# Patient Record
Sex: Female | Born: 1965 | Race: White | Hispanic: Yes | Marital: Married | Smoking: Never smoker
Health system: Southern US, Community
[De-identification: ages and names within clinical notes are randomized; demographics above are authoritative.]

## PROBLEM LIST (undated history)

## (undated) DIAGNOSIS — K2970 Gastritis, unspecified, without bleeding: Secondary | ICD-10-CM

## (undated) DIAGNOSIS — M6530 Trigger finger, unspecified finger: Secondary | ICD-10-CM

## (undated) DIAGNOSIS — H8109 Meniere's disease, unspecified ear: Secondary | ICD-10-CM

## (undated) DIAGNOSIS — K760 Fatty (change of) liver, not elsewhere classified: Secondary | ICD-10-CM

## (undated) DIAGNOSIS — I8390 Asymptomatic varicose veins of unspecified lower extremity: Secondary | ICD-10-CM

## (undated) DIAGNOSIS — F410 Panic disorder [episodic paroxysmal anxiety] without agoraphobia: Secondary | ICD-10-CM

## (undated) DIAGNOSIS — E7800 Pure hypercholesterolemia, unspecified: Secondary | ICD-10-CM

## (undated) DIAGNOSIS — K219 Gastro-esophageal reflux disease without esophagitis: Secondary | ICD-10-CM

## (undated) DIAGNOSIS — M2170 Unequal limb length (acquired), unspecified site: Secondary | ICD-10-CM

## (undated) DIAGNOSIS — I10 Essential (primary) hypertension: Secondary | ICD-10-CM

## (undated) DIAGNOSIS — R6889 Other general symptoms and signs: Secondary | ICD-10-CM

## (undated) DIAGNOSIS — H8110 Benign paroxysmal vertigo, unspecified ear: Secondary | ICD-10-CM

## (undated) DIAGNOSIS — H9190 Unspecified hearing loss, unspecified ear: Secondary | ICD-10-CM

## (undated) DIAGNOSIS — F419 Anxiety disorder, unspecified: Secondary | ICD-10-CM

## (undated) DIAGNOSIS — E785 Hyperlipidemia, unspecified: Secondary | ICD-10-CM

## (undated) DIAGNOSIS — M2140 Flat foot [pes planus] (acquired), unspecified foot: Secondary | ICD-10-CM

## (undated) DIAGNOSIS — R5383 Other fatigue: Secondary | ICD-10-CM

## (undated) DIAGNOSIS — E119 Type 2 diabetes mellitus without complications: Secondary | ICD-10-CM

## (undated) DIAGNOSIS — M722 Plantar fascial fibromatosis: Secondary | ICD-10-CM

## (undated) DIAGNOSIS — D179 Benign lipomatous neoplasm, unspecified: Secondary | ICD-10-CM

## (undated) DIAGNOSIS — F411 Generalized anxiety disorder: Secondary | ICD-10-CM

## (undated) DIAGNOSIS — N926 Irregular menstruation, unspecified: Secondary | ICD-10-CM

## (undated) DIAGNOSIS — K430 Incisional hernia with obstruction, without gangrene: Secondary | ICD-10-CM

## (undated) DIAGNOSIS — R42 Dizziness and giddiness: Secondary | ICD-10-CM

## (undated) DIAGNOSIS — D500 Iron deficiency anemia secondary to blood loss (chronic): Secondary | ICD-10-CM

## (undated) DIAGNOSIS — B351 Tinea unguium: Secondary | ICD-10-CM

## (undated) DIAGNOSIS — N920 Excessive and frequent menstruation with regular cycle: Secondary | ICD-10-CM

## (undated) DIAGNOSIS — L089 Local infection of the skin and subcutaneous tissue, unspecified: Secondary | ICD-10-CM

## (undated) DIAGNOSIS — M5431 Sciatica, right side: Secondary | ICD-10-CM

## (undated) DIAGNOSIS — M2550 Pain in unspecified joint: Secondary | ICD-10-CM

## (undated) DIAGNOSIS — E1369 Other specified diabetes mellitus with other specified complication: Secondary | ICD-10-CM

## (undated) HISTORY — DX: Other fatigue: R53.83

## (undated) HISTORY — DX: Fatty (change of) liver, not elsewhere classified: K76.0

## (undated) HISTORY — DX: Other specified diabetes mellitus with other specified complication: E13.69

## (undated) HISTORY — DX: Anxiety disorder, unspecified: F41.9

## (undated) HISTORY — DX: Unspecified hearing loss, unspecified ear: H91.90

## (undated) HISTORY — DX: Panic disorder (episodic paroxysmal anxiety): F41.0

## (undated) HISTORY — DX: Other general symptoms and signs: R68.89

## (undated) HISTORY — DX: Sciatica, right side: M54.31

## (undated) HISTORY — DX: Benign paroxysmal vertigo, unspecified ear: H81.10

## (undated) HISTORY — DX: Unequal limb length (acquired), unspecified site: M21.70

## (undated) HISTORY — DX: Gastro-esophageal reflux disease without esophagitis: K21.9

## (undated) HISTORY — DX: Type 2 diabetes mellitus without complications: E11.9

## (undated) HISTORY — DX: Hyperlipidemia, unspecified: E78.5

## (undated) HISTORY — DX: Meniere's disease, unspecified ear: H81.09

## (undated) HISTORY — DX: Excessive and frequent menstruation with regular cycle: N92.0

## (undated) HISTORY — DX: Plantar fascial fibromatosis: M72.2

## (undated) HISTORY — DX: Trigger finger, unspecified finger: M65.30

## (undated) HISTORY — DX: Pure hypercholesterolemia, unspecified: E78.00

## (undated) HISTORY — DX: Irregular menstruation, unspecified: N92.6

## (undated) HISTORY — DX: Generalized anxiety disorder: F41.1

## (undated) HISTORY — DX: Benign lipomatous neoplasm, unspecified: D17.9

## (undated) HISTORY — PX: TUBAL LIGATION: SHX77

## (undated) HISTORY — DX: Flat foot (pes planus) (acquired), unspecified foot: M21.40

## (undated) HISTORY — DX: Dizziness and giddiness: R42

## (undated) HISTORY — DX: Incisional hernia with obstruction, without gangrene: K43.0

## (undated) HISTORY — DX: Local infection of the skin and subcutaneous tissue, unspecified: L08.9

## (undated) HISTORY — DX: Asymptomatic varicose veins of unspecified lower extremity: I83.90

## (undated) HISTORY — DX: Pain in unspecified joint: M25.50

## (undated) HISTORY — DX: Iron deficiency anemia secondary to blood loss (chronic): D50.0

## (undated) HISTORY — DX: Tinea unguium: B35.1

---

## 1998-06-08 ENCOUNTER — Other Ambulatory Visit: Admission: RE | Admit: 1998-06-08 | Discharge: 1998-06-08 | Payer: Self-pay | Admitting: Obstetrics and Gynecology

## 1998-09-30 ENCOUNTER — Emergency Department (HOSPITAL_COMMUNITY): Admission: EM | Admit: 1998-09-30 | Discharge: 1998-09-30 | Payer: Self-pay | Admitting: Emergency Medicine

## 1999-01-11 ENCOUNTER — Inpatient Hospital Stay (HOSPITAL_COMMUNITY): Admission: AD | Admit: 1999-01-11 | Discharge: 1999-01-16 | Payer: Self-pay | Admitting: Obstetrics & Gynecology

## 1999-01-17 ENCOUNTER — Inpatient Hospital Stay (HOSPITAL_COMMUNITY): Admission: AD | Admit: 1999-01-17 | Discharge: 1999-01-17 | Payer: Self-pay | Admitting: Obstetrics & Gynecology

## 1999-01-18 ENCOUNTER — Inpatient Hospital Stay (HOSPITAL_COMMUNITY): Admission: AD | Admit: 1999-01-18 | Discharge: 1999-01-18 | Payer: Self-pay | Admitting: Obstetrics

## 1999-01-22 ENCOUNTER — Inpatient Hospital Stay (HOSPITAL_COMMUNITY): Admission: AD | Admit: 1999-01-22 | Discharge: 1999-01-22 | Payer: Self-pay | Admitting: *Deleted

## 1999-01-23 ENCOUNTER — Inpatient Hospital Stay (HOSPITAL_COMMUNITY): Admission: AD | Admit: 1999-01-23 | Discharge: 1999-01-23 | Payer: Self-pay | Admitting: *Deleted

## 1999-01-25 ENCOUNTER — Encounter (HOSPITAL_COMMUNITY): Admission: AD | Admit: 1999-01-25 | Discharge: 1999-04-25 | Payer: Self-pay | Admitting: *Deleted

## 1999-01-31 ENCOUNTER — Inpatient Hospital Stay (HOSPITAL_COMMUNITY): Admission: AD | Admit: 1999-01-31 | Discharge: 1999-01-31 | Payer: Self-pay | Admitting: Obstetrics

## 1999-10-19 ENCOUNTER — Emergency Department (HOSPITAL_COMMUNITY): Admission: EM | Admit: 1999-10-19 | Discharge: 1999-10-19 | Payer: Self-pay | Admitting: Emergency Medicine

## 2002-05-16 ENCOUNTER — Emergency Department (HOSPITAL_COMMUNITY): Admission: EM | Admit: 2002-05-16 | Discharge: 2002-05-16 | Payer: Self-pay | Admitting: Emergency Medicine

## 2002-05-16 ENCOUNTER — Encounter: Payer: Self-pay | Admitting: Emergency Medicine

## 2002-05-20 ENCOUNTER — Encounter: Admission: RE | Admit: 2002-05-20 | Discharge: 2002-05-20 | Payer: Self-pay | Admitting: Internal Medicine

## 2002-05-27 ENCOUNTER — Encounter: Admission: RE | Admit: 2002-05-27 | Discharge: 2002-05-27 | Payer: Self-pay | Admitting: Internal Medicine

## 2002-06-18 ENCOUNTER — Encounter: Admission: RE | Admit: 2002-06-18 | Discharge: 2002-06-18 | Payer: Self-pay | Admitting: Internal Medicine

## 2002-06-26 ENCOUNTER — Encounter: Admission: RE | Admit: 2002-06-26 | Discharge: 2002-06-26 | Payer: Self-pay | Admitting: Internal Medicine

## 2002-07-08 ENCOUNTER — Encounter: Admission: RE | Admit: 2002-07-08 | Discharge: 2002-10-06 | Payer: Self-pay | Admitting: *Deleted

## 2002-07-08 ENCOUNTER — Encounter: Admission: RE | Admit: 2002-07-08 | Discharge: 2002-07-08 | Payer: Self-pay | Admitting: Internal Medicine

## 2002-08-28 ENCOUNTER — Encounter: Admission: RE | Admit: 2002-08-28 | Discharge: 2002-08-28 | Payer: Self-pay | Admitting: Internal Medicine

## 2002-08-28 ENCOUNTER — Encounter: Admission: RE | Admit: 2002-08-28 | Discharge: 2002-08-28 | Payer: Self-pay | Admitting: *Deleted

## 2002-09-02 ENCOUNTER — Ambulatory Visit (HOSPITAL_COMMUNITY): Admission: RE | Admit: 2002-09-02 | Discharge: 2002-09-02 | Payer: Self-pay | Admitting: *Deleted

## 2002-09-04 ENCOUNTER — Encounter: Admission: RE | Admit: 2002-09-04 | Discharge: 2002-09-04 | Payer: Self-pay | Admitting: *Deleted

## 2002-09-10 ENCOUNTER — Ambulatory Visit (HOSPITAL_COMMUNITY): Admission: RE | Admit: 2002-09-10 | Discharge: 2002-09-10 | Payer: Self-pay | Admitting: *Deleted

## 2002-09-11 ENCOUNTER — Encounter: Admission: RE | Admit: 2002-09-11 | Discharge: 2002-09-11 | Payer: Self-pay | Admitting: *Deleted

## 2002-09-18 ENCOUNTER — Encounter: Admission: RE | Admit: 2002-09-18 | Discharge: 2002-09-18 | Payer: Self-pay | Admitting: *Deleted

## 2002-09-18 ENCOUNTER — Encounter: Admission: RE | Admit: 2002-09-18 | Discharge: 2002-12-17 | Payer: Self-pay | Admitting: *Deleted

## 2002-09-23 ENCOUNTER — Ambulatory Visit (HOSPITAL_COMMUNITY): Admission: RE | Admit: 2002-09-23 | Discharge: 2002-09-23 | Payer: Self-pay | Admitting: *Deleted

## 2002-09-25 ENCOUNTER — Encounter: Admission: RE | Admit: 2002-09-25 | Discharge: 2002-09-25 | Payer: Self-pay | Admitting: *Deleted

## 2002-10-09 ENCOUNTER — Encounter: Admission: RE | Admit: 2002-10-09 | Discharge: 2002-10-09 | Payer: Self-pay | Admitting: *Deleted

## 2002-10-16 ENCOUNTER — Encounter: Admission: RE | Admit: 2002-10-16 | Discharge: 2002-10-16 | Payer: Self-pay | Admitting: *Deleted

## 2002-11-01 ENCOUNTER — Inpatient Hospital Stay (HOSPITAL_COMMUNITY): Admission: AD | Admit: 2002-11-01 | Discharge: 2002-11-01 | Payer: Self-pay | Admitting: Obstetrics and Gynecology

## 2002-11-06 ENCOUNTER — Encounter: Admission: RE | Admit: 2002-11-06 | Discharge: 2002-11-06 | Payer: Self-pay | Admitting: *Deleted

## 2002-11-20 ENCOUNTER — Encounter: Admission: RE | Admit: 2002-11-20 | Discharge: 2002-11-20 | Payer: Self-pay | Admitting: *Deleted

## 2002-11-28 ENCOUNTER — Encounter: Admission: RE | Admit: 2002-11-28 | Discharge: 2002-11-28 | Payer: Self-pay | Admitting: *Deleted

## 2002-12-03 ENCOUNTER — Ambulatory Visit (HOSPITAL_COMMUNITY): Admission: RE | Admit: 2002-12-03 | Discharge: 2002-12-03 | Payer: Self-pay | Admitting: Obstetrics and Gynecology

## 2002-12-05 ENCOUNTER — Encounter: Admission: RE | Admit: 2002-12-05 | Discharge: 2002-12-05 | Payer: Self-pay | Admitting: *Deleted

## 2002-12-12 ENCOUNTER — Encounter: Admission: RE | Admit: 2002-12-12 | Discharge: 2002-12-12 | Payer: Self-pay | Admitting: *Deleted

## 2002-12-19 ENCOUNTER — Encounter: Admission: RE | Admit: 2002-12-19 | Discharge: 2002-12-19 | Payer: Self-pay | Admitting: *Deleted

## 2002-12-26 ENCOUNTER — Ambulatory Visit (HOSPITAL_COMMUNITY): Admission: RE | Admit: 2002-12-26 | Discharge: 2002-12-26 | Payer: Self-pay | Admitting: *Deleted

## 2002-12-26 ENCOUNTER — Encounter: Admission: RE | Admit: 2002-12-26 | Discharge: 2002-12-26 | Payer: Self-pay | Admitting: *Deleted

## 2003-01-02 ENCOUNTER — Encounter: Admission: RE | Admit: 2003-01-02 | Discharge: 2003-01-02 | Payer: Self-pay | Admitting: *Deleted

## 2003-01-09 ENCOUNTER — Encounter: Admission: RE | Admit: 2003-01-09 | Discharge: 2003-01-09 | Payer: Self-pay | Admitting: *Deleted

## 2003-01-09 ENCOUNTER — Encounter (HOSPITAL_COMMUNITY): Admission: RE | Admit: 2003-01-09 | Discharge: 2003-02-03 | Payer: Self-pay | Admitting: Family Medicine

## 2003-01-16 ENCOUNTER — Encounter: Admission: RE | Admit: 2003-01-16 | Discharge: 2003-01-16 | Payer: Self-pay | Admitting: *Deleted

## 2003-01-23 ENCOUNTER — Encounter: Admission: RE | Admit: 2003-01-23 | Discharge: 2003-01-23 | Payer: Self-pay | Admitting: *Deleted

## 2003-01-28 ENCOUNTER — Encounter: Payer: Self-pay | Admitting: *Deleted

## 2003-01-28 ENCOUNTER — Encounter: Admission: RE | Admit: 2003-01-28 | Discharge: 2003-01-28 | Payer: Self-pay | Admitting: *Deleted

## 2003-01-30 ENCOUNTER — Encounter: Admission: RE | Admit: 2003-01-30 | Discharge: 2003-01-30 | Payer: Self-pay | Admitting: *Deleted

## 2003-02-01 ENCOUNTER — Inpatient Hospital Stay (HOSPITAL_COMMUNITY): Admission: AD | Admit: 2003-02-01 | Discharge: 2003-02-01 | Payer: Self-pay | Admitting: Obstetrics and Gynecology

## 2003-02-04 ENCOUNTER — Inpatient Hospital Stay (HOSPITAL_COMMUNITY): Admission: AD | Admit: 2003-02-04 | Discharge: 2003-02-07 | Payer: Self-pay | Admitting: Obstetrics and Gynecology

## 2003-02-10 ENCOUNTER — Encounter: Payer: Self-pay | Admitting: Obstetrics and Gynecology

## 2003-02-10 ENCOUNTER — Inpatient Hospital Stay (HOSPITAL_COMMUNITY): Admission: AD | Admit: 2003-02-10 | Discharge: 2003-02-10 | Payer: Self-pay | Admitting: Obstetrics and Gynecology

## 2003-10-23 ENCOUNTER — Encounter: Admission: RE | Admit: 2003-10-23 | Discharge: 2003-10-23 | Payer: Self-pay | Admitting: Internal Medicine

## 2003-11-13 ENCOUNTER — Encounter: Admission: RE | Admit: 2003-11-13 | Discharge: 2003-11-13 | Payer: Self-pay | Admitting: Internal Medicine

## 2003-12-01 ENCOUNTER — Encounter: Admission: RE | Admit: 2003-12-01 | Discharge: 2003-12-01 | Payer: Self-pay | Admitting: Internal Medicine

## 2003-12-04 ENCOUNTER — Encounter: Admission: RE | Admit: 2003-12-04 | Discharge: 2003-12-04 | Payer: Self-pay | Admitting: Family Medicine

## 2003-12-10 ENCOUNTER — Inpatient Hospital Stay (HOSPITAL_COMMUNITY): Admission: RE | Admit: 2003-12-10 | Discharge: 2003-12-17 | Payer: Self-pay | Admitting: Family Medicine

## 2003-12-10 ENCOUNTER — Encounter: Admission: RE | Admit: 2003-12-10 | Discharge: 2003-12-10 | Payer: Self-pay | Admitting: *Deleted

## 2003-12-24 ENCOUNTER — Encounter: Admission: RE | Admit: 2003-12-24 | Discharge: 2003-12-24 | Payer: Self-pay | Admitting: *Deleted

## 2004-01-07 ENCOUNTER — Encounter: Admission: RE | Admit: 2004-01-07 | Discharge: 2004-01-07 | Payer: Self-pay | Admitting: *Deleted

## 2004-01-13 ENCOUNTER — Encounter: Admission: RE | Admit: 2004-01-13 | Discharge: 2004-04-12 | Payer: Self-pay | Admitting: *Deleted

## 2004-01-21 ENCOUNTER — Encounter: Admission: RE | Admit: 2004-01-21 | Discharge: 2004-01-21 | Payer: Self-pay | Admitting: *Deleted

## 2004-02-04 ENCOUNTER — Encounter: Admission: RE | Admit: 2004-02-04 | Discharge: 2004-02-04 | Payer: Self-pay | Admitting: *Deleted

## 2004-02-18 ENCOUNTER — Encounter: Admission: RE | Admit: 2004-02-18 | Discharge: 2004-02-18 | Payer: Self-pay | Admitting: *Deleted

## 2004-02-26 ENCOUNTER — Encounter: Admission: RE | Admit: 2004-02-26 | Discharge: 2004-02-26 | Payer: Self-pay | Admitting: *Deleted

## 2004-03-03 ENCOUNTER — Encounter: Admission: RE | Admit: 2004-03-03 | Discharge: 2004-03-03 | Payer: Self-pay | Admitting: *Deleted

## 2004-03-16 ENCOUNTER — Ambulatory Visit (HOSPITAL_COMMUNITY): Admission: RE | Admit: 2004-03-16 | Discharge: 2004-03-16 | Payer: Self-pay | Admitting: Obstetrics and Gynecology

## 2004-03-17 ENCOUNTER — Encounter: Admission: RE | Admit: 2004-03-17 | Discharge: 2004-03-17 | Payer: Self-pay | Admitting: *Deleted

## 2004-03-31 ENCOUNTER — Encounter: Admission: RE | Admit: 2004-03-31 | Discharge: 2004-03-31 | Payer: Self-pay | Admitting: *Deleted

## 2004-04-07 ENCOUNTER — Encounter: Admission: RE | Admit: 2004-04-07 | Discharge: 2004-04-07 | Payer: Self-pay | Admitting: Obstetrics and Gynecology

## 2004-04-14 ENCOUNTER — Encounter: Admission: RE | Admit: 2004-04-14 | Discharge: 2004-04-14 | Payer: Self-pay | Admitting: *Deleted

## 2004-04-17 ENCOUNTER — Inpatient Hospital Stay (HOSPITAL_COMMUNITY): Admission: AD | Admit: 2004-04-17 | Discharge: 2004-04-17 | Payer: Self-pay | Admitting: Obstetrics and Gynecology

## 2004-04-21 ENCOUNTER — Encounter: Admission: RE | Admit: 2004-04-21 | Discharge: 2004-04-21 | Payer: Self-pay | Admitting: *Deleted

## 2004-05-05 ENCOUNTER — Ambulatory Visit (HOSPITAL_COMMUNITY): Admission: RE | Admit: 2004-05-05 | Discharge: 2004-05-05 | Payer: Self-pay | Admitting: *Deleted

## 2004-05-05 ENCOUNTER — Encounter: Admission: RE | Admit: 2004-05-05 | Discharge: 2004-05-05 | Payer: Self-pay | Admitting: *Deleted

## 2004-05-12 ENCOUNTER — Encounter: Admission: RE | Admit: 2004-05-12 | Discharge: 2004-05-12 | Payer: Self-pay | Admitting: *Deleted

## 2004-05-19 ENCOUNTER — Encounter: Admission: RE | Admit: 2004-05-19 | Discharge: 2004-05-19 | Payer: Self-pay | Admitting: *Deleted

## 2004-05-26 ENCOUNTER — Encounter: Admission: RE | Admit: 2004-05-26 | Discharge: 2004-05-26 | Payer: Self-pay | Admitting: *Deleted

## 2004-05-31 ENCOUNTER — Ambulatory Visit (HOSPITAL_COMMUNITY): Admission: RE | Admit: 2004-05-31 | Discharge: 2004-05-31 | Payer: Self-pay | Admitting: *Deleted

## 2004-06-02 ENCOUNTER — Encounter: Admission: RE | Admit: 2004-06-02 | Discharge: 2004-06-02 | Payer: Self-pay | Admitting: *Deleted

## 2004-06-09 ENCOUNTER — Encounter: Admission: RE | Admit: 2004-06-09 | Discharge: 2004-06-09 | Payer: Self-pay | Admitting: *Deleted

## 2004-06-09 ENCOUNTER — Inpatient Hospital Stay (HOSPITAL_COMMUNITY): Admission: AD | Admit: 2004-06-09 | Discharge: 2004-06-18 | Payer: Self-pay | Admitting: Obstetrics and Gynecology

## 2004-06-14 ENCOUNTER — Encounter (INDEPENDENT_AMBULATORY_CARE_PROVIDER_SITE_OTHER): Payer: Self-pay | Admitting: *Deleted

## 2004-06-21 ENCOUNTER — Inpatient Hospital Stay (HOSPITAL_COMMUNITY): Admission: AD | Admit: 2004-06-21 | Discharge: 2004-06-21 | Payer: Self-pay | Admitting: Obstetrics and Gynecology

## 2004-06-26 ENCOUNTER — Inpatient Hospital Stay (HOSPITAL_COMMUNITY): Admission: AD | Admit: 2004-06-26 | Discharge: 2004-06-26 | Payer: Self-pay | Admitting: Gynecology

## 2004-06-26 ENCOUNTER — Ambulatory Visit: Payer: Self-pay | Admitting: Family Medicine

## 2004-07-06 ENCOUNTER — Ambulatory Visit: Payer: Self-pay | Admitting: Family Medicine

## 2004-08-05 ENCOUNTER — Ambulatory Visit: Payer: Self-pay | Admitting: Family Medicine

## 2004-09-21 ENCOUNTER — Ambulatory Visit: Payer: Self-pay | Admitting: Family Medicine

## 2004-10-04 ENCOUNTER — Ambulatory Visit: Payer: Self-pay | Admitting: Sports Medicine

## 2004-10-20 ENCOUNTER — Ambulatory Visit: Payer: Self-pay | Admitting: Sports Medicine

## 2004-11-25 ENCOUNTER — Ambulatory Visit: Payer: Self-pay | Admitting: Family Medicine

## 2004-12-28 ENCOUNTER — Ambulatory Visit: Payer: Self-pay | Admitting: Sports Medicine

## 2005-01-08 ENCOUNTER — Emergency Department (HOSPITAL_COMMUNITY): Admission: EM | Admit: 2005-01-08 | Discharge: 2005-01-08 | Payer: Self-pay | Admitting: Emergency Medicine

## 2005-01-11 ENCOUNTER — Ambulatory Visit: Payer: Self-pay | Admitting: Family Medicine

## 2005-01-19 ENCOUNTER — Ambulatory Visit: Payer: Self-pay | Admitting: Family Medicine

## 2005-01-27 ENCOUNTER — Ambulatory Visit: Payer: Self-pay | Admitting: Family Medicine

## 2005-02-23 ENCOUNTER — Ambulatory Visit: Payer: Self-pay | Admitting: Family Medicine

## 2005-04-04 ENCOUNTER — Ambulatory Visit: Payer: Self-pay | Admitting: Sports Medicine

## 2005-04-20 ENCOUNTER — Ambulatory Visit: Payer: Self-pay | Admitting: Family Medicine

## 2005-04-22 ENCOUNTER — Emergency Department (HOSPITAL_COMMUNITY): Admission: EM | Admit: 2005-04-22 | Discharge: 2005-04-22 | Payer: Self-pay | Admitting: *Deleted

## 2005-05-10 ENCOUNTER — Ambulatory Visit: Payer: Self-pay | Admitting: Family Medicine

## 2005-05-17 ENCOUNTER — Ambulatory Visit (HOSPITAL_COMMUNITY): Admission: RE | Admit: 2005-05-17 | Discharge: 2005-05-17 | Payer: Self-pay | Admitting: Vascular Surgery

## 2005-06-13 ENCOUNTER — Ambulatory Visit: Payer: Self-pay | Admitting: Family Medicine

## 2005-10-11 ENCOUNTER — Ambulatory Visit: Payer: Self-pay | Admitting: Family Medicine

## 2005-10-14 ENCOUNTER — Ambulatory Visit (HOSPITAL_COMMUNITY): Admission: RE | Admit: 2005-10-14 | Discharge: 2005-10-14 | Payer: Self-pay | Admitting: Family Medicine

## 2005-10-14 ENCOUNTER — Ambulatory Visit: Payer: Self-pay | Admitting: Family Medicine

## 2005-10-31 HISTORY — PX: UMBILICAL HERNIA REPAIR: SHX196

## 2005-10-31 HISTORY — PX: SMALL INTESTINE SURGERY: SHX150

## 2005-11-29 ENCOUNTER — Ambulatory Visit: Payer: Self-pay | Admitting: Family Medicine

## 2005-11-30 ENCOUNTER — Observation Stay (HOSPITAL_COMMUNITY): Admission: EM | Admit: 2005-11-30 | Discharge: 2005-12-01 | Payer: Self-pay | Admitting: Emergency Medicine

## 2005-12-01 ENCOUNTER — Encounter (INDEPENDENT_AMBULATORY_CARE_PROVIDER_SITE_OTHER): Payer: Self-pay | Admitting: *Deleted

## 2005-12-09 ENCOUNTER — Ambulatory Visit: Payer: Self-pay | Admitting: Family Medicine

## 2006-01-23 ENCOUNTER — Ambulatory Visit: Payer: Self-pay | Admitting: Family Medicine

## 2006-01-25 ENCOUNTER — Ambulatory Visit: Payer: Self-pay | Admitting: Family Medicine

## 2006-02-08 ENCOUNTER — Ambulatory Visit (HOSPITAL_COMMUNITY): Admission: RE | Admit: 2006-02-08 | Discharge: 2006-02-08 | Payer: Self-pay | Admitting: Family Medicine

## 2006-04-12 ENCOUNTER — Emergency Department (HOSPITAL_COMMUNITY): Admission: EM | Admit: 2006-04-12 | Discharge: 2006-04-12 | Payer: Self-pay | Admitting: Emergency Medicine

## 2006-06-21 ENCOUNTER — Ambulatory Visit: Payer: Self-pay | Admitting: Family Medicine

## 2006-07-26 ENCOUNTER — Emergency Department (HOSPITAL_COMMUNITY): Admission: EM | Admit: 2006-07-26 | Discharge: 2006-07-27 | Payer: Self-pay | Admitting: Emergency Medicine

## 2006-08-08 ENCOUNTER — Ambulatory Visit: Payer: Self-pay | Admitting: Family Medicine

## 2006-09-13 ENCOUNTER — Ambulatory Visit: Payer: Self-pay | Admitting: Family Medicine

## 2006-09-22 ENCOUNTER — Inpatient Hospital Stay (HOSPITAL_COMMUNITY): Admission: EM | Admit: 2006-09-22 | Discharge: 2006-09-26 | Payer: Self-pay | Admitting: Emergency Medicine

## 2006-09-22 ENCOUNTER — Encounter (INDEPENDENT_AMBULATORY_CARE_PROVIDER_SITE_OTHER): Payer: Self-pay | Admitting: Specialist

## 2006-10-06 ENCOUNTER — Encounter: Admission: RE | Admit: 2006-10-06 | Discharge: 2006-10-06 | Payer: Self-pay | Admitting: General Surgery

## 2006-10-06 ENCOUNTER — Ambulatory Visit: Payer: Self-pay | Admitting: Family Medicine

## 2006-10-11 ENCOUNTER — Ambulatory Visit: Payer: Self-pay | Admitting: Family Medicine

## 2006-10-13 ENCOUNTER — Ambulatory Visit: Payer: Self-pay | Admitting: Sports Medicine

## 2006-10-18 ENCOUNTER — Emergency Department (HOSPITAL_COMMUNITY): Admission: EM | Admit: 2006-10-18 | Discharge: 2006-10-18 | Payer: Self-pay | Admitting: Emergency Medicine

## 2006-10-19 ENCOUNTER — Ambulatory Visit (HOSPITAL_COMMUNITY): Admission: RE | Admit: 2006-10-19 | Discharge: 2006-10-19 | Payer: Self-pay | Admitting: Sports Medicine

## 2006-10-19 ENCOUNTER — Ambulatory Visit: Payer: Self-pay | Admitting: Family Medicine

## 2006-11-08 ENCOUNTER — Ambulatory Visit: Payer: Self-pay | Admitting: Family Medicine

## 2006-12-28 DIAGNOSIS — D500 Iron deficiency anemia secondary to blood loss (chronic): Secondary | ICD-10-CM

## 2006-12-28 DIAGNOSIS — F411 Generalized anxiety disorder: Secondary | ICD-10-CM

## 2006-12-28 DIAGNOSIS — I10 Essential (primary) hypertension: Secondary | ICD-10-CM

## 2006-12-28 DIAGNOSIS — E1159 Type 2 diabetes mellitus with other circulatory complications: Secondary | ICD-10-CM

## 2006-12-28 HISTORY — DX: Iron deficiency anemia secondary to blood loss (chronic): D50.0

## 2006-12-28 HISTORY — DX: Generalized anxiety disorder: F41.1

## 2006-12-29 ENCOUNTER — Encounter (INDEPENDENT_AMBULATORY_CARE_PROVIDER_SITE_OTHER): Payer: Self-pay | Admitting: *Deleted

## 2007-01-17 ENCOUNTER — Ambulatory Visit: Payer: Self-pay | Admitting: Sports Medicine

## 2007-01-17 ENCOUNTER — Encounter (INDEPENDENT_AMBULATORY_CARE_PROVIDER_SITE_OTHER): Payer: Self-pay | Admitting: Family Medicine

## 2007-01-17 DIAGNOSIS — E1169 Type 2 diabetes mellitus with other specified complication: Secondary | ICD-10-CM

## 2007-01-17 DIAGNOSIS — R5383 Other fatigue: Secondary | ICD-10-CM

## 2007-01-17 DIAGNOSIS — R42 Dizziness and giddiness: Secondary | ICD-10-CM | POA: Insufficient documentation

## 2007-01-17 DIAGNOSIS — E669 Obesity, unspecified: Secondary | ICD-10-CM | POA: Insufficient documentation

## 2007-01-17 DIAGNOSIS — R5381 Other malaise: Secondary | ICD-10-CM

## 2007-01-17 LAB — CONVERTED CEMR LAB
Albumin: 4.2 g/dL (ref 3.5–5.2)
Alkaline Phosphatase: 103 units/L (ref 39–117)
BUN: 12 mg/dL (ref 6–23)
Chloride: 106 meq/L (ref 96–112)
Creatinine, Ser: 0.76 mg/dL (ref 0.40–1.20)
Potassium: 3.8 meq/L (ref 3.5–5.3)
Retic Count, Absolute: 58.7 (ref 19.0–186.0)
Retic Ct Pct: 1.2 % (ref 0.4–3.1)
Total Bilirubin: 0.4 mg/dL (ref 0.3–1.2)

## 2007-01-25 ENCOUNTER — Encounter (INDEPENDENT_AMBULATORY_CARE_PROVIDER_SITE_OTHER): Payer: Self-pay | Admitting: *Deleted

## 2007-01-25 ENCOUNTER — Ambulatory Visit: Payer: Self-pay | Admitting: Vascular Surgery

## 2007-01-25 ENCOUNTER — Ambulatory Visit (HOSPITAL_COMMUNITY): Admission: RE | Admit: 2007-01-25 | Discharge: 2007-01-25 | Payer: Self-pay | Admitting: Family Medicine

## 2007-01-25 ENCOUNTER — Encounter: Payer: Self-pay | Admitting: Vascular Surgery

## 2007-03-02 ENCOUNTER — Emergency Department (HOSPITAL_COMMUNITY): Admission: EM | Admit: 2007-03-02 | Discharge: 2007-03-02 | Payer: Self-pay | Admitting: Emergency Medicine

## 2007-03-12 ENCOUNTER — Telehealth (INDEPENDENT_AMBULATORY_CARE_PROVIDER_SITE_OTHER): Payer: Self-pay | Admitting: *Deleted

## 2007-03-13 ENCOUNTER — Ambulatory Visit: Payer: Self-pay | Admitting: Family Medicine

## 2007-03-16 ENCOUNTER — Ambulatory Visit (HOSPITAL_COMMUNITY): Admission: RE | Admit: 2007-03-16 | Discharge: 2007-03-16 | Payer: Self-pay | Admitting: Otolaryngology

## 2007-03-30 ENCOUNTER — Encounter (INDEPENDENT_AMBULATORY_CARE_PROVIDER_SITE_OTHER): Payer: Self-pay | Admitting: Family Medicine

## 2007-03-30 ENCOUNTER — Ambulatory Visit: Payer: Self-pay | Admitting: Family Medicine

## 2007-03-30 LAB — CONVERTED CEMR LAB
HCT: 33.2 %
MCV: 74.9 fL
Platelets: 224 10*3/uL
Prothrombin Time: 14.5 s (ref 11.6–15.2)
WBC: 6.1 10*3/uL

## 2007-04-01 ENCOUNTER — Emergency Department (HOSPITAL_COMMUNITY): Admission: EM | Admit: 2007-04-01 | Discharge: 2007-04-02 | Payer: Self-pay | Admitting: Emergency Medicine

## 2007-04-02 ENCOUNTER — Ambulatory Visit: Payer: Self-pay | Admitting: *Deleted

## 2007-04-02 ENCOUNTER — Ambulatory Visit (HOSPITAL_COMMUNITY): Admission: RE | Admit: 2007-04-02 | Discharge: 2007-04-02 | Payer: Self-pay | Admitting: Family Medicine

## 2007-04-13 ENCOUNTER — Encounter (INDEPENDENT_AMBULATORY_CARE_PROVIDER_SITE_OTHER): Payer: Self-pay | Admitting: *Deleted

## 2007-04-19 ENCOUNTER — Ambulatory Visit (HOSPITAL_COMMUNITY): Admission: RE | Admit: 2007-04-19 | Discharge: 2007-04-19 | Payer: Self-pay | Admitting: Otolaryngology

## 2007-05-18 ENCOUNTER — Encounter: Payer: Self-pay | Admitting: Family Medicine

## 2007-06-06 ENCOUNTER — Telehealth (INDEPENDENT_AMBULATORY_CARE_PROVIDER_SITE_OTHER): Payer: Self-pay | Admitting: *Deleted

## 2007-06-08 ENCOUNTER — Ambulatory Visit (HOSPITAL_COMMUNITY): Admission: RE | Admit: 2007-06-08 | Discharge: 2007-06-08 | Payer: Self-pay | Admitting: Family Medicine

## 2007-06-08 ENCOUNTER — Ambulatory Visit: Payer: Self-pay | Admitting: Family Medicine

## 2007-06-08 ENCOUNTER — Encounter: Payer: Self-pay | Admitting: Family Medicine

## 2007-06-13 ENCOUNTER — Encounter: Payer: Self-pay | Admitting: Family Medicine

## 2007-07-06 ENCOUNTER — Ambulatory Visit (HOSPITAL_COMMUNITY): Admission: RE | Admit: 2007-07-06 | Discharge: 2007-07-06 | Payer: Self-pay | Admitting: Otolaryngology

## 2007-07-18 ENCOUNTER — Ambulatory Visit: Payer: Self-pay | Admitting: Family Medicine

## 2007-07-18 LAB — CONVERTED CEMR LAB: Hgb A1c MFr Bld: 6.1 %

## 2007-07-19 ENCOUNTER — Encounter: Payer: Self-pay | Admitting: Family Medicine

## 2007-07-19 LAB — CONVERTED CEMR LAB

## 2007-08-14 ENCOUNTER — Ambulatory Visit (HOSPITAL_COMMUNITY): Admission: RE | Admit: 2007-08-14 | Discharge: 2007-08-14 | Payer: Self-pay | Admitting: Family Medicine

## 2007-08-14 ENCOUNTER — Ambulatory Visit: Payer: Self-pay | Admitting: Family Medicine

## 2007-08-14 ENCOUNTER — Telehealth: Payer: Self-pay | Admitting: *Deleted

## 2007-08-20 ENCOUNTER — Encounter: Payer: Self-pay | Admitting: Family Medicine

## 2007-08-22 ENCOUNTER — Encounter: Payer: Self-pay | Admitting: Family Medicine

## 2007-10-03 ENCOUNTER — Ambulatory Visit: Payer: Self-pay | Admitting: Family Medicine

## 2007-11-05 ENCOUNTER — Ambulatory Visit: Payer: Self-pay | Admitting: Sports Medicine

## 2007-11-07 ENCOUNTER — Ambulatory Visit (HOSPITAL_COMMUNITY): Admission: RE | Admit: 2007-11-07 | Discharge: 2007-11-07 | Payer: Self-pay | Admitting: Family Medicine

## 2007-11-07 ENCOUNTER — Ambulatory Visit: Payer: Self-pay | Admitting: Family Medicine

## 2007-11-13 ENCOUNTER — Encounter: Payer: Self-pay | Admitting: Family Medicine

## 2008-04-01 ENCOUNTER — Encounter: Payer: Self-pay | Admitting: *Deleted

## 2008-04-22 ENCOUNTER — Encounter (INDEPENDENT_AMBULATORY_CARE_PROVIDER_SITE_OTHER): Payer: Self-pay | Admitting: *Deleted

## 2008-04-23 ENCOUNTER — Encounter: Payer: Self-pay | Admitting: Family Medicine

## 2008-05-08 ENCOUNTER — Ambulatory Visit: Payer: Self-pay | Admitting: Family Medicine

## 2008-05-08 LAB — CONVERTED CEMR LAB: Hgb A1c MFr Bld: 6.1 %

## 2008-05-20 ENCOUNTER — Encounter: Payer: Self-pay | Admitting: Family Medicine

## 2008-05-20 ENCOUNTER — Ambulatory Visit: Payer: Self-pay | Admitting: Family Medicine

## 2008-05-21 LAB — CONVERTED CEMR LAB
AST: 15 units/L (ref 0–37)
Alkaline Phosphatase: 83 units/L (ref 39–117)
BUN: 12 mg/dL (ref 6–23)
Calcium: 8.7 mg/dL (ref 8.4–10.5)
Creatinine, Ser: 0.5 mg/dL (ref 0.40–1.20)
HDL: 45 mg/dL (ref >39)
LDL Cholesterol: 106 mg/dL — ABNORMAL HIGH (ref 0–99)
Total Bilirubin: 0.4 mg/dL (ref 0.3–1.2)
Total CHOL/HDL Ratio: 4.2
VLDL: 36 mg/dL (ref 0–40)

## 2008-06-20 ENCOUNTER — Ambulatory Visit: Payer: Self-pay | Admitting: Family Medicine

## 2008-07-09 ENCOUNTER — Encounter: Payer: Self-pay | Admitting: Family Medicine

## 2008-07-11 ENCOUNTER — Encounter: Payer: Self-pay | Admitting: Family Medicine

## 2008-07-11 ENCOUNTER — Ambulatory Visit: Payer: Self-pay | Admitting: Family Medicine

## 2008-07-11 LAB — CONVERTED CEMR LAB
AST: 16 units/L (ref 0–37)
Alkaline Phosphatase: 103 units/L (ref 39–117)
Glucose, Bld: 108 mg/dL — ABNORMAL HIGH (ref 70–99)
Sodium: 140 meq/L (ref 135–145)
Total Bilirubin: 0.5 mg/dL (ref 0.3–1.2)
Total Protein: 7.5 g/dL (ref 6.0–8.3)

## 2008-07-12 ENCOUNTER — Encounter: Payer: Self-pay | Admitting: *Deleted

## 2008-07-15 ENCOUNTER — Telehealth: Payer: Self-pay | Admitting: Family Medicine

## 2008-07-16 ENCOUNTER — Encounter (INDEPENDENT_AMBULATORY_CARE_PROVIDER_SITE_OTHER): Payer: Self-pay | Admitting: Family Medicine

## 2008-07-16 ENCOUNTER — Ambulatory Visit: Payer: Self-pay | Admitting: Family Medicine

## 2008-07-16 LAB — CONVERTED CEMR LAB: H Pylori IgG: NEGATIVE

## 2008-07-23 ENCOUNTER — Encounter: Payer: Self-pay | Admitting: *Deleted

## 2008-08-27 ENCOUNTER — Ambulatory Visit: Payer: Self-pay | Admitting: Family Medicine

## 2008-08-27 ENCOUNTER — Encounter: Payer: Self-pay | Admitting: Family Medicine

## 2008-08-29 ENCOUNTER — Encounter: Payer: Self-pay | Admitting: Family Medicine

## 2008-08-31 ENCOUNTER — Encounter: Payer: Self-pay | Admitting: Family Medicine

## 2008-10-14 ENCOUNTER — Ambulatory Visit: Payer: Self-pay | Admitting: Family Medicine

## 2008-10-14 DIAGNOSIS — Z9889 Other specified postprocedural states: Secondary | ICD-10-CM | POA: Insufficient documentation

## 2008-10-28 IMAGING — CT CT ORBIT/TEMPORAL/IAC WO/W CM
3 of 9 series · 9 of 30 positions shown, 11 images · IV contrast (100 ML OMNI 300)
Comparison: none

CLINICAL DATA: 40 year old female; right ear pain and hearing loss.  
 CT ORBITS/TEMPORAL BONES WITHOUT AND WITH CONTRAST ? 03/16/07:
TECHNIQUE: Coronal and axial CT images were obtained through the maxillofacial region including the facial bones, orbits, and paranasal sinuses both before and during IV contrast administration.
 Contrast:  100 Omnipaque 300 IV.

[Series 14: prone coronal · axial · 0.33mm/px · z∈[+82,+116]mm · 2 of 164 slices shown (1 of 2)]
[im 55/164  bone]
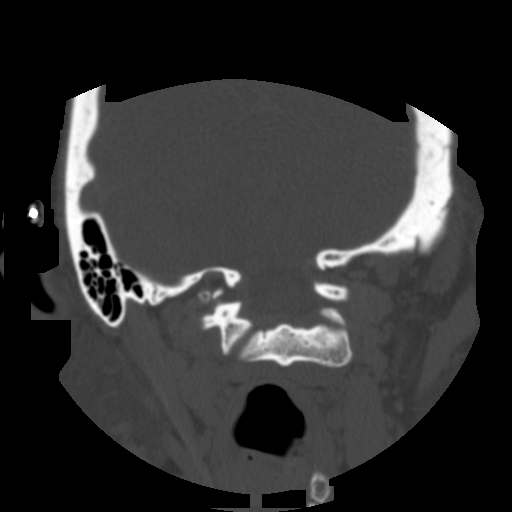
[im 109/164  bone]
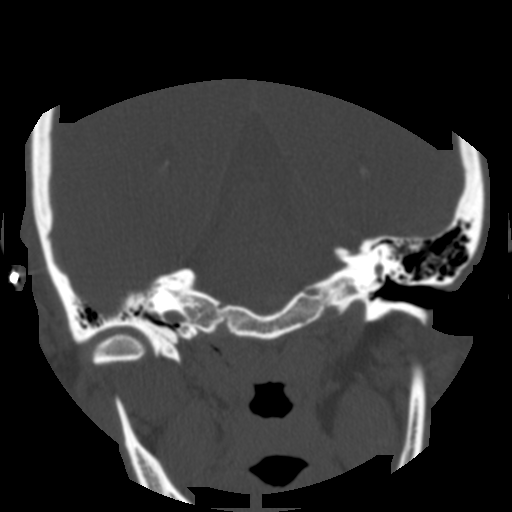

[Series 110: axial · axial · 0.20mm/px · z∈[+96,+116]mm · 2 of 192 slices shown]
[im 64/192  bone]
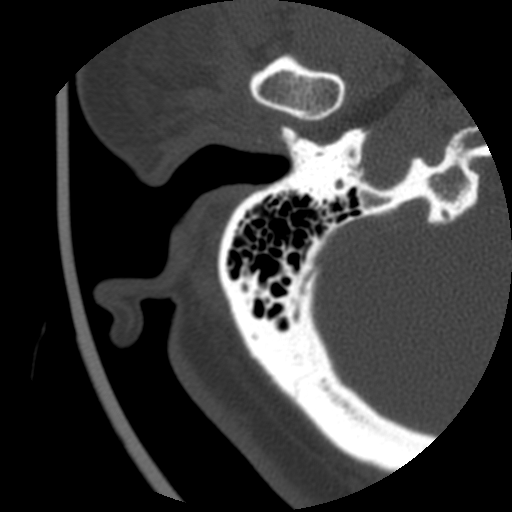
[im 128/192  bone]
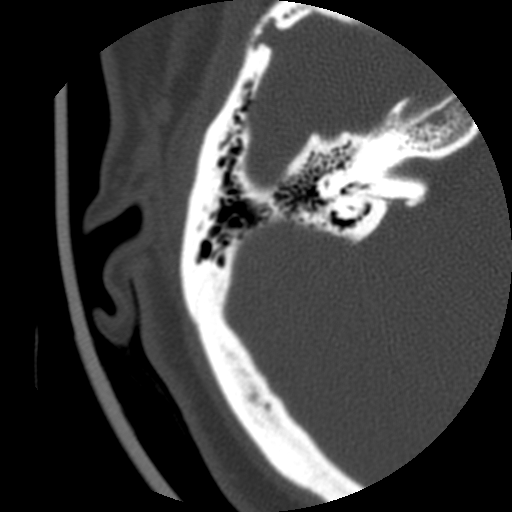

[Series 114: prone coronal · axial · 0.33mm/px · z∈[+59,+127]mm · 5 of 326 slices shown, 7 images (2 of 2)]
[im 55/326  brain]
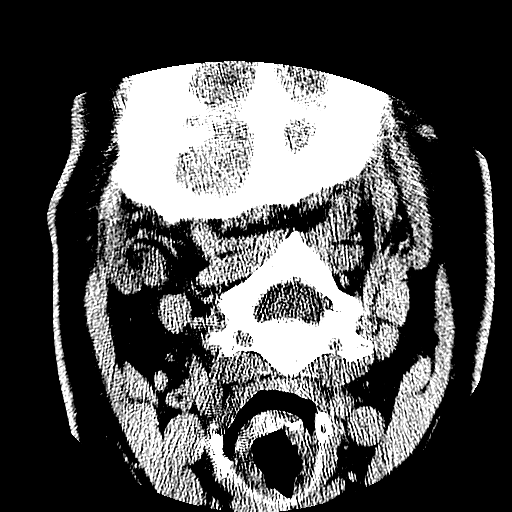
[im 55/326  bone]
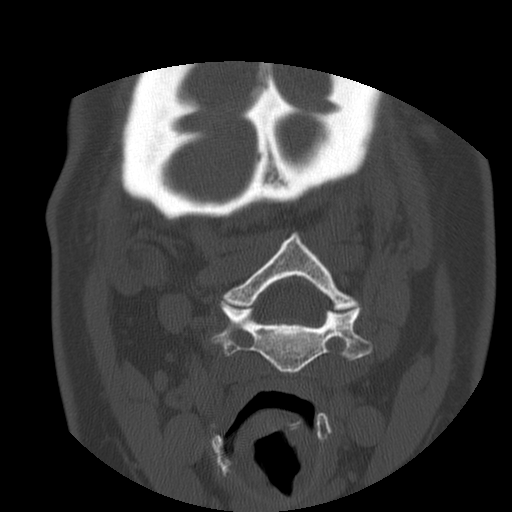
[im 109/326  bone]
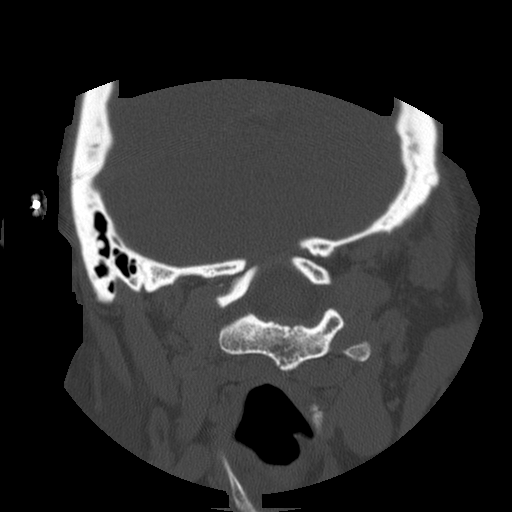
[im 163/326  bone]
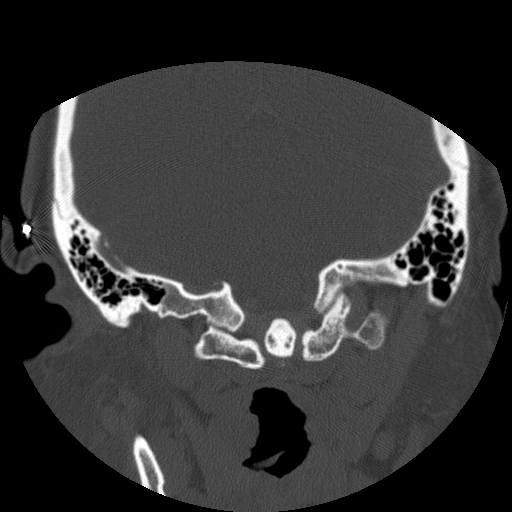
[im 217/326  bone]
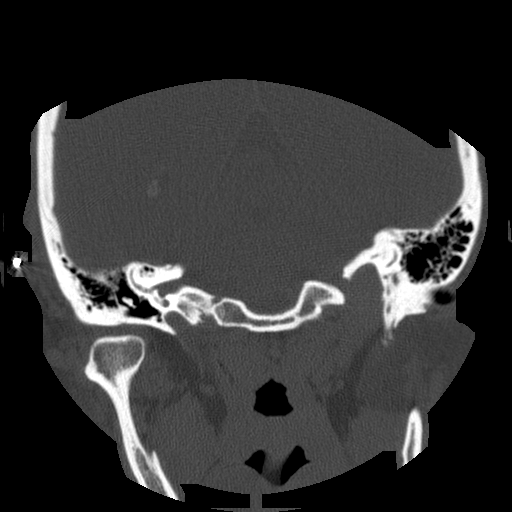
[im 271/326  brain]
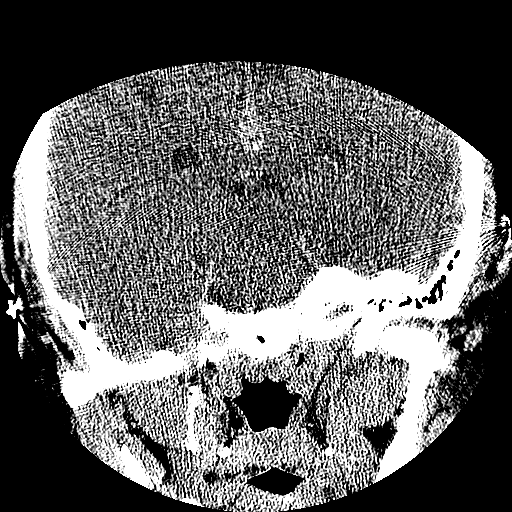
[im 271/326  bone]
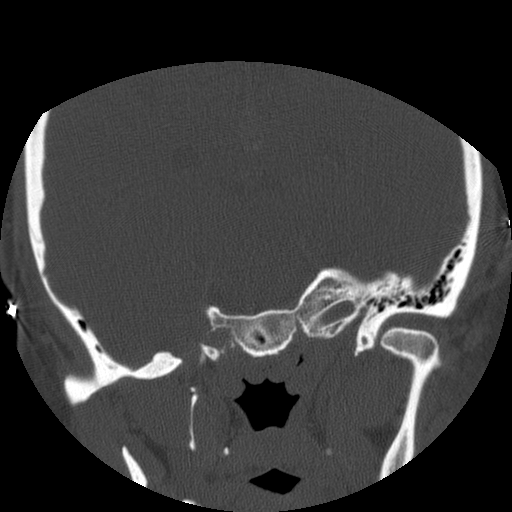

[9 of 30 positions shown; findings below may reference images not displayed]

FINDINGS: The right mastoid air cells are clear.  The internal ear cavity is clear.  The middle ossicles are unremarkable.  
 Imaging of the left temporal bone demonstrates that the mastoid air cells are clear.  The middle ear cavity is within normal limits.  The ossicles are within normal limits.
 Postcontrast images demonstrate no areas of pathologic enhancement.  There are some calcifications within the tritus and along the membranous external auditory canal bilaterally.  Limited imaging of the brain is unremarkable.
IMPRESSION: Negative CT of the temporal bone without and with contrast.

## 2008-10-31 HISTORY — PX: OTHER SURGICAL HISTORY: SHX169

## 2008-12-31 ENCOUNTER — Encounter: Payer: Self-pay | Admitting: *Deleted

## 2008-12-31 ENCOUNTER — Ambulatory Visit: Payer: Self-pay | Admitting: Family Medicine

## 2008-12-31 ENCOUNTER — Encounter: Payer: Self-pay | Admitting: Family Medicine

## 2008-12-31 DIAGNOSIS — H9190 Unspecified hearing loss, unspecified ear: Secondary | ICD-10-CM

## 2008-12-31 HISTORY — DX: Unspecified hearing loss, unspecified ear: H91.90

## 2009-01-06 ENCOUNTER — Telehealth (INDEPENDENT_AMBULATORY_CARE_PROVIDER_SITE_OTHER): Payer: Self-pay | Admitting: *Deleted

## 2009-01-12 ENCOUNTER — Ambulatory Visit: Payer: Self-pay | Admitting: Family Medicine

## 2009-01-12 LAB — CONVERTED CEMR LAB: Hgb A1c MFr Bld: 6.9 %

## 2009-01-14 ENCOUNTER — Encounter: Payer: Self-pay | Admitting: *Deleted

## 2009-01-16 ENCOUNTER — Encounter: Payer: Self-pay | Admitting: *Deleted

## 2009-02-12 ENCOUNTER — Encounter: Payer: Self-pay | Admitting: Family Medicine

## 2009-02-19 ENCOUNTER — Ambulatory Visit (HOSPITAL_COMMUNITY): Admission: RE | Admit: 2009-02-19 | Discharge: 2009-02-19 | Payer: Self-pay | Admitting: Family Medicine

## 2009-02-19 ENCOUNTER — Encounter: Payer: Self-pay | Admitting: Family Medicine

## 2009-03-02 ENCOUNTER — Telehealth: Payer: Self-pay | Admitting: Family Medicine

## 2009-03-03 ENCOUNTER — Ambulatory Visit: Payer: Self-pay | Admitting: Family Medicine

## 2009-03-03 LAB — CONVERTED CEMR LAB: Rapid Strep: POSITIVE

## 2009-03-19 ENCOUNTER — Ambulatory Visit: Payer: Self-pay | Admitting: Family Medicine

## 2009-03-19 ENCOUNTER — Inpatient Hospital Stay (HOSPITAL_COMMUNITY): Admission: EM | Admit: 2009-03-19 | Discharge: 2009-03-19 | Payer: Self-pay | Admitting: Emergency Medicine

## 2009-03-20 ENCOUNTER — Ambulatory Visit: Payer: Self-pay | Admitting: Family Medicine

## 2009-03-20 DIAGNOSIS — E1369 Other specified diabetes mellitus with other specified complication: Secondary | ICD-10-CM | POA: Insufficient documentation

## 2009-03-20 DIAGNOSIS — E7800 Pure hypercholesterolemia, unspecified: Secondary | ICD-10-CM

## 2009-03-20 HISTORY — DX: Other specified diabetes mellitus with other specified complication: E13.69

## 2009-04-08 ENCOUNTER — Ambulatory Visit: Payer: Self-pay | Admitting: Family Medicine

## 2009-04-08 DIAGNOSIS — K219 Gastro-esophageal reflux disease without esophagitis: Secondary | ICD-10-CM

## 2009-04-20 ENCOUNTER — Encounter: Payer: Self-pay | Admitting: Family Medicine

## 2009-04-20 ENCOUNTER — Emergency Department (HOSPITAL_COMMUNITY): Admission: EM | Admit: 2009-04-20 | Discharge: 2009-04-20 | Payer: Self-pay | Admitting: Emergency Medicine

## 2009-04-20 ENCOUNTER — Ambulatory Visit: Payer: Self-pay | Admitting: Cardiology

## 2009-04-22 ENCOUNTER — Ambulatory Visit: Payer: Self-pay | Admitting: Family Medicine

## 2009-04-22 DIAGNOSIS — S90129A Contusion of unspecified lesser toe(s) without damage to nail, initial encounter: Secondary | ICD-10-CM | POA: Insufficient documentation

## 2009-05-01 ENCOUNTER — Ambulatory Visit: Payer: Self-pay | Admitting: Family Medicine

## 2009-05-14 ENCOUNTER — Ambulatory Visit: Payer: Self-pay | Admitting: Family Medicine

## 2009-05-14 ENCOUNTER — Telehealth: Payer: Self-pay | Admitting: Family Medicine

## 2009-06-22 ENCOUNTER — Ambulatory Visit: Payer: Self-pay | Admitting: Family Medicine

## 2009-06-22 ENCOUNTER — Encounter: Payer: Self-pay | Admitting: Family Medicine

## 2009-06-22 ENCOUNTER — Encounter (INDEPENDENT_AMBULATORY_CARE_PROVIDER_SITE_OTHER): Payer: Self-pay | Admitting: *Deleted

## 2009-06-22 LAB — CONVERTED CEMR LAB
MCHC: 31 g/dL (ref 30.0–36.0)
MCV: 77.2 fL — ABNORMAL LOW (ref 78.0–100.0)
Platelets: 227 10*3/uL (ref 150–400)
WBC: 8.1 10*3/uL (ref 4.0–10.5)

## 2009-06-30 ENCOUNTER — Ambulatory Visit: Payer: Self-pay | Admitting: Cardiology

## 2009-07-07 ENCOUNTER — Ambulatory Visit: Payer: Self-pay | Admitting: Family Medicine

## 2009-07-07 DIAGNOSIS — L84 Corns and callosities: Secondary | ICD-10-CM | POA: Insufficient documentation

## 2009-07-07 DIAGNOSIS — R1084 Generalized abdominal pain: Secondary | ICD-10-CM

## 2009-07-07 LAB — CONVERTED CEMR LAB: Hgb A1c MFr Bld: 6.6 %

## 2009-07-08 ENCOUNTER — Ambulatory Visit (HOSPITAL_COMMUNITY): Admission: RE | Admit: 2009-07-08 | Discharge: 2009-07-08 | Payer: Self-pay | Admitting: Family Medicine

## 2009-08-11 ENCOUNTER — Encounter: Payer: Self-pay | Admitting: *Deleted

## 2009-09-10 ENCOUNTER — Telehealth: Payer: Self-pay | Admitting: Family Medicine

## 2009-09-19 ENCOUNTER — Emergency Department (HOSPITAL_COMMUNITY): Admission: EM | Admit: 2009-09-19 | Discharge: 2009-09-19 | Payer: Self-pay | Admitting: Family Medicine

## 2009-09-22 ENCOUNTER — Telehealth: Payer: Self-pay | Admitting: Family Medicine

## 2009-10-13 ENCOUNTER — Ambulatory Visit: Payer: Self-pay | Admitting: Family Medicine

## 2009-11-17 ENCOUNTER — Encounter: Payer: Self-pay | Admitting: *Deleted

## 2009-11-17 ENCOUNTER — Ambulatory Visit: Payer: Self-pay | Admitting: Family Medicine

## 2009-11-17 ENCOUNTER — Emergency Department (HOSPITAL_COMMUNITY): Admission: EM | Admit: 2009-11-17 | Discharge: 2009-11-18 | Payer: Self-pay | Admitting: Emergency Medicine

## 2009-11-17 DIAGNOSIS — R358 Other polyuria: Secondary | ICD-10-CM

## 2009-11-17 LAB — CONVERTED CEMR LAB
Blood in Urine, dipstick: NEGATIVE
Glucose, Urine, Semiquant: NEGATIVE
Nitrite: NEGATIVE
Urobilinogen, UA: 0.2
pH: 5.5

## 2009-11-20 ENCOUNTER — Telehealth: Payer: Self-pay | Admitting: *Deleted

## 2010-01-04 ENCOUNTER — Telehealth: Payer: Self-pay | Admitting: *Deleted

## 2010-04-22 ENCOUNTER — Encounter: Payer: Self-pay | Admitting: Family Medicine

## 2010-05-13 ENCOUNTER — Telehealth: Payer: Self-pay | Admitting: Family Medicine

## 2010-05-18 ENCOUNTER — Encounter: Payer: Self-pay | Admitting: Family Medicine

## 2010-05-18 ENCOUNTER — Telehealth: Payer: Self-pay | Admitting: Family Medicine

## 2010-05-18 ENCOUNTER — Ambulatory Visit: Payer: Self-pay | Admitting: Family Medicine

## 2010-05-18 DIAGNOSIS — R300 Dysuria: Secondary | ICD-10-CM | POA: Insufficient documentation

## 2010-05-18 LAB — CONVERTED CEMR LAB
Hgb A1c MFr Bld: 6.9 %
Nitrite: NEGATIVE
Urobilinogen, UA: 1
WBC Urine, dipstick: NEGATIVE

## 2010-06-11 ENCOUNTER — Encounter: Payer: Self-pay | Admitting: Family Medicine

## 2010-06-11 ENCOUNTER — Other Ambulatory Visit: Admission: RE | Admit: 2010-06-11 | Discharge: 2010-06-11 | Payer: Self-pay | Admitting: Family Medicine

## 2010-06-11 ENCOUNTER — Ambulatory Visit: Payer: Self-pay | Admitting: Family Medicine

## 2010-06-11 LAB — CONVERTED CEMR LAB
ALT: 18 units/L (ref 0–35)
Albumin: 3.6 g/dL (ref 3.5–5.2)
BUN: 12 mg/dL (ref 6–23)
CO2: 29 meq/L (ref 19–32)
Calcium: 9.2 mg/dL (ref 8.4–10.5)
Chloride: 106 meq/L (ref 96–112)
Creatinine, Ser: 0.61 mg/dL (ref 0.40–1.20)
Direct LDL: 105 mg/dL — ABNORMAL HIGH
HCT: 31.1 % — ABNORMAL LOW (ref 36.0–46.0)
Hemoglobin: 9.5 g/dL — ABNORMAL LOW (ref 12.0–15.0)
WBC: 7 10*3/uL (ref 4.0–10.5)

## 2010-07-25 ENCOUNTER — Encounter: Payer: Self-pay | Admitting: Family Medicine

## 2010-10-20 ENCOUNTER — Ambulatory Visit: Payer: Self-pay | Admitting: Family Medicine

## 2010-10-20 ENCOUNTER — Encounter: Payer: Self-pay | Admitting: Family Medicine

## 2010-10-20 DIAGNOSIS — R079 Chest pain, unspecified: Secondary | ICD-10-CM | POA: Insufficient documentation

## 2010-10-20 DIAGNOSIS — R34 Anuria and oliguria: Secondary | ICD-10-CM

## 2010-10-20 DIAGNOSIS — M549 Dorsalgia, unspecified: Secondary | ICD-10-CM | POA: Insufficient documentation

## 2010-10-20 LAB — CONVERTED CEMR LAB
Bilirubin Urine: NEGATIVE
Glucose, Urine, Semiquant: NEGATIVE
Ketones, urine, test strip: NEGATIVE
Protein, U semiquant: NEGATIVE
Urobilinogen, UA: 0.2
pH: 5.5

## 2010-11-11 ENCOUNTER — Telehealth: Payer: Self-pay | Admitting: *Deleted

## 2010-11-20 ENCOUNTER — Encounter: Payer: Self-pay | Admitting: *Deleted

## 2010-11-21 ENCOUNTER — Encounter: Payer: Self-pay | Admitting: Sports Medicine

## 2010-11-30 NOTE — Assessment & Plan Note (Signed)
Summary: hernia pain/[place]/Gutierrez   Vital Signs:  Patient profile:   45 year old female Weight:      241.8 pounds Temp:     98.6 degrees F oral Pulse rate:   67 / minute BP sitting:   117 / 78  (right arm) Cuff size:   large  Vitals Entered By: Arlyss Repress CMA, (November 17, 2009 4:30 PM) CC: Abdominal pain x 1 day. increased urination x 2 weeks. Is Patient Diabetic? No Pain Assessment Patient in pain? yes     Location: abdomen Intensity: 8 Onset of pain  X 1 D   Primary Care Provider:  Eustaquio Boyden  MD  CC:  Abdominal pain x 1 day. increased urination x 2 weeks.Marland Kitchen  History of Present Illness: ABDOMINAL PAIN Location: Peri-umbilical and diffuse Onset: Yesdarday Description: Intense stabbing Modifying factors: Hx of umbilical hernia   Symptoms Nausea/Vomiting: Yes Diarrhea: Yes Constipation: No Melena/BRBPR: No Hematemesis: No Anorexia: No Fever/Chills: Yes (Chills) Jaundice: No Dysuria: No Back pain:No Rash: No Weight loss: No Vaginal bleeding: NO STD exposure: NO   PMH Past Surgeries:  Hx of umbilical hernia repair in 2007 with return of large abdominal wall defect.    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  (ICD-280.9) 8)  Contusion of Toe  (ICD-924.3) 9)  Umbilical Herniorrhaphy, Hx of  (ICD-V45.89) 10)  Gerd  (ICD-530.81) 11)  Hyperlipidemia  (ICD-272.4) 12)  Unspecified Hearing Loss  (ICD-389.9) 13)  Preventive Health Care  (ICD-V70.0) 14)  Obesity, Nos  (ICD-278.00) 15)  Screening For Malignant Neoplasm of The Cervix  (ICD-V76.2) 16)  Gynecological Examination, Routine   (ICD-V72.31) 17)  Anxiety  (ICD-300.00) 18)  Fatigue  (ICD-780.79) 19)  Dizziness, Chronic  (ICD-780.4)  Current Medications (verified): 1)  Bayer Childrens Aspirin 81 Mg Chew (Aspirin) .... Take 1 Tablet By Mouth Once A Day 2)  Lisinopril 20 Mg Tabs (Lisinopril) .Marland Kitchen.. 1 Tablet By Mouth Daily. Label in Spanish 3)  Metformin Hcl 1000 Mg Tabs (Metformin Hcl) .... Daily 4)  Cvs Iron 325 (65 Fe) Mg Tabs (Ferrous Sulfate) .... Take One By Mouth Daily 5)  Simvastatin 80 Mg Tabs (Simvastatin) .... Daily 6)  Metoprolol Tartrate 25 Mg Tabs (Metoprolol Tartrate) .... Take 1/2 Tablet By Mouth Daily For Blood Pressure 7)  Fish Oil  Oil (Fish Oil) .... One Daily 8)  Colace 100 Mg Caps (Docusate Sodium) .... Take One By Mouth Two Times A Day 9)  Ranitidine Hcl 150 Mg Caps (Ranitidine Hcl) .Marland Kitchen.. 1 Cap By Mouth Two Times A Day For Stomach  Allergies (verified): No Known Drug Allergies  Past History:  Past Medical History: Last updated: 07/08/2009 Anxiety with panick attacks Meniere`s syndrome. T2DM x 4 years GERD HLD - LDL 106 2010. Somatic patient Two unit PRBC transfusion (08/31/2006) Neg ETT 12/29/05 Fatty Liver (RUQ ultrasound 06/13/05 and 07/2009) HTN x 4 years  Past Surgical History: Last updated: 07/08/2009 Umbilical hernia repair w/o mesh with small bowel resection and primary closure By Dr. Lindie Spruce - 08/31/2006 C-section x 3 (2000, 2004, 2005) Tubal Ligation ETT 05/2009 - no ischemic changes.  + poor exercise tolerance Abd [place] 07/2009 - no gallstones or inflammation.  +  fatty liver.  Family History: Last updated: 04/18/2009 Brother - HTN.  CVA in his 15s.  Died from MI at age 14  Social History: Last updated: 04/18/2009 Working part time as Sports administrator since 7/06. Pt non smoker/ETOH/drugs.  Risk Factors: Smoking Status: never (11/17/2009)  Review of Systems       Please see HPI  Physical Exam  General:  Vs noted. In pain appearing woman Lungs:  CTABL Heart:  RRR no  MRG Abdomen:  soft, obese, NABS, umbilical scar from hernia repair without mesh, just inferior to this and left is palpable mass, tender to palpation cannot determine if reducable. Abdomen is also diffusely tender to palpation and rebound.  No guarding.   Hypoactive BS Extremities:  Non edemetus   Impression & Recommendations:  Problem # 1:  ABDOMINAL PAIN, GENERALIZED (ICD-789.07) Assessment Deteriorated  Possibly due to umbilical hernia.  Suspect incarcerated hernia.   Needs urgent CT for diagnosis and evaluation and admit to [facility] service if true.  Sending patient directly to the ED in a private car.  Have contacted the triage nurse.  Orders: [facility]- Est Level  5 ([address])  Complete Medication List: 1)  Bayer Childrens Aspirin 81 Mg Chew (Aspirin) .... Take 1 tablet by mouth once a day 2)  Lisinopril 20 Mg Tabs (Lisinopril) .Marland Kitchen.. 1 tablet by mouth daily. label in spanish 3)  Metformin Hcl 1000 Mg Tabs (Metformin hcl) .... Daily 4)  Cvs Iron 325 (65 Fe) Mg Tabs (Ferrous sulfate) .... Take one by mouth daily 5)  Simvastatin 80 Mg Tabs (Simvastatin) .... Daily 6)  Metoprolol Tartrate 25 Mg Tabs (Metoprolol tartrate) .... Take 1/2 tablet by mouth daily for blood pressure 7)  Fish Oil Oil (Fish oil) .... One daily 8)  Colace 100 Mg Caps (Docusate sodium) .... Take one by mouth two times a day 9)  Ranitidine Hcl 150 Mg Caps (Ranitidine hcl) .Marland Kitchen.. 1 cap by mouth two times a day for stomach  Other Orders: Urinalysis-[facility] (00000)  Laboratory Results   Urine Tests  Date/Time Received: November 17, 2009 4:33 PM  Date/Time Reported: November 17, 2009 4:47 PM   Routine Urinalysis   Color: yellow Appearance: Clear Glucose: negative   (Normal Range: Negative) Bilirubin: negative   (Normal Range: Negative) Ketone: negative   (Normal Range: Negative) Spec. Gravity: 1.020   (Normal Range: 1.003-1.035) Blood: negative   (Normal Range: Negative) pH: 5.5   (Normal Range: 5.0-8.0) Protein:  negative   (Normal Range: Negative) Urobilinogen: 0.2   (Normal Range: 0-1) Nitrite: negative   (Normal Range: Negative) Leukocyte Esterace: negative   (Normal Range: Negative)    Comments: ...........test performed by..........Marland Kitchen [place], SMA

## 2010-11-30 NOTE — Progress Notes (Signed)
Summary: refill  Phone Note Refill Request Call back at Home Phone [phone] Message from:  Patient  Refills Requested: Medication #1:  SIMVASTATIN 80 MG TABS daily Initial call taken by: De Nurse,  May 13, 2010 4:38 PM  Follow-up for Phone Call         to Dr. Deirdre Priest Follow-up by: Golden Circle RN,  May 13, 2010 4:40 PM  Additional Follow-up for Phone Call Additional follow up Details #1::        Not sure why you're sending this to me? 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 i did not want to fill it unless a doctor said it was ok Follow-up by: Golden Circle RN,  May 14, 2010 11:30 AM  Additional Follow-up for Phone Call Additional follow up Details #3:: Details for Additional Follow-up Action Taken: I would leave her on her current dose of 80mg , has been on a while (>1 year) and seems to be tolerating well.  Currently at goal for lipids on this medication. Additional Follow-up by: Everrett Coombe DO,  May 14, 2010 1:40 PM

## 2010-11-30 NOTE — Miscellaneous (Signed)
Summary: RX Refills  Patient needs refills of Metformin and Linsinopril.  She uses [facility] on [address]. Meredith Maynard  April 22, 2010 4:42 PM  done. Meredith Boyden  MD  April 22, 2010 4:47 PM Prescriptions: Prescriptions: METFORMIN HCL 1000 MG TABS (METFORMIN HCL) daily  #90 x 1   Entered and Authorized by:   Meredith Boyden  MD   Signed by:   Meredith Boyden  MD on 04/22/2010   Method used:   Electronically to        [facility] [phone]* (retail)       [address]       [place], [place]  [address]       Ph: [phone]       Fax: [phone]   RxID:   [id number] LISINOPRIL 20 MG TABS (LISINOPRIL) 1 tablet by mouth daily. label in spanish  #90 x 1   Entered and Authorized by:   Meredith Boyden  MD   Signed by:   Meredith Boyden  MD on 04/22/2010   Method used:   Electronically to        [facility] [phone]* (retail)       [address]       [place], [place]  [address]       Ph: [phone]       Fax: [phone]   RxID:   [id number]

## 2010-11-30 NOTE — Progress Notes (Signed)
Summary: triage  Phone Note Call from Patient Call back at Home Phone [phone]   Caller: Daughter-Julianna Summary of Call: Pt states she is urinating very little and it hurts also. Initial call taken by: Clydell Hakim,  May 18, 2010 11:09 AM  Follow-up for Phone Call        spoke with dtr who interpreted. voiding small amounts of urine but drinking normally. burns with urination. 1:30 work in appt. aware there may be a wait. interpretor arranged Follow-up by: Golden Circle RN,  May 18, 2010 11:16 AM

## 2010-11-30 NOTE — Assessment & Plan Note (Signed)
Summary: burns with urination/[place]/matthews   Vital Signs:  Patient profile:   45 year old female Height:      64.5 inches Weight:      250 pounds BMI:     42.40 BSA:     2.16 Temp:     99.0 degrees F Pulse rate:   85 / minute BP sitting:   109 / 71  Vitals Entered By: Jone Baseman CMA (May 18, 2010 3:27 PM) CC: burning with urination Is Patient Diabetic? No Pain Assessment Patient in pain? yes     Location: lower back and legs Intensity: 6   Primary Care Provider:  Everrett Coombe DO  CC:  burning with urination.  History of Present Illness: 33 YOF w/ PMHx/o DM, morbid obesity, umbilical hernia here for acute visit for dysuria Dysuria: Pt reports 4-5 day hx/o dysuria. No hesitancy or urgency. No abd/flank pain, N/V, recent GI infection, constipation, fever. Pt states that she recently changed soaps with similar sxs happening the last time she changed soaps. Pt concerned that she may have yeast infection. Blood sugars have been in mid 200s at home. Compliant w/ regimen per pt.    Habits & Providers  Alcohol-Tobacco-Diet     Tobacco Status: never  Allergies: No Known Drug Allergies  Physical Exam  General:  alert and underweight appearing.   Head:  normocephalic and atraumatic.   Eyes:  vision grossly intact.   Neck:  large neck girth full ROM.   Lungs:  CTAB  Heart:  RRR Abdomen:  obese, non tender, +bowel sounds, no suprapubic tenderness Genitalia:  Declined exam    Impression & Recommendations:  Problem # 1:  DYSURIA (ICD-788.1) Will give pt  treatment for complicated candidal vulvovaginitis given comorbidities w/ urine cx pending (UA relatively unimpressive) aas well as pt refusing GU exam. If urine cx positive, will treat accordingly. Red flags reviewed. Pt instructed to return to clinic if sxs persistent >7-10 days. Pt agreeable.  Orders: Urinalysis-[facility] (00000) Urine Culture-[facility] ([id number]) [facility]- Est Level  3 ([address])  Problem # 2:  DM,  UNCOMPLICATED, TYPE II (ICD-250.00) >6 months since last HbA1C. Will obtain. Pt instructed to followup w. PCP in next 1-2 months to address care.  Her updated medication list for this problem includes:    Bayer Childrens Aspirin 81 Mg Chew (Aspirin) .Marland Kitchen... Take 1 tablet by mouth once a day    Lisinopril 20 Mg Tabs (Lisinopril) .Marland Kitchen... 1 tablet by mouth daily. label in spanish    Metformin Hcl 1000 Mg Tabs (Metformin hcl) .Marland Kitchen... Daily  Orders: A1C-[facility] ([address])  Complete Medication List: 1)  Bayer Childrens Aspirin 81 Mg Chew (Aspirin) .... Take 1 tablet by mouth once a day 2)  Lisinopril 20 Mg Tabs (Lisinopril) .Marland Kitchen.. 1 tablet by mouth daily. label in spanish 3)  Metformin Hcl 1000 Mg Tabs (Metformin hcl) .... Daily 4)  Cvs Iron 325 (65 Fe) Mg Tabs (Ferrous sulfate) .... Take one by mouth daily 5)  Simvastatin 80 Mg Tabs (Simvastatin) .... Daily 6)  Metoprolol Tartrate 25 Mg Tabs (Metoprolol tartrate) .... Take 1/2 tablet by mouth daily for blood pressure 7)  Fish Oil Oil (Fish oil) .... One daily 8)  Colace 100 Mg Caps (Docusate sodium) .... Take one by mouth two times a day 9)  Ranitidine Hcl 150 Mg Caps (Ranitidine hcl) .Marland Kitchen.. 1 cap by mouth two times a day for stomach 10)  Fluconazole 150 Mg Tabs (Fluconazole) .... Take 1 tablet every 3 days for 3 doses (3  pills over 9 days) Prescriptions: FLUCONAZOLE 150 MG TABS (FLUCONAZOLE) take 1 tablet every 3 days for 3 doses (3 pills over 9 days)  #3 x 0   Entered by:   Doree Albee MD   Authorized by:   Marland Kitchen FPCDEFAULTPROVIDER   Signed by:   Doree Albee MD on 05/18/2010   Method used:   Print then Give to Patient   RxID:   [id number]   Laboratory Results   Urine Tests  Date/Time Received: May 18, 2010 3:24 PM  Date/Time Reported: May 18, 2010 4:42 PM   Routine Urinalysis   Color: yellow Appearance: Clear Glucose: negative   (Normal Range: Negative) Bilirubin: negative   (Normal Range: Negative) Ketone: trace (5)   (Normal Range:  Negative) Spec. Gravity: >=1.030   (Normal Range: 1.003-1.035) Blood: trace-intact   (Normal Range: Negative) pH: 5.0   (Normal Range: 5.0-8.0) Protein: negative   (Normal Range: Negative) Urobilinogen: 1.0   (Normal Range: 0-1) Nitrite: negative   (Normal Range: Negative) Leukocyte Esterace: negative   (Normal Range: Negative)  Urine Microscopic RBC/HPF: occ Bacteria/HPF: 2+ Mucous/HPF: 2+ Epithelial/HPF: 1-5    Comments: urine sent for culture ...............test performed by......Marland KitchenBonnie A. [place], MLS (ASCP)cm   Blood Tests   Date/Time Received: May 18, 2010 4:38 PM  Date/Time Reported: May 18, 2010 4:48 PM   HGBA1C: 6.9%   (Normal Range: Non-Diabetic - 3-6%   Control Diabetic - 6-8%)  Comments: ...............test performed by......Marland KitchenBonnie A. [place], MLS (ASCP)cm

## 2010-11-30 NOTE — Progress Notes (Signed)
Summary: Refill  Phone Note Refill Request   Refills Requested: Medication #1:  METFORMIN HCL 1000 MG TABS daily pt goes to [facility]  Initial call taken by: Knox Royalty,  January 04, 2010 8:23 AM    Prescriptions: METFORMIN HCL 1000 MG TABS (METFORMIN HCL) daily  #30 x 3   Entered by:   Arlyss Repress CMA,   Authorized by:   Eustaquio Boyden  MD   Signed by:   Arlyss Repress CMA, on 01/04/2010   Method used:   Electronically to        [facility] [phone]* (retail)       [address]       [place], [place]  [address]       Ph: [phone]       Fax: [phone]   RxID:   [id number]

## 2010-11-30 NOTE — Letter (Signed)
Summary: Generic Letter  [facility]  [address]   [place], [place] [address]   Phone: [phone]  Fax: [phone]    07/25/2010  Meredith Maynard [address] [place], [place]  [address]  [name], Los resultados de su prueba de Papanicolaou que se hizo en su ltima visita fue normal. Para su otros laboratorios de su hemoglobina estaba un poco baja de 9.5 as que me gustara que usted contine su suplemento de hierro. Me gustara que hacer otra cita dentro de [address] para poder ver sus niveles de hemoglobina de [place]. Su nivel de LDL (colesterol malo) an estaba un poco elevada, pero lo suficientemente cerca de meta que podemos esperar y comprobar de nuevo en una fecha posterior. Os animo a continuar con su ejercicio y comer frutas y verduras. Sus otros laboratorios para probar su funcin renal y [place] representative. Si usted tiene alguna pregunta por favor pngase en contacto conmigo en el centro mdico de familia.   Atentamente,   Everrett Coombe DO  Appended Document: Generic Letter mailed.

## 2010-11-30 NOTE — Miscellaneous (Signed)
Summary: hernia pain  Clinical Lists Changes daughter called to make an appt for her mom. says her hernia is painful & she wants to be seen today. appt at 3pm in work in. aware of wait & that she will not be seeing her pcp. interpretor arranged.Marland KitchenMarland KitchenGolden Circle RN  November 17, 2009 9:06 AM

## 2010-11-30 NOTE — Progress Notes (Signed)
Summary: re: work excuse  ---- Converted from flag ---- ---- 11/20/2009 9:58 AM, Arlyss Repress CMA, wrote:   ---- 11/19/2009 12:41 PM, Bradly Bienenstock wrote: Patient needs a note for work. She was unable to go today and will return tomorrow.  Her phone number is [phone].    See me about this.  She left a copy of report from ed/  Thanks Olegario Messier H ------------------------------  called patient, unable to leave message because no voice mail. will leave work excuse up front for patient to pick up.

## 2010-11-30 NOTE — Assessment & Plan Note (Signed)
Summary: cpp/eo   Vital Signs:  Patient profile:   45 year old female Weight:      243.3 pounds Temp:     98.3 degrees F oral Pulse rate:   88 / minute Pulse rhythm:   or BP sitting:   110 / 74  (left arm) Cuff size:   large  Vitals Entered By: Loralee Pacas CMA (June 11, 2010 3:09 PM)  Primary Provider:  Everrett Coombe DO   History of Present Illness: Patient here for complete physical.   1.Patient with concerns about menopause, believes she may be in the beginning stages of menopause.  Having increased clots during her menses.  Not having any hot flashes, or mood changes.  Would like more information on menopause  No other concerns, has been getting increased exercise and changed her diet so that she does not eat red meat and is eating more fruits and vegetables.       Diabetic Foot Exam Foot Inspection Is there a history of a foot ulcer?              No Is there a foot ulcer now?              No Can the patient see the bottom of their feet?          Yes Are the shoes appropriate in style and fit?          No Is there swelling or an abnormal foot shape?          No Are the toenails long?                No Are the toenails thick?                No Are the toenails ingrown?              No Is there heavy callous build-up?              No Is there pain in the calf muscle (Intermittent claudication) when walking?    NoIs there a claw toe deformity?              No Is there elevated skin temperature?            No Is there limited ankle dorsiflexion?            No Is there foot or ankle muscle weakness?            No  Diabetic Foot Care Education Pulse Check          Right Foot          Left Foot Posterior Tibial:        normal            normal Dorsalis Pedis:        normal            normal  High Risk Feet? No Set Next Diabetic Foot Exam here: 06/10/2011   10-g (5.07) Semmes-Weinstein Monofilament Test           Right Foot          Left Foot Visual Inspection      normal           normal Test Control      normal         normal Site 1         normal         normal  Site 4         normal         normal Site 5         normal         normal Site 6         normal         normal Site 9         normal         normal  Current Medications (verified): 1)  Bayer Childrens Aspirin 81 Mg Chew (Aspirin) .... Take 1 Tablet By Mouth Once A Day 2)  Lisinopril 20 Mg Tabs (Lisinopril) .Marland Kitchen.. 1 Tablet By Mouth Daily. Label in Spanish 3)  Metformin Hcl 1000 Mg Tabs (Metformin Hcl) .... Daily 4)  Cvs Iron 325 (65 Fe) Mg Tabs (Ferrous Sulfate) .... Take One By Mouth Daily 5)  Simvastatin 80 Mg Tabs (Simvastatin) .... Daily 6)  Metoprolol Tartrate 25 Mg Tabs (Metoprolol Tartrate) .... Take 1/2 Tablet By Mouth Daily For Blood Pressure 7)  Fish Oil  Oil (Fish Oil) .... One Daily 8)  Colace 100 Mg Caps (Docusate Sodium) .... Take One By Mouth Two Times A Day 9)  Ranitidine Hcl 150 Mg Caps (Ranitidine Hcl) .Marland Kitchen.. 1 Cap By Mouth Two Times A Day For Stomach  Allergies (verified): No Known Drug Allergies  Past History:  Past Medical History: Last updated: 07/08/2009 Anxiety with panick attacks Meniere`s syndrome. T2DM x 4 years GERD HLD - LDL 106 2010. Somatic patient Two unit PRBC transfusion (08/31/2006) Neg ETT 12/29/05 Fatty Liver (RUQ ultrasound 06/13/05 and 07/2009) HTN x 4 years  Past Surgical History: Last updated: 07/08/2009 Umbilical hernia repair w/o mesh with small bowel resection and primary closure By Dr. Lindie Spruce - 08/31/2006 C-section x 3 (2000, 2004, 2005) Tubal Ligation ETT 05/2009 - no ischemic changes.  + poor exercise tolerance Abd [place] 07/2009 - no gallstones or inflammation.  + fatty liver.  Family History: Last updated: 04/18/2009 Brother - HTN.  CVA in his 62s.  Died from MI at age 83  Social History: Last updated: 04/18/2009 Working part time as Sports administrator since 7/06. Pt non smoker/ETOH/drugs.  Review of Systems       + for Joint pain    Denies: Fever, chills, unintentional weight loss, insomnia, headaches, vision change, chest pain, shortness of breath, abdominal pain, nausea, vomiting, diarrhea.   Physical Exam  General:  Well-developed,obese,in no acute distress; alert,appropriate and cooperative throughout examination Head:  Normocephalic and atraumatic without obvious abnormalities. No apparent alopecia or balding. Eyes:  No corneal or conjunctival inflammation noted. EOMI. Perrla.  Ears:  External ear exam shows no significant lesions or deformities.  Otoscopic examination reveals clear canals, tympanic membranes are intact bilaterally without bulging, retraction, inflammation or discharge.  Mouth:  Oral mucosa and oropharynx without lesions or exudates.  Teeth in good repair. Neck:  No deformities, masses, or tenderness noted. Lungs:  Normal respiratory effort, chest expands symmetrically. Lungs are clear to auscultation, no crackles or wheezes. Heart:  Normal rate and regular rhythm. S1 and S2 normal without gallop, murmur, click, rub or other extra sounds. Abdomen:  Umbilical hernia, easily reducible.  Scar from previous hernia repair. Bowel sounds positive,abdomen soft and non-tender , organomegaly. Genitalia:  Normal introitus for age, no external lesions, no vaginal discharge, mucosa pink and moist, no vaginal or cervical lesions, no vaginal atrophy, no friaility or hemorrhage, normal uterus size and position, no adnexal masses or tenderness Msk:  No deformity or scoliosis noted  of thoracic or lumbar spine.   Pulses:  R and L carotid, dorsalis pedis and posterior tibial pulses are full and equal bilaterally Extremities:  No clubbing, cyanosis, edema, or deformity noted with normal full range of motion of all joints.   Neurologic:  No cranial nerve deficits noted. Station and gait are normal. Plantar reflexes are down-going bilaterally. DTRs are symmetrical throughout. Sensory, motor and coordinative functions appear  intact. Skin:  Intact without suspicious lesions or rashes   Impression & Recommendations:  Problem # 1:  PHYSICAL EXAMINATION (ICD-V70.0) Well adult visit.  She has concerns over menopause, reassured her this is something that all women go through and that he symptoms do not warrant hormone replacement therapy.  Is doing well with diet and exercise, walking 1 hour daily and has increased her fruits and vegetables in her diet while cutting out red meat, encouraged her to continue this.  Will check CMP, CBC, and direct LDL today.  PAP was also done today, will mail her a letter with results.  Complete Medication List: 1)  Bayer Childrens Aspirin 81 Mg Chew (Aspirin) .... Take 1 tablet by mouth once a day 2)  Lisinopril 20 Mg Tabs (Lisinopril) .Marland Kitchen.. 1 tablet by mouth daily. label in spanish 3)  Metformin Hcl 1000 Mg Tabs (Metformin hcl) .... Daily 4)  Cvs Iron 325 (65 Fe) Mg Tabs (Ferrous sulfate) .... Take one by mouth daily 5)  Simvastatin 80 Mg Tabs (Simvastatin) .... Daily 6)  Metoprolol Tartrate 25 Mg Tabs (Metoprolol tartrate) .... Take 1/2 tablet by mouth daily for blood pressure 7)  Fish Oil Oil (Fish oil) .... One daily 8)  Colace 100 Mg Caps (Docusate sodium) .... Take one by mouth two times a day 9)  Ranitidine Hcl 150 Mg Caps (Ranitidine hcl) .Marland Kitchen.. 1 cap by mouth two times a day for stomach  Other Orders: Pap Smear-[facility] ([id number]) Direct LDL-[facility] [phone]) Comp Met-[facility] [phone]) CBC-[facility] (01601) [facility] - Est  40-64 yrs (09323)  Prevention & Chronic Care Immunizations   Influenza vaccine: Fluvax Non-MCR  (10/13/2009)   Influenza vaccine due: 08/27/2009    Tetanus booster: 10/31/2004: Done.   Tetanus booster due: 10/31/2014    Pneumococcal vaccine: Not documented  Other Screening   Pap smear: NEGATIVE FOR INTRAEPITHELIAL LESIONS OR MALIGNANCY.  (08/27/2008)   Pap smear due: 08/27/2009    Mammogram: Not documented   Smoking status: never  (05/18/2010)  Diabetes  Mellitus   HgbA1C: 6.9  (05/18/2010)   Hemoglobin A1C due: 09/11/2010    Eye exam: normal  (02/12/2009)   Eye exam due: 02/12/2010    Foot exam: yes  (07/18/2007)   Foot exam action/deferral: Do today   High risk foot: No  (06/11/2010)   Foot care education: Not documented   Foot exam due: 06/10/2011    Urine microalbumin/creatinine ratio: Not documented   Urine microalbumin/cr due: 03/29/2008    Diabetes flowsheet reviewed?: Yes   Progress toward A1C goal: At goal  Lipids   Total Cholesterol: 187  (05/20/2008)   Lipid panel action/deferral: Deferred   LDL: 106  (05/20/2008)   LDL Direct: Not documented   HDL: 45  (05/20/2008)   Triglycerides: 180  (05/20/2008)   Lipid panel due: 08/06/2009    SGOT (AST): 16  (07/11/2008)   SGPT (ALT): 17  (07/11/2008) CMP ordered    Alkaline phosphatase: 103  (07/11/2008)   Total bilirubin: 0.5  (07/11/2008)   Liver panel due: 08/06/2009    Lipid flowsheet reviewed?: Yes  Progress toward LDL goal: At goal  Hypertension   Last Blood Pressure: 110 / 74  (06/11/2010)   Serum creatinine: 0.63  (07/11/2008)   BMP action: Ordered   Serum potassium 4.1  (07/11/2008) CMP ordered    Basic metabolic panel due: 08/06/2009    Hypertension flowsheet reviewed?: Yes   Progress toward BP goal: At goal  Self-Management Support :   Personal Goals (by the next clinic visit) :     Personal A1C goal: 7  (07/07/2009)     Personal blood pressure goal: 130/80  (07/07/2009)     Personal LDL goal: 100  (07/07/2009)    Diabetes self-management support: Written self-care plan  (07/07/2009)    Hypertension self-management support: Written self-care plan  (07/07/2009)    Lipid self-management support: Written self-care plan  (07/07/2009)    Nursing Instructions: Diabetic foot exam today

## 2010-12-02 NOTE — Progress Notes (Signed)
Summary: Rx  Phone Note Refill Request Call back at [phone]   pt needs DM medication, only has 2 left, just now got deb hill so has appt in feb, needs 1 more month until she can see MD   Follow-up for Phone Call       Follow-up by: Golden Circle RN,  November 11, 2010 1:46 PM    Prescriptions: METFORMIN HCL 1000 MG TABS (METFORMIN HCL) daily  #30 x 0   Entered by:   Golden Circle RN   Authorized by:   Everrett Coombe DO   Signed by:   Golden Circle RN on 11/11/2010   Method used:   Electronically to        [facility] [phone]* (retail)       [address]       [place], [place]  [address]       Ph: [phone]       Fax: [phone]   RxID:   [id number]

## 2010-12-02 NOTE — Letter (Signed)
Summary: Out of Work  [facility]  [address]   [place], [place] [address]   Phone: [phone]  Fax: [phone]    October 20, 2010   Employee:  Meredith Maynard    To Whom It May Concern:   For Medical reasons, please excuse the above named employee from work for the following dates:  Start:  October 20, 2010   End:  October 20, 2010   If you need additional information, please feel free to contact our office.         Sincerely,    Demetria Pore MD

## 2010-12-02 NOTE — Assessment & Plan Note (Signed)
Summary: uti?,df   Vital Signs:  Patient profile:   45 year old female Height:      64.5 inches Weight:      243.3 pounds BMI:     41.27 Temp:     98.2 degrees F oral Pulse rate:   71 / minute BP sitting:   109 / 74  (left arm) Cuff size:   regular  Vitals Entered By: Tessie Fass CMA (October 20, 2010 3:01 PM) CC: UTI?   Primary Provider:  Everrett Coombe DO  CC:  UTI?Marland Kitchen  History of Present Illness: Visit was conducted with the assistance of a hospial provided Spanish interpreter.  45 yo p/w cc of decreased urination x 2 weeks and "feeling really bad" last night.  Per the pt she feels like she has been urinating very little each time she goes to the bathroom for the past 1-2 weeks.  When asked, she dose endorse drinking less over the past few weeks because it has been cold and cannot give an estimation of amount of fluid she drinks or the amount she urinates.   Last night, she began feeling achy/cramoy all over, shaking, felt like her hands and feet were cold and have some chest pain with left arm numbness.  She states she has been seen for thsi chest pain before and has always had a normal heart tracing; has never needed a special picture of her heart or surgery on her heart.   She also complains of leg swelling and pain in her lower back. Per the pt, she took her BP medicine (lisinopril) yesterday as Rx'ed and then took a second dose when she started feeling badly last night.  Approx 40 mins after, she began peeing a lot, and had "lots out" when she urinated 5 times and then started "feeling more stable." She had some dizziness this morning, which has resolved.   Current Medications (verified): 1)  Bayer Childrens Aspirin 81 Mg Chew (Aspirin) .... Take 1 Tablet By Mouth Once A Day 2)  Lisinopril 20 Mg Tabs (Lisinopril) .Marland Kitchen.. 1 Tablet By Mouth Daily. Label in Spanish 3)  Metformin Hcl 1000 Mg Tabs (Metformin Hcl) .... Daily 4)  Ferrousul 325 (65 Fe) Mg Tabs (Ferrous Sulfate) .Marland Kitchen.. 1  Tab By Mouth Bid 5)  Simvastatin 80 Mg Tabs (Simvastatin) .... Daily  Allergies: No Known Drug Allergies  Physical Exam  General:  alert, well-developed, well-nourished, and well-hydrated.   Lungs:  normal respiratory effort, normal breath sounds, no crackles, and no wheezes.   Heart:  normal rate, regular rhythm, and no murmur.   Abdomen:  soft, non-tender, and normal bowel sounds.   Extremities:  2+ pedal pulses; trace BLE edema Neurologic:  alert & oriented X3 and cranial nerves II-XII intact.     Impression & Recommendations:  Problem # 1:  OLIGURIA AND ANURIA (ICD-788.5) Likley 2/2 poor ability to guesstmate Is/Os + decreased by mouth intake.  Cr has been WNL. Consider rechecking Cr if problem persists.   Orders: [facility]- Est Level  3 ([id number])  Problem # 2:  CHEST PAIN UNSPECIFIED (ICD-786.50) EKG NSR, no changes. Unclear etiology but very unlikely to be cardiac. Monitor.  Pt reports this is not the first time she has been seen for CP and had EKG.   Orders: [facility]- Est Level  3 (29562)  Problem # 3:  DIZZINESS (ICD-780.4) Likely 2/2 transient hypotension from taking double dose of BP medication.   Orders: [facility]- Est Level  3 (13086)  Problem # 4:  BACK PAIN (ICD-724.5) Most likely MSK. Discussed heating pad and stretches.   Her updated medication list for this problem includes:    Bayer Childrens Aspirin 81 Mg Chew (Aspirin) .Marland Kitchen... Take 1 tablet by mouth once a day  Orders: [facility]- Est Level  3 ([id number])  Complete Medication List: 1)  Bayer Childrens Aspirin 81 Mg Chew (Aspirin) .... Take 1 tablet by mouth once a day 2)  Lisinopril 20 Mg Tabs (Lisinopril) .Marland Kitchen.. 1 tablet by mouth daily. label in spanish 3)  Metformin Hcl 1000 Mg Tabs (Metformin hcl) .... Daily 4)  Ferrousul 325 (65 Fe) Mg Tabs (Ferrous sulfate) .Marland Kitchen.. 1 tab by mouth bid 5)  Simvastatin 80 Mg Tabs (Simvastatin) .... Daily  Other Orders: Urinalysis-[facility] (00000)  Patient Instructions: 1)  The good news is, I do  not think that there is anything scary going on. 2)  I think that if you have been peeing less, it is likely because you are drinking less water, and not because there is something wrong.  If you are really worried or if you feel like it happens again, you can write down the amount of water/fluids you are drinking and collect your urine and compare how much you are taking in vs. putting out. 3)  Your EKG is perfect, so I do not think that your chest pain is from anything scary, either.  However, please come back or go to the ER if you again have chest pain with shortness of breath, left arm or jaw numbness/tingling, or dizziness. You can discuss with Dr. Ashley Royalty other causes of chest pain, such as stress, as your next visit. 4)  I also do not think your back pain is from anything scary.  You can try putting a heating pad on it and some gentle stretching exercises to help relieve some of the discomfort. If it continues, please make an appointment to come see Dr. Ashley Royalty. 5)  Have a great holiday!   Orders Added: 1)  Urinalysis-[facility] [00000] 2)  [facility]- Est Level  3 [99213]    Laboratory Results   Urine Tests  Date/Time Received: October 20, 2010 2:56 PM  Date/Time Reported: October 20, 2010 3:18 PM   Routine Urinalysis   Color: yellow Appearance: Clear Glucose: negative   (Normal Range: Negative) Bilirubin: negative   (Normal Range: Negative) Ketone: negative   (Normal Range: Negative) Spec. Gravity: 1.020   (Normal Range: 1.003-1.035) Blood: trace-intact   (Normal Range: Negative) pH: 5.5   (Normal Range: 5.0-8.0) Protein: negative   (Normal Range: Negative) Urobilinogen: 0.2   (Normal Range: 0-1) Nitrite: negative   (Normal Range: Negative) Leukocyte Esterace: negative   (Normal Range: Negative)  Urine Microscopic WBC/HPF: rare RBC/HPF: rare Bacteria/HPF: trace Epithelial/HPF: 0-2    Comments: ...............test performed by......Marland KitchenBonnie A. [place], MLS (ASCP)cm

## 2010-12-06 ENCOUNTER — Encounter: Payer: Self-pay | Admitting: *Deleted

## 2010-12-06 ENCOUNTER — Ambulatory Visit: Payer: Self-pay | Admitting: Family Medicine

## 2010-12-13 ENCOUNTER — Ambulatory Visit: Payer: Self-pay | Admitting: Family Medicine

## 2010-12-16 ENCOUNTER — Ambulatory Visit (INDEPENDENT_AMBULATORY_CARE_PROVIDER_SITE_OTHER): Payer: Self-pay | Admitting: Family Medicine

## 2010-12-16 VITALS — BP 132/83 | HR 100 | Temp 98.5°F | Ht 65.5 in | Wt 240.0 lb

## 2010-12-16 DIAGNOSIS — K047 Periapical abscess without sinus: Secondary | ICD-10-CM

## 2010-12-16 DIAGNOSIS — E119 Type 2 diabetes mellitus without complications: Secondary | ICD-10-CM

## 2010-12-16 MED ORDER — PENICILLIN V POTASSIUM 500 MG PO TABS: 500.0000 mg | ORAL_TABLET | Freq: Four times a day (QID) | ORAL | Status: AC

## 2010-12-16 NOTE — Patient Instructions (Addendum)
Fue agradable ver que [employer]. Usted tiene una [employer], que voy a enviar a travs de los antibiticos para el diente a la farmacia de [place]. Usted tambin puede tomar ibuprofeno (Motrin, Advil) para Agricultural consultant. Usted puede tomar 600 mg cada seis horas segn sea necesario. Si usted siente que necesita un medicamento para el dolor ms fuerte que me Administrator. He enviado en otro de referencia para la clnica dental. Tambin hay una clnica dental en [place] tarifa de 23 hasta 24 marzo 2012. Es en la [place] [address] [place], [place]. Usted tendr que llegar muy temprano para ser visto.

## 2010-12-17 ENCOUNTER — Telehealth: Payer: Self-pay | Admitting: *Deleted

## 2010-12-17 DIAGNOSIS — I10 Essential (primary) hypertension: Secondary | ICD-10-CM

## 2010-12-17 MED ORDER — FERROUS SULFATE 325 (65 FE) MG PO TABS: 325.0000 mg | ORAL_TABLET | Freq: Two times a day (BID) | ORAL | Status: DC

## 2010-12-17 MED ORDER — LISINOPRIL 20 MG PO TABS: 20.0000 mg | ORAL_TABLET | Freq: Every day | ORAL | Status: DC

## 2010-12-17 MED ORDER — SIMVASTATIN 80 MG PO TABS: 80.0000 mg | ORAL_TABLET | Freq: Every day | ORAL | Status: DC

## 2010-12-21 ENCOUNTER — Encounter: Payer: Self-pay | Admitting: Family Medicine

## 2010-12-21 NOTE — Progress Notes (Signed)
  Subjective:    Patient ID: Meredith Maynard, female    DOB: 09/19/1966, 45 y.o.   MRN: [id number]  Dental Pain  This is a new problem. The current episode started 1 to 4 weeks ago (3 weeks ago). The problem occurs constantly. The problem has been gradually worsening. The pain is at a severity of 9/10. Associated symptoms include facial pain, sinus pressure and thermal sensitivity. Pertinent negatives include no difficulty swallowing, fever or oral bleeding. Associated symptoms comments: Pain is L ear and feel like stinging in lips and tongue.. She has tried nothing (Not interested in narcotic pain medications because she does not want anything sedating) for the symptoms. The treatment provided mild relief.      Review of Systems  Constitutional: Negative for fever.  HENT: Positive for ear pain, facial swelling and sinus pressure.        Objective:   Physical Exam  Constitutional: She appears well-developed and well-nourished. No distress.  HENT:  Head: Normocephalic and atraumatic.  Mouth/Throat: Oropharynx is clear and moist and mucous membranes are normal. Dental abscesses and dental caries present.    Lymphadenopathy:       Head (right side): No submental, no submandibular and no tonsillar adenopathy present.       Head (left side): No submental, no submandibular and no tonsillar adenopathy present.

## 2010-12-21 NOTE — Assessment & Plan Note (Signed)
Dental abscess on L lower molar.  Seems to be in significant amount of pain.  Unable to chew on that side.  Will start Penicillin and recommend ibuprofen (would use vicodin but pt has small children and does not want anything sedating)  Will refer for emergent dental appointment.

## 2010-12-21 NOTE — Assessment & Plan Note (Signed)
>  3 mos since last a1c, will get today to assess level of control and adjust meds as needed

## 2010-12-22 ENCOUNTER — Other Ambulatory Visit: Payer: Self-pay | Admitting: *Deleted

## 2010-12-22 MED ORDER — METFORMIN HCL 1000 MG PO TABS: 1000.0000 mg | ORAL_TABLET | Freq: Every day | ORAL | Status: DC

## 2010-12-28 ENCOUNTER — Encounter: Payer: Self-pay | Admitting: Family Medicine

## 2011-01-14 ENCOUNTER — Ambulatory Visit (INDEPENDENT_AMBULATORY_CARE_PROVIDER_SITE_OTHER): Payer: Self-pay | Admitting: Family Medicine

## 2011-01-14 ENCOUNTER — Encounter: Payer: Self-pay | Admitting: Family Medicine

## 2011-01-14 DIAGNOSIS — E119 Type 2 diabetes mellitus without complications: Secondary | ICD-10-CM

## 2011-01-14 DIAGNOSIS — K0889 Other specified disorders of teeth and supporting structures: Secondary | ICD-10-CM

## 2011-01-14 DIAGNOSIS — I10 Essential (primary) hypertension: Secondary | ICD-10-CM

## 2011-01-14 DIAGNOSIS — K047 Periapical abscess without sinus: Secondary | ICD-10-CM

## 2011-01-14 DIAGNOSIS — K089 Disorder of teeth and supporting structures, unspecified: Secondary | ICD-10-CM

## 2011-01-14 MED ORDER — LISINOPRIL 20 MG PO TABS: 20.0000 mg | ORAL_TABLET | Freq: Every day | ORAL | Status: DC

## 2011-01-14 MED ORDER — ONETOUCH ULTRA SYSTEM W/DEVICE KIT: 1.0000 | PACK | Freq: Once | Status: DC

## 2011-01-14 MED ORDER — HYDROCODONE-ACETAMINOPHEN 7.5-500 MG PO TABS: 1.0000 | ORAL_TABLET | ORAL | Status: DC | PRN

## 2011-01-14 MED ORDER — METFORMIN HCL 1000 MG PO TABS: 1000.0000 mg | ORAL_TABLET | Freq: Every day | ORAL | Status: DC

## 2011-01-14 NOTE — Patient Instructions (Signed)
Caries dental  (Dental Caries) La caries dental es la ms frecuente de todas las enfermedades bucales. Como la [employer] de las personas tiene su primera experiencia con esta enfermedad en la niez, es particularmente importante enfrentarla entre el primero y los 12 aos de [place]. Durante [employer], aparecen los dientes de [place], se exfolian (los dientes de [place] son reemplazados por los dientes permanentes) y Engineer, water erupcin (salen) los dientes permanentes, con excepcin de los terceros molares, en su posicin definitiva. Para el tratamiento precoz de la caries dental, se recomienda que la edad en la que el nio haga su primer control dental sea entre el ao y [place] y medio o [place], antes de que aparezcan grandes caries.  La caries dental generalmente se observa al comienzo como un rea blanquecina en el esmalte. El esmalte luego se debilita y la estructura del diente se rompe. Si no se trata en las etapas iniciales, progresa hacia la pulpa y luego requerir un tratamiento ms importante para salvar el diente. PREVENCIN  En los nios, la odontologa est dirigida principalmente hacia la prevencin de las caries dentales.   Los selladores de fisuras pueden Architectural technologist prevencin. Los selladores son resinas lquidas compuestas que se aplican en las superficies de los dientes susceptibles (que estn en riesgo). Suavizan las fisuras y las cavidades y previenen que se deposite el alimento, lo que provoca la caries.   Tambin podrn prescribirse tabletas de flor en nios mayores de 3 aos, si el agua potable no es fluorada. El flor que se absorbe se incorpora al [employer] dental y hace que el diente sea menos susceptible a las caries.   Neomia Dear cuidadosa higiene con un cepillo de dientes y un hilo dental, es la mejor manera de prevenir las caries.   Una visita regular al dentista para un control y [place] limpieza tambin es muy importante.  SOLICITE ATENCIN MDICA DE INMEDIATO SI:  La temperatura se  eleva por encima de 102 F (38 C).   Presenta enrojecimiento e hinchazn en el rostro, la mandbula o el cuello.   No puede abrir Government social research officer.   Siente un dolor intenso que no puede ser [employer].  Document Released: 10/17/2005 Document Re-Released: 11/28/2006 [facility] Patient Information 2011 [place], [place].

## 2011-01-16 LAB — LIPASE, BLOOD: Lipase: 26 U/L (ref 11–59)

## 2011-01-16 LAB — DIFFERENTIAL
Basophils Relative: 1 % (ref 0–1)
Eosinophils Absolute: 0.1 10*3/uL (ref 0.0–0.7)
Lymphs Abs: 2 10*3/uL (ref 0.7–4.0)
Neutrophils Relative %: 74 % (ref 43–77)

## 2011-01-16 LAB — CBC
MCV: 77 fL — ABNORMAL LOW (ref 78.0–100.0)
Platelets: 202 10*3/uL (ref 150–400)
WBC: 9.9 10*3/uL (ref 4.0–10.5)

## 2011-01-16 LAB — URINALYSIS, ROUTINE W REFLEX MICROSCOPIC
Nitrite: NEGATIVE
Specific Gravity, Urine: 1.016 (ref 1.005–1.030)
Urobilinogen, UA: 0.2 mg/dL (ref 0.0–1.0)

## 2011-01-16 LAB — POCT PREGNANCY, URINE: Preg Test, Ur: NEGATIVE

## 2011-01-16 LAB — HEPATIC FUNCTION PANEL: Total Protein: 7 g/dL (ref 6.0–8.3)

## 2011-01-16 LAB — POCT I-STAT, CHEM 8
Creatinine, Ser: 0.6 mg/dL (ref 0.4–1.2)
HCT: 37 % (ref 36.0–46.0)
Hemoglobin: 12.6 g/dL (ref 12.0–15.0)
Potassium: 3.5 mEq/L (ref 3.5–5.1)
Sodium: 139 mEq/L (ref 135–145)

## 2011-01-16 NOTE — Progress Notes (Signed)
  Subjective:    Patient ID: Meredith Maynard, female    DOB: 1965/11/06, 45 y.o.   MRN: [id number]  HPI Pt was seen on 2/21 for dental pain and was noted to have dental abscesss on exam. Pt was given rx for penicillin and ibuprofen. Pt was reported to have fever and peripheral swelling at that time. Pt reports resolution of fever and swelling. However, pain has persisted depsite ibuprofen. Referral to Dentistry was made, however, pt was never seen at dentist.    Review of Systems     Objective:   Physical Exam  Physical Exam  Constitutional: She appears well-developed and well-nourished. No distress.  HENT:  Head: Normocephalic and atraumatic.  Mouth/Throat: Oropharynx is clear and moist and mucous membranes are normal.  Dental caries present on L side of mouth. No significant LAD noted.     A/P:  1 YOF with dental pain in setting of recent dental abscess treatment.  Dental Pain: Will treat with vicodin. No signs of infection on exam. WIll re-refer to dentistry as recent abx use and narcotic medication should meet criteria for urgent referral.   Chronic Problems: Medications refilled.

## 2011-01-16 NOTE — Assessment & Plan Note (Signed)
Will treat with vicodin. No signs of infection on exam. WIll re-refer to dentistry as recent abx use and narcotic medication should meet criteria for urgent referral.

## 2011-01-26 ENCOUNTER — Other Ambulatory Visit: Payer: Self-pay | Admitting: Family Medicine

## 2011-01-26 DIAGNOSIS — I10 Essential (primary) hypertension: Secondary | ICD-10-CM

## 2011-01-26 MED ORDER — METFORMIN HCL 1000 MG PO TABS: 1000.0000 mg | ORAL_TABLET | Freq: Every day | ORAL | Status: DC

## 2011-01-26 MED ORDER — LISINOPRIL 20 MG PO TABS: 20.0000 mg | ORAL_TABLET | Freq: Every day | ORAL | Status: DC

## 2011-02-08 LAB — URINALYSIS, ROUTINE W REFLEX MICROSCOPIC
Hgb urine dipstick: NEGATIVE
Ketones, ur: NEGATIVE mg/dL
Protein, ur: NEGATIVE mg/dL
Urobilinogen, UA: 0.2 mg/dL (ref 0.0–1.0)

## 2011-02-08 LAB — GLUCOSE, CAPILLARY: Glucose-Capillary: 130 mg/dL — ABNORMAL HIGH (ref 70–99)

## 2011-02-08 LAB — FERRITIN: Ferritin: 2 ng/mL — ABNORMAL LOW (ref 10–291)

## 2011-02-08 LAB — HEPATIC FUNCTION PANEL
Alkaline Phosphatase: 115 U/L (ref 39–117)
Bilirubin, Direct: <0.1 mg/dL (ref 0.0–0.3)
Total Bilirubin: 0.5 mg/dL (ref 0.3–1.2)
Total Protein: 7.1 g/dL (ref 6.0–8.3)

## 2011-02-08 LAB — POCT I-STAT, CHEM 8
BUN: 12 mg/dL (ref 6–23)
Creatinine, Ser: 0.6 mg/dL (ref 0.4–1.2)
Hemoglobin: 9.9 g/dL — ABNORMAL LOW (ref 12.0–15.0)
Potassium: 3.9 mEq/L (ref 3.5–5.1)
Sodium: 141 mEq/L (ref 135–145)

## 2011-02-08 LAB — CARDIAC PANEL(CRET KIN+CKTOT+MB+TROPI)
CK, MB: 0.4 ng/mL (ref 0.3–4.0)
Relative Index: INVALID (ref 0.0–2.5)
Total CK: 19 U/L (ref 7–177)
Troponin I: 0.02 ng/mL (ref 0.00–0.06)

## 2011-02-08 LAB — CBC
Hemoglobin: 8.2 g/dL — ABNORMAL LOW (ref 12.0–15.0)
RBC: 4.17 MIL/uL (ref 3.87–5.11)
RDW: 18.9 % — ABNORMAL HIGH (ref 11.5–15.5)

## 2011-02-08 LAB — FOLATE: Folate: >20 ng/mL

## 2011-02-08 LAB — POCT CARDIAC MARKERS
CKMB, poc: 1.3 ng/mL (ref 1.0–8.0)
Myoglobin, poc: 56.2 ng/mL (ref 12–200)

## 2011-02-08 LAB — RETICULOCYTES
RBC.: 3.96 MIL/uL (ref 3.87–5.11)
Retic Ct Pct: 1.4 % (ref 0.4–3.1)

## 2011-02-08 LAB — CK TOTAL AND CKMB (NOT AT ARMC): CK, MB: 1.1 ng/mL (ref 0.3–4.0)

## 2011-02-08 LAB — VITAMIN B12: Vitamin B-12: 530 pg/mL (ref 211–911)

## 2011-02-08 LAB — IRON AND TIBC
Saturation Ratios: 3 % — ABNORMAL LOW (ref 20–55)
UIBC: 409 ug/dL

## 2011-02-11 ENCOUNTER — Ambulatory Visit: Payer: Self-pay | Admitting: Family Medicine

## 2011-03-18 NOTE — H&P (Signed)
NAME:  Meredith Maynard, Meredith Maynard  ACCOUNT NO.:  [id number]   MEDICAL RECORD NO.:  [id number]          PATIENT TYPE:  OBV   LOCATION:  1823                         FACILITY:  [facility]   PHYSICIAN:  Santiago Bumpers. Hensel, M.D.DATE OF BIRTH:  1965-11-23   DATE OF ADMISSION:  11/30/2005  DATE OF DISCHARGE:                                HISTORY & PHYSICAL   CHIEF COMPLAINT:  Left arm numbness and chest pain.   HISTORY OF PRESENT ILLNESS:  Ms. Meredith Maynard is a 45 year old Hispanic  female who has been complaining of a nonspecific left arm and neck numbness  and tingling sensation for several months now.  She was worked up at the  clinic with normal EKG and cardiac enzymes normal and was diagnosed with  anxiety and panic attacks in December 2006.  Patient refers that during the  last two months she has been experiencing similar episodes three to four  times monthly at rest after several hours after stressful home situations.  These episodes have not been related to exertion and relieved itself.  Yesterday night around 12 a.m. patient woke up with ringing sensation in her  ears.  She checked her CBG, was 187 and took Glipizide at that time.  Also,  right after she developed left neck pain radiated to the right side of the  neck and back of the neck, also radiated into her upper chest and left arm  with numbness and tingling sensation associated.  Patient came to the ED,  and evaluation to rule out possible MI was started.  Patient referred that  the upper chest pain started right after she was brought into the room.  This was the first episode of chest pain that she ever experienced and it  was relieved with nitroglycerin sublingual x1.  Pain was 5/10, feeling more  like kind of pressure.  Also during the last week patient has been  complaining of weakness and dizziness intermittent, feeling short of breath  to exertion.  Patient has been very afraid and anxious today, and stated  does not  want to be informed of any bad news now.   REVIEW OF SYSTEMS:  CONSTITUTIONAL:  Obesity.  CARDIOVASCULAR:  History of  palpitations and shortness of breath.  No orthopnea.  No paroxysmal  nocturnal dyspnea.  GENITOURINARY:  Last menstrual period November 24, 2005  regular.   PROBLEM LIST:  1.  Diabetes mellitus type 2, uncomplicated.  2.  Benign systemic hypertension.  3.  Hypokalemia.  4.  Anxiety, possible panic attacks.  5.  Ventricular tachycardia.   MEDICATIONS:  1.  Glipizide 5 mg p.o. daily which patient takes sometimes up to 10 mg.  2.  Hydrochlorothiazide 25 mg daily.  3.  Fluoxetine 20 mg p.o. daily.  Patient not currently taking this      medication.  4.  K-Dur 20 mEq p.o. daily.  Patient not currently taking this medication.   ALLERGIES:  No allergies.   PROCEDURES:  1.  Bilateral tubal ligation in August 2005.  2.  Right upper quadrant ultrasound showing fatty liver.  Normal liver      function tests in August 2006.   SOCIAL HISTORY:  Patient  is working part-time [employer] since July 2006  and nonsmoker.  No alcohol.  No drug use.   PHYSICAL EXAMINATION:  VITAL SIGNS:  Temperature 98.8, heart rate 76,  respiratory rate 20, blood pressure 125/82, O2 100% on room air.  GENERAL:  No acute distress.  Lying __________ in bed.  Cushingoid features.  MENTAL STATUS:  Normal.  HEENT:  Head normocephalic.  Moon facies.  Pupils are equal, reactive to  light and accommodation.  Intact extraocular movements.  No nystagmus.  Tympanic membranes normal.  Oropharynx normal.  Mildly dry mucous membranes.  CHEST:  Nuchal and truncal obesity.  LUNGS:  Clear to auscultation bilaterally.  HEART:  RRR.  No murmurs, rubs, or gallops.  ABDOMEN:  Globulus.  Positive breath sounds.  No tenderness.  Not distended.  No hepatosplenomegaly.  GENITOURINARY:  Deferred.  EXTREMITIES:  Left shoulder subacromial space tenderness.  No impingement  sign.  Full range of motion.  Sensation  intact.  Lower extremities:  No  edema.  NEUROLOGIC:  Patient alert and oriented x3 with intact cranial nerves II-  XII.  Muscle strength 5/5 in all extremities with 2+ deep tendon reflexes.  Gait normal.  Normal balance.  SKIN:  No lesions.  ADENOPATHY:  None present.   LABORATORIES:  Sodium 137, potassium 3.2, chloride 102, CO2 30, BUN 13,  creatinine 0.7, glucose 169.  CBG 200.  Cardiac enzymes first set:  CK-MB  2.1, troponin I less than 0.05, myoglobin 58.2.  Medications received in the  ER:  Aspirin, nitroglycerin sublingual.   ASSESSMENT/PLAN:  Ms. Meredith Maynard is a 45 year old Hispanic female  patient with history of type 2 diabetes mellitus, hypertension, anxiety  disorder, presenting with her first episode of chest pain.  Admitted to rule  out myocardial infarction versus angina.  1.  Chest pain, atypical.  Similar episode previously reported regarding      left arm and neck numbness and tingling sensation.  First episode of      substernal pressure/pain relieved by nitroglycerin sublingual.  Will      admit patient to rule out myocardial infarction.  Patient with risk      factors also for stable angina.  Other possible diagnosis could be      related to anxiety, panic attack.  Patient being, at present,      noncompliant.  Will give nitroglycerin sublingual and aspirin 325 mg      p.o. daily.  2.  Diabetes mellitus, type 2.  Patient well-controlled.  Last hemoglobin      A1c normal.  Will continue Glipizide 5 mg and cover with sliding-scale      insulin.  3.  Hypokalemia secondary probably to hydrochlorothiazide use and may      discontinue hydrochlorothiazide and start ACE inhibitors.  4.  Cushingoid features in patient with diabetes mellitus type 2,      hypertension, hypokalemia, and mood lability.  Will check 24-hour urine      cortisol level to rule out Cushing's syndrome. 5.  Anxiety.  Patient noncompliant with fluoxetine.  Will hold for now.  6.   Musculoskeletal pain.  Tylenol 650 mg p.o. q.6h.  7.  Disposition:  Possible discharge after 24-hour observation.      Adrian Blackwater, MD    ______________________________  Santiago Bumpers Leveda Anna, M.D.    IM/MEDQ  D:  11/30/2005  T:  11/30/2005  Job:  [id number]

## 2011-03-18 NOTE — Discharge Summary (Signed)
NAMEAMBERLEY, Meredith Maynard            ACCOUNT NO.:  [id number]   MEDICAL RECORD NO.:  [id number]          PATIENT TYPE:  INP   LOCATION:  4705                         FACILITY:  [facility]   PHYSICIAN:  Wayne A. Sheffield Slider, M.D.    DATE OF BIRTH:  16-Sep-1966   DATE OF ADMISSION:  03/19/2009  DATE OF DISCHARGE:  03/19/2009                               DISCHARGE SUMMARY   PRIMARY CARE PHYSICIAN:  Eustaquio Boyden, MD at the [facility].   CARDIOLOGIST:  [facility].   DISCHARGE DIAGNOSES:  1. Chest pain, atypical.  2. Diabetes.  3. Iron deficiency anemia.  4. Gastroesophageal reflux disease.   DISCHARGE MEDICATIONS:  1. Aspirin 81 mg p.o. daily.  2. Metformin 100 mg p.o. daily.  This is a new dose.  3. Metoprolol 25 mg p.o. twice daily.  This is a new medication.  4. Lisinopril 20 mg p.o. daily.  5. Prevacid 30 mg p.o. daily.  6. Ranitidine 150 mg p.o. twice daily.  7. Pravastatin 80 mg p.o. each evening.  This is a new medication.  8. Ferrous sulfate 325 mg p.o. twice daily.  This is a new medication.  9. Colace 100 mg p.o. twice daily for constipation.  This is a new      medication.   CONSULTATIONS:  None.   PROCEDURES:  1. EKG that showed normal sinus rhythm with nonspecific ST wave      abnormalities.  2. Portable chest x-ray on Mar 19, 2009 that showed borderline      cardiomegaly and low lung volumes without acute disease.   PERTINENT LABS:  1. Serial cardiac enzymes that were negative.  2. Anemia panel on Mar 19, 2009 that showed retic count of 1.4%, RBC      of 3.96, reticulocyte 55.4 and 13, total iron binding capacity 422,      percent saturation 3 and B12 530, folate more than 20, ferritin 2.  3. TSH which was normal at 0.990.  4. Hepatic function panel that showed total bilirubin of 0.5, direct      bilirubin of less 0.1, alkaline phosphatase 115, AST 19, ALT 20,      total protein 7.1, albumin 3.5.  5. Urinalysis that was within normal limits.  6.  Lipase was 41.  7. CBC that showed white blood cell of 7.4, hemoglobin 8.2, hematocrit      26.6, platelet count of 250 and MCV of 63.8 and an RDW of 18.9.   BRIEF HOSPITAL COURSE:  This is a 45 year old lady who was admitted for  atypical chest pain.  1. Chest pain was non-substernal, non-exertional, not described as      pressure or diaphoresis, may be related to reflux as the patient      described some burning sensation that is relieved by burping.  But      since she did have risk factors of diabetes, strong family history,      we admitted her for chest pain rule out.  Serial cardiac enzymes      were ordered that have shown to be negative.  The patient had  on      admission an EKG with subtle EKG changes compared to previous EKG      that was 4 years ago.  A repeat EKG showed some nonspecific ST wave      abnormality without any ST depression or elevation.  The patient      had had a previous stress test in March 2007 by [facility]      that was found to be within normal limits.  During this stay, the      patient continued to have some atypical chest pain, but remained      within normal sinus rhythm without any concerns on telemetry.      Given that her pain is most likely reflux related or atypical in      nature, we decided to allowed the patient be discharged with close      followup from the [facility] and repeat stress test      from [facility].  We were not able to obtain a fasting      lipid panel during this hospitalization since her last lipid panel      was in July 2009.  We decided to start the patient on a statin with      a goal LDL of less than 70.  Her TSH during this hospitalization      has found to be within normal limits.  The patient is discharged      home with a new medication of a beta-blocker since she does have      indications for hypertension and diabetes.  She is still continue      on aspirin 81 mg and ACE inhibitor given  that she is diabetic and      has hypertension.  2. Iron deficiency anemia.  The patient was told that one-time that      she is iron deficient was supposed to be taking iron tablets.  Per      patient report, she stop taking iron tablets in December 2090 or      January 2010.  She also states that she is currently menstruating.      Her hemoglobin on admission was 8.2 and an iron panel that was      ordered showed that she is iron deficient.  The patient is advised      to continue taking iron tablets as prescribed for her during this      hospitalization.  3. Diabetes.  The patient has a hemoglobin of 6.9, but she was      admitted with hyperglycemia with a glucose more than 300.  She has      been taking metformin 850 mg p.o. once daily.  We have decided to      increase her metformin to 1000 p.o. daily.  She will be followed by      her primary care doctor for any additional changes to her      medication.  4. Gastroesophageal reflux disease.  The patient's history does      present for a similar to reflux disease.  She has been told in the      past that she does have reflux and should have been taking a PPI.      The patient said that she cannot afford one.  At discharge, the      patient will be given a prescription for both Prevacid and  ranitidine.  5. Hyperlipidemia.  The patient will be discharged with a prescription      for pravastatin 80 mg p.o. nightly since pravastatin is under 4      dollar formulary at [employer].  LDL goal for her would be less than      70.  Her last LDL in July 2009 was 109.   DISCHARGE INSTRUCTIONS:  1. Activity:  Increase activity slowly.  2. Diet:  Low-sodium heart-healthy diet and carb modified diet.  3. Wound care not applicable.   FOLLOWUP APPOINTMENTS:  1. Dr. Eustaquio Boyden at the [facility] on April 08, 2009      at 4 o'clock.  2. [facility] on April 20, 2009 at 9:30, phone number [phone].      The patient  discharged home in stable medical condition.      Meredith Slim, MD  Electronically Signed      Arnette Norris. Sheffield Slider, M.D.  Electronically Signed    CT/MEDQ  D:  03/22/2009  T:  03/23/2009  Job:  [id number]   cc:   Eustaquio Boyden, MD

## 2011-03-18 NOTE — Discharge Summary (Signed)
Meredith Maynard, Meredith Maynard  ACCOUNT NO.:  [id number]   MEDICAL RECORD NO.:  [id number]          PATIENT TYPE:  INP   LOCATION:  5730                         FACILITY:  [facility]   PHYSICIAN:  Ollen Gross. Vernell Morgans, M.D. DATE OF BIRTH:  [birth year]   DATE OF ADMISSION:  09/21/2006  DATE OF DISCHARGE:  09/26/2006                               DISCHARGE SUMMARY   CHIEF COMPLAINT AND REASON FOR ADMISSION:  Meredith Maynard is a 45-  year-old female patient who presented to the ER with abdominal pain.  Her initial complaints were of right upper quadrant abdominal pain for  which surgery was consulted.  CT scan was obtained prior to surgical  evaluation and demonstrated a loop of bowel incarcerated in the  periumbilical sack along with a benign lower abdominal Pfannenstiel  hernia.  Because of the findings of incarcerated hernia, surgical  consultation was requested.  It is important to note that the patient  speaks [place] but is somewhat limited.  Her language is [place].   On exam, she was found to be an obese female in mild distress.  The ER  physician did attempt to reduce the hernia but was unsuccessful.  The  abdomen was soft, not tender, no peritoneal signs.  She had a very  obvious incarcerated periumbilical hernia with purple discoloration to  the left of the umbilicus.   The patient had a hemoglobin of less than 10, hematocrit 31%,  platelets  128,000, and creatinine 0.8.   ADMITTING DIAGNOSES:  1. Small bowel obstruction secondary to incarcerated umbilical hernia,      as well as a benign lower abdominal Pfannenstiel ventral hernia.  2. Anemia of uncertain etiology.  3. Diabetes.  4. Hypertension.   HOSPITAL COURSE:  The patient was admitted by Dr. Lindie Spruce where she was  taken directly from the ER to the operating room where she underwent  repair of umbilical hernia, as well as enterotomy for the incarcerated  loop of ischemic small bowel that was within the umbilical  hernia.  The  patient tolerated the procedure well and was sent to the general floor.  Over the next few days, the patient slowly progressed.  She had an NG  inserted in the immediate operative period.  By postoperative day #1,  she was able to have the NG tube out.  She was afebrile.  Dressing was  dry and intact.  Throughout the next several days, pain seemed to be a  significant issue.  Her diet was gradually advanced, and by  postoperative day #3, she was tolerating a full liquid diet.  She was  having bowel movements and flatus.  Her diet was advanced to solid, the  PCA was discontinued, she was changed over to oral Vicodin, and plans  were to discharge home was tolerating a solid diet.   On postoperative day #4, medical exam was unremarkable.  She was  tolerating a solid diet and all pain pills.  Her midline incision was  clean, dry and intact.  Staples were intact.  No drainage.  In addition,  the patient is diabetic and was out of her usual medications at home so  a one-time prescription for  her oral hypoglycemic medications were  ordered.   FINAL DISCHARGE DIAGNOSES:  1. Incarcerated umbilical hernia with ischemic small bowel status post      enterotomy.  2. Repair of her lower Pfannenstiel ventral hernia, nonobstructive.  3. Diabetes mellitus.  4. Hypertension.  5. Anemia of uncertain etiology.  Initial hemoglobin on admission was      9.6.   DISCHARGE MEDICATIONS:  1. Resume home medications.  2. Glucophage 500 mg daily.  3. Vicodin 5/500 1-2 pills every 4 hours as needed for pain, #40, no      refills.   Return to work no sooner than October 23, 2006.  This will be  determined by Dr. Dixon Boos follow-up.   ACTIVITY:  Increase activity slowly.  May shower.  May walk up steps.  Sexual activity when comfortable.  No driving while taking the Vicodin.  No lifting greater than 15 pounds for 5 weeks.   WOUND CARE:  Keep staples clean and dry.   FOLLOWUP:  She will  return to the office to see Dr. Lindie Spruce on Thursday,  September 28, 2006 at 2:15 p.m.  The patient is also instructed to bring  a translator with her to the appointment with Dr. Lindie Spruce.   ADDITIONAL INSTRUCTIONS:  The patient is to call the surgeon if (a)  fever more than 101 degrees Fahrenheit, (b) drainage or redness at  wound, (c) nausea, vomiting, diarrhea, or (d) new or increased belly  pain.      Meredith Maynard, N.POllen Gross. Vernell Morgans, M.D.  Electronically Signed    ALE/MEDQ  D:  11/14/2006  T:  11/14/2006  Job:  [id number]   cc:   Cherylynn Ridges, M.D.

## 2011-03-18 NOTE — Op Note (Signed)
Meredith Maynard, Meredith Maynard  ACCOUNT NO.:  [id number]   MEDICAL RECORD NO.:  [id number]          PATIENT TYPE:  INP   LOCATION:  1829                         FACILITY:  [facility]   PHYSICIAN:  Cherylynn Ridges, M.D.    DATE OF BIRTH:  Mar 27, 1966   DATE OF PROCEDURE:  09/22/2006  DATE OF DISCHARGE:                                 OPERATIVE REPORT   PREOPERATIVE DIAGNOSIS:  Incarcerated umbilical hernia with entrapped small  bowel and small bowel obstruction.   POSTOPERATIVE DIAGNOSIS:  Incarcerated umbilical hernia with entrapped small  bowel and small bowel obstruction with ischemia of small bowel segment.   PROCEDURE:  1. Umbilical hernia repair without mesh.  2. Small bowel resection with primary anastomosis.   SURGEON:  Cherylynn Ridges, M.D.   ANESTHESIA:  General endotracheal.   ESTIMATED BLOOD LOSS:  Less than 50 cc.   COMPLICATIONS:  None.   CONDITION:  Stable.   FINDINGS:  The patient had a knuckle of small bowel approximately 4 inches  long which was ischemic and hernia sac in the periumbilical area without  frank necrosis but that may be a small area perforation.   INDICATIONS FOR OPERATION:  The patient is a 45 year old with an  incarcerated periumbilical hernia with small bowel obstruction who now comes  in for repair.   OPERATION:  The patient was taken to the operating room and placed on table  in supine position.  After an adequate endotracheal anesthetic was  administered she was prepped and draped in usual sterile manner exposing the  periumbilical area.   A midline incision was made from above the umbilicus to below it and to the  right of the umbilicus.  There was purplish discoloration of the skin  underneath the incarcerated hernia on the left side.   Once we were in the subcutaneous tissue, we dissected down around the hernia  sac to the fascial layer.  This required as dissecting away the skin of the  umbilicus away from the hernia sac to the left  side.  Once we had  circumferentially dissected down to the level of the neck of the hernia sac  which probably measured about 3 cm long, we opened the sac at the base using  electrocautery.  There was an ischemic loop of small bowel which was found  within the sac.  We resected the sac, enlarged the opening to approximately  6 cm in order to bring out more loops of bowel so that we could do a small  bowel resection safely.   We resected about a 4 inches segment of small bowel doing a primary  anastomosis with a GIA 75 stapler and a TA-60 stapler.  We closed the  mesentery using 2-0 silk sutures.   We resected a small bowel also using GIA staplers.  We allowed the  reanastomosed bowel to go back into the peritoneal cavity and then we closed  the fascial defect using interrupted #1 Novofil sutures of simple stitches  were placed and then a running #1 Novofil suture on top of the interrupted  sutures.  We irrigated the subcu with saline copiously as there was a small  amount  of spillage of small bowel contents into the wound and then we closed  in two layers of 3-0 Vicryl deep layer and then the skin was closed using  stainless steel staples.  Sterile dressing was applied.  No Marcaine was  injected.  All needle counts, sponge counts and instrument counts were  correct.      Cherylynn Ridges, M.D.  Electronically Signed     JOW/MEDQ  D:  09/22/2006  T:  09/22/2006  Job:  [id number]

## 2011-03-18 NOTE — Discharge Summary (Signed)
NAMEPERRIS, TRIPATHI  ACCOUNT NO.:  [id number]   MEDICAL RECORD NO.:  [id number]          PATIENT TYPE:  OBV   LOCATION:  4703                         FACILITY:  [facility]   PHYSICIAN:  Altamese Cabal, M.D.  DATE OF BIRTH:  11/17/1965   DATE OF ADMISSION:  11/30/2005  DATE OF DISCHARGE:  12/01/2005                                 DISCHARGE SUMMARY   DISCHARGE DIAGNOSES:  1.  Atypical chest pain.  2.  Gastroesophageal reflux disease.  3.  Type 2 diabetes.  4.  Hypertension.  5.  Hypokalemia.  6.  Anxiety disorder, not otherwise specified.   DISCHARGE MEDICATIONS:  1.  Lisinopril 10 mg p.o. daily.  2.  Glipizide 5 mg p.o. daily.  3.  Prilosec OTC 20 mg p.o. daily.  4.  Aspirin 81 mg p.o. daily.   BRIEF HISTORY OF PRESENT ILLNESS:  This is a 45 year old female who  presented to the hospital with intermittent left arm pain, numbness and  weakness that had been going on for several months now.  Due to her having  several risk factors for acute coronary syndrome, she was admitted to the  hospital to rule out an MI and for observation.  She was also hypokalemic  with a potassium around 3.0.  On exam, there were no focal neurologic  deficits.  Her EKG on admission was normal.   HOSPITAL COURSE BY PROBLEM:  PROBLEM #1 -  ATYPICAL CHEST PAIN:  The patient  was ruled out for an MI with an EKG and three sets of negative cardiac  enzymes.  It was thought that her pain may have an anxiety component to it.  The patient's primary care physician may want to do an outpatient stress  test on her.  We started her on an aspirin 81 mg daily while she was in the  hospital.  The day following her admission, her initial symptoms had  resolved and her chest pain was more epigastric and associated with food  intake.   PROBLEM #2 -  GASTROESOPHAGEAL REFLUX DISEASE:  The patient was put on  Protonix on the day of discharge and was discharged home on Prilosec OTC.  The patient had already  been prescribed with medicine but she had not taken  it for an unknown reason.  We checked a serum H. pylori antibody for the  possibility of peptic ulcer disease in this patient.  This is pending at the  time of discharge.   PROBLEM #3 -  TYPE 2 DIABETES:  We continued her on glipizide.  Her blood  sugars remained stable between 100 and 200 while she was here in the  hospital.   PROBLEM #4 -  HYPERTENSION:  Blood pressures remained stable during her  hospitalization here on hydrochlorothiazide.  However, we decided to change  this to a different medication since she was having significant hypokalemia  and was not compliant with taking positive supplementation.  We therefore  started her on lisinopril 10 mg daily at the time of discharge and she will  need a follow-up BNP at her clinic appointment next week.   PROBLEM #5 -  HYPOKALEMIA:  Potassium was down around 3.0, this  was  supplemented and came up to 3.6 on the day of discharge again.  Again, she  will need a follow-up BNP as an outpatient.  We expect this to resolve,  however, with discontinuing the hydrochlorothiazide.   PROBLEM #6 -  ANXIETY DISORDER NOT OTHERWISE SPECIFIED:  The patient's  anxiety might be playing into her chest pain and she had been on fluoxetine  in the past but was noncompliant with this.   PROBLEM #7 -  CUSHINGOID FEATURES:  The patient's appearance was cushingoid  and her hypokalemia lead [place] to pursue a 24-hour urine-free cortisol on the  patient.  This was finished right around the time of discharge and can be  followed up by her primary care physician.   DISCHARGE INSTRUCTIONS:  1.  The patient is to take all medications as prescribed.  2.  She has a follow-up appointment with Dr. Dorathy Daft on December 09, 2005, at 3:30 p.m.  3.  She is to adhere to a heart healthy diabetic diet.  4.  She is to return to the hospital should she experience unrelenting chest      pain or see her primary care  physician.      Altamese Cabal, M.D.     KS/MEDQ  D:  12/01/2005  T:  12/01/2005  Job:  [id number]   cc:   Adrian Blackwater, MD  Fax: [phone]

## 2011-03-18 NOTE — Op Note (Signed)
NAMEANALYCE, TAVARES                      ACCOUNT NO.:  [id number]   MEDICAL RECORD NO.:  [id number]                   PATIENT TYPE:  INP   LOCATION:  9198                                 FACILITY:  [facility]   PHYSICIAN:  Phil D. Okey Dupre, M.D.                  DATE OF BIRTH:  1966/02/02   DATE OF PROCEDURE:  02/04/2003  DATE OF DISCHARGE:                                 OPERATIVE REPORT   PROCEDURE:  Low transverse cesarean section.   PREOPERATIVE DIAGNOSES:  1. Repeat cesarean section.  2. A 39-week pregnancy.  3. Insulin-dependent gestational diabetic.  4. Morbid obesity.   POSTOPERATIVE DIAGNOSES:  1. Repeat cesarean section.  2. A 39-week pregnancy.  3. Insulin-dependent gestational diabetic.  4. Morbid obesity.  5. Extension of uterine incision into the area near the left broad ligament.   SURGEON:  Javier Glazier. Okey Dupre, M.D.   FIRST ASSISTANT:  Zane Herald, M.D.   OPERATIVE FINDINGS:  A female infant with a 9/9 Apgar delivered from an ROT  presentation and immediately after delivery it is noted that there was a  hematoma forming in the area of the left broad ligament.  This area was  opened and blood immediately poured out of the opening  Digital compression  from behind the broad ligament was carried out to control bleeding while  several figure-of-eight sutures were placed into to eventually control the  bleeding.  At times it was difficult to see because of the welling up in  between sutures.   PROCEDURE:  Under satisfactory spinal anesthesia the patient in a dorsal  lithotomy position, a Foley catheter in the urinary bladder.  The abdomen  was pulled up and taped so that the panus was pulled up caudally.  The  abdomen was then prepped and draped in the usual sterile manner, entered  through a transverse incision just below the previous surgical incision  which was really low upon the pannus, so we were about 5 cm above the  symphysis pubis.  Abdomen was entered down to the  fascia which was opened  transversely by sharp dissection and to avoid getting into the bladder we  dissected up under the fascia until we could see peritoneum clearly which  was entered.  The bladder then taken down and some bladder retractor to  expose the lower uterine segment.  The visceroperitoneum on the anterior  surface of the uterus was opened transversely just above the bladder which  was pushed down and the uterus entered by sharp and then blunt dissection.  The baby was easily delivered aforementioned and had a nuchal cord and a  cord around the arm.  The cords were doubly clamped, divided, and baby  handed to pediatrician.  The placenta spontaneously removed and the uterus  explored.  The cervix was dilated with the ring forceps and closure was  started with a 0 Vicryl suture on atraumatic needle.  After  approximately  three stitches we noticed a large hematoma forming in the area of the left  broad ligament, palpated, opened slightly, and blood extruded very quickly  out of that area.  Using digital compression the posterior portion of broad  ligament and the vessels low figure-of-  eight sutures were placed in the area to control the bleeding which was  welling up in between the suture tie.  The bleeding was then controlled, but  not before significant blood loss had occurred.  The uterus was then  continued to be closed with a continuous running locked 0 Vicryl suture with  some oozing at the suture site so a second layer of __________ suture was  used to reinforce the first using the same suture.  The area was observed  for bleeding behind the broad ligament.  None was noted.  No hematoma  formation could be seen at this time.  The ovary and tube on that side were  then found to be normal also.  Then the tape count was correct and the area  was irrigated and the fascia closed with continuous running alternating  locked 0 Vicryl on an atraumatic needle.  Subcutaneous  approximation was  carried out with several interrupted 0 Vicryl sutures and skin edge  approximated with skin staples.  A dry sterile dressing was applied.  The  patient was transferred to recovery room in satisfactory condition.  Approximately 2500 mL blood loss, although the patient remained stable  during the entire time.  It was decided to transfuse her 2 units  postoperatively and then observe.  Foley catheter was draining clear amber  urine at the end of the procedure.                                               Phil D. Okey Dupre, M.D.    PDR/MEDQ  D:  02/04/2003  T:  02/04/2003  Job:  [id number]

## 2011-03-18 NOTE — H&P (Signed)
Meredith Maynard, Meredith Maynard  ACCOUNT NO.:  [id number]   MEDICAL RECORD NO.:  [id number]          PATIENT TYPE:  INP   LOCATION:  1829                         FACILITY:  [facility]   PHYSICIAN:  Cherylynn Ridges, M.D.    DATE OF BIRTH:  06-08-1966   DATE OF ADMISSION:  09/21/2006  DATE OF DISCHARGE:                                HISTORY & PHYSICAL   IDENTIFICATION/CHIEF COMPLAINT:  The patient is a 45 year old with an  incarcerated umbilical hernia with small bowel obstruction.   HISTORY OF PRESENT ILLNESS:  I was contacted about 5:45 this morning by the  patient with a small bowel obstruction.  She initially came in complaining  of right upper quadrant abdominal pain, however the umbilical hernia which  was incarcerated was not appreciated.  She went ahead and got a CT scan  which showed a loop of bowel incarcerated in the periumbilical sac along  with a benign lower abdominal Pfannenstiel hernia.  Once the obstructive was  noted to be secondary to the incarcerated umbilical hernia with small bowel  contents, surgical consultation was obtained.   PAST MEDICAL HISTORY:  1. Non-insulin-dependent diabetes mellitus which she has some medicines      treating; however, she has recently attempted diet control although she      is morbidly obese.  2. She also has a history of hypertension.   PAST SURGICAL HISTORY:  She has had C-section x2.   MEDICATIONS:  Accupril dose unknown.  Aspirin.  Glucotrol doses unknown.   ALLERGIES:  She has no known drug allergies.   REVIEW OF SYSTEMS:  She has only had abdominal pain, nausea, vomiting for  only a few hours and she has only vomited twice.   PHYSICAL EXAMINATION:  GENERAL APPEARANCE:  On examination, she is an obese  female in only mild acute distress.  She has received pain medication for  her incarcerated hernia. Attempts were made to reduce in the ED which were  unsuccessful.  VITAL SIGNS:  She is afebrile, other vital signs stable.   She is not  hypertensive at this point.  She does not look septic.  NECK:  Supple, although thick, no palpable masses.  LUNGS:  Clear to auscultation.  CARDIAC:  Regular rhythm and rate with no murmurs, gallops, rubs or heaves.  ABDOMEN:  Soft and not generally tender.  No generalized peritonitis.  She  has a very obvious incarcerated periumbilical hernia with some purplish  discoloration to the left of the umbilicus.  No diffuse peritonitis again.  RECTAL/PELVIC:  Not performed.   LABORATORY STUDIES:  She is slightly anemic with hemoglobin of less than 10,  hematocrit of 31%, and platelets around 128,000.  Creatinine is 0.8.   I have reviewed the CT scan which demonstrated a small bowel obstruction  with proximal dilatation, distal decompression and a loop of bowel in the  periumbilical hernia sac.  She also has a lower abdominal benign hernia near  area of her previous Pfannenstiel.   IMPRESSION:  1. Small bowel obstruction secondary to incarcerated umbilical hernia with      loops small-bowel contained within.  2. Benign lower abdominal Pfannenstiel ventral hernia.  3.  Anemia of unknown etiology.  A workup to ensued postop but likely      secondary to just menses without proper replacement of iron and      vitamins.  4. Non-insulin-dependent diabetes mellitus.  5. Hypertension.   PLAN:  The plan is to take her to the operating room as soon as possible  after getting some perioperative antibiotics of Kefzol 2 grams intravenous  piggyback and to reduce and possibly resect small-bowel depending upon its  vascular status.  This has been explained to the patient.  We will proceed  as soon as possible.      Cherylynn Ridges, M.D.  Electronically Signed     JOW/MEDQ  D:  09/22/2006  T:  09/22/2006  Job:  [id number]

## 2011-03-18 NOTE — Discharge Summary (Signed)
NAME:  Meredith Maynard, Meredith Maynard              ACCOUNT NO.:  [id number]   MEDICAL RECORD NO.:  [id number]                   PATIENT TYPE:  INP   LOCATION:  9141                                 FACILITY:  [facility]   PHYSICIAN:  Phil D. Okey Dupre, M.D.                  DATE OF BIRTH:  14-Mar-1966   DATE OF ADMISSION:  12/10/2003  DATE OF DISCHARGE:                                 DISCHARGE SUMMARY   DISCHARGE DIAGNOSES:  1. Type 2 diabetes.  2. Gestational diabetes.  3. Intrauterine pregnancy at 9 weeks.   DISCHARGE MEDICATIONS:  1. NPH insulin 44 units before breakfast and 18 units each evening.  2. Regular insulin 22 units each morning and 18 units each evening.   SPECIAL INSTRUCTIONS:  1. The patient instructed to follow diabetic diet gone over while admitted.  2. Check CBGs each morning before breakfast and two hours after each meal.   FOLLOW-UP:  The patient has an appointment at high risk clinic next  Wednesday, December 24, 2003 at 9 a.m.   BRIEF ADMISSION HISTORY:  This is a 45 year old G3 P2 who presented at 7 and  [redacted] weeks gestation by ultrasound who was sent from clinic for increased blood  glucose.   HOSPITAL COURSE:  Hemoglobin A1c was obtained which was 9.8.  Purpose of  hospital admission was to gain control of blood glucose levels considering  early gestational age.  During hospitalization the patient had one episode  of vaginal bleeding for which she had a follow-up ultrasound for viability  considering risk of threatened abortion.  Infant viable, doing well on  ultrasound.  Through her hospitalization the patient was placed on NPH and  regular insulin regimen and was adjusted to the doses listed above which  kept her blood glucose levels less than 150 at all times.  The patient did  not have any other complications during her hospitalization.     Anastasio Auerbach, MD                          Phil D. Okey Dupre, M.D.    AD/MEDQ  D:  12/17/2003  T:  12/18/2003  Job:  [id number]

## 2011-03-18 NOTE — Op Note (Signed)
NAMESKYLA, CHAMPAGNE              ACCOUNT NO.:  [id number]   MEDICAL RECORD NO.:  [id number]                   PATIENT TYPE:  INP   LOCATION:  9199                                 [facility]ACILITY[facility]   PHYSICIAN:  Phil D. Okey Dupre, M.D.                  DATE OF BIRTH:  [birth year]   DATE OF PROCEDURE:  06/14/2004  DATE OF DISCHARGE:                                 OPERATIVE REPORT   PREOPERATIVE DIAGNOSIS:  34+ weeks gestation with pre-eclampsia, diabetes  and impending HELP syndrome.   POSTOPERATIVE DIAGNOSIS:  34+ weeks gestation with pre-eclampsia, diabetes  and impending HELP syndrome.  Frank breech.   OPERATION PERFORMED:  Low transverse cesarean section, bilateral tubal  ligation with right partial salpingectomy.   SURGEON:  Javier Glazier. Okey Dupre, M.D.   ASSISTANT:  Conni Elliot, M.D.   ESTIMATED BLOOD LOSS:  600 mL.   ANESTHESIA:  Epidural.   POSTOPERATIVE CONDITION:  Satisfactory.   SPECIMENS:  Placenta.  A portion of the right fallopian tube.   DESCRIPTION OF PROCEDURE:  Under satisfactory epidural anesthesia, patient  in dorsal supine position, Foley catheter in the urinary bladder, the  abdomen was prepped and draped in the usual sterile manner.  I entered  through a transverse low abdominal incision in the area of a previous scar  after the abdomen taped up because of the immense obesity of the patient.  On entering the subcutaneous tissue was taken down to the fascia which was  opened by sharp dissection and at this point the abdomen was by layers. On  entering the peritoneal cavity, the visceral peritoneum and the anterior  surface of the uterus was opened transversely and the bladder pushed away  from the uterine segment.  It was entered by sharp and blunt dissection and  from a LSA presentation, a frank breech extraction was done, cord doubly  clamped, divided, baby handed to the pediatrician.  Specimen of blood taken  from the cord for analysis and the  placenta which was fundal was manually  removed.  The uterus was explored and closed with a continuous running  locked 0 Vicryl suture on an atraumatic needle. The area of the right angle,  several figure-of-eight sutures were placed in that because of a small  amount of oozing.  Irrigation failed to reveal any more bleeding at this  point. The left fallopian tube was sealed with a Filshie clip as was the  right; however, the right tube was not satisfied that the entire tube was  within the ostium of the clip, so a 0 plain suture was placed through the  mesosalpinx beneath the tube and tied around the distal and proximal ends  form a loop approximately 2 cm above the tie and sectioned the tube above  the tie, removed after a second suture was tied just below the  aforementioned tie and the ends were freed up until the tube was then  coagulated with eye cautery.  Therefore,  the only specimens sent for  pathologic diagnosis was the right tube.  The other seemed well sealed with  a Filshie clip. Attention was then turned back to the uterine incision where  there was a small amount of oozing controlled with figure-of-eight suture of  0 Vicryl.  Several small bleeders on the rectus muscle on the right side  were also controlled with hot cautery and the area was irrigated. The fascia  was closed with continuous running 0 Vicryl on an atraumatic needle and then  subcutaneous approximation was carried out with 2-0 plain catgut suture.  A  #10 Jackson-Pratt drain was placed in the wound and exited through a stab  wound to the left and just above the left angle of the wound through a  separate opening and the skin edge approximated with skin staples.  The dry  sterile dressing was applied.  The patient was then transferred to the  recovery room in satisfactory condition having tolerated the procedure well.  Tape, sponge and needle count were reported correct at the end of the  procedure and the Foley  catheter was draining clear amber urine.                                               Phil D. Okey Dupre, M.D.    PDR/MEDQ  D:  06/14/2004  T:  06/14/2004  Job:  [id number]

## 2011-03-18 NOTE — Discharge Summary (Signed)
Meredith Maynard, Meredith Maynard              ACCOUNT NO.:  [id number]   MEDICAL RECORD NO.:  [id number]                   PATIENT TYPE:  INP   LOCATION:  9114                                 [facility]ACILITY[facility]   PHYSICIAN:  Phil D. Okey Dupre, M.D.                  DATE OF BIRTH:  Nov 27, 1965   DATE OF ADMISSION:  06/09/2004  DATE OF DISCHARGE:  06/18/2004                                 DISCHARGE SUMMARY   ADMISSION DIAGNOSES:  1. Preeclampsia.  2. Diabetes mellitus B.   DISCHARGE DIAGNOSES:  Postoperative day #4, repeat low transverse cesarean  section.   PROCEDURES:  Repeat low transverse cesarean section.   BRIEF SUMMARY:  This is a 45 year old G3, para 3-0-3, previous C-section x2,  who received prenatal care beginning at 55 weeks' gestational age at high-  risk clinic [facility].  The patient is obese, AMA positive, with diabetes  mellitus type B.  Since first trimester controlled with insulin, CBGs within  normal limits.  The patient developed preeclampsia at 33 weeks with weight  gain total of 47 pounds during pregnancy at that moment and 9-1/2 pounds in  the last seven days.  The patient was admitted on June 08, 2004, with  diagnosis of preeclampsia, complaining of headache and increased blood  pressure and with marked edema in hands, legs, and feet.   HOSPITAL COURSE:  The patient was stable during all hospital course, having  mild increase in blood pressure and marked edema on hands, face, and legs,  otherwise asymptomatic.  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  The  patient also complains of headaches, no __________ any dyspneic, no  complaints of chest pain, shortness of breath, or palpitations during the  hospital course.  The patient received betamethasone 12.5 mg x2 on August 12  and August 13 for fetal lung maturity at 34 weeks 1 day.  On August 11-15,  the patient's [facility] panel had changed with an increase in liver function tests  and relative thrombocytopenia.  CBGs  were in the range of 123-187, and blood  pressure was stable during these days.  The patient was put on magnesium  treatment and insulin.  On day August 15, the patient had increase of edema  and her platelets were stable on the low range.  The patient had low  transverse C-section plus bilateral tubal ligation with epidural anesthesia.  The baby was breech presentation, and the cesarean section was done because  of preeclampsia with impending HELLP syndrome.  On August 16, CBGs were from  64-82, magnesium 3.8, and patient on postop day #1 was pretty much stable  with increased diuresis, 260 per hour.  Liver function tests were stable.  Insulin was held and magnesium was continued, trying to normalize laboratory  results.  August 17, continued with magnesium and the patient was started on  insulin home regimen, trying to get better control of diabetes.  Since CBGs  were trending toward the normal to low  level, SSI insulin was held and the  patient switched to Glucotrol XL 5 mg daily.  Her magnesium was  discontinued.  On August 18, postop day #3, the patient was stable and  continued only with hypertensive treatment, hydrochlorothiazide 25 mg p.o.  daily and Glucotrol XL for control of the glucose intolerance.  On August 19  the patient was still stable, feeling much better after C-section, minimum  pain, had bowel movement, and on lab tests and improvement was noticed on  platelets, going from 60 to 96, glucose was well-controlled and on the value  of 88, hemoglobin was 8.5, and liver function tests were toward normal  values, SGOT 29, SGPT 55, LDH 258, and urinalysis was negative.  The patient  was discharged on June 18, 2004, with medications:   1. Glucotrol XL 5 mg daily.  2. Hydrochlorothiazide 25 mg daily for high blood pressure.  3. Ferrous sulfate 325 mg b.i.d. with meals for the anemia.   The patient had appointment on September 27 at 2 p.m. at family practice  clinic with Dr.  Dorathy Daft and was instructed to come back to [facility] on  Monday for staple removal.  Also was instructed to keep record of CBG values  by Glucometer at home and also blood pressure values to take to her six-week  appointment.  Also, the patient received instructions on lactation and  combined bottle feeding for this baby.     Adrian Blackwater, MD                  Javier Glazier. Okey Dupre, M.D.    IM/MEDQ  D:  06/20/2004  T:  06/21/2004  Job:  [id number]   cc:   Adrian Blackwater, MD  Fax: [phone]

## 2011-03-18 NOTE — Discharge Summary (Signed)
NAMEJOLISA, Meredith Maynard              ACCOUNT NO.:  [id number]   MEDICAL RECORD NO.:  [id number]                   PATIENT TYPE:  INP   LOCATION:  9137                                 [facility]AC[facility]   PHYSICIAN:  Phil D. Okey Dupre, M.D.                  DATE OF BIRTH:  15-May-1966   DATE OF ADMISSION:  02/04/2003  DATE OF DISCHARGE:  02/07/2003                                 DISCHARGE SUMMARY   DISCHARGE DIAGNOSES:  1. Repeat low transverse cesarean section.  2. Delivery of a viable term infant.  3. Insulin-dependent gestational diabetes.  4. Morbid obesity.  5. Anemia.  6. Advanced maternal age.   DISCHARGE MEDICATIONS:  1. Ibuprofen 600 mg p.o. q.6h. p.r.n. pain.  2. Percocet 5/325 mg one tab every four hours as needed for pain.  3. Prenatal vitamins, one vitamin p.o. daily.  4. Ferrous sulfate 325 mg p.o. daily for anemia.  5. Colace 100 mg p.o. daily.   DISCHARGE INSTRUCTIONS:  1. Wound care.  Keeping her wounds clean and dry.  2. Return to maternity admissions at [facility] on Monday, February 10, 2003 for staple removal.  3. Diet, normal .  4. Activity no heavy lifting for four weeks.  5. Nothing per vaginal for six weeks.   FOLLOW UP:  Return to [facility] in six weeks.   BRIEF ADMISSION HISTORY:  The patient is a 45 year old G2, P1-0-0-1 who was  admitted for repeat C section at [redacted] weeks gestation.  This pregnancy was  complicated by insulin-dependent gestational diabetes.  The patient also has  a history of pregnancy induced hypertension this pregnancy.  On admission  she denied contractions, ruptured membranes or vaginal bleeding and reported  good fetal activity.  She had been followed as an adopt a Mom and then  through high risk clinic during this pregnancy.   OBSTETRICAL HISTORY:  Includes previous C sections as well as previous GBS  positive cultures.   MEDICATIONS:  1. Include prenatal vitamins.  2. NPH insulin.   ALLERGIES:   None.   PRENATAL LABORATORY:  Included hemoglobin 11.7, platelets 140 on April 4.  Negative gonorrhea and Chlamydia.  Positive group B strep on November 20, 2002.   PHYSICAL EXAMINATION:  VITAL SIGNS:  The patient had a blood pressure of  100/76 with a pulse of 60.  She weighed 263 pounds and was 5 foot 5 inches  tall.  PELVIC:  Digital cervical exam showed a long, closed cervix with the fetus  in vertex presentation.  Fetal heart rate showed a baseline of 145 with good  variability, reactivity and no deceleration.  No uterine contractions were  noted.   HOSPITAL COURSE:  On February 04, 2003 a repeat low transverse cesarean section  was preformed with delivery of a viable female infant with Apgar's of 9 at one  minute and 9 at five minutes.  Infant weighed 9 pounds 3 ounces.  Surgery  was complicated by extension of the uterine incision into the area near the  left broad ligament.  A large hematoma was noted to form in the area of the  left broad ligament immediately after the delivery of the infant.  Digital  compression and suturing were preformed to control the bleeding.  Hemostasis  was achieved but it was thought that approximately 2,500 cc of blood loss  occurred.  The patient remained stable throughout the entire procedure but  the decision was made to transfuse her with two units packed red blood cells  postoperatively.   Postop day number one, the patient was recovering well, passing flatus.  Her  abdomen was soft, flat with active bowel sounds and mild tenderness to  palpation.  Hemoglobin preoperatively was 11.6 and remained stable around 11-  11.5 on postop day number zero and number one; therefore, it was thought  that there was probable significant over exaggeration of the blood loss  during surgery.  It was probably closer to 1 liter.   Throughout the remainder of her hospitalization the patient continued to  recover well with mild tenderness to palpation of the umbilicus.   Her  incision remained well approximated with staples.  It was clean, dry and  intact.  Blood pressures remained good with systolic ranging from the 80s-  120s and diastolic in the 50s-70s.  Blood sugars during hospitalization  ranged from 60-79.  The patient was breast feeding well and ambulating  without difficulty.  The patient was started on iron 325 mg p.o. daily on  postop day number two when her hemoglobin decreased slightly to 9.8.   On the day of discharge the patient continued to do well and was passing  flatus and she had a small bowel movement.  The decision was made to keep  her keep her stable as an inpatient till postop day number six secondary to  her morbid obesity.  The patient desired no contraception while breast  feeding secondary to problems with her last pregnancy and her milk supply  drying up on Depo-Provera.  It was explained to her that she was still at  risk for becoming pregnant even while breast feeding.  She was discharged  home on February 07, 2003 and to follow up for her first post-partum check at  [facility].     Georgina Peer, M.D.                 Phil D. Okey Dupre, M.D.    JM/MEDQ  D:  02/11/2003  T:  02/11/2003  Job:  [id number]

## 2011-04-18 ENCOUNTER — Encounter: Payer: Self-pay | Admitting: Family Medicine

## 2011-04-18 ENCOUNTER — Ambulatory Visit (INDEPENDENT_AMBULATORY_CARE_PROVIDER_SITE_OTHER): Payer: Self-pay | Admitting: Family Medicine

## 2011-04-18 VITALS — BP 110/67 | HR 87 | Temp 98.6°F | Ht 65.5 in | Wt 248.0 lb

## 2011-04-18 DIAGNOSIS — E119 Type 2 diabetes mellitus without complications: Secondary | ICD-10-CM

## 2011-04-18 DIAGNOSIS — M5431 Sciatica, right side: Secondary | ICD-10-CM

## 2011-04-18 DIAGNOSIS — M5430 Sciatica, unspecified side: Secondary | ICD-10-CM

## 2011-04-18 HISTORY — DX: Sciatica, right side: M54.31

## 2011-04-18 MED ORDER — DICLOFENAC-MISOPROSTOL 75-0.2 MG PO TABS: 1.0000 | ORAL_TABLET | Freq: Two times a day (BID) | ORAL | Status: DC

## 2011-04-18 NOTE — Progress Notes (Signed)
  Subjective:    Patient ID: Meredith Maynard, female    DOB: 1966/08/02, 45 y.o.   MRN: [id number]  HPI  1. LE Pain: Left, started Saturday (3 days ago), pain described as acute onset of ache/numb in anterior-medial shin - now intermittent. Associated with lumbar pain and radicular pain down entire leg yesterday. Pain unchanged. No bowel/bladder incontinence, no saddle anesthesia, no fall, no foot drop. Taking OTC medication for pain that is not helping and she cannot remember the name of it. Hx of similar episode in past - MSK. Works in housekeeping, lifting and bending all day. Hx DM - last A1c 7.7, due for another.  Review of Systems SEE HPI.    Objective:   Physical Exam  Vitals reviewed. Constitutional: She appears well-developed and well-nourished.  Musculoskeletal:       TTP along lumbar spinous processes and bilateral lumbar paraspinal mm. Normal ROM. Neg SLR bilaterally. TTP along ant-med shin of left leg. No noticeable edema or skin changes compared to left. Normal distal pulses. Normal reflexes. Normal strength bilaterally.   Skin: Skin is warm and dry.      Assessment & Plan:

## 2011-04-18 NOTE — Assessment & Plan Note (Signed)
Due for A1c today. Will obtain as patient is having peripheral nerve pain/numbness and have patient see PCP in 1 week.

## 2011-04-18 NOTE — Assessment & Plan Note (Signed)
No red flags. Discussed Dx, symptomatic Tx, red flags, and future precautions. Dr. Sheffield Slider interpretor. Rx Diclofenac. Excuse for work provided. Follow up in 1 week with PCP for recheck and to discuss chronic diseases.

## 2011-04-18 NOTE — Patient Instructions (Signed)
PLEASE FOLLOW UP WITH DR. MATTHEWS IN 1 WEEK.  Radiculopata lumbar, Citica (Sciatica) La citica consiste en la debilidad y/o cambios en la sensibilidad (hormigueo, sacudidas, calor y fro, adormecimiento) a lo largo del recorrido del nervio citico. La irritacin o el dao de las races nerviosas lumbares generalmente se denominan radiculopata lumbar.  La radiculopata lumbar (citica) es la forma ms frecuente de este problema, pero una radiculopata puede ocurrir en cualquiera de los nervios que salen de la mdula espinal. Los problemas que causa dependen de qu nervios estn involucrados. El nervio citico es un gran nervio que suministra ramificaciones nerviosas que se dirigen a las extremidades inferiores (desde la cadera a los pies). La citica generalmente causa adormecimiento o debilidad en la piel y/o en los msculos que inerva el nervio citico. Tambin puede causar sntomas (problemas) como dolor, Maurice, hormigueo, sensacin de electricidad en el recorrido del nervio. Habitualmente se debe a una lesin de las fibras que forman el nervio citico. Algunos de estos sntomas son dolor de cintura y/o sensaciones desagradables en las siguiente reas:  Desde la zona media de la nalga hacia abajo, en la parte posterior de la pierna y hasta la rodilla.   Desde la salida de la pantorrilla hasta la parte superior del pie.   Detrs de Hydrographic surveyor interna del tobillo hasta la planta del pie.  ALGUNAS CAUSAS DE CITICA SON:  Disco herniado o desplazado. Los discos son pequeas almohadillas (porciones de disco) que se encuentran entre las vrtebras.   Presin ejercida por los [employer] piriformes de las nalgas sobre el nervio citico. (Sndrome piriforme).   Mala alineacin de los huesos de la cintura y las nalgas (trastorno de la Dispensing optician).   Estrechamiento del canal espinal que ejerce presin o pellizca las fibras que forman el nervio citico.   Desplazamiento de [place],  de modo que sale de la lnea en la que se encuentran las dems vrtebras.   Anormalidad del sistema nervioso de modo que las fibras nerviosas no transmiten las seales correctamente, especialmente a los pies y las pantorrillas (neuropata).   Tumor (no es frecuente).  El profesional puede determinar la causa de la citica y comenzar el tratamiento que tenga ms probabilidad de ser efectivo para usted. TRATAMIENTO Puede tomar medicamentos para Engineer, materials de venta [place], puede usar fisioterapia, reposo, ejercicios, masajes en la columna e inyecciones de anestsicos, corticoides o toxinas botulnicas. Tambin pueden ser [employer], la acupuntura y la [place]. Las tcnicas de Microbiologist u otras tcnicas de sugestin y la modificacin de ciertos factores como la cama, la silla, la altura del escritorio, la postura y [place] actividades son otros tratamientos que pueden ser de [place]. El profesional que lo asiste, junto con sus aportes, ayudarn a Chief Strategy Officer qu es lo mejor para usted. Con un correcto diagnstico (determinar cul es el problema), la causa de la mayor parte de los casos de citica podrn identificarse y solucionarse. La comunicacin y la cooperacin entre usted y el profesional que lo asiste es fundamental. Si no obtiene xito inmediatamente, no Engineer, materials, ya que con Museum/gallery conservator podr Contractor tratamiento adecuado que lo har mejorar. INSTRUCCIONES PARA EL CUIDADO DOMICILIARIO  Si el dolor proviene de un problema en la espalda, aplicar hielo en la zona durante 10 minutos, 3 veces por da, mientras se encuentre despierto, podr serle beneficioso. Ponga el hielo en una bolsa plstica y coloque una toalla entre la bolsa y la piel.   Puede practicar ejercicios o [employer]  actividades habituales si no agravan el dolor, o hgalo segn las indicaciones del profesional que lo asiste.   Utilice los medicamentos de venta libre o de prescripcin para Chief Technology Officer, Environmental health practitioner o  la [place], segn se lo indique el profesional que lo asiste.   Si el profesional que lo [employer] pide que concurra a una cita de seguimiento, es importante asistir a ella. No concurrir a la Holiday representative como consecuencia una lesin crnica o [place], dolor, e incapacidad. Si tiene algn problema para asistir a la cita, debe comunicarse con el establecimiento para obtener asistencia.  SOLICITE ATENCIN MDICA DE INMEDIATO SI:  Pierde el control de la vejiga o del intestino.   Aumenta la debilidad en el tronco, las nalgas o las piernas.   Siente que se adormece cualquier zona desde la cadera [employer] dedos de los pies.   Tiene dificultad para caminar o mantener el equilibrio.   Y si presenta los sntomas mencionados, junto con fiebre o vmitos intensos.  Document Released: 10/17/2005 Document Re-Released: 01/13/2009 [facility] Patient Information 2011 [place], [place].

## 2011-04-19 ENCOUNTER — Telehealth: Payer: Self-pay | Admitting: *Deleted

## 2011-04-19 NOTE — Telephone Encounter (Signed)
Mailed work excuse to patient.Busick, Meredith Maynard

## 2011-05-02 ENCOUNTER — Ambulatory Visit (INDEPENDENT_AMBULATORY_CARE_PROVIDER_SITE_OTHER): Payer: Self-pay | Admitting: Family Medicine

## 2011-05-02 DIAGNOSIS — E119 Type 2 diabetes mellitus without complications: Secondary | ICD-10-CM

## 2011-05-02 DIAGNOSIS — K429 Umbilical hernia without obstruction or gangrene: Secondary | ICD-10-CM

## 2011-05-02 DIAGNOSIS — E785 Hyperlipidemia, unspecified: Secondary | ICD-10-CM

## 2011-05-12 DIAGNOSIS — K429 Umbilical hernia without obstruction or gangrene: Secondary | ICD-10-CM | POA: Insufficient documentation

## 2011-05-12 MED ORDER — SIMVASTATIN 80 MG PO TABS: 80.0000 mg | ORAL_TABLET | Freq: Every day | ORAL | Status: DC

## 2011-05-12 MED ORDER — METFORMIN HCL 1000 MG PO TABS: 1000.0000 mg | ORAL_TABLET | Freq: Every day | ORAL | Status: DC

## 2011-05-12 NOTE — Progress Notes (Signed)
  Subjective:    Patient ID: Meredith Maynard, female    DOB: September 19, 1966, 45 y.o.   MRN: [id number]  HPI Patient here to talk about hernia and have meds refilled. 1. Hernia:  Patient has had hernia along area where previous hernia has been repaired. Feels like area has gotten bigger and  has been having increased pain along area for the past few weeks.  Feels like pain radiates into her spine at times.  Pain comes and  Goes but when pain is strongest she breaks out in cold sweat.  Her bowel movement have been normal and she has not seen any blood in her stool.  Denies nausea or vomiting.  Requesting surgical referral for repair.    Review of Systems     Objective:   Physical Exam  Constitutional: No distress.       Obese female   Cardiovascular: Regular rhythm and normal heart sounds.   Pulmonary/Chest: Effort normal and breath sounds normal. No respiratory distress.  Abdominal: Soft. Bowel sounds are normal. There is no tenderness. A hernia is present. Hernia confirmed positive in the ventral area.       Hernia Non-TTP          Assessment & Plan:

## 2011-05-12 NOTE — Assessment & Plan Note (Signed)
meds refilled 

## 2011-05-12 NOTE — Assessment & Plan Note (Signed)
A1C indicates good control of diabetes.  WIll refill metformin.

## 2011-05-12 NOTE — Assessment & Plan Note (Signed)
Patient with large umbilical hernia.  CT from 10/2009 with Large anterior abdominal wall hernia containing small bowel loops.  Area non tender so likely not strangulated and having normal BM so likely no obstruction.  Will place surgical referral for further evaluation and management.

## 2011-05-13 ENCOUNTER — Telehealth: Payer: Self-pay | Admitting: *Deleted

## 2011-05-13 NOTE — Telephone Encounter (Signed)
Tried to call pt. Unable to reach. Need to know pt's insurance in order to schedule appt with surgeon. Lorenda Hatchet, Renato Battles

## 2011-05-17 NOTE — Telephone Encounter (Signed)
Faxed referral to project access, patient has orange card.Meredith Maynard, Rodena Medin

## 2011-08-18 LAB — CBC
HCT: 35.3 — ABNORMAL LOW
Platelets: 201
RDW: 17.7 — ABNORMAL HIGH
WBC: 10.5

## 2011-08-18 LAB — COMPREHENSIVE METABOLIC PANEL
AST: 17
Albumin: 3.7
Alkaline Phosphatase: 97
BUN: 11
Chloride: 108
Creatinine, Ser: 0.53
GFR calc Af Amer: >60
Potassium: 3.7
Total Protein: 7.1

## 2011-08-31 ENCOUNTER — Ambulatory Visit (INDEPENDENT_AMBULATORY_CARE_PROVIDER_SITE_OTHER): Payer: Self-pay | Admitting: Family Medicine

## 2011-08-31 ENCOUNTER — Encounter: Payer: Self-pay | Admitting: Family Medicine

## 2011-08-31 VITALS — BP 108/72 | Temp 98.0°F | Ht 65.5 in | Wt 241.0 lb

## 2011-08-31 DIAGNOSIS — I8390 Asymptomatic varicose veins of unspecified lower extremity: Secondary | ICD-10-CM

## 2011-08-31 DIAGNOSIS — Z23 Encounter for immunization: Secondary | ICD-10-CM

## 2011-08-31 DIAGNOSIS — M549 Dorsalgia, unspecified: Secondary | ICD-10-CM

## 2011-08-31 HISTORY — DX: Asymptomatic varicose veins of unspecified lower extremity: I83.90

## 2011-08-31 LAB — POCT URINALYSIS DIPSTICK
Bilirubin, UA: NEGATIVE
Ketones, UA: NEGATIVE
Leukocytes, UA: NEGATIVE
Spec Grav, UA: >=1.03

## 2011-08-31 LAB — POCT UA - MICROSCOPIC ONLY

## 2011-08-31 MED ORDER — CYCLOBENZAPRINE HCL 10 MG PO TABS: 10.0000 mg | ORAL_TABLET | Freq: Three times a day (TID) | ORAL | Status: DC | PRN

## 2011-08-31 NOTE — Patient Instructions (Signed)
Gracias por venir  Toma flexeril en la noche per [employer] para el Dr Ashley Royalty.      Venas varicosas (Varicose Veins) Las venas varicosas son venas que se han agrandado y se tornan sinuosas.   CAUSAS   Este trastorno es el resultado de un malfuncionamiento de las vlvulas de las venas. Las vlvulas de las venas ayudan al retorno de la sangre desde las piernas hacia el corazn. Si estas vlvulas se daan, el flujo sanguneo retorna hacia atrs y [employer] venas de la pierna, cerca de la piel. Esto hace que las venas se agranden debido al aumento de la presin en su interior. Es ms probable que desarrollen venas varicosas las personas que estn de pie durante mucho [place], las mujeres embarazadas o quienes tienen sobrepeso. SNTOMAS    Venas hinchadas, tortuosas, y [place], que se observan ms comnmente en las piernas.     Dolor en las piernas o sensacin de pesadez. Estos sntomas pueden empeorar hacia el final del da.     Hinchazn de las piernas.     Cambios en el color de la piel.  DIAGNSTICO   Su mdico puede diagnosticarle venas varicosas con un examen de las piernas. Podr recomendarle una ecografa de las venas de sus piernas.   TRATAMIENTO   La mayora de las venas varicosas pueden tratarse en casa. Sin embargo, existen otros tratamientos para personas con sntomas persistentes o que desean tratar la apariencia de las venas varicosas. Entre estos tratamientos se incluyen:    Corporate treasurer de venas varicosas muy pequeas.     Medicamentos que se inyectan en la vena. Este medicamento endurece las paredes de la vena y Administrator, sports. Se denomina escleroterapia. Luego, deber [employer] ropa o vendajes que apliquen presin.     Ciruga.  INSTRUCCIONES PARA EL CUIDADO DOMICILIARIO  No permanezca sentado o de pie en una posicin durante largos perodos. No se siente con las piernas cruzadas. Descanse con las piernas Conservator, museum/gallery.     Use  medias elsticas o de soporte. No use prendas apretadas alrededor de las piernas, pelvis o cintura.     Camine todo lo posible para aumentar el flujo de sangre.     Levante los pies de la cama por la noche, alrededor de 5 cm.     Si se ha cortado la piel que est sobre la vena y [place], recustese con la pierna elevada y presione con un pao limpio hasta que la sangre se [place]. Luego coloque una venda sobre el corte. Consulte a su mdico si contina sangrando o necesita una sutura.  SOLICITE ATENCIN MDICA SI:  La piel que rodea el tobillo comienza a lesionarse.     Presenta dolor, enrojecimiento, sensibilidad o hinchazn en la pierna, sobre la vena.     Siente molestias y Radiographer, therapeutic pierna.  Document Released: 07/27/2005 Document Revised: 06/29/2011 [facility] Patient Information 2012 [place], [place].Dolor de espalda en el adulto (Back Pain, Adult) El dolor de cintura es frecuente. Aproximadamente 1 de cada 5 personas lo sufren. La causa rara vez pone en peligro la vida. Con frecuencia mejora luego de algn tiempo. Alrededor de la mitad de las personas que sufren un inicio sbito de dolor de cintura, se sentirn mejor luego de 2 semanas. Aproximadamente 8 de cada 10 se sentirn mejor luego de 6 semanas.   CAUSAS   Algunas causas comunes son:    Distensin de los msculos o ligamentos que sostienen la columna  vertebral.     Desgaste (degeneracin) de los discos vertebrales.     Artritis    Traumatismos directos en la espalda.  DIAGNSTICO   La mayor parte de las veces, la causa directa no se conoce. Sin embargo, Chief Technology Officer puede tratarse efectivamente an cuando no se Best boy. Una de las formas ms precisas de asegurar que la causa del dolor no constituye un peligro es responder a las preguntas del mdico acerca de su salud y sus sntomas. Si el mdico necesita ms informacin, podr indicar anlisis de laboratorio o Education officer, environmental un diagnstico por imgenes (radiografas o  Health visitor). Sin embargo, aunque las [employer] modificaciones, generalmente no es necesaria la [place].   INSTRUCCIONES PARA EL CUIDADO EN EL HOGAR   En algunas personas, el dolor de espalda vuelve. Como rara vez es peligroso, los pacientes pueden aprender a Interior and spatial designer.    Mantngase activo. Si permanece sentado o de pie mucho tiempo en el mismo lugar, se tensiona la espalda.  No se siente, maneje ni se quede parado en un mismo lugar por ms de 30 minutos. Realice caminatas cortas en superficies planas ni bien el dolor haya cedido. Trate de Copy tiempo que camina .     No se quede en la cama. Si hace reposo durante ms de 1 o 2 das, puede Estate agent.     No evite los ejercicios ni el trabajo. El cuerpo est hecho para moverse. No es peligroso estar [place], aunque le duela la espalda. La espalda se curar ms rpido si contina sus actividades antes de que el dolor se vaya.     Preste atencin a su cuerpo cuando se incline y se levante. Muchas personas sienten menos molestias cuando levantan objetos si doblan las rodillas, mantienen la carga cerca del cuerpo y evitan torcerse. Generalmente, las posiciones ms cmodas son las que ejercen menos tensin en la espalda en recuperacin.     Encuentre una posicin cmoda para dormir. Utilice un colchn firme y recustese de [place]. Doble ligeramente sus rodillas. Si se recuesta sobre su espalda, coloque una almohada debajo de sus rodillas.     Tome slo medicamentos de venta libre o recetados, segn las indicaciones del mdico. Los medicamentos de venta libre para Primary school teacher y reducir Futures trader, son los que en general ms ayudan. El mdico podr prescribirle relajantes musculares. Estos medicamentos calman el dolor de modo que pueda retornar a sus actividades normales y a Investment banker, operational.     Aplique hielo sobre la zona lesionada.     Ponga el hielo en una bolsa  plstica.     Colquese una toalla entre la piel y la bolsa de hielo.     Deje la bolsa de hielo durante 15 a 3 a 4 veces por da, durante los primeros 2  3 das. Luego podr alternar Eusebio Me calor y hielo para reducir Chief Technology Officer y los espasmos.     Consulte a su mdico si puede tratar de hacer ejercicios para la espalda y recibir un masaje suave. Pueden ser beneficiosos.     Evite sentirse ansioso o estresado. El estrs aumenta la tensin muscular y puede empeorar el dolor de espalda. Es importante reconocer cuando est ansioso o estresado y aprender la forma de controlarlos. El ejercicio es una gran opcin.  SOLICITE ATENCIN MDICA SI:    Siente un dolor que no se alivia con reposo o medicamentos.     El dolor no  mejora en 1 semana.     Desarrolla nuevos sntomas.     No se siente bien en general.  SOLICITE ATENCIN MDICA DE INMEDIATO SI:  Siente un dolor que se irradia desde la espalda hacia sus piernas.     Desarrolla nuevos problemas en el intestino o la vejiga.     Siente debilidad o adormecimiento inusual en sus brazos o piernas.     Presenta nuseas o vmitos.     Presenta dolor abdominal.     Se siente desfalleciente.  Document Released: 10/17/2005 Document Revised: 06/29/2011 [facility] Patient Information 2012 [place], [place].

## 2011-08-31 NOTE — Progress Notes (Signed)
Low back pain for one week. Located on bilateral lumbar paraspinal areas. No association with walking. Does note that sometimes when she urinates the pain improves. Denies any polyuria or burning when she pees or dysuria. Denies any leg weakness or numbness. Feels well otherwise.  She notes that the veins in the lateral aspect of her right leg appear to be distended and are painful often. This is been going on for quite some time.  PMH reviewed.  ROS as above otherwise neg Medications reviewed.  Exam:  BP 108/72  Temp(Src) 98 F (36.7 C) (Oral)  Ht 5' 5.5" (1.664 m)  Wt 241 lb (109.317 kg)  BMI 39.49 kg/m2 Gen: Well NAD HEENT: EOMI,  MMM Lungs: CTABL Nl WOB Heart: RRR no MRG Abd: NABS, NT, ND Exts: Non edematous BL  LE, warm and well perfused. Feet are 2/4 bilaterally to monofilament.  Mildly tender varicose veins on the right lateral leg no erythema. MSK: No spinal midline tenderness. Bilateral paraspinal lumbar tenderness. Normal gait. Reflexes are 1 and equal bilaterally

## 2011-08-31 NOTE — Assessment & Plan Note (Signed)
Not acute and no indication for urgent referral. Patient will followup with PCP we'll attempt to get her into vascular surgery for this issue.

## 2011-08-31 NOTE — Assessment & Plan Note (Signed)
Musculoskeletal back pain. Discuss this is normal and typically resolves on its own. Provided Flexeril for relief at night. Plan to followup in one week.

## 2011-09-07 ENCOUNTER — Ambulatory Visit (INDEPENDENT_AMBULATORY_CARE_PROVIDER_SITE_OTHER): Payer: Self-pay | Admitting: Family Medicine

## 2011-09-07 ENCOUNTER — Encounter: Payer: Self-pay | Admitting: Family Medicine

## 2011-09-07 VITALS — BP 118/76 | HR 75 | Temp 98.4°F | Ht 65.0 in | Wt 241.0 lb

## 2011-09-07 DIAGNOSIS — M5431 Sciatica, right side: Secondary | ICD-10-CM

## 2011-09-07 DIAGNOSIS — M5430 Sciatica, unspecified side: Secondary | ICD-10-CM

## 2011-09-07 DIAGNOSIS — I8390 Asymptomatic varicose veins of unspecified lower extremity: Secondary | ICD-10-CM

## 2011-09-07 MED ORDER — GABAPENTIN 300 MG PO CAPS: 300.0000 mg | ORAL_CAPSULE | Freq: Every day | ORAL | Status: DC

## 2011-09-07 NOTE — Progress Notes (Signed)
  Subjective:    Patient ID: Meredith Maynard, female    DOB: 03/15/1966, 45 y.o.   MRN: [id number]  HPI 1. R leg pain:  Comes in today with complaint of R Leg pain.  She states that the pain began after a recent fall where her leg "bent backwards."  Pain begins in her lower back and radiates into her buttocks and down the back of her leg. The pain she is currently having is described as "like electric shock down R leg when walking."  She says she also feels like her leg is swollen at times and sometimes she has burning and tingling in her lower leg and feet.  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  She denies any dysfunction with bowel or bladder. 2.  R leg varicosity:  Has had chronic R lower leg varicosity.  This often swells and becomes painful.  She would like to be referred to vascular surgery for treatment of this.  The pain she gets from this is different from the pain she is currently having.    Review of Systems     Objective:   Physical Exam  Constitutional: She appears well-developed and well-nourished. No distress.  Musculoskeletal:       Paraspinal muscle tenderness around L4 on the R.  Unable to tolerate FABER SLR + on the R Patellar and achilles DTR 2+ Strength 5/5 Sensation intact  Large varicosity on Lower R leg.  Non-tender.   No swelling or edema.          Assessment & Plan:

## 2011-09-07 NOTE — Progress Notes (Signed)
Due to language barrier, an interpreter was present during the history-taking and subsequent discussion (and for part of the physical exam) with this patient. Interpreter Wyvonnia Dusky 09/07/2011

## 2011-09-15 ENCOUNTER — Encounter: Payer: Self-pay | Admitting: Family Medicine

## 2011-09-15 ENCOUNTER — Ambulatory Visit (INDEPENDENT_AMBULATORY_CARE_PROVIDER_SITE_OTHER): Payer: Self-pay | Admitting: Family Medicine

## 2011-09-15 DIAGNOSIS — M5430 Sciatica, unspecified side: Secondary | ICD-10-CM

## 2011-09-15 DIAGNOSIS — M5431 Sciatica, right side: Secondary | ICD-10-CM

## 2011-09-15 MED ORDER — MELOXICAM 15 MG PO TABS: 15.0000 mg | ORAL_TABLET | Freq: Every day | ORAL | Status: DC

## 2011-09-15 NOTE — Assessment & Plan Note (Signed)
Large varicosity on R leg, painful and swollen at times.  No current flare.  Will refer to vasc surgery, explained that she would likely have a wait since she has orange card and this is non-emergent.

## 2011-09-15 NOTE — Assessment & Plan Note (Addendum)
Symptoms consistent with R side sciatic nerve irritation.  Will avoid systemic steroids for now given history of diabetes.  Discussed possible options including NSAID vs. Gabapentin.  She elects to to try gabapentin starting at low dose at night.

## 2011-09-15 NOTE — Patient Instructions (Signed)
Make appointment with Dr. Katrinka Blazing for manipulation for sciatica/low back pain  Citica con rehabilitacin (Sciatica with Rehab) El nervio citico va desde la regin inferior de la espalda hacia la pierna y es el responsable de la sensibilidad y el control de los msculos de la parte de atrs (posterior) del muslo, pierna y pie. La citica es una enfermedad caracterizada por una inflamacin en este nervio.   SNTOMAS  Signos de dao al nervio, incluso adormecimiento o debilidad en el lado posterior de las extremidades bajas.     Dolor en la parte posterior del muslo que podra bajar hacia la pierna.     Dolor que [employer] al estar sentado durante largos perodos de [place].     Algunas veces, sensibilidad en las nalgas.  CAUSAS La causa de la citica es la inflamacin de los nervios citicos. La inflamacin se debe a que algo irrita el nervio. Entre las causas de la irritacin se encuentran:  Estar sentado durante largos perodos.     Traumatismos directos al nervio.     Artritis de Landscape architect.     Hernia o ruptura de disco.     Deslizamiento de Blenda Bridegroom (espondilolistesis).     Presin de los tejidos blandos, como msculos o el tejidos tipo ligamento (fascia).  LOS RIESGOS AUMENTAN CON:  Deportes en los que se presiona la columna (ftbol americano o levantamiento de pesas).     Poca fuerza y flexibilidad.     No hacer un precalentamiento adecuado.     Historia familiar de dolor de cintura o trastornos en discos.     Lesiones o cirugas previas en la espalda.     Mecnica incorrecta del cuerpo, en especial al levantar, o mala postura.  PREVENCIN  Precalentamiento adecuado y elongacin antes de la [place].     Mantener la forma fsica:     Earma [place], flexibilidad y resistencia muscular.     Capacidad cardiovascular.     Conozca y use tcnicas adecuadas, especialmente en posturas y levantamiento. Cuando sea posible, tener un entrenador que corrija la Investment banker, operational.     Evite las actividades que tensionen constantemente la columna.  PRONSTICO Si se trata adecuadamente, generalmente es curable dentro de las 6 semanas. En ocasiones requiere someterse a [place].   COMPLICACIONES RELACIONADAS  Dao permanente que incluye dolor, adormecimiento, hormigueo o debilidad.     Dolor crnico en la espalda.     Riesgos de la ciruga: infecciones, hemorragias, dao en los nervios, o daos a los tejidos circundantes.  TRATAMIENTO El tratamiento inicial incluye interrumpir las actividades que agravan los sntomas. Se incluye el uso de medicamentos y la aplicacin de hielo para reducir Chief Technology Officer y la inflamacin. Los ejercicios de elongacin y fortalecimiento pueden ayudar a reducir Chief Technology Officer con la [place]. Los ejercicios pueden Management consultant o con un terapeuta. Un terapeuta podr recomendarle otros tratamientos, como estimulacin nerviosa electrnica transcutnea (TENS) o ultrasonido. En algunos casos se indica una inyeccin de corticoides para reducir la inflamacin del nervio citico. Si los sntomas persisten por ms de 6 meses de tratamiento no quirrgico (conservador), se Public relations account executive. MEDICAMENTOS    Si necesita analgsicos, se recomiendan los antiinflamatorios no esteroides, como aspirina e ibuprofeno y otros calmantes menores, como acetaminofeno.     No tome medicamentos para el dolor dentro de los [address] previos a la [place].     Los analgsicos prescriptos se indicarn si el mdico lo considera necesario. Utilcelos como se le indique  y slo cuando lo necesite.     Los ungentos pueden ser beneficiosos.     En algunos casos se indica una inyeccin de corticosteroides. Estas inyecciones deben reservarse para los [place] graves, porque slo se pueden administrar una determinada cantidad de veces.  CALOR Y FRO    El tratamiento con fro [employer] y reduce la inflamacin. El fro debe aplicarse durante 10 a 15 minutos cada  2  3 horas para reducir la inflamacin y Chief Technology Officer e inmediatamente despus de cualquier actividad que agrava los sntomas. Utilice bolsas de hielo o masajee la zona con un trozo de hielo (masaje de hielo).     El calor puede usarse antes de Therapist, music y de las actividades de fortalecimiento indicadas por el profesional, le fisioterapeuta o Orthoptist. Utilice una bolsa trmica o sumerja la lesin en agua caliente.  SOLICITE ATENCIN MDICA SI:  El tratamiento parece no ofrecer beneficios, o el trastorno [employer] Corporation.     Los medicamentos producen efectos secundarios.  EJERCICIOS EJERCICIOS DE AMPLITUD DE MOVIMIENTOS Y ELONGACIN Citica La mayora de las personas con dolor de citica encuentran que sus sntomas empeoran con el delantero excesiva flexin (flexin) o arco en la espalda baja (extensin). Los ejercicios que le ayudarn a Oncologist sus sntomas se Research scientist (life sciences). El mdico, fisioterapeuta o Furniture conservator/restorer a Chief Strategy Officer qu ejercicios sern de ayuda para resolver su dolor de espalda. No realice ningn ejercicio sin consultarlo antes con el profesional. Discontinue los ejercicios que empeoran sus sntomas, hasta que hable con el mdico. Si siente dolor, entumecimiento u hormigueo que [employer] glteos, piernas o pies, el objetivo de esta terapia es que estos sntomas se acerquen a la espalda y Customer service manager. En ocasiones, los sntomas de la pierna mejorarn, Development worker, international aid en la espalda puede empeorar; esto es un indicio tpico del progreso en la rehabilitacin. Asegrese de que estar atento a cualquier cambio en sus sntomas y las actividades que ha [employer]	[place] 24 horas antes del cambio. Compartir esta informacin con su mdico le permitir un mejor tratamiento para tratar su enfermedad. Estos ejercicios le ayudarn en la recuperacin de la lesin. Los sntomas podrn aliviarse con o sin asistencia adicional de su mdico, fisioterapeuta o Herbalist.  Al completar estos ejercicios, recuerde:    Restaurar la flexibilidad del tejido ayuda a que las articulaciones recuperen el movimiento normal. Esto permite que el movimiento y la actividad sea ms saludables y menos dolorosos.     Para que sea efectiva, cada elongacin debe realizarse durante al menos 30 segundos.     La elongacin nunca debe ser dolorosa. Deber sentir slo un alargamiento o distensin suave del tejido que estira.  EJERCICIOS DE AMPLITUD DE MOVIMIENTOS Y ELONGACIN: ELONGACION Flexin - una rodilla al pecho  Recustese en una cama dura o sobre el piso, con ambas piernas extendidas al frente.     Manteniendo una pierna en contacto con el piso, lleve la rodilla opuesta al pecho. Mantenga la pierna en esa posicin, sostenindola por la zona posterior del muslo o por la rodilla.     Presione hasta sentir un suave estiramiento en la cintura. Mantenga esta posicin durante __________ segundos.     Libere la pierna lentamente y repita el ejercicio con el lado opuesto.  Reptalo __________ veces. Realice este estiramiento __________ Anthoney Harada por da.   ELONGACIN Flexin, dos rodillas al pecho   Recustese en una cama dura o sobre el piso, con ambas piernas extendidas  al frente.     Manteniendo una pierna en contacto con el piso, lleve la rodilla opuesta al pecho.     Tense los msculos del estmago para apoyar la espalda y levante la otra rodilla [place]. Mantenga las piernas en su lugar y tmese por detrs de las caderas o las rodillas.     Con ambas rodillas en el pecho, tire hasta que sienta un estiramiento en la parte trasera de la espalda. Mantenga esta posicin durante __________ segundos.     Tense los msculos del estmago y baje las piernas de a una por vez.  Reptalo __________ veces. Realice este estiramiento __________ Anthoney Harada por da.   ELONGACIN Rotacin de la zona baja del tronco.  Recustese sobre una cama firme o sobre el suelo. Mantenga las piernas al  frente, doble las rodillas de modo que ambas apunten hacia el techo y los pies queden bien apoyados en el piso.     Extienda los brazos a Dance movement psychotherapist. Esto estabilizar la zona superior del cuerpo, manteniendo los hombros en contacto con el piso.     Con cuidado y lentamente deje caer ambas rodillas juntas hacia un lado, hasta que sienta un suave estiramiento en la espalda baja. Mantenga esta posicin durante __________ segundos.     Tense los msculos del estmago para apoyar la espalda y lleve las rodillas a la posicin inicial. Repita el ejercicio hacia el otro lado.  Reptalo __________ veces. Realice este ejercicio __________ veces por da. EJERCICIOS DE AMPLITUD DE MOVIMIENTOS Y FLEXIBILIDAD: ELONGACIN Extensin posicin prona sobre los codos  Acustese sobre el estmago sobre el piso, una cama ser muy blanda. Coloque las palmas a una distancia igual al ancho de los hombres y a la altura de la cabeza.     Coloque los codos bajo los hombros. Si siente dolor, colquese almohadas debajo del pecho.     Deje que su cuerpo se relaje, de modo que las caderas queden ms abajo y tengan ms contacto con el piso.     Mantenga esta posicin durante __________ segundos.     Vuelva lentamente a la posicin plana sobre el piso.  Reptalo __________ veces. Realice este estiramiento __________ Anthoney Harada por da.   AMPLITUD DE MOVIMIENTOS - Extensin - flexin de brazos en posicin prona  Acustese sobre el [employer]	[place] piso, una cama ser [place]. Coloque las palmas a una distancia igual al ancho de los hombres y a la altura de la cabeza.     Mantenga la espalda tan relajada como pueda, enderece lentamente los codos [address] las caderas contra el suelo. Puede modificar la posicin de las manos para estar ms cmodo. A medida que gana movimiento, sus manos quedarn ms por debajo de los hombros.     Mantenga cada posicin durante __________ segundos.     Vuelva lentamente a la posicin  plana sobre el piso.  Reptalo __________ veces. Realice este estiramiento __________ Anthoney Harada por da.   EJERCICIOS DE FORTALECIMIENTO - Citica Estos ejercicios le ayudarn en la recuperacin de la lesin. Estos ejercicios deben hacerse cerca de su "punto dulce". Este es el arco neutro, de la parte baja de la espalda, en algn lugar entre la posicin completamente redondeada y arqueada plenamente, que es la posicin menos dolorosa. Cuando se realiza en [employer] de seguridad del movimiento, estos ejercicios se pueden Chemical engineer para las personas que tienen una lesin basada en flexin o extensin. Con estos ejercicios, los sntomas podrn desaparecer con o sin  mayor intervencin del profesional, el fisioterapeuta o Orthoptist. Al completar estos ejercicios, recuerde:    Los msculos pueden ganar tanto la resistencia como la fuerza que necesita para sus actividades diarias a travs de ejercicios controlados.     Realice los ejercicios como se lo indic el mdico, el fisioterapeuta o Orthoptist. [employer] slo con los ejercicios de resistencia y haga las repeticiones que su mdico le indique.     Podr experimentar dolor o cansancio muscular, pero el dolor o molestia que trata de eliminar a travs de los ejercicios nunca debe empeorar. Si el dolor empeora, detngase y asegrese de que est siguiendo las directivas correctamente. Si an siente dolor luego de Education officer, environmental lo ajustes necesarios, deber discontinuar el ejercicio hasta que pueda conversar con el profesional sobre el problema.  FORTALECIMIENTO - Abdominales profundos - Inclinacin plvica  Recustese sobre una cama firme o sobre el suelo. Mantenga las piernas al frente, doble las rodillas de modo que ambas apunten hacia el techo y los pies queden bien apoyados en el piso.     Tensione la zona baja de los msculos abdominales para presionar la Oncologist. Este movimiento har rotar su pelvis de modo que el cccix quede hacia arriba y  no apuntando a los pies o hacia el piso.     Con una tensin suave y respiracin pareja, mantenga esta posicin durante __________ segundos.  Reptalo __________ veces. Realice este estiramiento __________ Anthoney Harada por da.   FORTALECIMIENTO - Abdominales encogimiento abdominal  Recustese sobre una cama firme o sobre el suelo. Mantenga las piernas al frente, doble las rodillas de modo que ambas apunten hacia el techo y los pies queden bien apoyados en el piso. Cruce las Google.     Apunte suavemente con la barbilla hacia abajo, sin doblar el cuello.     Tensione los abdominales y eleve lentamente el tronco la altura suficiente para despegar los omplatos. Si se eleva ms, pondr tensin excesiva en la cintura y esto no fortalecer ms los abdominales.     Controle la vuelta a la posicin inicial.  Reptalo __________ veces. Realice este estiramiento __________ Anthoney Harada por da.   FORTALECIMIENTO - En cuadrpedo, elevacin de miembro superior e inferior opuestos  [employer] y las rodillas en una superficie firme. Las manos deben quedar a la altura de los hombros y las rodillas debajo de las caderas. Puede colocar algo debajo las rodillas para estar ms cmodo.     Encuentre la posicin neutral de la columna vertebral y Public house manager los msculos abdominales de modo que pueda mantener esta posicin. Los hombros y las caderas deben formar un rectngulo paralelo con el suelo y recto.     Manteniendo el tronco firme, eleve la mano derecha a la altura del hombro y luego eleve la pierna izquierda a la altura de la cadera. Asegrese de no contener la respiracin. Mantenga cada posicin durante __________ segundos.     Con los msculos abdominales en tensin y la espalda firme, vuelva lentamente a la posicin inicial. Repita con el otro brazo y la otra pierna.  Reptalo __________ veces. Realice este estiramiento __________ Anthoney Harada por da.   FUERZA Abdominales y cudriceps -  Lexicographer las piernas rectas  Recustese en una cama dura o sobre el piso, con ambas piernas extendidas al frente.     Deje una pierna en contacto con el suelo y doble la otra rodilla de manera que el pie quede contra el suelo.  Encuentre la posicin neutral de la columna vertebral y Public house manager los msculos abdominales de modo que pueda mantener esta posicin.     Levante lentamente la pierna del suelo una 6 pulgadas y cuente [place] 15, asegrese de no contener la respiracin.     Mantega la columna en posicin neutral, y baje lentamente la pierna hasta el suelo.  Repita el ejercicio con cada pierna __________ veces. Realice este estiramiento __________ Anthoney Harada por da. CONSIDERACIONES ACERCA DE LA POSTURA Y LA MECNICA DEL CUERPO Citica Si mantiene una postura correcta cuando se encuentre de pie, sentado o realizando sus actividades, reducir el [employer] tejidos del cuerpo, y Acupuncturist a los tejidos lesionados la posibilidad de curarse y Film/video editor las experiencias dolorosas. A continuacin se indican pautas generales para mejorar la postura- Su mdico o fisioterapeuta le dar instrucciones especficas segn sus necesidades. Al leer estas pautas recuerde:  Los ejercicios indicados por su mdico lo ayudarn a Chartered certified accountant flexibilidad y la fuerza para Pharmacologist las posturas correctas.     Una postura correcta le proporciona a sus articulaciones el medio ptimo para funcionar bien. Las articulaciones se desgastan menos cuando estn sostenidas adecuadamente por una columna vertebral en buena postura. Esto significa que su cuerpo estar ms sano y Research officer, trade union.     La correcta postura debe practicarse en todas las actividades, especialmente al estar sentado o de pie durante [place]. Tambin es importante al realizar actividades repetitivas de bajo estrs (tipeo) o una actividad nica y pesada.  POSICIONES DE Dellie Catholic Tenga en cuenta cules son las posturas que ms dolor le  causan al elegir una posicin de descanso. Si siente dolor con las actividades en que deba realizar una flexin (sentarse, inclinarse, detenerse, ponerse en cuclillas), elija una posicin que le permita descansar en una postura menos flexionada. Evite curvarse en posicin fetal cuando se encuentre de lado. Si el dolor empeora con las actividades basadas en la extensin (estar de pie durante un tiempo prolongado, trabajar con las manos por arriba de la cabeza) evite descansar en Neomia Dear posicin extendida durante mucho tiempo, como dormir sobre el [place]. La [employer] de las Psychologist, forensic cmodo el descanso sobre la columna vertebral en una posicin neutral, ni muy redondeada ni [place]. Recustese sobre su lado en una cama que no est hundida con una almohada entre las rodillas o sobre la espalda con una almohada bajo las rodillas, y sentir [place]. Tenga en cuenta que cualquier posicin en Google, no importa si es una postura [employer], puede provocarle rigidez. POSTURAS CORRECTAS PARA SENTARSE Con el fin de minimizar el estrs y Environmental health practitioner en su columna, deber sentarse con la postura correcta. Esto le ayudar a que el cuerpo est ms sano. Recuperar una buena postura es un proceso gradual. Muchas personas pueden trabajar ms cmodas mediante el uso de diferentes soportes hasta que tengan la flexibilidad y la fuerza para mantener esta postura por su cuenta. Al sentarse con la [employer], los odos deben estar sobre los hombros y los hombros sobre las caderas.  Debe utilizar el respaldo de la silla para apoyar la espalda. La espalda estar en una posicin neutral, ligeramente arqueada. Puede colocar una pequea almohada o toalla doblada en la base de la espalda baja para apoyarse.   Si trabaja en un escritorio, cree un ambiente que le proporciones un buen soporte y [place]. Sin soporte extra, los msculos se fatigan y causan tensin excesiva en las articulaciones y otros  tejidos. Janie Morning  en cuenta estas recomendaciones: SILLA:   La silla debe poder deslizarse por debajo del escritorio cuando su espalda tome contacto con el respaldo. Esto le permitir trabajar ms cerca.     La altura de la silla debe permitirle que los ojos tengan el nivel de la parte superior del monitor y las manos estn ms abajo que los codos.  POSICIN DEL CUERPO  Los pies deben tener contacto con el piso. Si no es posible, use un posapies.     Mantenga las [employer] hombros. Esto reducir el estrs en el cuello y en la cintura.  POSTURAS INCORRECTAS PARA SENTARSE  Si se siente cansado e incapaz de asumir una postura sentada sana, no se encorve ni se hunda. Esto pone una tensin excesiva en los tejidos de su espalda, y causa ms dao y Engineer, mining. Entre las opciones ms saludables se incluyen:     El uso de ms apoyo, como una almohada lumbar.     Cambio de tareas, a algo que demande una posicin vertical o caminar.     Tomar una breve caminata.     Recostarse y Theatre stage manager posicin neutral.  DE PIE DURANTE UN TIEMPO PROLONGADO E INCLINADO LIGERAMENTE HACIA ADELANTE Cuando deba realizar una tarea que requiera inclinacin hacia adelante estando de pie en el mismo sitio durante mucho tiempo, coloque un pie en un objeto de 2 a 4 pulgadas de alto, para [employer]. Cuando ambos pies estn en el piso, la zona inferior de la espalda tiene a perder su ligera curvatura hacia adentro. Si esta curva se aplana (o se pronuncia demasiado) la espalda y las articulaciones experimentarn demasiado estrs, se fatigarn ms rpidamente y Geophysicist/field seismologist.   POSTURAS CORRECTAS PARA ESTAR DE PIE Una postura adecuada de pie realizarse en todas las actividades diarias, incluso si slo toman un momento, como al [employer]. Como en la postura de sentado, los odos deben estar sobre los hombros y los hombros sobre las caderas.  Deber mantener una ligera tensin en sus msculos  abdominales para asegurar la columna vertebral. El cccix debe apuntar hacia el suelo, no detrs de su cuerpo, por que resulta en una curvatura de la espalda sobre-extendida.   POSTURAS INCORRECTAS PARA ESTAR DE PIE Las posturas incorrectas para estar de pie incluyen tener la cabeza hacia delante, las rodillas bloqueadas o una excesiva curvatura de la espalda. CAMINAR Camine en Deborha Payment erguida. [place], hombros y caderas deben estar alineados. ACTIVIDAD PROLONGADA EN UNA POSICIN FLEXIONADA Al completar una tarea que requiere que se doble la cintura hacia adelante o inclinarse sobre una superficie baja, trate de encontrar una manera de estabilizar 3 de cada 4 de sus miembros. Puede colocar una mano o el codo en el [place], o descansar una rodilla en la superficie en la que est apoyado. Esto le proporcionar ms estabilidad para que sus msculos no se cansen tan rpidamente.  El [employer] rodillas [place], o ligeramente dobladas, tambin reducir el estrs en la espalda baja. TCNICAS CORRECTAS PARA LEVANTAR OBJETOS SI:   Asumir una postura amplia. Esto le proporcionar ms estabilidad y la oportunidad de acercarse lo ms posible al objeto que se est levantando.     Tense los abdominales para asegurar la columna vertebral; luego flexione rodillas y caderas. Manteniendo la espalda en una posicin neutral, haga el esfuerzo con los msculos de la pierna.  Levntese con las piernas, manteniendo la espalda derecha.     Pruebe el [employer]  objetos desconocidos antes de tratar de levantarlos.     Trate de [employer] codos hacia abajo y a los lados, con el fin de obtener la fuerza de los hombros al llevar un objeto.     Siempre pida ayuda a otra persona cuando deba levantar objetos pesados o incmodos  TCNICAS INCORRECTAS PARA LEVANTAR OBJETOS NO:   Bloquee rodillas al levantar, aunque sea un objeto pequeo.     Se doble ni gire. Gire sobre los pies ni los mueva cuando necesite  cambiar de direccin.     Tome conciencia que no puede levantar con seguridad ni un clip de papel, sin Clinical biochemist.  Document Released: 10/05/2009 Document Revised: 06/29/2011 [facility] Patient Information 2012 [place], [place].

## 2011-09-15 NOTE — Progress Notes (Signed)
  Subjective:    Patient ID: Meredith Maynard, female    DOB: June 12, 1966, 45 y.o.   MRN: [id number]  HPI 45 year old here for a same-day evaluation of 3 weeks of low back and right leg pain. Was seen approximately 2 weeks ago and was prescribed Flexeril which has helped some. Saw her PCP one week ago and diagnosed with sciatica. She was never able to fill a her prescription for Arthrotec as a cost $44 a month and she does not recall ever being prescribed gabapentin.  Initial episode was brought on by a fall. She notes little improvement over the past 3 weeks. Continues to have mild low back pain but moderate to severe radicular pain down the back of her leg to her feet. 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   Review of Systems No numbness, tingling, weakness, change in bowel or bladder movements    Objective:   Physical Exam GEN: Alert & Oriented, No acute distress CV:  Regular Rate & Rhythm, no murmur Respiratory:  Normal work of breathing, CTAB Abd:  + BS, soft, no tenderness to palpation Ext: no pre-tibial edema Back Exam: mild tenderness to palpation on right lumbar paraspinal muscle and gluteal muscles.  Positive right straight leg raise.  Patellar reflexes 2+ bilaterally, normal strength on ambulation.        Assessment & Plan:

## 2011-09-15 NOTE — Assessment & Plan Note (Addendum)
Acute on chronic sciatica.  Advised continued use of flexeril and added meloxicam.  She is interested in manipulation therapy to help with MSK pain.  Gave her info on sciatica in spanish including exercises.  Will refer to Dr. Michaelle Copas clinic

## 2011-09-16 ENCOUNTER — Telehealth: Payer: Self-pay | Admitting: *Deleted

## 2011-09-16 NOTE — Telephone Encounter (Signed)
Left message for patient to return call. Please ask for her insurance information, the last card scanned in is a Jaynee Eagles card that expired in 2008. I need this information for a referral, Thanks.Mostafa Yuan, Rodena Medin

## 2011-09-20 ENCOUNTER — Telehealth (HOSPITAL_COMMUNITY): Payer: Self-pay | Admitting: Family Medicine

## 2011-09-20 NOTE — Telephone Encounter (Signed)
I called pt and pt has [facility] card. Pt will come and  bring the OC for as to be able to scan and have copy of it.  [place]ines

## 2011-09-27 ENCOUNTER — Encounter: Payer: Self-pay | Admitting: Family Medicine

## 2011-09-27 ENCOUNTER — Ambulatory Visit (INDEPENDENT_AMBULATORY_CARE_PROVIDER_SITE_OTHER): Payer: Self-pay | Admitting: Family Medicine

## 2011-09-27 VITALS — BP 129/81 | HR 94 | Temp 98.0°F | Ht 65.0 in | Wt 245.2 lb

## 2011-09-27 DIAGNOSIS — M2170 Unequal limb length (acquired), unspecified site: Secondary | ICD-10-CM

## 2011-09-27 DIAGNOSIS — M2140 Flat foot [pes planus] (acquired), unspecified foot: Secondary | ICD-10-CM | POA: Insufficient documentation

## 2011-09-27 DIAGNOSIS — M549 Dorsalgia, unspecified: Secondary | ICD-10-CM

## 2011-09-27 DIAGNOSIS — M999 Biomechanical lesion, unspecified: Secondary | ICD-10-CM | POA: Insufficient documentation

## 2011-09-27 HISTORY — DX: Flat foot (pes planus) (acquired), unspecified foot: M21.40

## 2011-09-27 HISTORY — DX: Unequal limb length (acquired), unspecified site: M21.70

## 2011-09-27 NOTE — Assessment & Plan Note (Signed)
After verbal consent pt did have HVLA, with marked improvement.  Gave side effects to look out for and can take anti inflammatories in the acute time frame.   

## 2011-09-27 NOTE — Patient Instructions (Addendum)
Very nice to meet you. I am giving you a heel lift to put in your shoe wear this daily I am giving you some arch supports to wear on her feet to help your feet pain. When you are sitting I when she to put a tennis ball in your back right pocket this will help massage your muscle. I when she to continue to do the exercises daily Come back and see me in 3-4 weeks or Dr. Wylie Hail de conocerte. Te estoy dando una elevacin del taln para poner en su zapato llevar este diario Le estoy dando algunos soportes para el arco para usar en sus pies para aliviar su dolor pies. Cuando yo sentado cuando ella poner una pelota de tenis en el bolsillo derecho de nuevo que esto ayudar a Engineer, maintenance (IT) el msculo. Yo cuando a continuar haciendo los [employer] a verme en 3-4 semanas o Dr. Ashley Royalty

## 2011-09-27 NOTE — Assessment & Plan Note (Signed)
Likely the underlying cause of patient's piriformis syndrome. Patient is to continue the exercises and stretches was placed with a heel lift as well as arch support bandages. Patient's after a long discussion understood this in Spanish given instructions will followup in 3-4 weeks to make sure she is doing better.

## 2011-09-27 NOTE — Assessment & Plan Note (Signed)
Arch bandages given today given some exercises to do to help her arch support. Followup in 3-4 weeks

## 2011-09-27 NOTE — Progress Notes (Signed)
  Subjective:    Patient ID: Meredith Maynard, female    DOB: December 17, 1965, 45 y.o.   MRN: [id number]  HPI 45 year old female coming in with followup on sciatic pain. Patient has been seen by her primary care physician Dr. Ashley Royalty as well as Dr. Earnest Bailey recently. Decided that she should come here for the potential for manipulation. Patient states that after a long day of walking that she seems to have a lot of pain right-sided usually in the lower back buttocks region but does radiate down the back of her leg. Patient denies any weakness or numbness denies any swelling of his leg. Patient states that usually her feet start to hurt bilaterally first and then this pain seems to come on. Patient does a lot of work where she has to stretch and pick things up as well as be on her feet for most of the day working 14 hours some times a day and this seems to exacerbate the problem. Patient has been doing some exercises that she was given by Dr. Earnest Bailey and has seen some improvement. Patient though does not feel like she is at her baseline yet. Patient denies any type of fevers or chills and denies any type of numbness or weakness in that leg.   Review of Systems As stated in the history of present illness   past medical surgical family and social history reviewed with no changes Objective:   Physical Exam General: No apparent distress obese Hispanic female Hip: ROM IR: 80 Deg, ER: 80 Deg, Flexion: 120 Deg, Extension: 100 Deg, Abduction: 45 Deg, Adduction: 45 Deg Strength IR: 5/5, ER: 5/5, Flexion: 5/5, Extension: 5/5, Abduction: 5/5, Adduction: 5/5 Pelvic alignment unremarkable to inspection and palpation. Standing hip rotation and gait without trendelenburg / unsteadiness. Greater trochanter without tenderness to palpation.  tenderness over piriformis and none over greater trochanter. No SI joint tenderness and normal minimal SI movement. Patient does have a significant half inch leg leg discrepancy  right-sided shorter. Pes planus bilaterally  OMT Findings: Cervical: C3 flexed rotated and side bent left Thoracic T5 extended rotated and side bent right Lumbar: L2 rotated and side bent right Sacrum: Right on right      Assessment & Plan:

## 2011-09-28 ENCOUNTER — Ambulatory Visit (INDEPENDENT_AMBULATORY_CARE_PROVIDER_SITE_OTHER): Payer: Self-pay | Admitting: Family Medicine

## 2011-09-28 ENCOUNTER — Encounter: Payer: Self-pay | Admitting: Family Medicine

## 2011-09-28 VITALS — BP 107/74 | HR 83 | Ht 64.5 in | Wt 246.0 lb

## 2011-09-28 DIAGNOSIS — N926 Irregular menstruation, unspecified: Secondary | ICD-10-CM

## 2011-09-28 DIAGNOSIS — E785 Hyperlipidemia, unspecified: Secondary | ICD-10-CM

## 2011-09-28 DIAGNOSIS — E119 Type 2 diabetes mellitus without complications: Secondary | ICD-10-CM

## 2011-09-28 HISTORY — DX: Irregular menstruation, unspecified: N92.6

## 2011-09-28 LAB — CBC
MCV: 75.3 fL — ABNORMAL LOW (ref 78.0–100.0)
Platelets: 241 10*3/uL (ref 150–400)
RBC: 4.38 MIL/uL (ref 3.87–5.11)
RDW: 16.1 % — ABNORMAL HIGH (ref 11.5–15.5)
WBC: 8.5 10*3/uL (ref 4.0–10.5)

## 2011-09-28 LAB — COMPREHENSIVE METABOLIC PANEL
Albumin: 3.8 g/dL (ref 3.5–5.2)
Alkaline Phosphatase: 116 U/L (ref 39–117)
BUN: 15 mg/dL (ref 6–23)
CO2: 30 mEq/L (ref 19–32)
Glucose, Bld: 160 mg/dL — ABNORMAL HIGH (ref 70–99)
Potassium: 3.9 mEq/L (ref 3.5–5.3)

## 2011-09-28 LAB — LDL CHOLESTEROL, DIRECT: Direct LDL: 124 mg/dL — ABNORMAL HIGH

## 2011-09-28 LAB — TSH: TSH: 0.995 u[IU]/mL (ref 0.350–4.500)

## 2011-09-28 NOTE — Progress Notes (Signed)
Due to language barrier, an interpreter was present during the history-taking and subsequent discussion (and for part of the physical exam) with this patient. Interpreter Wyvonnia Dusky for Dr Ashley Royalty. 14.00 09/28/2011

## 2011-09-29 NOTE — Telephone Encounter (Signed)
Faxed referral to project access.Meredith Maynard, Meredith Maynard

## 2011-10-04 NOTE — Progress Notes (Deleted)
  Subjective:    Patient ID: Meredith Maynard, female    DOB: May 22, 1966, 45 y.o.   MRN: [id number]  HPI       Review of Systems     Objective:   Physical Exam        Assessment & Plan:

## 2011-10-10 MED ORDER — BLOOD GLUCOSE METER KIT: PACK | Status: DC

## 2011-10-10 NOTE — Progress Notes (Signed)
SUBJECTIVE:  45 y.o. female for annual routine check up Current Outpatient Prescriptions  Medication Sig Dispense Refill  . aspirin (BAYER CHILDRENS ASPIRIN) 81 MG chewable tablet Chew 81 mg by mouth daily.        . cyclobenzaprine (FLEXERIL) 10 MG tablet Take 1 tablet (10 mg total) by mouth every 8 (eight) hours as needed for muscle spasms.  30 tablet  1  . ferrous sulfate (FERROUSUL) 325 (65 FE) MG tablet Take 1 tablet (325 mg total) by mouth 2 (two) times daily. Label in spanish  31 tablet  6  . lisinopril (PRINIVIL,ZESTRIL) 20 MG tablet Take 1 tablet (20 mg total) by mouth daily. Label in spanish  90 tablet  3  . meloxicam (MOBIC) 15 MG tablet Take 1 tablet (15 mg total) by mouth daily.  30 tablet  0  . metFORMIN (GLUCOPHAGE) 1000 MG tablet Take 1 tablet (1,000 mg total) by mouth daily. daily  30 tablet  6  . simvastatin (ZOCOR) 80 MG tablet Take 1 tablet (80 mg total) by mouth at bedtime.  31 tablet  3   Allergies: Latex  Patient is perimenopausal.  She states that her menstrual periods are starting to become more sporadic and she is statrign to have hot flashes occasionally. History or BTl, not pregnant.   She is compliant with her above meds.  She is currently walking for exercise approx 30-57min  ROS:  Feeling well. No dyspnea or chest pain on exertion.  No abdominal pain, change in bowel habits, black or bloody stools.  No urinary tract symptoms. GYN ROS: no breast pain or new or enlarging lumps on self exam, no vaginal bleeding, no discharge or pelvic pain. No neurological complaints.  OBJECTIVE:  The patient appears obese, alert, oriented x 3, in no distress. BP 107/74  Pulse 83  Ht 5' 4.5" (1.638 m)  Wt 246 lb (111.585 kg)  BMI 41.57 kg/m2 ENT normal.  Neck supple. No adenopathy or thyromegaly. PERLA.  Lungs are clear, good air entry, no wheezes, rhonchi or rales. Heart with S1 and S2 normal, no murmurs, regular rate and rhythm.  Abdomen soft without tenderness, guarding, or  organomegaly. She does have large incisional hernia, non-tender. Extremities show no edema, normal peripheral pulses.  Neurological is normal, no focal findings.    ASSESSMENT:  well woman  PLAN:   counseled on breast self exam, mammography screening, menopause and adequate intake of calcium and vitamin D, regular exercise and weight loss Has had 3 prior paps which were normal.  Next pap not due for 3 years.   Check TSH to be sure not contributing to irregular menstrual cycles Check CMET, CBC, D-LDL, A1c

## 2011-10-17 ENCOUNTER — Encounter: Payer: Self-pay | Admitting: Family Medicine

## 2011-10-27 ENCOUNTER — Ambulatory Visit (INDEPENDENT_AMBULATORY_CARE_PROVIDER_SITE_OTHER): Payer: Self-pay | Admitting: Family Medicine

## 2011-10-27 ENCOUNTER — Encounter: Payer: Self-pay | Admitting: Family Medicine

## 2011-10-27 DIAGNOSIS — M5431 Sciatica, right side: Secondary | ICD-10-CM

## 2011-10-27 DIAGNOSIS — M2170 Unequal limb length (acquired), unspecified site: Secondary | ICD-10-CM

## 2011-10-27 DIAGNOSIS — M5430 Sciatica, unspecified side: Secondary | ICD-10-CM

## 2011-10-27 DIAGNOSIS — M2140 Flat foot [pes planus] (acquired), unspecified foot: Secondary | ICD-10-CM

## 2011-10-27 MED ORDER — CALCIUM CARB-CHOLECALCIFEROL 1000-800 MG-UNIT PO TABS: 1.0000 | ORAL_TABLET | Freq: Every day | ORAL | Status: DC

## 2011-10-27 NOTE — Assessment & Plan Note (Signed)
Patient has had complete relief of her sciatica pain secondary to this leg length discrepancy. Continue to wear the arch supports as well as the heel lift patient given website of where to find more lifts if needed in the future. Patient will followup as needed.

## 2011-10-27 NOTE — Assessment & Plan Note (Signed)
Continue to wear arch supports told patient to the sandals did not give any support and that is why she's having some pain in the foot as well as anterior right knee. Otherwise will followup as needed.

## 2011-10-27 NOTE — Progress Notes (Signed)
  Subjective:    Patient ID: Meredith Maynard, female    DOB: 1966-10-27, 45 y.o.   MRN: [id number]  HPI Patient is here for followup of sciatica pain. Patient at last visit was found to have a leg length discrepancy and some pes planus bilaterally. Patient was given a heel lift in exercises to do during this time. Patient is here for followup now. Patient states that she is having no pain at all. Patient states that she wears her tennis shoes most days of the week. Patient though does state that she has some anterior right knee pain when she wears sandals only. Patient though has stopped wearing the sandals as much. Patient is very happy with her regimen and is now to make any changes.   Review of Systems Past medical history, social, surgical and family history all reviewed.  Denies fever, chills, nausea vomiting abdominal pain, dysuria, chest pain, shortness of breath dyspnea on exertion or numbness in extremities     Objective:   Physical Exam General: No apparent distress obese Hispanic female Hip: ROM FULL RANGE OF MOTION Strength full and symmetric bilaterally Pelvic alignment unremarkable to inspection and palpation. Standing hip rotation and gait without trendelenburg / unsteadiness. Greater trochanter without tenderness to palpation.  tenderness over piriformis and none over greater trochanter. No SI joint tenderness and normal minimal SI movement. Patient does have a significant half inch leg leg discrepancy right-sided shorter. Patient though has had this corrected with a heel lift and doing well. Pes planus bilaterally patient continues to wear her arch supports.     Assessment & Plan:

## 2011-10-27 NOTE — Patient Instructions (Signed)
It is very good to see you. I am very glad you're pain is gone. I would continue to wear tennis shoes whenever you are out doing a lot of activity. Try to increase her activity level to attempt to lose weight. Try walking biking or swimming with one or 2 days of some weightlifting. Any activity is better than sitting If you are going to wear sandals wear one with a back strap on it. As long as her pain is doing well you only need to see Dr. Ashley Royalty from now on. Happy new year!   Es muy bueno verte. Estoy muy contenta de que ests dolor se ha ido.  Yo continuara el uso de zapatos tenis cada vez que se fuera haciendo un montn de [place]. Trate de aumentar su nivel de actividad para tratar de bajar de [place].Trate de caminar andar en bicicleta o nadar con uno o 2 das de algunos de pesas. Cualquier actividad es mejor que estar sentado. Si usted va a usar sandalias usar uno con una correa de espalda. Siempre y Conservation officer, nature le est yendo bien slo es necesario ver al doctor Ashley Royalty, de ahora en adelante.Feliz ao nuevo!

## 2011-10-27 NOTE — Assessment & Plan Note (Signed)
Resolved and no need for manipulation today and will followup as needed.

## 2011-11-28 ENCOUNTER — Ambulatory Visit (INDEPENDENT_AMBULATORY_CARE_PROVIDER_SITE_OTHER): Payer: Self-pay | Admitting: Family Medicine

## 2011-11-28 ENCOUNTER — Encounter: Payer: Self-pay | Admitting: Family Medicine

## 2011-11-28 DIAGNOSIS — M2170 Unequal limb length (acquired), unspecified site: Secondary | ICD-10-CM

## 2011-11-28 DIAGNOSIS — E119 Type 2 diabetes mellitus without complications: Secondary | ICD-10-CM

## 2011-11-28 DIAGNOSIS — I10 Essential (primary) hypertension: Secondary | ICD-10-CM

## 2011-11-28 MED ORDER — LISINOPRIL 20 MG PO TABS: 20.0000 mg | ORAL_TABLET | Freq: Every day | ORAL | Status: DC

## 2011-11-28 MED ORDER — FERROUS SULFATE 325 (65 FE) MG PO TABS: 325.0000 mg | ORAL_TABLET | Freq: Two times a day (BID) | ORAL | Status: DC

## 2011-11-28 MED ORDER — SIMVASTATIN 80 MG PO TABS: 80.0000 mg | ORAL_TABLET | Freq: Every day | ORAL | Status: DC

## 2011-11-28 MED ORDER — METFORMIN HCL 1000 MG PO TABS: 1000.0000 mg | ORAL_TABLET | Freq: Every day | ORAL | Status: DC

## 2011-11-28 NOTE — Patient Instructions (Signed)
I have refilled your medications, sent electronically. Your blood pressure is good today! Continue using heel pads in shoes, doing exercises as Dr. Katrinka Blazing suggested. Make a follow up appointment with Dr. Katrinka Blazing.

## 2011-11-29 NOTE — Assessment & Plan Note (Signed)
At goal. Labs/renal function normal recently. Refilled lisinopril, statin and metformin today.

## 2011-11-29 NOTE — Progress Notes (Signed)
  Subjective:    Patient ID: Meredith Maynard, female    DOB: November 21, 1965, 46 y.o.   MRN: [id number]  HPI  1. Foot pain. Initially evaluated by Dr. Katrinka Blazing. Patient has been using heel insert daily and has great improvement of symptoms. Her only request is a new insert as she was told needed replacement q3 months. Has not been using any pain meds.  2. Med refills. Requests refills of chronic meds including lisinopril, metformin. Denies knowledge of side effects. No edema, chest pain, change in urinary habits. Does not check CBGs regularly. Recent labs reviewed, wnl.   Review of Systems See HPI     Objective:   Physical Exam  Vitals reviewed. Constitutional: She is oriented to person, place, and time. She appears well-developed and well-nourished. No distress.  HENT:  Head: Normocephalic and atraumatic.  Cardiovascular: Normal rate, regular rhythm, normal heart sounds and intact distal pulses.   No murmur heard. Pulmonary/Chest: Effort normal.  Musculoskeletal: She exhibits no edema and no tenderness.  Neurological: She is alert and oriented to person, place, and time.  Skin: No rash noted.        Assessment & Plan:

## 2011-11-29 NOTE — Assessment & Plan Note (Signed)
Pain resolved with heel pad/lift. I have replaced old pad today. Pt to f/u with Dr. Katrinka Blazing as needed or in 3 months.

## 2011-12-07 ENCOUNTER — Encounter: Payer: Self-pay | Admitting: Family Medicine

## 2011-12-07 ENCOUNTER — Ambulatory Visit (INDEPENDENT_AMBULATORY_CARE_PROVIDER_SITE_OTHER): Payer: Self-pay | Admitting: Family Medicine

## 2011-12-07 DIAGNOSIS — R2230 Localized swelling, mass and lump, unspecified upper limb: Secondary | ICD-10-CM

## 2011-12-07 DIAGNOSIS — M999 Biomechanical lesion, unspecified: Secondary | ICD-10-CM

## 2011-12-07 DIAGNOSIS — M259 Joint disorder, unspecified: Secondary | ICD-10-CM

## 2011-12-07 DIAGNOSIS — M2170 Unequal limb length (acquired), unspecified site: Secondary | ICD-10-CM

## 2011-12-07 NOTE — Patient Instructions (Signed)
It is good to see you. I hope to manipulation helped please do the exercises as I showed you. This will keep you align longer. I will send you to see a general surgeon for the lipoma on you're back. We will call you when we get an appointment. I have scheduled you a followup appointment with me as well. If you need to change it please call.  Es bueno verte.Espero manipulacin ayudado por favor [employer] ejercicios como te ense.  Esto mantendr la alineacin de [place]. Yo te enviar a ver a un cirujano general para el lipoma en que hayas vuelto. Lo llamaremos cuando tengamos una cita. Te he programado una cita de seguimiento con m. Si tiene que cambiar lo llame.  Trigger Finger Trigger Finger El dedo en gatillo (tendinitis y tenosinovitis estenosante digitales) es un trastorno comn que causa una dolorosa frecuencia de captura de los dedos o Multimedia programmer. Se produce como un clic, chasquido o bloqueo de un dedo en la palma de la mano. La razn de esto es que hay un problema con los tendones que flexionan los dedos de deslizamiento suavemente a travs de sus vainas. La causa de esto puede ser la inflamacin del tendn y la vaina, o de un engrosamiento o ndulo en el tendn. La condicin puede ocurrir en cualquier dedo o un par de dedos al [employer]. La causa puede ser el uso excesivo mientras se hace la misma actividad una y otra vez con las manos. Los tendones son cordones resistentes que [employer] con los [place]. Los msculos y los tendones son parte del sistema que le permite a su cuerpo a moverse. Cuando los msculos se contraen en el antebrazo en el lado de la palma, tiran hacia los tendones del codo y hace que los dedos y el pulgar para doblar (flexin) hacia la palma. Estos son los Warden/ranger. Los tendones se deslizan a travs de una membrana resbaladiza suave (membrana sinovial) que se llama la vaina del tendn. Las vainas tienen reas difciles de tejidos fibrosos que los  rodean, que [employer] tendones cerca del [place]. Estos se llaman poleas, ya que trabajan como una polea. La primera polea est en la palma de la mano cerca del pliegue que se extiende a travs de la palma. Si el rea de la engrosamiento del tendn est cerca de la polea, el tendn no puede deslizarse suavemente a travs de la polea y esto hace que el dedo del gatillo. El dedo se puede bloquear con el dedo curvado o de repente enderezar con un chasquido. Esto es ms comn en pacientes con artritis reumatoide y la diabetes. Si no se trata, la enfermedad puede empeorar hasta el punto donde el dedo se bloquea en flexin, como hacer un puo, o menos comnmente bloqueado con el dedo embrollo. DIAGNSTICO Su mdico le har fcil el diagnstico en el examen. TRATAMIENTO Inmovilizacin durante 6 a 8 semanas de [employer] ser de [place]. Utilice las tablillas como sugiere su mdico. El calor utilizado durante veinte minutos por lo menos cuatro veces al da, seguido por los paquetes de hielo durante veinte minutos, a menos que indique lo contrario por su mdico pueden ser tiles. Si encuentra que el calor o el fro parece estar haciendo el problema peor, deja de usarlos y consulte con su mdico para obtener instrucciones. Las inyecciones de cortisona junto con frula puede acelerar la recuperacin. Varias inyecciones puede ser requerida. La cortisona puede dar alivio despus de una inyeccin. Slo tome  over-the-counter o medicamentos recetados para el dolor, el [address] o [place], segn las indicaciones de su mdico. La ciruga es otro tratamiento que se puede usar si los tratamientos conservadores utilizando inyeccin y entablillado no funciona. La ciruga puede ser menor sin incisiones (un corte no tiene que Geographical information systems officer) y se puede hacer con Neomia Dear aguja a travs de la piel. No se requiere sutura y Games developer de los pacientes pueden volver al trabajo el [employer]. Otras opciones quirrgicas implican un procedimiento  abierto en el que el cirujano abre la mano a travs de una pequea incisin (corte) y corta la polea para que el tendn pueda deslizarse de nuevo sin problemas. Su mano todava tendrn ningn problema. Esta pequea operacin requiere puntos de sutura y la recuperacin ser un poco ms y las incisiones deben ser protegidos hasta que est completamente curado. Puede que tenga que limitar sus actividades durante un mximo de 6 meses. La terapia ocupacional o la mano puede ser necesaria si hay rigidez que queda en el dedo. RIESGOS Y COMPLICACIONES Las complicaciones son poco frecuentes, pero hay algunos problemas que pueden ocurrir: La recurrencia del dedo del gatillo. Esto no significa que la ciruga no se Arboriculturist. Simplemente significa que es posible que haya formado el tejido cicatricial despus de una ciruga que hace que el problema vuelva a ocurrir. Infeccin que podra arruinar [employer] de la ciruga y Microbiologist en un dedo que se congela y no pueden moverse normalmente. Lesin del nervio es posible que podra Forensic scientist en entumecimiento permanente de uno o ms dedos. CUIDADO DESPUS DE LA CIRUGA Eleve su mano sobre su corazn y usar hielo segn las instrucciones. Siga las instrucciones relativas a movimiento de los dedos / ejercicio. Mantenga la herida quirrgica se seque por lo menos 48 horas o ms tiempo si se lo indica. Mantenga sus citas de seguimiento. Retorno a las [employer] de [employer] y las instrucciones. Busque atencin mdica inmediata si: Sus problemas estn empeorando o no obtiene alivio con [address]. Documento de lanzamiento: 08/06/2004 revisado del documento: 06/29/2011 Documento Revisado: 03/31/2009 [employer]  Informacin para el Paciente  [address], [place].

## 2011-12-07 NOTE — Progress Notes (Signed)
  Subjective:    Patient ID: Meredith Maynard, female    DOB: 27-Jan-1966, 46 y.o.   MRN: [id number]  HPI Patient is here accompanied with a translator. Patient is Spanish-speaking only. Patient is here for followup of sciatica pain. Patient at last visit was found to have a leg length discrepancy and some pes planus bilaterally. Patient was given a heel lift and exercises to do during this time. Patient was doing very well for some time but in the last 10 days it started having a sciatica pain again. Still right-sided but still much better than it was originally. Patient is doing exercises as religiously as she used to.   Patient is also complaining of some right sided radiation of pain from her neck down into her right arm. Patient states she's had this intermittently for some time but recently has seemed to be getting much worse. Patient denies any type of injury denies any new workout routine or anything changing from her regular activities of daily living. Patient states that it seems that she can feel a mass on her shoulder and she thinks this is where the pain is coming from. She states with certain movements she has a very extreme burning sensation. Patient denies any weakness any rash any animal bites.  Review of Systems As stated in history of present illness    Objective:   Physical Exam General: No apparent distress obese Shoulder exam: Patient on palpation does have an ill-defined lipoma or other fatty tissue mass over the posterior aspect of the scapular spine distally. This is somewhat tender to palpation in with compression patient states she has the radiation of pain. Patient though has full range of motion of the shoulder negative Hawkins and Neer tests. Negative apprehension test  OMT Findings: Cervical: C3 flexed rotated and side bent right Thoracic T1-4 rotated right side bent left curve Lumbar: L. 3 flexed rotated and side bent right Sacrum: Right posterior sacrum Anterior  ilium on right     Assessment & Plan:

## 2011-12-07 NOTE — Progress Notes (Signed)
Interpreter Ashby Dawes [place] 13.30 Dr Katrinka Blazing

## 2011-12-08 DIAGNOSIS — D179 Benign lipomatous neoplasm, unspecified: Secondary | ICD-10-CM

## 2011-12-08 HISTORY — DX: Benign lipomatous neoplasm, unspecified: D17.9

## 2011-12-08 NOTE — Assessment & Plan Note (Signed)
Discussed with patient to continue to wear the shoes with the lift today. Gave her website to wear she can find other lifts on line. Encourage exercises and stretching.

## 2011-12-08 NOTE — Assessment & Plan Note (Signed)
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 When I get imaging at this time. Discussed with patient about potential medications to help with this neuropathic pain patient declined. Patient will followup again with me and at that time if still having pain we'll consider doing imaging studies. Is also having significant amount of pain we will need to consider a surgical referral. No red flags such as weight loss weakness or increasing fatigue and make you think of cancers or anything else to be life-threatening.

## 2011-12-08 NOTE — Assessment & Plan Note (Signed)
After verbal consent pt did have HVLA, with marked improvement.  Gave side effects to look out for and can take anti inflammatories in the acute time frame.   

## 2011-12-14 ENCOUNTER — Encounter: Payer: Self-pay | Admitting: Family Medicine

## 2011-12-14 ENCOUNTER — Ambulatory Visit (INDEPENDENT_AMBULATORY_CARE_PROVIDER_SITE_OTHER): Payer: Self-pay | Admitting: Family Medicine

## 2011-12-14 ENCOUNTER — Ambulatory Visit (HOSPITAL_COMMUNITY)
Admission: RE | Admit: 2011-12-14 | Discharge: 2011-12-14 | Disposition: A | Discharge status: Home / Self Care | Payer: Self-pay | Source: Ambulatory Visit | Attending: Family Medicine | Admitting: Family Medicine

## 2011-12-14 DIAGNOSIS — R079 Chest pain, unspecified: Secondary | ICD-10-CM | POA: Insufficient documentation

## 2011-12-14 NOTE — Patient Instructions (Addendum)
Ha sido un placer atenderla hoy. El electrocardiograma que se hizo esta completamente normal. Tome Advil(ibuprofeno) 3 tabletas (600mg ) cada 8 horas, si puede tomelos con alimentos. esto hagalo por 5 dias. Despues deje de tomarlo y solo uselo si el dolor persiste. Tambien puede tomar el Flexeryl como lo tiene indicado antes. Haga una cita con su medico si no mejora con el tratamiento.   Dolor msculoesqueltico (Musculoskeletal Pain) El dolor musculoesqueltico se siente en huesos y msculos. El dolor puede ocurrir en cualquier parte del cuerpo. El profesional que lo asiste podr tratarlo sin Geologist, engineering causa del dolor. Lo tratar [employer] de laboratorio (sangre y [place]), las radiografas y otros estudios sean normales. La causa de estos dolores puede ser un virus.  CAUSAS Generalmente no existe una causa definida para este trastorno. Tambin el [employer] puede deberse a la [place]. En la actividad excesiva se incluye el hacer ejercicios fsicos muy intensos cuando no se est en buena forma. El dolor de huesos tambin puede deberse a cambios climticos. Los huesos son sensibles a los cambios en la presin atmosfrica. INSTRUCCIONES PARA EL CUIDADO DOMICILIARIO  Para proteger su privacidad, no se entregarn los [employer] pruebas por telfono. Asegrese de conseguirlos. Consulte el modo en que podr obtenerlos si no se lo han informado. Es su responsabilidad contar con los [employer].   Utilice los medicamentos de venta libre o de prescripcin para Chief Technology Officer, Environmental health practitioner o la [place], segn se lo indique el profesional que lo asiste.Si le han administrado medicamentos, no conduzca, no opere maquinarias ni Diplomatic Services operational officer, y tampoco firme documentos legales durante 24 horas. No beba alcohol. No tome pldoras para dormir ni otros medicamentos que Museum/gallery curator.   Podr seguir con todas las actividades a menos que stas le ocasionen  ms [employer]. Cuando el dolor disminuya, es importante que gradualmente reanude toda la rutina habitual. Retome las actividades comenzando lentamente. Aumente gradualmente la intensidad y la duracin de sus actividades o del ejercicio.   Durante los perodos de dolor intenso, el reposo en cama puede ser beneficioso. Recustese o sintese en la posicin que le sea ms cmoda.   Coloque hielo sobre la zona afectada.   Ponga hielo en Lucile Shutters.   Colquese una toalla entre la piel y la bolsa de hielo.   Aplique el hielo durante 10 a 20 minutos 3  4 veces por da.   Si el dolor empeora, o no desaparece puede ser [employer] repetir las pruebas o Education officer, environmental nuevos exmenes. El profesional que lo asiste podr requerir investigar ms profundamente para Veterinary surgeon causa posible.  SOLICITE ATENCIN MDICA DE INMEDIATO SI:  Siente que el dolor empeora y no se alivia con los medicamentos.   Siente dolor en el pecho asociado a falta de aire, sudoracin, nuseas o vmitos.   El dolor se localiza en el abdomen.   Comienza a sentir nuevos sntomas que parecen ser diferentes o que lo preocupan.  ASEGRESE DE QUE:   Comprende las instrucciones para el alta mdica.   Controlar su enfermedad.   Solicitar atencin mdica de inmediato segn las indicaciones.  Document Released: 07/27/2005 Document Revised: 06/29/2011 [facility] Patient Information 2012 [place], [place].

## 2011-12-14 NOTE — Progress Notes (Signed)
  Subjective:    Patient ID: Meredith Maynard, female    DOB: September 23, 1966, 46 y.o.   MRN: [id number]  HPI Pt complaining of pain on her chest that started 4 days ago, the pain is sharp 5/10 and worsens with movement. It was initially on upper left external side but now has irradiated to the back and and sometimes her left arm is affected all the way to her left wrist and fingers. The pain is not related to exertion, it is relieved spontaneously and reproducible with light touch.  Pt with Hx of DMT2, Hyperlipidemia and obesity that has multiple musculoskeletal conditions for which she receives massage therapy.  Review of Systems  Constitutional: Positive for appetite change. Negative for fever, diaphoresis and fatigue.  HENT: Negative for neck pain.   Respiratory: Negative for apnea, chest tightness and shortness of breath.   Cardiovascular: Positive for chest pain. Negative for palpitations and leg swelling.  Gastrointestinal: Negative for abdominal distention.  Genitourinary: Negative.   Musculoskeletal: Positive for back pain and arthralgias. Negative for joint swelling.  Neurological: Negative for dizziness, weakness, numbness and headaches.       Objective:   Physical Exam  Constitutional: She is oriented to person, place, and time. No distress.  HENT:  Mouth/Throat: Oropharynx is clear and moist.  Eyes: Conjunctivae and EOM are normal. Pupils are equal, round, and reactive to light.  Neck: Neck supple. No JVD present.  Cardiovascular: Normal rate, regular rhythm, normal heart sounds and intact distal pulses.  Exam reveals no gallop and no friction rub.   No murmur heard. Pulmonary/Chest: Effort normal and breath sounds normal. No respiratory distress. She has no wheezes. She has no rales.       Chest wall painful to light touch at the level of 3 and 4 costosternal joint.   Abdominal: Soft. Bowel sounds are normal.  Musculoskeletal: She exhibits no edema.  Neurological: She is alert  and oriented to person, place, and time. She has normal reflexes.       No neurologic focalization.           Assessment & Plan:

## 2012-01-02 ENCOUNTER — Ambulatory Visit: Payer: Self-pay | Admitting: Family Medicine

## 2012-04-11 ENCOUNTER — Encounter: Payer: Self-pay | Admitting: Family Medicine

## 2012-04-11 ENCOUNTER — Ambulatory Visit (INDEPENDENT_AMBULATORY_CARE_PROVIDER_SITE_OTHER): Payer: Self-pay | Admitting: Family Medicine

## 2012-04-11 VITALS — BP 116/82 | HR 83 | Temp 98.6°F | Ht 65.0 in | Wt 246.0 lb

## 2012-04-11 DIAGNOSIS — M2550 Pain in unspecified joint: Secondary | ICD-10-CM

## 2012-04-11 HISTORY — DX: Pain in unspecified joint: M25.50

## 2012-04-11 LAB — COMPLETE METABOLIC PANEL WITH GFR
ALT: 20 U/L (ref 0–35)
AST: 16 U/L (ref 0–37)
BUN: 10 mg/dL (ref 6–23)
Calcium: 8.7 mg/dL (ref 8.4–10.5)
Chloride: 106 mEq/L (ref 96–112)
Creat: 0.49 mg/dL — ABNORMAL LOW (ref 0.50–1.10)
GFR, Est African American: >89 mL/min
Total Bilirubin: 0.4 mg/dL (ref 0.3–1.2)

## 2012-04-11 LAB — CBC
HCT: 30.9 % — ABNORMAL LOW (ref 36.0–46.0)
MCH: 23.1 pg — ABNORMAL LOW (ref 26.0–34.0)
MCV: 75 fL — ABNORMAL LOW (ref 78.0–100.0)
Platelets: 182 10*3/uL (ref 150–400)
RDW: 15.9 % — ABNORMAL HIGH (ref 11.5–15.5)

## 2012-04-11 LAB — CK: Total CK: 60 U/L (ref 7–177)

## 2012-04-11 MED ORDER — TRAMADOL HCL 50 MG PO TABS: 50.0000 mg | ORAL_TABLET | Freq: Three times a day (TID) | ORAL | Status: DC | PRN

## 2012-04-11 NOTE — Patient Instructions (Addendum)
Gracias por venir hoy.  Regresse en 2-4 semanas con Dr. Ashley Royalty.  Tome tramadol cada 6 horas para Chief Technology Officer.  Si los [employer] de lab [employer] algo anormal yo Scientist, research (medical).   Artralgia (Arthralgia) El profesional que le asiste ha diagnosticado que usted padece de artralgia. Artralgia significa simplemente dolor en una articulacin. Las causas pueden ser muchas; entre ellas se incluyen:  Una lesin en la articulacin que provoca inflamacin (molestias).   Desgaste de las articulaciones que se produce con el avance de los aos (osteoartritis).   Uso excesivo de Nurse, learning disability.   Diversas formas de artritis.   Infecciones en la articulacin.  Cualquiera que sea la causa del dolor, generalmente hay buena respuesta a los antiinflamatorios y al reposo. La excepcin ocurre cuando la articulacin est infectada y [address], si la causa es una infeccin bacteriana (por una bacteria), se tratan con antibiticos (medicamentos que [employer] grmenes). INSTRUCCIONES PARA EL CUIDADO DOMICILIARIO  Mantenga en reposo la zona lesionada durante el tiempo que se lo indique su mdico, o el tiempo que le indique el profesional que lo asiste. Luego comience a Chemical engineer esa articulacin lentamente del modo en que se lo aconseje el profesional y a Dentist se lo permita. El uso de [employer] puede ser beneficioso en el caso de lesiones en el tobillo, rodilla o cadera. Si la rodilla est entablillada o enyesada, muvala y cudela como se le indic. Si hoy le han colocado un venda elstica o que se estira), debe retirarlo y colocarlo nuevamente cada 3  4 horas. No debe aplicarlo de manera muy apretada, pero s con la firmeza necesaria para evitar la hinchazn. Vigile los dedos de las manos y los pies y observe si se hinchan, se tornan azules, se enfran o entumecen o siente dolor intenso. Si se presenta alguno de estos sntomas (problemas), retire el vendaje y aplquelo nuevamente de un modo ms flojo. Si  estos sntomas persisten, contacte al profesional que lo asiste o acuda nuevamente a Educational psychologist.   Durante las primeras 24 horas, mientras est [place], mantenga elevada la extremidad [employer] 2 almohadas.   Mientras se encuentra despierto Systems analyst aplique hielo durante 15 a 20 minutos, cada dos horas, para Engineer, materials; luego 3 a 4 veces por Environmental consultant las primeras 48 horas. Ponga el hielo en una bolsa plstica y coloque una toalla entre la bolsa y la piel.   Use la tablilla, el yeso, el vendaje elstico o el cabestrillo segn se le ha indicado.   Utilice los medicamentos de venta libre o de prescripcin para Chief Technology Officer, Environmental health practitioner o la [place], segn se lo indique el profesional que lo asiste. No tomeaspirina inmediatamente despus de la lesin a menos que se lo haya indicado su mdico. La aspirina puede ocasionarle un aumento en el sangrado y moretones en los tejidos.   Si se le aconsej la utilizacin de [place], contine usndolas segn las instrucciones y no vuelva a soportar peso sobre la articulacin lesionada hasta que se le indique que puede [place].  Un dolor persistente y la incapacidad para Chemical engineer el rea afectada durante 2  3 das son seales de Control and instrumentation engineer que le indican que debe consultar a un profesional para Education officer, environmental un seguimiento cuanto antes. Al comienzo, una fractura (ruptura del hueso) lineal puede no ser visible en las radiografas. Un dolor e hinchazn persistentes indican que necesita una evaluacin ms profunda, que no debe Clinical cytogeneticist o soportar  peso (use las muletas si se le ha indicado) y/o que debe realizarse radiografas complementarias. En muchos casos las radiografas no revelan las fracturas pequeas hasta una semana o diez das ms tarde. Concierte una cita para Medical sales representative seguimiento con el profesional que lo asiste o con aqul al que lo hayan remitido. Es posible que un radilogo (especialista en la lectura de  radiografas) revise nuevamente sus placas. Asegrese de Financial risk analyst cmo retirar los resultados de sus radiografas. No piense que el resultado es normal por el hecho de que no lo hayamos llamado. SOLICITE ATENCIN MDICA SI: Aumentan los moretones, la hinchazn o Chief Technology Officer. SOLICITE ATENCIN MDICA DE INMEDIATO SI:  Sus dedos estn adormecidos o presentan una Secondary school teacher.   El dolor no responde a los medicamentos y sigue igual o Insurance underwriter.   El dolor en la articulacin se intensifica.   La temperatura eleva por encima de 102 F (38.9 C).   Le resulta cada vez ms difcil moverla.  EST SEGURO QUE:    Comprende las instrucciones para el alta mdica.   Controlar su enfermedad.   Solicitar atencin mdica de inmediato segn las indicaciones.  Document Released: 10/17/2005 Document Revised: 10/06/2011 [facility] Patient Information 2012 [place], [place].

## 2012-04-11 NOTE — Progress Notes (Signed)
Meredith Maynard is a 46 y.o. female who presents to [facility] today for generalized body aches. Patient has pain in multiple joints over the last 3 days. She denies any injury or specific thing that started her pain.  She has pain in her shoulders elbows hands knees and ankles bilaterally.  She denies any muscle pain.  She denies any fevers chills rashes flulike illness or tick bite.  She tried taking Advil which is not very effective.  She thinks perhaps her mother had rheumatoid arthritis.  Additionally she wonders if simvastatin is causing muscle pain.    PMH: Reviewed significant for obesity diabetes and leg length discrepancy History  Substance Use Topics  . Smoking status: Never Smoker   . Smokeless tobacco: Never Used  . Alcohol Use: Not on file   ROS as above  Medications reviewed. Current Outpatient Prescriptions  Medication Sig Dispense Refill  . aspirin (BAYER CHILDRENS ASPIRIN) 81 MG chewable tablet Chew 81 mg by mouth daily.        . Calcium Carb-Cholecalciferol (CALCIUM 1000 + D) 1000-800 MG-UNIT TABS Take 1 tablet by mouth daily.  90 tablet  1  . lisinopril (PRINIVIL,ZESTRIL) 20 MG tablet Take 1 tablet (20 mg total) by mouth daily. Label in spanish  90 tablet  3  . metFORMIN (GLUCOPHAGE) 1000 MG tablet Take 1 tablet (1,000 mg total) by mouth daily. daily  30 tablet  6  . simvastatin (ZOCOR) 80 MG tablet Take 1 tablet (80 mg total) by mouth at bedtime.  31 tablet  3  . Blood Glucose Monitoring Suppl (BLOOD GLUCOSE METER) kit Use to check fasting glucose in the morning.  1 each  0  . Blood Glucose Monitoring Suppl (ONE TOUCH ULTRA SYSTEM KIT) W/DEVICE KIT 1 kit by Does not apply route once.  1 each  0  . ferrous sulfate (FERROUSUL) 325 (65 FE) MG tablet Take 1 tablet (325 mg total) by mouth 2 (two) times daily. Label in spanish  31 tablet  6  . traMADol (ULTRAM) 50 MG tablet Take 1 tablet (50 mg total) by mouth every 8 (eight) hours as needed for pain.  30 tablet  0    Exam:  BP  116/82  Pulse 83  Temp 98.6 F (37 C) (Oral)  Ht 5\' 5"  (1.651 m)  Wt 246 lb (111.585 kg)  BMI 40.94 kg/m2 Gen: Well NAD, nontoxic appearing HEENT: EOMI,  MMM Lungs: CTABL Nl WOB Heart: RRR no MRG Abd: NABS, NT, ND Exts: Non edematous BL  LE, warm and well perfused.  MSK: Normal shoulder elbow hand and wrist motion bilaterally. No apparent joint effusions or synovitis. Normal gait and station.   No results found for this or any previous visit (from the past 72 hour(s)).

## 2012-04-11 NOTE — Assessment & Plan Note (Signed)
New problem with an uncertain diagnosis.  She does not appear to be clinically or systemically ill. She does have diffuse pain that is not consistent with myalgias or fibromyalgia.  She does not have significant joint effusions or synovitis that I can determine today.  Additionally she denies any systemic illnesses or tick bites.   She does have potential  positive family history for rheumatoid arthritis.   Plan to initiate a workup today with CBC, CMP, CK, ESR, rheumatoid factor, and ANA. We'll start treating symptoms with tramadol.  Advised patient to followup with her primary care doctor in 2-4 weeks. Discussed warning signs or symptoms and provided a handout in [place].  Please see discharge instructions. Patient expresses understanding.

## 2012-04-12 LAB — ANA: Anti Nuclear Antibody(ANA): NEGATIVE

## 2012-04-12 LAB — SEDIMENTATION RATE: Sed Rate: 14 mm/hr (ref 0–22)

## 2012-04-13 LAB — RHEUMATOID FACTOR: Rhuematoid fact SerPl-aCnc: <10 IU/mL (ref <=14)

## 2012-04-20 ENCOUNTER — Emergency Department (HOSPITAL_COMMUNITY)
Admission: EM | Admit: 2012-04-20 | Discharge: 2012-04-20 | Disposition: A | Discharge status: Home / Self Care | Payer: Worker's Compensation | Attending: Emergency Medicine | Admitting: Emergency Medicine

## 2012-04-20 ENCOUNTER — Emergency Department (HOSPITAL_COMMUNITY): Payer: Worker's Compensation

## 2012-04-20 ENCOUNTER — Encounter (HOSPITAL_COMMUNITY): Payer: Self-pay | Admitting: Emergency Medicine

## 2012-04-20 DIAGNOSIS — M25571 Pain in right ankle and joints of right foot: Secondary | ICD-10-CM

## 2012-04-20 DIAGNOSIS — Z79899 Other long term (current) drug therapy: Secondary | ICD-10-CM | POA: Insufficient documentation

## 2012-04-20 DIAGNOSIS — Y9229 Other specified public building as the place of occurrence of the external cause: Secondary | ICD-10-CM | POA: Insufficient documentation

## 2012-04-20 DIAGNOSIS — W010XXA Fall on same level from slipping, tripping and stumbling without subsequent striking against object, initial encounter: Secondary | ICD-10-CM | POA: Insufficient documentation

## 2012-04-20 DIAGNOSIS — I10 Essential (primary) hypertension: Secondary | ICD-10-CM | POA: Insufficient documentation

## 2012-04-20 DIAGNOSIS — M542 Cervicalgia: Secondary | ICD-10-CM | POA: Insufficient documentation

## 2012-04-20 DIAGNOSIS — M25579 Pain in unspecified ankle and joints of unspecified foot: Secondary | ICD-10-CM | POA: Insufficient documentation

## 2012-04-20 DIAGNOSIS — E119 Type 2 diabetes mellitus without complications: Secondary | ICD-10-CM | POA: Insufficient documentation

## 2012-04-20 DIAGNOSIS — E785 Hyperlipidemia, unspecified: Secondary | ICD-10-CM | POA: Insufficient documentation

## 2012-04-20 DIAGNOSIS — Z7982 Long term (current) use of aspirin: Secondary | ICD-10-CM | POA: Insufficient documentation

## 2012-04-20 DIAGNOSIS — M549 Dorsalgia, unspecified: Secondary | ICD-10-CM | POA: Insufficient documentation

## 2012-04-20 HISTORY — DX: Essential (primary) hypertension: I10

## 2012-04-20 MED ORDER — DIAZEPAM 5 MG PO TABS: 5.0000 mg | ORAL_TABLET | Freq: Two times a day (BID) | ORAL | Status: DC

## 2012-04-20 MED ORDER — IBUPROFEN 800 MG PO TABS: 800.0000 mg | ORAL_TABLET | Freq: Three times a day (TID) | ORAL | Status: DC

## 2012-04-20 MED ORDER — HYDROCODONE-ACETAMINOPHEN 5-325 MG PO TABS: 1.0000 | ORAL_TABLET | ORAL | Status: DC | PRN

## 2012-04-20 NOTE — ED Notes (Signed)
Pt returned from CT °

## 2012-04-20 NOTE — Discharge Instructions (Signed)
Your xrays are normal appearing and do not show any signs of broken bones. Your pain is likely coming from your muscles. You have been given prescriptions for prescription strength ibuprofen and Valium, a muscle relaxer. Please take these as prescribed. The Valium can make you sleepy so do not drive or operate machinery while taking this. You have been given a prescription for Norco as well - save this for severe pain only. You can apply ice over any areas that are particularly painful. Return to the ER if you develop numbness or weakness in your legs, increased pain, new symptoms, or worsening condition.  Sus radiografas son normales, apareciendo y no muestran signos de huesos rotos. Su dolor es probablemente viene de los msculos. Se le ha dado recetas para la prescripcin ibuprofeno fuerza y ??Valium, un relajante muscular. Por favor, tome esto como prescrito. El Valium puede causar sueo, as que no conducir o Company secretary maquinaria mientras est [employer]. Se le ha dado una receta para Norco, as - guardar esta para Investment banker, operational. Puede aplicar hielo [employer] reas que son particularmente dolorosos. Volver a la sala de emergencias si presenta entumecimiento o debilidad en las piernas, aumento del dolor, sntomas nuevos o empeoramiento de la condicin.  Dolor de espalda en el adulto (Back Pain, Adult) El dolor de cintura es frecuente. Aproximadamente 1 de cada 5 personas lo sufren.La causa rara vez pone en peligro la vida. Con frecuencia mejora luego de algn tiempo.Alrededor de la mitad de las personas que sufren un inicio sbito de dolor de cintura, se sentirn mejor luego de 2 semanas. Aproximadamente 8 de cada 10 se sentirn mejor luego de 6 semanas.  CAUSAS  Algunas causas comunes son:   Distensin de los msculos o ligamentos que sostienen la columna vertebral.   Desgaste (degeneracin) de los discos vertebrales.   Artritis   Traumatismos directos en la espalda.  DIAGNSTICO   La mayor parte de las veces, la causa directa no se conoce.Sin embargo, Chief Technology Officer puede tratarse efectivamente an cuando no se Best boy.Una de las formas ms precisas de asegurar que la causa del dolor no constituye un peligro es responder a las preguntas del mdico acerca de su salud y sus sntomas. Si el mdico necesita ms informacin, podr indicar anlisis de laboratorio o Education officer, environmental un diagnstico por imgenes (radiografas o Health visitor).Sin embargo, aunque las [employer] modificaciones, generalmente no es necesaria la [place].  INSTRUCCIONES PARA EL CUIDADO EN EL HOGAR  En algunas personas, el dolor de espalda vuelve.Como rara vez es peligroso, los pacientes pueden aprender a Interior and spatial designer.   Mantngase activo. Si permanece sentado o de pie mucho tiempo en el mismo lugar, se tensiona la espalda.  No se siente, maneje ni se quede parado en un mismo lugar por ms de 30 minutos. Realice caminatas cortas en superficies planas ni bien el dolor haya cedido. Trate de Copy tiempo que camina .   No se quede en la cama.Si hace reposo durante ms de 1 o 2 das, puede Estate agent.   No evite los ejercicios ni el trabajo.El cuerpo est hecho para moverse.No es peligroso estar [place], aunque le duela la espalda.La espalda se curar ms rpido si contina sus actividades antes de que el dolor se vaya.   Preste atencin a su cuerpo cuando se incline y se levante. Muchas personas sienten menos molestias cuando levantan objetos si doblan las rodillas, mantienen la carga cerca del cuerpo y evitan torcerse.  Generalmente, las posiciones ms cmodas son las que ejercen menos tensin en la espalda en recuperacin.   Encuentre una posicin cmoda para dormir. Utilice un colchn firme y recustese de [place]. Doble ligeramente sus rodillas. Si se recuesta sobre su espalda, coloque una almohada debajo de sus rodillas.   Tome slo medicamentos de venta  libre o recetados, segn las indicaciones del mdico. Los medicamentos de venta libre para Primary school teacher y reducir Futures trader, son los que en general ms ayudan.El mdico podr prescribirle relajantes musculares.Estos medicamentos calman el dolor de modo que pueda retornar a sus actividades normales y a Investment banker, operational.   Aplique hielo sobre la zona lesionada.   Ponga el hielo en una bolsa plstica.   Colquese una toalla entre la piel y la bolsa de hielo.   Deje la bolsa de hielo durante 15 a 3 a 4 veces por da, durante los primeros 2  3 das. Luego podr alternar Eusebio Me calor y hielo para reducir Chief Technology Officer y los espasmos.   Consulte a su mdico si puede tratar de hacer ejercicios para la espalda y recibir un masaje suave. Pueden ser beneficiosos.   Evite sentirse ansioso o estresado.El estrs aumenta la tensin muscular y puede empeorar el dolor de espalda.Es importante reconocer cuando est ansioso o estresado y aprender la forma de controlarlos.El ejercicio es una gran opcin.  SOLICITE ATENCIN MDICA SI:   Siente un dolor que no se alivia con reposo o medicamentos.   El dolor no mejora en 1 semana.   Desarrolla nuevos sntomas.   No se siente bien en general.  SOLICITE ATENCIN MDICA DE INMEDIATO SI:  Siente un dolor que se irradia desde la espalda hacia sus piernas.   Desarrolla nuevos problemas en el intestino o la vejiga.   Siente debilidad o adormecimiento inusual en sus brazos o piernas.   Presenta nuseas o vmitos.   Presenta dolor abdominal.   Se siente desfalleciente.  Document Released: 10/17/2005 Document Revised: 10/06/2011 [facility] Patient Information 2012 [place], [place].

## 2012-04-20 NOTE — ED Notes (Signed)
Pt sts she fell at work yesterday slipping on wet floor.  Pt sts pain "all over back" and medial side of rt ankle.  Pt denies pain elsewhere.  Equal strength bilat feet/grip.  No obvious deformities.  Pt ambulatory to room from triage.

## 2012-04-20 NOTE — ED Provider Notes (Signed)
Medical screening examination/treatment/procedure(s) were performed by non-physician practitioner and as supervising physician I was immediately available for consultation/collaboration.   Doyne Micke, MD 04/20/12 1610 

## 2012-04-20 NOTE — ED Notes (Signed)
Pt sts fell yesterday at work.  sts she slipped on wet floor.  Pt c/o right foot/ankle pain and back pain 8/10.  sts its her entire back that hurts.  Pt only speaks spanish.  Interpreter phone used.

## 2012-04-20 NOTE — ED Provider Notes (Signed)
History     CSN: [id number]  Arrival date & time 04/20/12  0808   First MD Initiated Contact with Patient 04/20/12 0845      Chief Complaint  Patient presents with  . Back Pain    (Consider location/radiation/quality/duration/timing/severity/associated sxs/prior treatment) HPI History from patient. 46 year old female who presents after a fall at work yesterday. 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 She slid forward and her right foot ran into the base of the sick. She did not hit her head or lose consciousness. She states that she fell flat onto her back. Currently complaining of pain all over her back and to the medial aspect of her right ankle, radiating up into her calf. She took 3 Advil at home which was somewhat helpful but did not completely relieve the pain. She has been ambulatory but states that the pain worsens when she weightbears on the right foot. She denies any numbness or weakness in her legs, bowel/bladder dysfunction, incontinence, saddle anesthesia.  Past Medical History  Diagnosis Date  . Anxiety   . Panic attacks   . Meniere syndrome   . T2DM (type 2 diabetes mellitus)   . GERD (gastroesophageal reflux disease)   . HLD (hyperlipidemia)   . Somatic complaints, multiple   . Hypertension     Past Surgical History  Procedure Date  . Umbilical hernia repair 2007    Dr. Lindie Spruce  . Small intestine surgery 2007    Dr. Lindie Spruce  . Cesarean section 2000,2004,2005    x3  . Exercise treadmill test 2010    Normal, poor exercise tolerance  . Tubal ligation   . Abdominal u/s 2010    Other than fatty liver, normal    Family History  Problem Relation Age of Onset  . Hypertension Brother   . Stroke Brother 40  . Heart attack Brother 45    History  Substance Use Topics  . Smoking status: Never Smoker   . Smokeless tobacco: Never Used  . Alcohol Use: No    OB History    Grav Para Term Preterm  Abortions TAB SAB Ect Mult Living                  Review of Systems  Constitutional: Negative.   HENT: Negative for neck pain.   Respiratory: Negative for cough and shortness of breath.   Cardiovascular: Negative for chest pain.  Gastrointestinal: Negative for nausea, vomiting and abdominal pain.  Musculoskeletal: Positive for myalgias and back pain. Negative for joint swelling, arthralgias and gait problem.  Skin: Negative for color change and rash.  Neurological: Negative for dizziness, weakness and numbness.    Allergies  Latex  Home Medications   Current Outpatient Rx  Name Route Sig Dispense Refill  . ASPIRIN EC 81 MG PO TBEC Oral Take 81 mg by mouth daily.    . IBUPROFEN 200 MG PO TABS Oral Take 600 mg by mouth every 6 (six) hours as needed. For pain    . LISINOPRIL 20 MG PO TABS Oral Take 1 tablet (20 mg total) by mouth daily. Label in spanish 90 tablet 3  . METFORMIN HCL 1000 MG PO TABS Oral Take 1,000 mg by mouth daily.    Marland Kitchen SIMVASTATIN 80 MG PO TABS Oral Take 1 tablet (80 mg total) by mouth at bedtime. 31 tablet 3  . VITAMIN B-12 1000 MCG PO TABS Oral Take 1,000 mcg by mouth daily.    Marland Kitchen  ONETOUCH ULTRA SYSTEM W/DEVICE KIT Does not apply 1 kit by Does not apply route once. 1 each 0    BP 109/51  Pulse 73  Temp 98.4 F (36.9 C) (Oral)  Resp 16  SpO2 99%  Physical Exam  Nursing note and vitals reviewed. Constitutional: She is oriented to person, place, and time. She appears well-developed and well-nourished. No distress.  HENT:  Head: Normocephalic and atraumatic.  Mouth/Throat: Oropharynx is clear and moist. No oropharyngeal exudate.  Neck: Normal range of motion.  Cardiovascular: Normal rate, regular rhythm and normal heart sounds.   Pulmonary/Chest: Effort normal and breath sounds normal.  Abdominal: Soft. There is no tenderness.  Musculoskeletal: Normal range of motion.       Spine: No palpable stepoff, crepitus, or gross deformity appreciated. No  appreciable spasm of paravertebral muscles. Tender to palpation over the midthoracic spine.  Right ankle: No visible swelling, deformity, ecchymoses. There is no crepitus on palpation, but the patient does have tenderness over the posterior and medial calcaneus. Full range of motion at the ankle with minimal pain elicited with this. Thompson test normal. Neurovascularly intact with sensory intact to light touch. DP/PT pulses intact.   Neurological: She is alert and oriented to person, place, and time. No cranial nerve deficit.  Skin: Skin is warm and dry. She is not diaphoretic.  Psychiatric: She has a normal mood and affect.    ED Course  Procedures (including critical care time)  Labs Reviewed - No data to display Dg Cervical Spine Complete  04/20/2012  *RADIOLOGY REPORT*  Clinical Data: Fall.  Neck pain.  CERVICAL SPINE - COMPLETE 4+ VIEW  Comparison: None.  Findings: Vertebral body height and alignment are normal. Prevertebral soft tissues appear normal.  Lung apices are clear.  IMPRESSION: Negative exam.  Original Report Authenticated By: Bernadene Bell. Maricela Curet, M.D.   Dg Thoracic Spine 2 View  04/20/2012  *RADIOLOGY REPORT*  Clinical Data: Fall, pain.  THORACIC SPINE - 2 VIEW  Comparison: None.  Findings: Mild convex right scoliosis is seen.  There is no fracture or subluxation.  Scattered anterior endplate spurring is noted.  Paraspinous structures unremarkable.  IMPRESSION: No acute finding.  Original Report Authenticated By: Bernadene Bell. D'ALESSIO, M.D.   Dg Lumbar Spine Complete  04/20/2012  *RADIOLOGY REPORT*  Clinical Data: Fall, pain.  LUMBAR SPINE - COMPLETE 4+ VIEW  Comparison: CT abdomen and pelvis 11/17/2009.  Findings: Vertebral body height and alignment are normal. Intervertebral disc space height is maintained.  No pars interarticularis defect.  Paraspinous structures unremarkable.  IMPRESSION: Negative exam.  Original Report Authenticated By: Bernadene Bell. D'ALESSIO, M.D.   Dg Ankle  Complete Right  04/20/2012  *RADIOLOGY REPORT*  Clinical Data: Fall, pain.  RIGHT ANKLE - COMPLETE 3+ VIEW  Comparison: None.  Findings: No acute bony or joint abnormality is identified.  Small plantar calcaneal spur is noted.  Soft tissues unremarkable.  IMPRESSION: Negative exam.  Original Report Authenticated By: Bernadene Bell. D'ALESSIO, M.D.     1. Ankle pain, right   2. Back pain       MDM  Patient presents after slipping and falling on a wet floor yesterday while at work. Currently complaining of pain to her right ankle and back. X-rays are negative for fracture or other acute abnormality. Patient will be treated with pain medication. Instructed to followup with PCP if symptoms persist. She verbalized understanding and was agreeable. Discharge instructions given in [place] and [place].        Santina Evans  Mayford Knife, PA-C 04/20/12 1326

## 2012-04-20 NOTE — ED Notes (Signed)
Vital signs stable. 

## 2012-04-27 ENCOUNTER — Ambulatory Visit (INDEPENDENT_AMBULATORY_CARE_PROVIDER_SITE_OTHER): Payer: Worker's Compensation | Admitting: Family Medicine

## 2012-04-27 VITALS — BP 101/70 | HR 72 | Temp 98.6°F | Ht 65.0 in | Wt 246.0 lb

## 2012-04-27 DIAGNOSIS — E119 Type 2 diabetes mellitus without complications: Secondary | ICD-10-CM

## 2012-04-27 DIAGNOSIS — I10 Essential (primary) hypertension: Secondary | ICD-10-CM

## 2012-04-27 DIAGNOSIS — R42 Dizziness and giddiness: Secondary | ICD-10-CM

## 2012-04-27 DIAGNOSIS — R109 Unspecified abdominal pain: Secondary | ICD-10-CM | POA: Insufficient documentation

## 2012-04-27 DIAGNOSIS — R350 Frequency of micturition: Secondary | ICD-10-CM

## 2012-04-27 LAB — POCT URINALYSIS DIPSTICK
Bilirubin, UA: NEGATIVE
Glucose, UA: NEGATIVE
Ketones, UA: NEGATIVE
Leukocytes, UA: NEGATIVE
Protein, UA: NEGATIVE

## 2012-04-27 MED ORDER — ACCU-CHEK NANO SMARTVIEW W/DEVICE KIT: 1.0000 | PACK | Freq: Once | Status: DC

## 2012-04-27 MED ORDER — GLUCOSE BLOOD VI STRP: ORAL_STRIP | Status: DC

## 2012-04-27 MED ORDER — GLUCOSE BLOOD VI STRP: ORAL_STRIP | Status: AC

## 2012-04-27 MED ORDER — ACCU-CHEK MULTICLIX LANCETS MISC: Status: AC

## 2012-04-27 MED ORDER — ACCU-CHEK MULTICLIX LANCETS MISC: Status: DC

## 2012-04-27 NOTE — Assessment & Plan Note (Signed)
Feelings of dizziness today that her chronic for patient. She described this as leg weakness. Orthostatics were negative. No neurologic changes on exam and no weakness in her legs. Advised hydration and followup next week at previously scheduled appointment for Monday.

## 2012-04-27 NOTE — Progress Notes (Signed)
  Subjective:    Patient ID: Meredith Maynard, female    DOB: 06-22-1966, 46 y.o.   MRN: [id number]  HPI Patient presents today with complaints of 12 hours of urinary frequency without burning. She also notes a little bit of dizziness today when she is walking. And she says that she feels tired. She has a slight headache posteriorly without any vision changes, phonophobia, photophobia. Patient denies vaginal discharge. She denies chest pain or shortness of breath. She denies diarrhea and says she is having normal bowel movements. She says she's had headaches like this in the past. Her main complaint is that she didn't sleep well because she had to urinate frequently throughout the night.  Review of Systems Denies CP, SOB,  N/V/D, fever     Objective:   Physical Exam Vital signs reviewed General appearance - alert, well appearing, and in no distress Heart - normal rate, regular rhythm, normal S1, S2, no murmurs, rubs, clicks or gallops Chest - clear to auscultation, no wheezes, rales or rhonchi, symmetric air entry, no tachypnea, retractions or cyanosis Abdomen - soft, mild tenderness LUQ, nondistended, no masses or organomegaly Extremities - peripheral pulses normal, no pedal edema, no clubbing or cyanosis Neurological - alert, oriented, normal speech, no focal findings or movement disorder noted, screening mental status exam normal, neck supple without rigidity, cranial nerves II through XII intact able to get up and down from table easily. Full-strength upper and lower extremities.         Assessment & Plan:

## 2012-04-27 NOTE — Assessment & Plan Note (Signed)
Blood sugar up slightly which could contribute to her feeling of urinary frequency. Urine is normal today.

## 2012-04-27 NOTE — Assessment & Plan Note (Signed)
Patient with low normal blood pressures today are consistent with previous. No orthostasis. Followup Monday for recheck.

## 2012-04-27 NOTE — Patient Instructions (Addendum)
Your urine looks okay today I am sorry that you're not feeling well Please drink lots of water and try to stay hydrated in this heat  Please take your iron pills Please come back and see her regular doctor in the next one to 2 weeks for followup

## 2012-04-30 ENCOUNTER — Ambulatory Visit (INDEPENDENT_AMBULATORY_CARE_PROVIDER_SITE_OTHER): Payer: Self-pay | Admitting: Family Medicine

## 2012-04-30 ENCOUNTER — Ambulatory Visit: Payer: Self-pay | Admitting: Family Medicine

## 2012-04-30 ENCOUNTER — Encounter: Payer: Self-pay | Admitting: Family Medicine

## 2012-04-30 VITALS — BP 101/68 | HR 67 | Temp 98.4°F | Ht 65.0 in | Wt 244.0 lb

## 2012-04-30 DIAGNOSIS — D509 Iron deficiency anemia, unspecified: Secondary | ICD-10-CM

## 2012-04-30 DIAGNOSIS — I10 Essential (primary) hypertension: Secondary | ICD-10-CM

## 2012-04-30 DIAGNOSIS — R1012 Left upper quadrant pain: Secondary | ICD-10-CM

## 2012-04-30 LAB — VITAMIN B12: Vitamin B-12: 569 pg/mL (ref 211–911)

## 2012-04-30 LAB — CBC
HCT: 33.4 % — ABNORMAL LOW (ref 36.0–46.0)
Hemoglobin: 10.4 g/dL — ABNORMAL LOW (ref 12.0–15.0)
RDW: 16.1 % — ABNORMAL HIGH (ref 11.5–15.5)
WBC: 7.9 10*3/uL (ref 4.0–10.5)

## 2012-04-30 LAB — IRON AND TIBC: %SAT: 39 % (ref 20–55)

## 2012-04-30 LAB — RETICULOCYTES: ABS Retic: 81.9 10*3/uL (ref 19.0–186.0)

## 2012-04-30 LAB — FOLATE: Folate: 16.1 ng/mL

## 2012-04-30 NOTE — Assessment & Plan Note (Addendum)
A: unclear etiology. Pain is L sided and not epigastric which argues against gastritis however she also has anemia in the setting in chronic NSAID use I am concerned for gastritis/peptic ulcer. I have also considered pancreatitis and intermittent bowel obstruction related to her umbilical hernia.  P:  -check stool cards x 3 if FOBT positive patient will need colonoscopy vs. EGD. -stop ibuprofen take tylenol instead.  -check lipase.

## 2012-04-30 NOTE — Progress Notes (Signed)
Interpreter Yarlin Breisch Namihira for Dr Funches  

## 2012-04-30 NOTE — Assessment & Plan Note (Signed)
A: anemia, slightly microcytic. Patient compliant with iron and BI2. P: -check iron studies, folate, B12 level and reticulocyte count -check stool for GI bleed -continue iron and B12 supplements. -we discussed how iron can cause constipation, patient will need to be on a stool softener.

## 2012-04-30 NOTE — Patient Instructions (Addendum)
[place],  1. Tajetas fecales x 3. Envielas a Event organiser.  2. No tome el advil. 3. Tome el tylenol 500 mg (1 or 2) cada cuatro horas.   Dr. Armen Pickup

## 2012-04-30 NOTE — Progress Notes (Signed)
Subjective:     Patient ID: Meredith Maynard, female   DOB: February 16, 1966, 46 y.o.   MRN: [id number]  HPI 46 [facility] F [facility]   1. L sided abdominal pain: off on on x several years. Pain is described a cramping, non-sharp. Occasionally radiates to the back. The pain last for anywhere between 20 minutes and 2 hours. She denies associated nausea, diarrhea or gross blood in stool/melena. She admits to baseline constipation. Nothing makes the pain worse. Passing gas makes the pain better. Of note, she takes ibuprofen 400-600 mg 2-3x per day chronically for bilateral leg pain/muscle aches associated with work. She works as a Advertising copywriter.   2. BP f/u: was seen in clinic 3 days ago for dizziness. Also had low BP and anemia noted at that time. Has been taking iron. Reports that her dizziness has improved.  3. Anemia: has restarted iron. Denies GI bleed or abnormally heavy menses. Is compliant with B12 supplementation.    History   Social History  . Marital Status: Married    Spouse Name: N/A    Number of Children: 3  . Years of Education: 12   Occupational History  . Housekeeper [employer]   Social History Main Topics  . Smoking status: Never Smoker   . Smokeless tobacco: Never Used  . Alcohol Use: No  . Drug Use: No  . Sexually Active: Yes   Other Topics Concern  . Not on file   Social History Narrative   Working part time as Sports administrator since 7/06.    Review of Systems As per HPI  Objective:   Physical Exam BP 101/68  Pulse 67  Temp 98.4 F (36.9 C) (Oral)  Ht 5\' 5"  (1.651 m)  Wt 244 lb (110.678 kg)  BMI 40.60 kg/m2  LMP 04/01/2012 General appearance: alert, cooperative and no distress Lungs: clear to auscultation bilaterally Heart: regular rate and rhythm, S1, S2 normal, no murmur, click, rub or gallop Abdomen: obese, soft, non-tender; bowel sounds normal; no masses,  no organomegaly Neurologic: Grossly normal  Assessment and Plan:  See  problem list

## 2012-05-07 ENCOUNTER — Telehealth: Payer: Self-pay | Admitting: Family Medicine

## 2012-05-07 ENCOUNTER — Encounter: Payer: Self-pay | Admitting: Family Medicine

## 2012-05-07 ENCOUNTER — Ambulatory Visit (INDEPENDENT_AMBULATORY_CARE_PROVIDER_SITE_OTHER): Payer: Self-pay | Admitting: Family Medicine

## 2012-05-07 VITALS — BP 100/60 | HR 66 | Ht 65.0 in | Wt 247.0 lb

## 2012-05-07 DIAGNOSIS — I10 Essential (primary) hypertension: Secondary | ICD-10-CM

## 2012-05-07 DIAGNOSIS — R42 Dizziness and giddiness: Secondary | ICD-10-CM

## 2012-05-07 LAB — GLUCOSE, CAPILLARY: Glucose-Capillary: 221 mg/dL — ABNORMAL HIGH (ref 70–99)

## 2012-05-07 MED ORDER — METFORMIN HCL 1000 MG PO TABS: 1000.0000 mg | ORAL_TABLET | Freq: Two times a day (BID) | ORAL | Status: DC

## 2012-05-07 MED ORDER — LISINOPRIL 20 MG PO TABS: 10.0000 mg | ORAL_TABLET | Freq: Every day | ORAL | Status: DC

## 2012-05-07 NOTE — Progress Notes (Signed)
Subjective:     Patient ID: Meredith Maynard, female   DOB: Mar 09, 1966, 46 y.o.   MRN: [id number]  HPI 46 yo F presents for same day visit to discuss persistent dizziness:  Diziness: x 2 weeks now. Patient is most concerned that the dizziness occurs at work and she is being asked/required to work full time by her employer although she applied for a part time position. She denies associated worsening symptoms with position changes,  CP, palpitation or syncope. She does admit to feeling lightheaded.   Med history: DM2 and iron deficiency anemia.  Review of Systems As per HPI    Objective:   Physical Exam BP 100/60  Pulse 66  Ht 5\' 5"  (1.651 m)  Wt 247 lb (112.038 kg)  BMI 41.10 kg/m2  LMP 05/06/2012 Orthostatic vital signs: lying BP 95/63, P 79; sitting 91/59, P 80; standing 91/59 P 86.  General appearance: alert, cooperative and no distress Heart: regular rate and rhythm, S1, S2 normal, no murmur, click, rub or gallop Neurologic: Grossly normal  Point of care labs:  Random glucose: 236  Hemoglobin: 10.6 < 10.4  <9.5  Assessment and Plan:

## 2012-05-07 NOTE — Telephone Encounter (Signed)
Message copied by Dessa Phi on Mon May 07, 2012  1:46 PM ------      Message from: [place], SARAH T      Created: Tue May 01, 2012 10:27 AM                   ----- Message -----         From: [facility]         Sent: 04/30/2012   9:35 PM           To: Sarah T [place], MD

## 2012-05-07 NOTE — Progress Notes (Signed)
Interpreter Valencia Kassa Namihira for Dr Funches  

## 2012-05-07 NOTE — Patient Instructions (Addendum)
[place],   Gracias por venir [employer].   Venir hoy en una mes con doctor matthews por dizziness/anemia.   Dr. Armen Pickup

## 2012-05-07 NOTE — Assessment & Plan Note (Addendum)
A: non orthostatic dizziness. Anemia improving. DM declined, but has not been severely uncontrolled so I do not suspect autonomic dysfunction. BP low . Suspect secondary gain as patient does not want to work full time. P: -cut lisinopril in half. Left VM for patient in English will route to office staff to assist with informing patient in spanish.  -letter to employer provided writing that patient should be able to work part time hours for the part time position as needed.  -patient to f/u in 1 mos with her PCP.

## 2012-05-07 NOTE — Telephone Encounter (Signed)
error 

## 2012-05-07 NOTE — Telephone Encounter (Signed)
Ashby Dawes (Spanish interpreter) assisted with patient phone call. Called with results. Anemia improved. Continue supplemental iron.  Make sure to send/bring in stool cards.   Patient complains of persistent dizziness at home and work and wants to know when this will improve.  Informed patient that symptom should improve as her anemia improves. In addition she should be mindful regarding her hydration status and drink plenty of fluids when working (mostly water).   Patient request work excuse for missed days due to dizziness/information that she cannot work because of this. Advised patient to come in at 4 PM for re-evaluation. She has agreed to come in at 4 PM. No work excuse will be provided without evaluation.

## 2012-07-03 ENCOUNTER — Ambulatory Visit (INDEPENDENT_AMBULATORY_CARE_PROVIDER_SITE_OTHER): Payer: Self-pay | Admitting: Family Medicine

## 2012-07-03 ENCOUNTER — Encounter: Payer: Self-pay | Admitting: Family Medicine

## 2012-07-03 VITALS — BP 100/67 | HR 83 | Temp 99.2°F | Ht 65.0 in | Wt 248.0 lb

## 2012-07-03 DIAGNOSIS — M79671 Pain in right foot: Secondary | ICD-10-CM

## 2012-07-03 DIAGNOSIS — M79609 Pain in unspecified limb: Secondary | ICD-10-CM

## 2012-07-03 DIAGNOSIS — M79604 Pain in right leg: Secondary | ICD-10-CM

## 2012-07-03 MED ORDER — GABAPENTIN 300 MG PO CAPS: 300.0000 mg | ORAL_CAPSULE | Freq: Three times a day (TID) | ORAL | Status: DC

## 2012-07-03 MED ORDER — MEDICAL LEGWEAR/KNEE HIGH MISC: Status: DC

## 2012-07-03 NOTE — Patient Instructions (Addendum)
Thank you for coming in today, it was good to see you Take 300mg  of gabapentin each night for one week, then increase to 300mg  three times per day.    Neuropata diabtica (Diabetic Neuropathy) La neuropata diabtica es una complicacin frecuente causada por la diabetes. Neuropata es un trmino que significa enfermedad o lesin nerviosa. Si la diabetes no se controla y tiene un nivel elevado de glucosa (azcar) en la sangre, con el tiempo puede favorecer lesiones en los nervios de todo el cuerpo. Hay tres tipos de neuropata diabtica:   Perifrica   Autonomica   Focal  NEUROPATA PERIFRICA Es la forma ms frecuente de neuropata diabtica. Causa lesiones en los nervios de los pies y las piernas y finalmente de las manos y los brazos.  SNTOMAS La neuropata perifrica se produce lentamente con el tiempo. Los nervios perifricos sirven para el sentido del tacto, y las sensaciones de Airline pilot, fro y Engineer, mining. Cuando estos nervios ya no funcionan:   Los pies se adormecen   Ya no puede sentir presin o Advertising copywriter.   Puede sentir ardor o dolor punzante.  Esto puede causar:  Gruesas callosidades en las zonas de presin.   Llagas por compresin.   lceras Las lceras pueden infectarse con grmenes (bacterias) y pueden conducir a una infeccin de los huesos de los pies.  DIAGNSTICO El diagnstico de neuropata diabtica es difcil. Las pruebas de la funcin Dance movement psychotherapist se realizan del siguiente modo:  Con un ligero toque usando un monofilamento.   Vibraciones con un diapasn.   Sensaciones agudas con Actor.  Otras pruebas que ayudan a Actor diagnstico son:  Velocidad de conduccin nerviosa Controla la transmisin de la corriente Radio producer a travs de un nervio   Electromiografa Muestra como los msculos responden a seales elctricas transmitidas por los nervios circundantes.   Pruebas de calidad sensorial, que se utilizan para Psychologist, occupational en que los nervios  responden a las vibraciones y cambios de Marketing executive.  NEUROPATA AUTNOMA El sistema nervioso autnomo controla las funciones que realizamos sin pensar. Por ejemplo:   Latidos cardacos   Regulacin de Therapist, nutritional   Presin arterial   Orina   Digestin   Sudoracin   Funciones sexuales  SNTOMAS Los sntomas dependen de cules son los nervios afectados.   Puede haber problemas digestivos como:   Sentir ganas de vomitar (nuseas)   Vmitos   Hinchazn   Constipacin   Diarrea   Dolor abdominal.   Puede haber dificultad para orinar debido a la imposibilidad de sentir cuando la vejiga est llena. Puede haber prdida de orina (incontinencia) o imposibilidad para vaciar la vejiga completamente (retencin).   Palpitaciones o sensacin de latidos cardacos anormales.   La presin arterial desciende al levantarse (hipotensin ortosttica). Esto puede ocurrir cuando se levanta de la silla o se pone de pie. Esto le hace sentir:   Customer service manager de desmayo   Funciones sexuales   En los hombres, imposibilidad de Personnel officer y Theatre stage manager.   En las mujeres, sequedad vaginal y problemas relacionados con la disminucin del deseo y la excitacin sexual.  DIAGNSTICO Generalmente el diagnstico se basa en los sntomas que se informan. Comunquele a su mdico si experimenta:   Mareos.   Constipacin   Diarrea   Dificutad para orinar.   Imposibilidad de lograr o Designer, fashion/clothing.  Podrn solicitarle otras pruebas, por ejemplo:  Un ECG o monitoreo Holter. Estas pruebas evalan la existencia  de problemas con la frecuencia o el ritmo cardacos.   Radiografas para diagnosticar problemas con el vaciado del estmago en el intestino delgado despus de comer.  NEUROPATA FOCAL La neuropata focal afecta solo un tracto nervioso y ocurre sbitamente. Sin embargo, generalmente mejora por s misma con el tiempo. sta no produce lesiones  crnicas y slo se realiza tratamiento de los problemas involucrados. SNTOMAS Algunos ejemplos son:   Movimientos anormales de los ojos o alineacin anormal de ambos ojos.   Debilitamiento de los msculos de la [place].   Cada del pie, lo que da como resultado la imposibilidad de Warden/ranger. Esto hace que no camine ni pueda mover el pie normalmente.  DIAGNSTICO El diagnstico se realiza basndose en los sntomas y en lo que el mdico halle en el examen. Podrn solicitarle otras pruebas, por ejemplo:  Velocidad de conduccin nerviosa sta determina la transmisin de la corriente elctrica a travs de un nervio   Electromiografa Muestra como los msculos responden a seales elctricas transmitidas por los nervios circundantes.   Pruebas de calidad sensorial, que se utilizan para Psychologist, occupational en que los nervios responden a las vibraciones y cambios de Marketing executive.  TRATAMIENTO Una vez que el nervio se ha daado, ya no puede revertirse. El objetivo del tratamiento es impedir que la enfermedad empeore. Si empeora, afectar ms fibras nerviosas. La clave es controlar el nivel de azcar en la sangre. Deber mantener el nivel de glucosa y el A 1c en el rango indicado por su mdico. Lo que ayuda a controlar el nivel de glucosa en la sangre es:  Control del nivel de glucosa en [place].   Planificacin de las comidas.   Actividad fsica.   Medicamentos para la diabetes.  Con el tiempo, Secondary school teacher un nivel bajo de glucosa en sangre disminuye los sntomas. En algunos casos es necesaria la prescripcin de analgsicos. La neuropata focal es dolorosa e impredecible, y se produce generalmente en los ancianos diabticos  SOLICITE ATENCIN MDICA SI:  Presenta sntomas de neuropata perifrica grave, como ardor, adormecimiento o dolor en los pies, piernas o manos.   Presenta sntomas de neuropata autonmica, como:   Mareos.   Dificultad en el control urinario.    Imposibilidad de lograr o Designer, fashion/clothing.   Presenta sntomas de neuropata focal como movimientos anormales de los ojos o cada repentina del pie.  Document Released: 10/17/2005 Document Revised: 10/06/2011 [facility] Patient Information 2012 [place], [place].

## 2012-07-03 NOTE — Progress Notes (Signed)
Interpreter Kalaysia Demonbreun Namihira for Dr Mathews 

## 2012-07-08 DIAGNOSIS — M79604 Pain in right leg: Secondary | ICD-10-CM | POA: Insufficient documentation

## 2012-07-08 DIAGNOSIS — M79671 Pain in right foot: Secondary | ICD-10-CM | POA: Insufficient documentation

## 2012-07-08 NOTE — Assessment & Plan Note (Addendum)
Sounds like MSK pain.  Some radicular features as well, although exam is negative in reproducing this.  Gabapentin may help some with this if there are some radicular features.   No red flags.

## 2012-07-08 NOTE — Assessment & Plan Note (Signed)
I think her R foot pain is most likely related to possible neuropathy, with decreased sensation on R foot.  She does have a history of diabetes.  Will start her on gabapentin to see if this helps with her current symptoms.

## 2012-07-08 NOTE — Progress Notes (Signed)
  Subjective:    Patient ID: Meredith Maynard, female    DOB: 1966/10/24, 46 y.o.   MRN: [id number]  HPI 1. R leg pain:  Complaint of pain in her R leg and foot.  Describes two types of pain.  First she states that she has pain that radiating down the back of her buttock and leg.  The second is "burning" pain in her foot.  She does describes some parasthesias of her foot as well.  The foot pain is what bothers her most and gives her difficulty with sleeping.  She thinks it may be due to varicose veins.  She has not tried any medication for this.  She has received OMT manipulation for her pain that radiateds into her buttock, which helped in the past.     Review of Systems Denies bowel or bladder incontinence, foot drop, groin numbness.      Objective:   Physical Exam  Constitutional:       Obese female, nad   Cardiovascular: Normal rate and regular rhythm.   Pulmonary/Chest: Effort normal and breath sounds normal.  Musculoskeletal:       SLR: Negative bilaterally DTR: 2+ bilaterally. Multiple varicosities on upper R leg Foot exam:  DP and PT pulses 2+, No ulcerations of foot Monofilament testing with decreased sensation along forefoot on R.  L sensation decreased but able to tell at some spots.             Assessment & Plan:

## 2012-07-23 ENCOUNTER — Encounter: Payer: Self-pay | Admitting: Family Medicine

## 2012-07-23 ENCOUNTER — Ambulatory Visit (INDEPENDENT_AMBULATORY_CARE_PROVIDER_SITE_OTHER): Payer: Self-pay | Admitting: Family Medicine

## 2012-07-23 VITALS — BP 112/73 | HR 73 | Wt 252.0 lb

## 2012-07-23 DIAGNOSIS — M79671 Pain in right foot: Secondary | ICD-10-CM

## 2012-07-23 DIAGNOSIS — M79609 Pain in unspecified limb: Secondary | ICD-10-CM

## 2012-07-23 DIAGNOSIS — Z23 Encounter for immunization: Secondary | ICD-10-CM

## 2012-07-23 MED ORDER — GABAPENTIN 300 MG PO CAPS: 300.0000 mg | ORAL_CAPSULE | Freq: Three times a day (TID) | ORAL | Status: DC | PRN

## 2012-07-23 NOTE — Assessment & Plan Note (Signed)
She has many predisposing factors for right foot pain, including right sciatic pain and leg length discrepancy (right leg shorter).  -We will refer her to [facility] Medicine Clinic to see if she would be appropriate for better orthotics and for evaluation of foot pain. Her tenderness seems localized along the tendons in her medial foot. -Work note given to limit hours to 20 hours a week per patient request.  -She declines any other pain medication besides the ibuprofen. -Patient did not start gabapentin for right sciatica due to concerns that the medication may be too expensive and her not wanting to take more medications. We discussed how this medication should not be expensive and recommend she try a bedtime dose to see if it will make her more comfortable and decrease pain, which in turn may help her gait and foot pain.

## 2012-07-23 NOTE — Progress Notes (Signed)
  Subjective:    Patient ID: Meredith Maynard, female    DOB: 1966-09-15, 46 y.o.   MRN: [id number]  HPI # Right foot pain She has a history of right leg sciatic pain and leg length discrepancy (her right leg is shorter). Her sciatic pain is tolerable, however, her right heel has been hurting her more in the past few weeks.  She would like to be referred for new orthotics and evaluation by a specialist.   Review of Systems Per HPI  Allergies, medication, past medical history reviewed.  Significant for: -T2DM on metformin -HLD, obesity -Anxiety -HTN -GERD -Right side sciatica, leg length discrepancy (right leg is shorter)--she did not try gabapentin -Umbilical hernia    Objective:   Physical Exam GEN: NAD; obese; Spanish interpreter present RIGHT FOOT: heel lifts in right shoe; significant tenderness around circumference of heel without tenderness on heel pad itself; also significant tenderness medial foot (?along flexor hallucis longus, flexor digitorum longus tendons); pes planus; no erythema, warmth, swelling; 2+ pedal pulses, sensation intact, strength intact; no skin lesions    Assessment & Plan:

## 2012-07-23 NOTE — Patient Instructions (Signed)
Por favor, haga una cita con el clinica de medicina de deportes (Sports Medicine clinic).   Trate gabapentin para el dolor en la pierna. Puede tratar antes de dormir primero y si no tiene mucho sueno, puede tratar durante el dia.  Es un buen Forensic psychologist de los nervios.

## 2012-07-31 ENCOUNTER — Ambulatory Visit (INDEPENDENT_AMBULATORY_CARE_PROVIDER_SITE_OTHER): Payer: Self-pay | Admitting: Family Medicine

## 2012-07-31 VITALS — BP 111/73 | Ht 65.0 in | Wt 252.0 lb

## 2012-07-31 DIAGNOSIS — M722 Plantar fascial fibromatosis: Secondary | ICD-10-CM

## 2012-07-31 HISTORY — DX: Plantar fascial fibromatosis: M72.2

## 2012-07-31 MED ORDER — MELOXICAM 15 MG PO TABS: 15.0000 mg | ORAL_TABLET | Freq: Every day | ORAL | Status: DC

## 2012-07-31 NOTE — Patient Instructions (Addendum)
Fascitis plantar (sndrome del espoln en el taln) con rehabilitacin (Plantar Fasciitis, Heel Spur Syndrome, with Rehab) La fascia plantar es una estructura fibrosa, tipo ligamento, de tejido blando que abarca la parte inferior del pie. La fascitis plantar es una enfermedad que ocasiona dolor en el pie debido a la inflamacin del tejido. SNTOMAS  Dolor y sensibilidad en la planta del pie.   Dolor especialmente al ponerse de pie o caminar.  CAUSAS La fascitis plantar est causada por irritacin y lesin en la fascia plantar debajo del pie. Los mecanismos ms frecuentes de una lesin son:  Golpe directo en la planta del pie.   Dao a un pequeo nervio que [employer], Medical laboratory scientific officer en que la fascia principal se une al hueso del taln.   Estrs aplicado en la fascia plantar debido a espolones seos.  EL RIESGO AUMENTA CON:   Actividades que estresan la fascia plantar (correr, saltar, pivotar o cortar).   Poca fuerza y flexibilidad.   Calzado mal ajustado.   Msculos de la pantorrilla tensos.   Pie plano.   No hacer un precalentamiento adecuado.   Obesidad.  PREVENCIN  Precalentamiento adecuado y elongacin antes de la [place].   Descanso y recuperacin entre actividades.   Mantener la forma fsica:   Earma [place], flexibilidad y resistencia muscular.   Capacidad cardiovascular.   Mantenga un peso corporal adecuado.   Evite el estrs en la fascia plantar.   Para deportistas con pie plano, utilizacin de plantillas anatmicas para los arcos.  PRONSTICO Si se trata adecuadamente, generalmente es curable sin [place]. En ocasiones requiere someterse a [place]. POSIBLES COMPLICACIONES  La recurrencia frecuente de los sntomas puede dar como resultado un problema crnico.   Problemas en la cintura causados para compensar la lesin, como renguera.   Dolor o debilidad en el pie al adelantar el pie luego de la [place].   Inflamacin crnica, cicatrizacin y  ruptura parcial o completa de la fascia, que se produce luego de repetidas inyecciones.  TRATAMIENTO El tratamiento inicial incluye el uso de medicamentos y la aplicacin de hielo para reducir Chief Technology Officer y la inflamacin. Los ejercicios de elongacin y fortalecimiento pueden ayudar a reducir Chief Technology Officer con la actividad, en especial los dirigidos al tendn de [place]. Los ejercicios pueden Management consultant o con un terapeuta. El [employer] podr recomendar que utilice tacos o soportes para el arco para ayudar a Museum/gallery exhibitions officer de la fascia plantar. En algunos casos se indica una inyeccin de corticoides para reducir la inflamacin. Si los sntomas persisten por ms de 6 meses de tratamiento no quirrgico (conservador), se Public relations account executive.  MEDICAMENTOS  Si necesita analgsicos, se recomiendan los antiinflamatorios no esteroides, como aspirina e ibuprofeno y otros calmantes menores, como acetaminofeno   No tome medicamentos para Chief Technology Officer dentro de los [address] previos a la [place].   Los analgsicos prescriptos se indicarn si el mdico lo considera necesario. Utilcelos como se le indique y slo cuando lo necesite.   En algunos casos se indica una inyeccin de corticosteroides. Estas inyecciones deben reservarse para los [place] graves, porque slo se pueden administrar una determinada cantidad de veces.  CALOR Y FRO  El tratamiento con fro [employer] y reduce la inflamacin. El fro debe aplicarse durante 10 a 15 minutos cada 2  3 horas para reducir la inflamacin y Chief Technology Officer e inmediatamente despus de cualquier actividad que agrava los sntomas. Utilice bolsas de hielo o masajee la zona  con un trozo de hielo (masaje de hielo).   El calor puede usarse antes de Therapist, music y de las actividades de fortalecimiento indicadas por el profesional, le fisioterapeuta o Orthoptist. Utilice una bolsa trmica o sumerja la lesin en agua caliente.  SOLICITE ATENCIN MDICA DE INMEDIATO SI:  El  tratamiento no lo beneficia, o el trastorno empeora.   Los medicamentos producen efectos secundarios.  EJERCICIOS EJERCICIOS DE AMPLITUD DE MOVIMIENTOS Y ELONGACIN - Fascitis plantar (sndrome del espoln en el taln) Estos ejercicios le ayudarn en la recuperacin de la lesin. Los sntomas podrn aliviarse con o sin asistencia adicional de su mdico, fisioterapeuta o Herbalist. Al completar estos ejercicios, recuerde:   Restaurar la flexibilidad del tejido ayuda a que las articulaciones recuperen el movimiento normal. Esto permite que el movimiento y la actividad sea ms saludables y menos dolorosos.   Para que sea efectiva, cada elongacin debe realizarse durante al menos 30 segundos.   La elongacin nunca debe ser dolorosa. Deber sentir slo un alargamiento o distensin suave del tejido que estira.  AMPLITUD DE MOVIMIENTOS - Extensin de los dedos - flexin  Sintese con la pierna derecha / izquierdo cruzada sobre la rodilla opuesta.   [employer] dedos de los pies y empjelos hacia usted. Debe sentir un estiramiento suave en la zona inferior de los dedos y del pie.   Mantenga esta posicin durante __________ segundos.   Luego, tome los dedos de los pies y empjelos [place]. Debe sentir un estiramiento suave en la zona superior de los dedos y del pie.   Mantenga esta posicin durante __________ segundos.  Reptalo __________ veces. Realice este estiramiento __________ Anthoney Harada por da.  AMPLITUD DE MOVIMIENTOS - Dorsiflexin del tobillo - Beverly Milch, asistida  Qutese los zapatos y sintese en una silla, preferiblemente en una superficie sin alfombra.   Coloque el pie derecha / izquierdo debajo de la rodilla. Extienda la pierna contraria para estar apoyado.   Con el taln hacia abajo, deslice el pie derecha / izquierdo hacia la silla hasta que sienta un estiramiento en el tobillo o pantorrilla. Si no lo siente, deslice la cadera hacia adelante [employer] borde de la silla, manteniendo el  taln [place].   Mantenga esta posicin durante __________ segundos.  Reptalo __________ veces. Realice este estiramiento __________ Anthoney Harada por da.  ELONGACIN - Gastroc [employer] en la pared.   Extienda la pierna derecha / izquierdo y Dietitian la rodilla levemente flexionada.   Apunte los dedos ligeramente hacia adentro con el pie de atrs.   Mantenga el taln derecha / izquierdo en el suelo y la rodilla recta, cambie el peso hacia la pared y no permita que la espalda se arquee.   Debe sentir un estiramiento en la pantorrila derecha / izquierdo. Mantenga esta posicicin durante __________ segundos.  Reptalo __________ veces. Realice este estiramiento __________ Anthoney Harada por da. ELONGACIN - Sleo De pie  [employer] en la pared.   Extienda la pierna derecha / izquierdo y Dietitian la rodilla levemente flexionada.   Apunte los dedos ligeramente hacia adentro con el pie de atrs.   Mantenga el taln derecha / izquierdo en el suelo, doble la rodilla de atrs y cambie suavemente el peso sobre la pierna de atrs hasta que sienta un ligero estiramiento en la pantorrilla de atrs.   Mantenga esta posicicin durante __________ segundos.  Reptalo __________ veces. Realice este estiramiento __________ Anthoney Harada por da. ELONGACIN - Gastrocsoleus De pie Nota: Este ejercicio puede  ser muy estresante para el pie y el tobillo. Realice los ejercicios slo en la forma indicada por el profesional que lo asiste.   Coloque la regin metatarsiana del pie derecha / izquierdo y Scientist, research (medical) en el mismo escaln.   Si es necesario, sostngase de la pared o de la barandilla de una escalera para mantener el equilibrio.   Levante lentamente el [employer], y permita que el peso del cuerpo presione el taln sobre el borde del escaln.   Debe sentir un estiramiento en la pantorrilla derecha / izquierdo.   Mantenga esta posicicin durante __________ segundos.   Repita este ejercicio con la  rodilla derecha / izquierdo levemente flexionada.  Reptalo __________ veces. Realice este estiramiento __________ Anthoney Harada por da.  EJERCICIOS DE FORTALECIMIENTO - Fascitis plantar (sndrome del espoln en el taln) Estos ejercicios le ayudarn en la recuperacin de la lesin. Los sntomas podrn desaparecer con o sin mayor intervencin del profesional, el fisioterapeuta o Orthoptist. Al completar estos ejercicios, recuerde:   Los msculos pueden ganar tanto la resistencia como la fortaleza que necesita para sus actividades diarias a travs de ejercicios controlados.   Realice los ejercicios como se lo indic el mdico, el fisioterapeuta o Orthoptist. Aumente la resistencia y las repeticiones segn se le haya indicado.  FUERZA - Rollo de toalla  Sintese en una silla en una superficie no alfombrada.   Coloque el pie en [address], y mantenga el taln en el suelo.   Coloque la toalla alrededor del taln pero envuelva slo los dedos del pie. Mantenga el taln contra el piso.   Aada ____________________ al extremo de la toalla si el mdico, fisioterapeuta o entrenador se lo indican.  Reptalo __________ veces. Realice este ejercicio __________ veces por da. FUERZA - Inversin del tobillo  Asegure un extremo de una banda de goma para ejercicios a un objeto fijo (mesa, columna). Ate el extremo opuesto a su pie, justo antes de los dedos.   Coloque los puos [place] rodillas. Esto har que la fuerza se concentre en el tobillo.   Lentamente, tire del dedo g[place] y [place] Junes y asegrese de que la banda est posicionada de tal forma que puede resistir el movimiento completo.   Mantenga esta posicicin durante __________ segundos.   Haga que los msculos resistan la banda mientras tira lentamente el pie hacia atrs hasta la posicin inicial.  Reptalo __________ veces. Realice este ejercicio __________ veces por da.  Document Released: 08/03/2006 Document Revised:  10/06/2011 [facility] Patient Information 2012 [place], [place].

## 2012-07-31 NOTE — Progress Notes (Signed)
Chief complaint right foot pain History of present illness: Patient states that she's had a right leg sciatica and leg length discrepancy in the past. Patient states that the sciatic pain has become much more controlled but now her right heel has been hurting more. Patient has had arch bandages with minimal improvement. Patient was receiving some manipulation therapy with some minimal benefit previously. Patient now states though that the right heel pain is much worse than the sciatica. Patient states that it is worse with for steps the day and after sitting for long amount time. Patient denies any numbness in the area. Patient states that the pain has woken her up at night. Patient still able to her do her activities of daily living but has found it hard to wear certain shoes because of the pain. Patient denies any injury to the area.  Review of systems as stated above in history of present illness  Past medical surgical family and social history reviewed without any changes  Physical exam Filed Vitals:   07/31/12 1440  BP: 111/73   General: No apparent distress alert and oriented x3 patient's speaks [place] and Spanish so we have been able to make it through the interview well. Normal inspection with no visable or palpable fat pad atrophy and no visible swelling/erythema. Patient is tender at medial insertion of plantar fascia into calcaneus. Great toe motion:normal ROM Arch shape: mild breakdown of transverse arch Other foot breakdown: has callus on fourth toe plantar spect Patient on exam does have a leg length discrepancy with almost a quarter in shorter on the right leg.  Ultrasound was performed and interpreted by me today. Patient has significant hypoechoic changes surrounding the insertion of wet or fasciitis on the calcaneus. Patient plantar fascia measures approximately 1.2 cm in diameter.patient is tender on exam

## 2012-07-31 NOTE — Assessment & Plan Note (Signed)
Patient has what appears to be severe plantar fasciitis. Patient has been dealing with this for approximately 2 months and not noticing any improvement. Patient given new sports insoles with a heel lift on the right side to try to compensate for the leg length discrepancy that could be adding to the problem. Patient given exercises and told to ice 4 times daily. Patient also referred to formal physical therapy for evaluation and treatment and likely iontophoresis.patient will return in 4 weeks for further evaluation. If she continues to have pain we'll need to consider doing a possible injection.

## 2012-08-02 ENCOUNTER — Ambulatory Visit: Payer: Self-pay | Admitting: Rehabilitative and Restorative Service Providers"

## 2012-08-28 ENCOUNTER — Ambulatory Visit: Payer: Self-pay | Admitting: Family Medicine

## 2012-09-18 ENCOUNTER — Ambulatory Visit: Payer: Self-pay | Admitting: Family Medicine

## 2012-10-15 ENCOUNTER — Ambulatory Visit (INDEPENDENT_AMBULATORY_CARE_PROVIDER_SITE_OTHER): Payer: Self-pay | Admitting: Family Medicine

## 2012-10-15 ENCOUNTER — Encounter: Payer: Self-pay | Admitting: Family Medicine

## 2012-10-15 VITALS — BP 126/83 | HR 59 | Temp 98.3°F | Ht 65.0 in | Wt 250.7 lb

## 2012-10-15 DIAGNOSIS — H6690 Otitis media, unspecified, unspecified ear: Secondary | ICD-10-CM

## 2012-10-15 DIAGNOSIS — J302 Other seasonal allergic rhinitis: Secondary | ICD-10-CM

## 2012-10-15 DIAGNOSIS — R82998 Other abnormal findings in urine: Secondary | ICD-10-CM

## 2012-10-15 DIAGNOSIS — J309 Allergic rhinitis, unspecified: Secondary | ICD-10-CM

## 2012-10-15 DIAGNOSIS — H6691 Otitis media, unspecified, right ear: Secondary | ICD-10-CM

## 2012-10-15 LAB — POCT URINALYSIS DIPSTICK
Bilirubin, UA: NEGATIVE
Leukocytes, UA: NEGATIVE
Nitrite, UA: NEGATIVE
Protein, UA: NEGATIVE
Urobilinogen, UA: 0.2
pH, UA: 6

## 2012-10-15 MED ORDER — ANTIPYRINE-BENZOCAINE 5.4-1.4 % OT SOLN: 3.0000 [drp] | OTIC | Status: DC | PRN

## 2012-10-15 MED ORDER — AMOXICILLIN 500 MG PO CAPS: 500.0000 mg | ORAL_CAPSULE | Freq: Two times a day (BID) | ORAL | Status: AC

## 2012-10-15 MED ORDER — CETIRIZINE HCL 10 MG PO CAPS: 10.0000 mg | ORAL_CAPSULE | Freq: Every day | ORAL | Status: DC

## 2012-10-15 MED ORDER — FLUTICASONE PROPIONATE 50 MCG/ACT NA SUSP: 2.0000 | Freq: Every day | NASAL | Status: DC

## 2012-10-15 NOTE — Patient Instructions (Addendum)

## 2012-10-16 DIAGNOSIS — R82998 Other abnormal findings in urine: Secondary | ICD-10-CM | POA: Insufficient documentation

## 2012-10-16 DIAGNOSIS — H6691 Otitis media, unspecified, right ear: Secondary | ICD-10-CM | POA: Insufficient documentation

## 2012-10-16 NOTE — Progress Notes (Signed)
Family Medicine Office Visit Note   Subjective:   Patient ID: Meredith Maynard, female  DOB: 12-Mar-1966, 46 y.o.. MRN: [id number]   Pt that comes today complaining of right ear pain for 1 week. She reports history of 1 episode of vertigo 7 years ago that was worked up with CT scan and ENT evaluation. It resulted in decreased hearing and tinnitus since then with no further options for treatment. At this time she started experienced pain in right ear and worsening of her baseline tinnitus. The pain has become pulsatile 9/10 that radiated to inferior portion on mandibula up to her chin. The pain is intense not letting pt sleep.   Also she reports that 5 days ago the crown of her 3 right lower molar broke into pieces. She has been taking ibuprofen that helps but wears off very fast, lasting effect for about 6 hours. Pt also reports hx of allergy with frequent rhinorrhea.  Denies fever, ear drainage, dizziness or headaches.  Pt also complains today of dark and bad odor urine. Denies frequency, burning sensation or flank pain. Review of Systems:  Per HPI  Objective:   Physical Exam: Gen:  NAD, very talkative. Explaining her symptoms with no distress or difficulty. HEENT: Moist mucous membranes. Nose: clear rhinorrhea Right ear: 1/2 pf TM with yellowish fluid level seen through. Mild erythema and bulging of inferior portion of TM. Normal ear canal. No drainage. Left ear: TM with mild serous fluid level. No erythema or bulging. No tenderness on mastoid bone. No erythema. Mouth: 3rd right bottom molar with no crown. No erythema surrounded. No active drainage. Neuro: Alert and oriented x3. No focalization. CN II-XII intact.   Abdomen: no CVA tenderness. Normal BS.  Assessment & Plan:

## 2012-10-16 NOTE — Assessment & Plan Note (Signed)
Bulging and erythema with fluid level seen through. Most likely acute process or secondary infected serous otitis. No TM perforation. Plan: Symptomatic local treatment for pain. Amoxicillin for 7 days. Cetirizine and Flonase to help with allergy and reduce amount of nasal secretions. F/u with PCP as needed. Instructed pt she needs a dental appointment.

## 2012-10-16 NOTE — Assessment & Plan Note (Signed)
Reported dark urine and strong odor, no other symptoms that suggest UTI. Normal UA Plan: Instructed to drink plenty of water and f/u if other symptoms appear.

## 2012-10-26 ENCOUNTER — Encounter: Payer: Self-pay | Admitting: Family Medicine

## 2012-10-26 ENCOUNTER — Ambulatory Visit (INDEPENDENT_AMBULATORY_CARE_PROVIDER_SITE_OTHER): Payer: Self-pay | Admitting: Family Medicine

## 2012-10-26 VITALS — BP 106/69 | HR 85 | Temp 98.2°F | Ht 65.0 in | Wt 248.6 lb

## 2012-10-26 DIAGNOSIS — R42 Dizziness and giddiness: Secondary | ICD-10-CM

## 2012-10-26 MED ORDER — ACETAMINOPHEN-CODEINE #3 300-30 MG PO TABS: 1.0000 | ORAL_TABLET | ORAL | Status: DC | PRN

## 2012-10-26 MED ORDER — MECLIZINE HCL 50 MG PO TABS: 50.0000 mg | ORAL_TABLET | Freq: Three times a day (TID) | ORAL | Status: DC | PRN

## 2012-10-26 NOTE — Assessment & Plan Note (Signed)
Feel this is chronic problem, no further work up need be done.  Rx for antivert to try for dizziness.  Advised to check blood sugar if it happens again. F/U soon for chronic medical problem management.

## 2012-10-26 NOTE — Patient Instructions (Signed)
Please try the Antivert for your dizziness.  Please check your blood sugar if you are dizzy again.  Please make an appointment for a check up next month.  Pruebe lo Antivert para sus mareos. Por favor verifique su nivel de azcar en la sangre si est mareado de nuevo.Por favor, haga una cita para un chequeo el prximo mes.

## 2012-10-26 NOTE — Progress Notes (Signed)
  Subjective:    Patient ID: Meredith Maynard, female    DOB: 1966/02/09, 46 y.o.   MRN: [id number]  HPI  [place] comes in for follow up of her ear infection.  She was diagnosed with an ear infection about 10 days ago.  She took the antibiotic and her ear felt better.    However, this morning she had an episode of dizziness that lasted several hours.  She could not go to work because of it.  She describes both room spinning and light-headedness.  She denies chest pain or dyspnea during the dizziness.  She is wondering if the fact that she has not been taking her cholesterol pill has anything to do with it.  She says she has been taking her blood pressure medications and her diabetes medications, but did not check her blood pressure or blood sugar during this episode.  She has had a Vertigo worked up in the past, but no definite etiology was found.   Review of Systems Pertinent items in HPI    Objective:   Physical Exam BP 106/69  Pulse 85  Temp 98.2 F (36.8 C) (Oral)  Ht 5\' 5"  (1.651 m)  Wt 248 lb 9 oz (112.747 kg)  BMI 41.36 kg/m2  LMP 10/01/2012 General appearance: alert, cooperative and no distress Eyes: conjunctivae/corneas clear. PERRL, EOM's intact. Fundi benign. Neck: no adenopathy, no carotid bruit, no JVD, supple, symmetrical, trachea midline and thyroid not enlarged, symmetric, no tenderness/mass/nodules Lungs: clear to auscultation bilaterally Heart: regular rate and rhythm, S1, S2 normal, no murmur, click, rub or gallop Extremities: extremities normal, atraumatic, no cyanosis or edema       Assessment & Plan:

## 2012-11-05 ENCOUNTER — Encounter: Payer: Self-pay | Admitting: Family Medicine

## 2012-11-05 ENCOUNTER — Ambulatory Visit (INDEPENDENT_AMBULATORY_CARE_PROVIDER_SITE_OTHER): Payer: Self-pay | Admitting: Family Medicine

## 2012-11-05 VITALS — BP 104/72 | HR 76 | Temp 98.1°F | Ht 65.0 in | Wt 246.0 lb

## 2012-11-05 DIAGNOSIS — R42 Dizziness and giddiness: Secondary | ICD-10-CM

## 2012-11-05 LAB — GLUCOSE, CAPILLARY

## 2012-11-05 NOTE — Progress Notes (Signed)
Subjective:     Patient ID: Meredith Maynard, female   DOB: 12/11/1965, 47 y.o.   MRN: [id number]  HPI 47 yo F presents for same day visit for persistent dizziness. She was seen in clinic on 12/27 and started on meclizine. She has take two dose of meclizine. She reports dizziness lasting a few minutes throughout the day and night. It occurs with activity and at rest. There is no feeling of room spinning.  Most recently she had dizziness after returning to the bathroom overnight. Her dizziness started after rising for kneeling and has persisted over the past 6 hrs. She admits to associated chest pains bilateral occasionally. No chest pain today. She denies feeling faint, palpitations. She has known anemia and most recently she has scant menstrual bleeding for the past 3 weeks. She also has known diabetes on oral medications. She has not checked her blood sugar recently.   Review of Systems As per HPI    Objective:   Physical Exam BP 104/72  Pulse 76  Temp 98.1 F (36.7 C) (Oral)  Ht 5\' 5"  (1.651 m)  Wt 246 lb (111.585 kg)  BMI 40.94 kg/m2  LMP 10/01/2012 Orthostatic vital signs negative.  General appearance: alert, cooperative and no distress Head: Normocephalic, without obvious abnormality, atraumatic Eyes: conjunctivae/corneas clear. PERRL, EOM's intact.  Ears: normal TM's and external ear canals both ears Heart: regular rate and rhythm, S1, S2 normal, no murmur, click, rub or gallop Neurologic: Grossly normal  Lab Results  Component Value Date   HGB 11.3* 11/05/2012    CBG (last 3)   Basename 11/05/12 0946  GLUCAP 161*      Assessment and Plan:

## 2012-11-05 NOTE — Patient Instructions (Addendum)
Ms. Meredith Maynard,  Thank you for coming in today.   Please continue meclizine for the next week.  F/u with Dr. Ashley Royalty for dizziness f/u and physical.  Dr. Armen Pickup

## 2012-11-07 NOTE — Assessment & Plan Note (Signed)
A: chronic problem from chart review since June 2013. Hgb on the rise. Fasting blood sugar elevated.  P: Continue meclizine.  F/u with PCP for DM2 f/u.  Consider adding further oral hypoglycemic.  Advised patient continue oral iron.

## 2012-11-20 ENCOUNTER — Encounter: Payer: Self-pay | Admitting: Family Medicine

## 2012-11-21 ENCOUNTER — Encounter: Payer: Self-pay | Admitting: Family Medicine

## 2012-11-21 ENCOUNTER — Ambulatory Visit (INDEPENDENT_AMBULATORY_CARE_PROVIDER_SITE_OTHER): Payer: Self-pay | Admitting: Family Medicine

## 2012-11-21 VITALS — BP 111/76 | HR 75 | Temp 98.5°F | Ht 65.0 in | Wt 248.0 lb

## 2012-11-21 DIAGNOSIS — K089 Disorder of teeth and supporting structures, unspecified: Secondary | ICD-10-CM

## 2012-11-21 DIAGNOSIS — K08409 Partial loss of teeth, unspecified cause, unspecified class: Secondary | ICD-10-CM | POA: Insufficient documentation

## 2012-11-21 DIAGNOSIS — K0889 Other specified disorders of teeth and supporting structures: Secondary | ICD-10-CM

## 2012-11-21 NOTE — Patient Instructions (Addendum)
You have a broken off tooth that needs to be removed. Please go see your dentist by the end of the week to have this removed. Let us know if you need something more than ibuprofen for pain. You can also use ice on your face around the area that hurts.   I think you irritated your throat with the hot beverage. Try sore throat lozenges which you can buy at the store.  Please come back for a diabetes visit with Dr. Ashley Royalty.   Health Maintenance Due  Topic Date Due  . Pneumococcal Polysaccharide Vaccine (#1) 10/21/1968  . Foot Exam  10/21/1976  . Ophthalmology Exam  10/21/1976  . Urine Microalbumin  10/21/1976  . Hemoglobin A1c  10/27/2012  . Pap Smear  10/21/1984

## 2012-11-21 NOTE — Assessment & Plan Note (Addendum)
Patient with broken off crowin from [place] over a month ago and tooth still not extracted.  There is some slight irritation and local blood where gums seem to be growing over tooth. Does not appear infected though there is some inflammation. Patient ok with current pain control. No antibiotics at this time. Advised extraction by end of week.  -also asked patient to follow up with Dr. Ashley Royalty for DM and health maintenance

## 2012-11-21 NOTE — Progress Notes (Signed)
Subjective:   1. Dental pain-mild-moderate in intensity but not increasing since onset. Throbbing.  Patient seen 10/15/12 and treated for ear infection and molar noted to be broken off at that time. She has been using ibuprofen since that time and pain mostly had gone away. She drank a hot beverage on Saturday and pain has recurred. Pain Seems to radiate to throat at times but she isn't sure if hot beverage just irritated her throat.Ibuprofen still helping and pain bearable. Thinks she will have money for extraction by end of the week.   ROS--Denies nausea/vomiting/fever/chills/fatigue/overall sick feelings. Denies ear pain (did try some drops from over a month ago which didn't help). Denies symptoms of high or low blood sugar.   Past Medical History-Type II diabetes, HLD, HTN.  Reviewed problem list.  Medications- reviewed and updated Chief complaint-noted  Objective: BP 111/76  Pulse 75  Temp 98.5 F (36.9 C) (Oral)  Ht 5\' 5"  (1.651 m)  Wt 248 lb (112.492 kg)  BMI 41.27 kg/m2 Gen: NAD, resting comfortably on table  HEENT: MMM, TM normal bilaterally without fluid, no sinus tenderness.  Mouth: 2nd right bottom molar. Slight edema and erythema on gums medially that are growing up over molar base. Not warm to palpation. Slightly tender to palpation but no increased warmth.  CV: RRR no mrg Lungs: CTAB  Assessment/Plan:

## 2012-12-10 ENCOUNTER — Other Ambulatory Visit: Payer: Self-pay | Admitting: Family Medicine

## 2013-01-14 ENCOUNTER — Encounter: Payer: Self-pay | Admitting: Family Medicine

## 2013-01-14 ENCOUNTER — Ambulatory Visit (INDEPENDENT_AMBULATORY_CARE_PROVIDER_SITE_OTHER): Payer: PRIVATE HEALTH INSURANCE | Admitting: Family Medicine

## 2013-01-14 VITALS — BP 125/82 | HR 81 | Temp 98.3°F | Ht 65.0 in | Wt 247.0 lb

## 2013-01-14 DIAGNOSIS — R220 Localized swelling, mass and lump, head: Secondary | ICD-10-CM

## 2013-01-14 DIAGNOSIS — I10 Essential (primary) hypertension: Secondary | ICD-10-CM

## 2013-01-14 LAB — CBC WITH DIFFERENTIAL/PLATELET
Eosinophils Absolute: 0.2 10*3/uL (ref 0.0–0.7)
Hemoglobin: 9.3 g/dL — ABNORMAL LOW (ref 12.0–15.0)
Lymphocytes Relative: 18 % (ref 12–46)
Lymphs Abs: 1.7 10*3/uL (ref 0.7–4.0)
MCH: 21 pg — ABNORMAL LOW (ref 26.0–34.0)
Monocytes Relative: 5 % (ref 3–12)
Neutrophils Relative %: 74 % (ref 43–77)
Platelets: 239 10*3/uL (ref 150–400)
RBC: 4.42 MIL/uL (ref 3.87–5.11)
WBC: 9.8 10*3/uL (ref 4.0–10.5)

## 2013-01-14 LAB — BASIC METABOLIC PANEL
BUN: 8 mg/dL (ref 6–23)
CO2: 27 mEq/L (ref 19–32)
Calcium: 9 mg/dL (ref 8.4–10.5)
Creat: 0.46 mg/dL — ABNORMAL LOW (ref 0.50–1.10)
Glucose, Bld: 189 mg/dL — ABNORMAL HIGH (ref 70–99)

## 2013-01-14 MED ORDER — AMOXICILLIN 875 MG PO TABS: 875.0000 mg | ORAL_TABLET | Freq: Three times a day (TID) | ORAL | Status: DC

## 2013-01-14 NOTE — Patient Instructions (Signed)
Please keep your appointment to have the CAT scan done - this will help [place] know how deep the infection is.  You need to schedule an appointment with your dentist to have your tooth removed.  Please get the Amoxicillin antibiotic filled today and take it three times daily.  We will call you when we see the results of the CAT scan.  Please make an appointment to be seen in 3 days, or call if you are getting worse.  If you have fevers, chills, difficulty breathing or swallowing, go to the ER.   Mantenga actualizado su cita para que el tomographia - esto nos ayudar a saber qu tan profundo es la infeccin.  Es necesario hacer una cita con su dentista para que su diente eliminado.  Por favor, obtener el antibitico amoxicilina lleno hoy y tomar tres veces al [employer]. Nos comunicaremos con usted cuando vemos los resultados de la tomografa.  Por favor, haga una cita para ver en 3 das, o llamar si usted est empeorando. Si tiene fiebre, escalofros, dificultad para respirar o tragar, vaya a la sala de emergencias.
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
## 2013-01-14 NOTE — Progress Notes (Signed)
Interpreter Cassadee Vanzandt Namihira for Dr Chamberlain 

## 2013-01-14 NOTE — Assessment & Plan Note (Signed)
Concern for abscess and/or infection.  Will schedule CT neck with contrast to eval for abscess.  AFVSS- Rx for PO amoxicillin, f/u in 3 days.  Stressed importance of taking antibiotic, getting CT scan done, and seeing dentist. Will check CBC in setting of infection, and BMET in preparation for contrasted CT scan.

## 2013-01-16 ENCOUNTER — Ambulatory Visit (HOSPITAL_COMMUNITY)
Admission: RE | Admit: 2013-01-16 | Discharge: 2013-01-16 | Disposition: A | Discharge status: Home / Self Care | Payer: PRIVATE HEALTH INSURANCE | Source: Ambulatory Visit | Attending: Family Medicine | Admitting: Family Medicine

## 2013-01-16 ENCOUNTER — Encounter (HOSPITAL_COMMUNITY): Payer: Self-pay

## 2013-01-16 DIAGNOSIS — R599 Enlarged lymph nodes, unspecified: Secondary | ICD-10-CM | POA: Insufficient documentation

## 2013-01-16 DIAGNOSIS — K047 Periapical abscess without sinus: Secondary | ICD-10-CM | POA: Insufficient documentation

## 2013-01-16 DIAGNOSIS — R221 Localized swelling, mass and lump, neck: Secondary | ICD-10-CM

## 2013-01-16 DIAGNOSIS — R220 Localized swelling, mass and lump, head: Secondary | ICD-10-CM | POA: Insufficient documentation

## 2013-01-16 MED ORDER — IOHEXOL 300 MG/ML  SOLN
75.0000 mL | Freq: Once | INTRAMUSCULAR | Status: AC | PRN
  Administered 2013-01-16: 75 mL via INTRAVENOUS

## 2013-01-18 ENCOUNTER — Ambulatory Visit: Payer: PRIVATE HEALTH INSURANCE | Admitting: Family Medicine

## 2013-01-21 ENCOUNTER — Ambulatory Visit (INDEPENDENT_AMBULATORY_CARE_PROVIDER_SITE_OTHER): Payer: PRIVATE HEALTH INSURANCE | Admitting: Family Medicine

## 2013-01-21 ENCOUNTER — Encounter: Payer: Self-pay | Admitting: Family Medicine

## 2013-01-21 VITALS — BP 111/69 | HR 83 | Ht 65.0 in | Wt 244.7 lb

## 2013-01-21 DIAGNOSIS — E119 Type 2 diabetes mellitus without complications: Secondary | ICD-10-CM

## 2013-01-21 DIAGNOSIS — J302 Other seasonal allergic rhinitis: Secondary | ICD-10-CM

## 2013-01-21 DIAGNOSIS — R42 Dizziness and giddiness: Secondary | ICD-10-CM

## 2013-01-21 DIAGNOSIS — R220 Localized swelling, mass and lump, head: Secondary | ICD-10-CM

## 2013-01-21 DIAGNOSIS — I10 Essential (primary) hypertension: Secondary | ICD-10-CM

## 2013-01-21 DIAGNOSIS — K047 Periapical abscess without sinus: Secondary | ICD-10-CM

## 2013-01-21 DIAGNOSIS — J309 Allergic rhinitis, unspecified: Secondary | ICD-10-CM

## 2013-01-21 DIAGNOSIS — D509 Iron deficiency anemia, unspecified: Secondary | ICD-10-CM

## 2013-01-21 DIAGNOSIS — R221 Localized swelling, mass and lump, neck: Secondary | ICD-10-CM

## 2013-01-21 MED ORDER — LISINOPRIL 20 MG PO TABS: 20.0000 mg | ORAL_TABLET | Freq: Every day | ORAL | Status: DC

## 2013-01-21 MED ORDER — SIMVASTATIN 80 MG PO TABS: 80.0000 mg | ORAL_TABLET | Freq: Every day | ORAL | Status: DC

## 2013-01-21 MED ORDER — FERROUS GLUCONATE 325 MG PO TABS: 325.0000 mg | ORAL_TABLET | Freq: Two times a day (BID) | ORAL | Status: DC

## 2013-01-21 MED ORDER — METFORMIN HCL 1000 MG PO TABS: 1000.0000 mg | ORAL_TABLET | Freq: Two times a day (BID) | ORAL | Status: DC

## 2013-01-21 MED ORDER — CETIRIZINE HCL 10 MG PO CAPS: 10.0000 mg | ORAL_CAPSULE | Freq: Every day | ORAL | Status: DC

## 2013-01-21 MED ORDER — AMOXICILLIN 875 MG PO TABS: 875.0000 mg | ORAL_TABLET | Freq: Three times a day (TID) | ORAL | Status: DC

## 2013-01-21 MED ORDER — VITAMIN B-12 1000 MCG PO TABS: 1000.0000 ug | ORAL_TABLET | Freq: Every day | ORAL | Status: DC

## 2013-01-21 NOTE — Assessment & Plan Note (Signed)
Advised to re-start Iron 325 mg PO BID.  New Rx sent to pharmacy.

## 2013-01-21 NOTE — Progress Notes (Signed)
  Subjective:    Patient ID: Meredith Maynard, female    DOB: 1966/04/26, 47 y.o.   MRN: [id number]  HPI:  [place] returns for follow up.  Her neck is feeling much better.  The pain is improved and the bump has gotten smaller.  She denies any fever, chills, difficulty swallowing.  She says she has had a little bit of bleeding on the inside of her mouth but still not having pain at the tooth.  A  CT scan showed inflammation but no abscess.  A CBC had a normal WBC count.    Anemia- CBC from last week: Lab Results  Component Value Date   WBC 9.8 01/14/2013   HGB 9.3* 01/14/2013   HCT 31.2* 01/14/2013   MCV 70.6* 01/14/2013   PLT 239 01/14/2013  Patient has had issues with anemia since her second pregnancy 10 years ago.  She has taken iron in the past for it.  She says that her periods are regular- ever 28 days or so, and last for 4 days.  She does report heavier bleeding on day 2 of her period, when she has to change a pad every 2 hours.  However, she has had her tubes tied and does not desire oral contraception.   Past Medical History  Diagnosis Date  . Anxiety   . Panic attacks   . Meniere syndrome   . T2DM (type 2 diabetes mellitus)   . GERD (gastroesophageal reflux disease)   . HLD (hyperlipidemia)   . Somatic complaints, multiple   . Hypertension   . Fatty liver     History  Substance Use Topics  . Smoking status: Never Smoker   . Smokeless tobacco: Never Used  . Alcohol Use: No    Family History  Problem Relation Age of Onset  . Hypertension Brother   . Stroke Brother 40  . Heart attack Brother 45     ROS Pertinent items in HPI    Objective:  Physical Exam:  BP 111/69  Pulse 83  Ht 5\' 5"  (1.651 m)  Wt 244 lb 11.2 oz (110.995 kg)  BMI 40.72 kg/m2  LMP 12/15/2012 General appearance: alert, cooperative and no distress Head: Normocephalic, without obvious abnormality, atraumatic Neck: Smaller area of induration under R mandible, now 3x3cm, no fluctuance, no skin  color changes Mouth: 2nd to last molar with crown completely chipped off, there is some white drainage when the gum line is pushed on.  Pulses: 2+ and symmetric       Assessment & Plan:

## 2013-01-21 NOTE — Assessment & Plan Note (Signed)
Infection without abscess, improving.  Rx another 4 days of amoxicillin to complete a 14 day course, f/u in one week.  Discussed to return earlier if it worsens.

## 2013-01-21 NOTE — Assessment & Plan Note (Signed)
Amoxicillin continued.  Discussed that neck infection was from tooth and that she likely has a tooth infection- needs to see Dentist ASAP, she is agreeable to this.

## 2013-01-21 NOTE — Patient Instructions (Signed)
I am glad you are feeling better- I want you to take 8 more days of antibiotics, for a total of two weeks.  I have refilled all your medications, including your iron vitamins, please take those twice a day.  Please call your dentist and make an appointment as soon as possible Please make an appointment to see Dr. Ashley Royalty in two weeks.   Me alegro de que se sienta mejor-Quiero que usted tome 8 das ms de antibiticos, por un total de [employer].  He rellenado todos los medicamentos, incluyendo las vitaminas de hierro, por favor tome los [employer] al da.  Por favor, llame a su dentista para hacer una cita tan pronto como sea posible.  Por favor, haga una cita para ver al Dr. Ashley Royalty en [facility].

## 2013-01-21 NOTE — Progress Notes (Signed)
Interpreter Shontelle Muska Namihira for Dr Chamberlain 

## 2013-01-31 ENCOUNTER — Ambulatory Visit (INDEPENDENT_AMBULATORY_CARE_PROVIDER_SITE_OTHER): Payer: PRIVATE HEALTH INSURANCE | Admitting: Family Medicine

## 2013-01-31 VITALS — BP 110/55 | HR 77 | Temp 98.7°F | Ht 65.0 in | Wt 244.0 lb

## 2013-01-31 DIAGNOSIS — I10 Essential (primary) hypertension: Secondary | ICD-10-CM

## 2013-01-31 DIAGNOSIS — R221 Localized swelling, mass and lump, neck: Secondary | ICD-10-CM

## 2013-01-31 MED ORDER — CLINDAMYCIN HCL 300 MG PO CAPS: 300.0000 mg | ORAL_CAPSULE | Freq: Three times a day (TID) | ORAL | Status: DC

## 2013-01-31 MED ORDER — AMOXICILLIN 875 MG PO TABS: 875.0000 mg | ORAL_TABLET | Freq: Three times a day (TID) | ORAL | Status: DC

## 2013-01-31 MED ORDER — FERROUS GLUCONATE 325 MG PO TABS: 325.0000 mg | ORAL_TABLET | Freq: Two times a day (BID) | ORAL | Status: DC

## 2013-01-31 NOTE — Assessment & Plan Note (Signed)
Patient has been taking amoxicillin regularly. Ashby Dawes was interpreter today and saw her when she initially presented to care and states she is greatly improved. However still has some swelling. Not concern for any current abscess.  Had negative CT scan about 1 week ago. She has never been treated appropriately for anaerobic coverage. Therefore I'm adding clindamycin to her treatment regimen today. I provided her enough medications to get her to her appointment next Thursday. The pain is fairly tolerable and controlled with over-the-counter analgesics.

## 2013-01-31 NOTE — Patient Instructions (Signed)
Tome el Amoxicillin y el Clindamycin juntos tres veces al dia.  Tome el iron (ferrous gluconate) dos veces al dia.    Buene suerte con su dientes

## 2013-01-31 NOTE — Progress Notes (Signed)
Interpreter Waqas Bruhl Namihira for Dr Walden 

## 2013-01-31 NOTE — Progress Notes (Signed)
Subjective:    Meredith Maynard is a 47 y.o. female who presents to [facility] today with complaints of tooth swelling and facial pain:  1.  Tooth swelling and facial pain:  Started earlier this month about 2 weeks ago. She has been seen here 3 times in the past several weeks. She has been started on amoxicillin which greatly reduced the swelling of her face and has improved her pain. However she still has swelling in her right mandible. She occasionally feels tired at night, more so than before. Also feels warm at night. No overt fevers or chills. No nausea or vomiting.  Pain is worse when she eats or chews. She denies any actual pain on the tooth itself does have pain when she opens her mouth wide. She is not on amoxicillin. She has an appointment to have her tooth removed next Thursday. She does not know what to do until her appointment.  The following portions of the patient's history were reviewed and updated as appropriate: allergies, current medications, past medical history, family and social history, and problem list. Patient is a nonsmoker.    PMH reviewed.  Past Medical History  Diagnosis Date  . Anxiety   . Panic attacks   . Meniere syndrome   . T2DM (type 2 diabetes mellitus)   . GERD (gastroesophageal reflux disease)   . HLD (hyperlipidemia)   . Somatic complaints, multiple   . Hypertension   . Fatty liver    Past Surgical History  Procedure Laterality Date  . Umbilical hernia repair  2007    Dr. Lindie Spruce  . Small intestine surgery  2007    Dr. Lindie Spruce  . Cesarean section  2000,2004,2005    x3  . Exercise treadmill test  2010    Normal, poor exercise tolerance  . Tubal ligation    . Abdominal u/s  2010    Other than fatty liver, normal    Medications reviewed. Current Outpatient Prescriptions  Medication Sig Dispense Refill  . amoxicillin (AMOXIL) 875 MG tablet Take 1 tablet (875 mg total) by mouth 3 (three) times daily.  12 tablet  0  . aspirin EC 81 MG tablet Take 81  mg by mouth daily.      . Blood Glucose Monitoring Suppl (ACCU-CHEK NANO SMARTVIEW) W/DEVICE KIT 1 Device by Does not apply route once.  1 kit  0  . Cetirizine HCl 10 MG CAPS Take 1 capsule (10 mg total) by mouth daily.  30 capsule  3  . Elastic Bandages & Supports (MEDICAL LEGWEAR/KNEE HIGH) MISC Please provide knee high compression stockings.  20-97mmhg  1 each  0  . ferrous gluconate (FERGON) 325 MG tablet Take 1 tablet (325 mg total) by mouth 2 (two) times daily.  60 tablet  11  . glucose blood (ACCU-CHEK SMARTVIEW) test strip Use as instructed  100 each  12  . ibuprofen (ADVIL,MOTRIN) 200 MG tablet Take 600 mg by mouth every 6 (six) hours as needed. For pain      . Lancets (ACCU-CHEK MULTICLIX) lancets Use as instructed  100 each  12  . lisinopril (PRINIVIL,ZESTRIL) 20 MG tablet Take 1 tablet (20 mg total) by mouth daily.  90 tablet  2  . meclizine (ANTIVERT) 50 MG tablet Take 1 tablet (50 mg total) by mouth 3 (three) times daily as needed.  30 tablet  0  . metFORMIN (GLUCOPHAGE) 1000 MG tablet Take 1 tablet (1,000 mg total) by mouth 2 (two) times daily with a meal.  60  tablet  11  . simvastatin (ZOCOR) 80 MG tablet Take 1 tablet (80 mg total) by mouth at bedtime.  31 tablet  3  . vitamin B-12 (CYANOCOBALAMIN) 1000 MCG tablet Take 1 tablet (1,000 mcg total) by mouth daily.  30 tablet  11   No current facility-administered medications for this visit.    ROS as above otherwise neg.  No chest pain, palpitations, SOB, Fever, Chills, Abd pain, N/V/D.   Objective:   Physical Exam BP 110/55  Pulse 77  Temp(Src) 98.7 F (37.1 C) (Oral)  Ht 5\' 5"  (1.651 m)  Wt 244 lb (110.678 kg)  BMI 40.6 kg/m2  LMP 12/15/2012 Gen:  Patient sitting on exam table, appears stated age in no acute distress Head: Normocephalic atraumatic Eyes: EOMI, PERRL, sclera and conjunctiva non-erythematous Ears:  Canals clear bilaterally.  TMs pearly gray bilaterally without erythema or bulging.   Nose:  Nares patent  BL Mouth: Mucosa membranes moist. Tonsils +2, nonenlarged, non-erythematous.  Broken first molar noted right lower mandible. Nontender to tapping. No drainage noted. Patient does have edema noted right lower mandible and a 4 x 5 area of induration lower jaw. Otherwise no lymphadenopathy. No submandibular adenopathy or swelling no overlying erythema. Minimal tenderness.  Neck: No cervical lymphadenopathy noted Heart:  RRR, no murmurs auscultated. Pulm:  Clear to auscultation bilaterally with good air movement.  No wheezes or rales noted.      No results found for this or any previous visit (from the past 72 hour(s)).

## 2013-02-04 ENCOUNTER — Ambulatory Visit: Payer: PRIVATE HEALTH INSURANCE

## 2013-02-18 ENCOUNTER — Ambulatory Visit (INDEPENDENT_AMBULATORY_CARE_PROVIDER_SITE_OTHER): Payer: PRIVATE HEALTH INSURANCE | Admitting: Family Medicine

## 2013-02-18 ENCOUNTER — Encounter: Payer: Self-pay | Admitting: Family Medicine

## 2013-02-18 VITALS — BP 108/70 | HR 69 | Temp 98.1°F | Resp 18 | Wt 250.0 lb

## 2013-02-18 DIAGNOSIS — K08109 Complete loss of teeth, unspecified cause, unspecified class: Secondary | ICD-10-CM

## 2013-02-18 DIAGNOSIS — K08409 Partial loss of teeth, unspecified cause, unspecified class: Secondary | ICD-10-CM

## 2013-02-18 DIAGNOSIS — I10 Essential (primary) hypertension: Secondary | ICD-10-CM

## 2013-02-18 NOTE — Progress Notes (Signed)
  Subjective:    Patient ID: Meredith Maynard, female    DOB: [birth year], 47 y.o.   MRN: [id number]  HPI Dental abscess - she had the infected molar pulled 9 days ago and has been off antibiotics about 6 days. The pain has resolved and the swelling is decreasing, but she intermittently has drainage and foul taste from the socket. Hasn't followed up with the dentist. No fever or chills   Review of Systems     Objective:   Physical Exam  HENT:  Still mildly swollen right mid mandible without warmth or tenderness. The socket where her right second molar was extracted had a 0.5 cm diameter piece of slightly firm material was easily removed from the socket. No purulence or bleeding. Some white tissue was seen at the base of the socket.   Lymphadenopathy:    She has no cervical adenopathy.          Assessment & Plan:

## 2013-02-18 NOTE — Assessment & Plan Note (Signed)
well controlled  

## 2013-02-18 NOTE — Progress Notes (Signed)
Interpreter Eldon Zietlow [facility] for [facility] 

## 2013-02-18 NOTE — Patient Instructions (Addendum)
Yo remove un pedazo de algo. Si no continua mejorando o empieza con dolor o queda con mal sabor, debe regresar a Press photographer.   Please return to your dentist if the tooth socket doesn't heal after I removed the material from the socket.

## 2013-02-18 NOTE — Assessment & Plan Note (Signed)
Infection appears resolved. The white tissue (cartilage? Food substance?) may have been the source of the bad taste. There's no pain to suggest dry socket.

## 2013-02-28 ENCOUNTER — Ambulatory Visit (INDEPENDENT_AMBULATORY_CARE_PROVIDER_SITE_OTHER): Payer: PRIVATE HEALTH INSURANCE | Admitting: Family Medicine

## 2013-02-28 ENCOUNTER — Encounter: Payer: Self-pay | Admitting: Family Medicine

## 2013-02-28 VITALS — BP 102/68 | HR 82 | Ht 65.0 in | Wt 245.0 lb

## 2013-02-28 DIAGNOSIS — R51 Headache: Secondary | ICD-10-CM

## 2013-02-28 NOTE — Progress Notes (Addendum)
Subjective:   # Headache epigastric pain burning for several hours Sunday which subsequently resolved then got left sided headache. Patient states treated for GERD previously but current pain was not associated with meals or spicy foods. Headahce Moved over to right side since that time. 5/10 pain at worst better with ibuprofen or ice. Has occasionally had headaches like this in the past. Some warmth in the belly but no fever or chills. No neck stiffness. Was worried that infection from tooth went to brain. Patient also has had some URI symptoms in this time (cough, congestion, runny nose). No fever chills. Came on gradually after stomach pains. Occasionally having some epigastric burning which is better if she eats something.   # Dental issues Patient noted some whitish material in her tooth socket where she recently had a molar removed. No dental pain. No fever/chills as above. No warmth or redness around the spot. Had some white stuff removed here in office a few weeks ago on 4/21. About to finish amoxicillin course from dentist. No longer on clindamycin. Patient not cleaning out socket. Has not been back to dentist.   ROS--See HPI  Past Medical History-diabetes type II. Nonsmoker.  Reviewed problem list.  Medications- reviewed and updated Chief complaint-noted  Objective: BP 102/68  Pulse 82  Ht 5\' 5"  (1.651 m)  Wt 245 lb (111.131 kg)  BMI 40.77 kg/m2  LMP 02/21/2013 Gen: NAD, resting comfortably HEENT: back right molar s/p removal. Socket with food material removed.  CV: RRR no murmurs rubs or gallops Lungs: CTAB no crackles, wheeze, rhonchi Skin: warm, dry Neuro: CN II-XII intact, sensation and reflexes normal throughout, 5/5 muscle strength in bilateral upper and lower extremities. Normal finger to nose. Normal rapid alternating movements.  MSK: no neck stiffness or meningeal signs  Assessment/Plan:  Headache-patient with some URI symptoms and stomach upset over last 5 days with  associated headache. Only mildly bothersome to patient but was primarily worried about tooth infection spreading to brain. No focal neuro deficits and no meningeal signs. Highly doubt CNS infection. LIkely just HA related to URI as this has happened to patient in past.   Dental issues-no pain to speak of. Patient had food material stuck in tooth which I removed and told patient to follow up with dentist.   Stressed to patient that the most important thing from today is that she needs to follow up with PCP for diabetes care. Patient with history of GERD as well but with stomach upset assc with URI and only for 5 days now, did not prescribe antireflux medicine. Patient will need further follow up/workup for this if does not resolve after URI symptoms resolve.

## 2013-02-28 NOTE — Progress Notes (Signed)
Interpreter Kathleena Freeman Namihira for Dr Hunter 

## 2013-02-28 NOTE — Patient Instructions (Signed)
Make an appointment for your diabetes within 2 weeks. This is the most important thing we discussed today.   1. Your tooth just had food particles in it. You need to follow up with the dentist. 2. I think you had a viral illness upsetting your stomach and getting you congested and you had a headache associated with this. Please follow up in a week if your headache doesn't resolve with tylenol or ibuprofen.   Thanks, Dr. Durene Cal  Health Maintenance Due  Topic Date Due  . Pneumococcal Polysaccharide Vaccine (#1) 10/21/1968  . Foot Exam  10/21/1976  . Ophthalmology Exam  10/21/1976  . Urine Microalbumin  10/21/1976  . Influenza Vaccine  07/01/2013  . Tetanus/tdap  10/31/2014

## 2013-03-18 ENCOUNTER — Encounter: Payer: Self-pay | Admitting: Family Medicine

## 2013-03-18 ENCOUNTER — Ambulatory Visit (INDEPENDENT_AMBULATORY_CARE_PROVIDER_SITE_OTHER): Payer: PRIVATE HEALTH INSURANCE | Admitting: Family Medicine

## 2013-03-18 ENCOUNTER — Other Ambulatory Visit: Payer: PRIVATE HEALTH INSURANCE

## 2013-03-18 VITALS — BP 108/76 | HR 70 | Ht 65.0 in | Wt 239.0 lb

## 2013-03-18 DIAGNOSIS — E119 Type 2 diabetes mellitus without complications: Secondary | ICD-10-CM

## 2013-03-18 NOTE — Progress Notes (Signed)
A1C AND D-LDL DONE TODAY Meredith Maynard

## 2013-03-18 NOTE — Progress Notes (Signed)
Patient ID: Meredith Maynard, female   DOB: [birth year], 47 y.o.   MRN: [id number] Patient arrived late for appointment and then received phone call that she needed to pick up her son.  She was unable to stay for appointment.  She had her labs drawn and will follow up with me at a later date.

## 2013-04-19 ENCOUNTER — Encounter: Payer: Self-pay | Admitting: Family Medicine

## 2013-04-20 ENCOUNTER — Other Ambulatory Visit: Payer: Self-pay | Admitting: Family Medicine

## 2013-04-22 ENCOUNTER — Other Ambulatory Visit: Payer: Self-pay | Admitting: *Deleted

## 2013-04-22 DIAGNOSIS — E119 Type 2 diabetes mellitus without complications: Secondary | ICD-10-CM

## 2013-04-22 MED ORDER — FERROUS GLUCONATE 325 MG PO TABS: 325.0000 mg | ORAL_TABLET | Freq: Two times a day (BID) | ORAL | Status: DC

## 2013-04-22 MED ORDER — SIMVASTATIN 80 MG PO TABS: 80.0000 mg | ORAL_TABLET | Freq: Every day | ORAL | Status: DC

## 2013-04-24 ENCOUNTER — Encounter: Payer: Self-pay | Admitting: Family Medicine

## 2013-04-24 ENCOUNTER — Ambulatory Visit (INDEPENDENT_AMBULATORY_CARE_PROVIDER_SITE_OTHER): Payer: PRIVATE HEALTH INSURANCE | Admitting: Family Medicine

## 2013-04-24 VITALS — BP 114/70 | Ht 65.0 in | Wt 242.0 lb

## 2013-04-24 DIAGNOSIS — I10 Essential (primary) hypertension: Secondary | ICD-10-CM

## 2013-04-24 DIAGNOSIS — F411 Generalized anxiety disorder: Secondary | ICD-10-CM

## 2013-04-24 DIAGNOSIS — Z0000 Encounter for general adult medical examination without abnormal findings: Secondary | ICD-10-CM

## 2013-04-24 DIAGNOSIS — R5381 Other malaise: Secondary | ICD-10-CM

## 2013-04-24 DIAGNOSIS — M6530 Trigger finger, unspecified finger: Secondary | ICD-10-CM

## 2013-04-24 DIAGNOSIS — R5383 Other fatigue: Secondary | ICD-10-CM

## 2013-04-24 DIAGNOSIS — D509 Iron deficiency anemia, unspecified: Secondary | ICD-10-CM

## 2013-04-24 DIAGNOSIS — E119 Type 2 diabetes mellitus without complications: Secondary | ICD-10-CM

## 2013-04-24 HISTORY — DX: Other fatigue: R53.83

## 2013-04-24 NOTE — Patient Instructions (Addendum)
Thank you for coming in today, it was good to see you Have your mammogram done They will call you to set up your sleep study Follow up in 2-3 months  Trigger Finger Trigger finger (digital tendinitis and stenosing tenosynovitis) is a common disorder that causes an often painful catching of the fingers or thumb. It occurs as a clicking, snapping or locking of a finger in the palm of the hand. The reason for this is that there is a problem with the tendons which flex the fingers sliding smoothly through their sheaths. The cause of this may be inflammation of the tendon and sheath, or from a thickening or nodule in the tendon. The condition may occur in any finger or a couple fingers at the same time. The cause may be overuse while doing the same activity over and over again with your hands.  Tendons are the tough cords that connect the muscles to bones. Muscles and tendons are part of the system which allows your body to move. When muscles contract in the forearm on the palm side, they pull the tendons toward the elbow and cause the fingers and thumb to bend (flex) toward the palm. These are the flexor tendons. The tendons slide through a slippery smooth membrane (synovium) which is called the tendon sheath. The sheaths have areas of tough fibrous tissues surrounding them which hold the tendons close to the bone. These are called pulleys because they work like a pulley. The first pulley is in the palm of the hand near the crease which runs across your palm. If the area of the tendon thickening is near the pulley, the tendon cannot slide smoothly through the pulley and this causes the trigger finger. The finger may lock with the finger curled or suddenly straighten out with a snap. This is more common in patients with rheumatoid arthritis and diabetes. Left untreated, the condition may get worse to the point where the finger becomes locked in flexion, like making a fist, or less commonly locked with the finger  straightened out. DIAGNOSIS  Your caregiver will easily make this diagnosis on examination. TREATMENT   Splinting for 6 to 8 weeks of time may be helpful. Use the splints as your caregiver suggests.  Heat used for twenty minutes at least four times a day followed by ice packs for twenty minutes unless directed otherwise by your caregiver may be helpful. If you find either heat or cold seems to be making the problem worse, quit using them and ask your caregiver for directions.  Cortisone injections along with splinting may speed up recovery. Several injections may be required. Cortisone may give relief after one injection.  Only take over-the-counter or prescription medicines for pain, discomfort, or fever as directed by your caregiver.  Surgery is another treatment that may be used if conservative treatments using injection and splinting does not work. Surgery can be minor without incisions (a cut does not have to be made) and can be done with a needle through the skin. No stitches are needed and most patients may return to work the same day.  Other surgical choices involve an open procedure where the surgeon opens the hand through a small incision (cut) and cuts the pulley so the tendon can again slide smoothly. Your hand will still work fine. This small operation requires stitches and the recovery will be a little longer and the incisions will need to be protected until completely healed. You may have to limit your activities for up to  6 months.  Occupational or hand therapy may be required if there is stiffness remaining in the finger. RISKS AND COMPLICATIONS Complications are uncommon but some problems that may occur are:  Recurrence of the trigger finger. This does not mean that the surgery was not well done. It simply means that you may have formed scar tissue following surgery that causes the problem to reoccur.  Infection which could ruin the results of the surgery and can result in a  finger which is frozen and can not move normally.  Nerve injury is possible which could result in permanent numbness of one or more fingers. CARE AFTER SURGERY  Elevate your hand above your heart and use ice as instructed.  Follow instructions regarding finger motion/exercise.  Keep the surgical wound dry for at least 48 hrs or longer if instructed.  Keep your follow-up appointments.  Return to work and normal activities as instructed. SEEK IMMEDIATE MEDICAL CARE IF:  Your problems are getting worse or you do not obtain relief from the treatment. Document Released: 08/06/2004 Document Revised: 01/09/2012 Document Reviewed: 03/31/2009 [facility] Patient Information 2014 [place], [place].

## 2013-04-25 DIAGNOSIS — M6530 Trigger finger, unspecified finger: Secondary | ICD-10-CM

## 2013-04-25 DIAGNOSIS — Z0000 Encounter for general adult medical examination without abnormal findings: Secondary | ICD-10-CM | POA: Insufficient documentation

## 2013-04-25 HISTORY — DX: Trigger finger, unspecified finger: M65.30

## 2013-04-25 LAB — CBC
Hemoglobin: 9 g/dL — ABNORMAL LOW (ref 12.0–15.0)
MCH: 21.2 pg — ABNORMAL LOW (ref 26.0–34.0)
MCHC: 30.6 g/dL (ref 30.0–36.0)
Platelets: 186 10*3/uL (ref 150–400)
RDW: 18 % — ABNORMAL HIGH (ref 11.5–15.5)
WBC: 7.5 10*3/uL (ref 4.0–10.5)

## 2013-04-25 NOTE — Assessment & Plan Note (Signed)
Situational anxiety regarding time constraints at her job.  May have some compulsions with thread eating when her anxiety is worse.  This could also represent pica with her history of Fe deficient anemia.  Will check another cbc and send in refill for iron supplementation.  Discussed if she continues to have this can discuss referral to psychology/counselor or addtion of medication.

## 2013-04-25 NOTE — Assessment & Plan Note (Signed)
Lab Results  Component Value Date   HGBA1C 7.5 03/18/2013  Last A1c shows that diabetes is fairly well controlled.  She is moving more and trying to lose weight.  Will not add additional medication at this time.

## 2013-04-25 NOTE — Assessment & Plan Note (Signed)
BP well controlled continue current medications 

## 2013-04-25 NOTE — Assessment & Plan Note (Signed)
Symptoms and exam consistent with trigger finger.  Discussed treatment options including injection.  She elects conservative tx as it does not happen that often.

## 2013-04-25 NOTE — Assessment & Plan Note (Signed)
Reminded to schedule mammogram

## 2013-04-25 NOTE — Progress Notes (Signed)
  Subjective:    Patient ID: Meredith Maynard, female    DOB: May 01, 1966, 47 y.o.   MRN: [id number]  HPI Visit conducted in spanish with use of spanish language interpreter  1. Diabetes:  Reports that her sugars have been fairly well controlled, although she can not recall to me exact numbers.  She is compliant with metformin and tolerating well.  She has increased her activity and reports weight loss of ~10lbs.  She denies symptoms of hypoglycemia.  She is due for an eye exam and foot exam.   2. Finger problem:  Reports that the middle finger of her L hand sometimes gets stuck and she will have to force the finger back open which is painful.  She has noticed a small knot in the palm of her hand as well.  She denies fever, swelling of joints.    3. HTN:  Reports that she is compliant with medications and tolerating them well.  She has not missed any doses.  She denies any dizziness, lightheadedness, chest pain, shortness of breath, headache, palpitations.    4. Anxiety:  Reports that she has increased anxiety at work regarding having to get things done in a timely manner.  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  She reports that she will pick at threads on her clothes and chew on them and often eat them.  She does not have any desire to eat other non-food objects.  She denies symptoms of panic including shortness of breath, chest pain.   Past Medical History  Diagnosis Date  . Anxiety   . Panic attacks   . Meniere syndrome   . T2DM (type 2 diabetes mellitus)   . GERD (gastroesophageal reflux disease)   . HLD (hyperlipidemia)   . Somatic complaints, multiple   . Hypertension   . Fatty liver    Smoking history reviewed, no history of tobacco use  Review of Systems Per HPI    Objective:   Physical Exam  Constitutional:  Obese female, nad   HENT:  Head: Normocephalic and atraumatic.  Mouth/Throat:  Oropharynx is clear and moist.  Neck: Neck supple.  Cardiovascular: Normal rate and regular rhythm.   Pulmonary/Chest: Effort normal and breath sounds normal. No respiratory distress. She has no wheezes.  Abdominal: Bowel sounds are normal. She exhibits no distension. There is no tenderness.  Musculoskeletal: She exhibits no edema.  Small knot along palmar aspect of L hand around MCP joint, non-tender  Neurological: She is alert.  Psychiatric: She has a normal mood and affect. Her behavior is normal.          Assessment & Plan:

## 2013-04-29 ENCOUNTER — Telehealth: Payer: Self-pay | Admitting: *Deleted

## 2013-04-29 NOTE — Telephone Encounter (Signed)
Message copied by Radene Ou on Mon Apr 29, 2013  9:03 AM ------      Message from: Everrett Coombe      Created: Sun Apr 28, 2013  3:10 PM       Please let patient know: Hgb still low but stable at 9.0.  Sent in refill for iron supplement.  Needs to return in 3 months. ------

## 2013-04-29 NOTE — Telephone Encounter (Signed)
Attempted to call patient.  She is Spanish speaking and did not understand me. Will fwd this message to [employer] to please call patient.  Arieanna Pressey, Darlyne Russian, CMA

## 2013-04-29 NOTE — Telephone Encounter (Signed)
I called pt and LVM hopefully pt will call us back. I will try to call pt again late.   [employer]

## 2013-04-30 ENCOUNTER — Other Ambulatory Visit: Payer: Self-pay | Admitting: Family Medicine

## 2013-04-30 DIAGNOSIS — E119 Type 2 diabetes mellitus without complications: Secondary | ICD-10-CM

## 2013-04-30 NOTE — Telephone Encounter (Signed)
I notified pt that her iron supplement, was sent into the pharmacy.  i did not mention anything about ASA.  Lillybeth Tal, Darlyne Russian, CMA

## 2013-04-30 NOTE — Telephone Encounter (Signed)
Hello,  I spoke with pt about hemoglobin result but ,pt mention something about aspirin. When the nurse called her she did understand something about aspirin? And also pt wants to know the status about her sleep study appt.  Thank You

## 2013-05-01 ENCOUNTER — Telehealth: Payer: Self-pay | Admitting: Family Medicine

## 2013-05-01 MED ORDER — ASPIRIN EC 81 MG PO TBEC: 81.0000 mg | DELAYED_RELEASE_TABLET | Freq: Every day | ORAL | Status: DC

## 2013-05-01 MED ORDER — SIMVASTATIN 80 MG PO TABS: 80.0000 mg | ORAL_TABLET | Freq: Every day | ORAL | Status: DC

## 2013-05-01 NOTE — Telephone Encounter (Signed)
Pt should take one 81 mg aspirin daily. I have sent an Rx in case that is what she needed but OTC is fine as well.   Kevin Fenton 05/01/2013 9:05 AM

## 2013-05-01 NOTE — Telephone Encounter (Signed)
Pt is aware about Rx (aspirin)  Marines

## 2013-05-09 ENCOUNTER — Other Ambulatory Visit: Payer: Self-pay

## 2013-06-07 ENCOUNTER — Ambulatory Visit (HOSPITAL_BASED_OUTPATIENT_CLINIC_OR_DEPARTMENT_OTHER): Payer: PRIVATE HEALTH INSURANCE | Attending: Family Medicine | Admitting: Radiology

## 2013-06-07 VITALS — Ht 65.0 in | Wt 246.0 lb

## 2013-06-07 DIAGNOSIS — R635 Abnormal weight gain: Secondary | ICD-10-CM | POA: Insufficient documentation

## 2013-06-07 DIAGNOSIS — R5383 Other fatigue: Secondary | ICD-10-CM

## 2013-06-07 DIAGNOSIS — R5381 Other malaise: Secondary | ICD-10-CM | POA: Insufficient documentation

## 2013-06-07 DIAGNOSIS — R0602 Shortness of breath: Secondary | ICD-10-CM | POA: Insufficient documentation

## 2013-06-07 DIAGNOSIS — G4700 Insomnia, unspecified: Secondary | ICD-10-CM | POA: Insufficient documentation

## 2013-06-07 DIAGNOSIS — G4733 Obstructive sleep apnea (adult) (pediatric): Secondary | ICD-10-CM

## 2013-06-07 DIAGNOSIS — G4710 Hypersomnia, unspecified: Secondary | ICD-10-CM | POA: Insufficient documentation

## 2013-06-07 DIAGNOSIS — R61 Generalized hyperhidrosis: Secondary | ICD-10-CM | POA: Insufficient documentation

## 2013-06-08 DIAGNOSIS — R5381 Other malaise: Secondary | ICD-10-CM

## 2013-06-08 DIAGNOSIS — R5383 Other fatigue: Secondary | ICD-10-CM

## 2013-06-09 NOTE — Procedures (Signed)
NAME:  Meredith Maynard, Meredith Maynard          ACCOUNT NO.:  [id number]  MEDICAL RECORD NO.:  [id number]          PATIENT TYPE:  OUT  LOCATION:  SLEEP CENTER                 FACILITY:  [facility]  PHYSICIAN:  Clinton D. Maple Hudson, MD, FCCP, FACPDATE OF BIRTH:  04-20-1966  DATE OF STUDY:  06/07/2013                           NOCTURNAL POLYSOMNOGRAM  REFERRING PHYSICIAN:  RACHEL CHAMBERLAIN  REFERRING DOCTOR:  Everrett Coombe, MD  INDICATIONS FOR STUDY:  Hypersomnia with sleep apnea.  EPWORTH SLEEPINESS SCORE:  12/24.  BMI 40.9, weight 246 pounds, height 65 inches, neck 17 inches.  MEDICATIONS:  Home medications are charted and reviewed.  SLEEP ARCHITECTURE:  Total sleep time 277.5 minutes with sleep efficiency 69.6%.  Stage I was 10.6%, stage II 71.9%, stage III absent. REM 17.5% of total sleep time.  Sleep latency 43.5 minutes, REM latency 222 minutes.  Awake after sleep onset 75.5 minutes.  Arousal index 22.1. Bedtime medications:  None.  RESPIRATORY DATA:  Apnea-hypopnea index (AHI) 3.7 per hour.  A total of 17 events were scored including 2 obstructive apneas, 1 central apnea, 14 hypopneas.  Most events were associated with supine sleep position or REM.  REM AHI 3.7 per hour.  There were not enough events for split protocol CPAP titration.  OXYGEN DATA:  Moderate snoring with oxygen desaturation to a nadir of 89% and mean oxygen saturation through the study of 97.8% on room air.  CARDIAC DATA:  Normal sinus rhythm.  MOVEMENT/PARASOMNIA:  A total of 152 limb jerks were counted of which 32 were associated with arousals or awakening for periodic limb movement with arousal index of 6.9 per hour.  Bathroom x1.  IMPRESSION/RECOMMENDATION: 1. Sleep architecture was significant for fragmentation with     difficulty maintaining sleep until shortly after 11 p.m.  She was     then spontaneously awake shortly after midnight until almost 1:00     a.m.  No bedtime medication. 2. Occasional respiratory  event with sleep disturbance, within normal     limits.  AHI 3.7 per hour (the normal AHI for adults is from 0-5     events per hour).  Moderate snoring with oxygen desaturation to a     nadir of 89% and mean oxygen saturation through the study of 97.8%     on room air. 3. Periodic limb movement with arousal syndrome.  A total of 152 limb     jerks were counted, of which 32 were associated with arousal or     awakening for periodic limb movement with arousal index of 6.9 per     hour.  This may contribute to unrefreshing sleep and a perception     of frequent wakefulness.  Specific therapy with Klonopin, Requip,     or Mirapex might be considered if appropriate.  Anxiety may also     contribute.     Clinton D. Maple Hudson, MD, Tonny Bollman, FACP Diplomate, American Board of Sleep Medicine    CDY/MEDQ  D:  06/08/2013 12:28:56  T:  06/08/2013 14:11:35  Job:  [id number]

## 2013-06-27 ENCOUNTER — Other Ambulatory Visit: Payer: Self-pay | Admitting: *Deleted

## 2013-06-27 DIAGNOSIS — R42 Dizziness and giddiness: Secondary | ICD-10-CM

## 2013-06-27 DIAGNOSIS — I10 Essential (primary) hypertension: Secondary | ICD-10-CM

## 2013-06-27 MED ORDER — METFORMIN HCL 1000 MG PO TABS: 1000.0000 mg | ORAL_TABLET | Freq: Two times a day (BID) | ORAL | Status: DC

## 2013-07-02 ENCOUNTER — Other Ambulatory Visit: Payer: Self-pay | Admitting: *Deleted

## 2013-07-02 DIAGNOSIS — I10 Essential (primary) hypertension: Secondary | ICD-10-CM

## 2013-07-02 DIAGNOSIS — R42 Dizziness and giddiness: Secondary | ICD-10-CM

## 2013-07-02 MED ORDER — METFORMIN HCL 1000 MG PO TABS
1000.0000 mg | ORAL_TABLET | Freq: Two times a day (BID) | ORAL | Status: DC
Start: 2013-07-02 — End: 2013-11-19

## 2013-07-31 ENCOUNTER — Encounter: Payer: Self-pay | Admitting: Family Medicine

## 2013-07-31 ENCOUNTER — Ambulatory Visit (INDEPENDENT_AMBULATORY_CARE_PROVIDER_SITE_OTHER): Payer: Self-pay | Admitting: Family Medicine

## 2013-07-31 VITALS — BP 117/72 | HR 82 | Temp 98.6°F | Ht 65.0 in | Wt 247.0 lb

## 2013-07-31 DIAGNOSIS — M549 Dorsalgia, unspecified: Secondary | ICD-10-CM

## 2013-07-31 MED ORDER — CYCLOBENZAPRINE HCL 5 MG PO TABS: 5.0000 mg | ORAL_TABLET | Freq: Three times a day (TID) | ORAL | Status: DC | PRN

## 2013-07-31 NOTE — Patient Instructions (Addendum)
Front office: Please make a follow-up appointment with Dr. Ermalinda Memos in two weeks. If possible, pt can also be seen in [facility].  Thank you for coming in, today!  I think you have a spasm in your back muscles. I will prescribe a medicine called Flexeril for muscle spasm. You can take one pill before bedtime to start. After a day or two, you can start taking it up to three times per day. You can also take ibuprofen 600 mg up to three times per day for pain.  Come back in about two weeks to see Dr. Ermalinda Memos. If you need to come back sooner, make an appointment earlier than that.  Please feel free to call with any questions or concerns at any time, at [phone]. --Dr. Marchelle Folks por venir, hoy!   Yo creo que hay un espasmo en los msculos de la espalda.  Voy a recetar un medicamento llamado Flexeril para el espasmo muscular .  Usted puede tomar una pastilla antes de acostarse para Corporate investment banker.  Despus de Civil engineer, contracting [address], usted puede comenzar a tomarla hasta tres veces por Futures trader .  Tambin puede tomar ibuprofeno 600 mg hasta tres veces al da para Chief Technology Officer .   Vuelve dentro de [employer] para ver el doctor [place] .  Si tiene que volver antes, Radio producer una cita antes que eso.   Por favor, sintase libre de llamar con cualquier pregunta o preocupacin que en cualquier momento , en [phone]

## 2013-08-01 NOTE — Assessment & Plan Note (Signed)
Very likely MSK in nature. On chart review, pt has had similar complaints in the past and supposedly has leg-length discrepancy noted previously by Dr. Terrilee Files; regardless, current pain is likely multifactorial, related to weight, work, prior injury, body habitus, etc. Rx for Flexeril with instructions to start with one half tablet at night, to slowly increase up to 5 mg TID as tolerated. Counseled on sleepiness, etc. Also recommended ibuprofen 600 mg q8. Reviewed red flags such as loss of sensation, strength, bowel/bladder control, etc. F/u in about 2 weeks. Could consider imaging and/or PT, etc, if pain does not improve.  Of note, pt has had no follow up with her new PCP, Dr. Ermalinda Memos, yet, and has several long-term medical problems such as DM, HTN, HLD, iron deficiency, etc. Strongly advised pt to f/u with Dr. Ermalinda Memos for both her back pain and her other medical issues. Pt voiced understanding but did also request to be seen if possible in [facility]; I'm not sure how feasible this will be now that Dr. Sheffield Slider is no longer here. Will defer further management to PCP.

## 2013-08-01 NOTE — Progress Notes (Signed)
  Subjective:    Patient ID: Meredith Maynard, female    DOB: 15-Oct-1966, 47 y.o.   MRN: [id number]  HPI: Pt presents to clinic for SDA complaining of 5 days of left-sided back pain. Phone interpretation services utilized (pt does not speak English and scheduled Spanish interpretor did not arrive for pt's appt). Pt describes Sharp pain in her back, "a little above her waistline" that "moves up higher on her back and down toward her bottom." Pt also endorses headache, dry mouth, and mild subjective fever (did not measure temp at home), all associated with back pain. Pain is not worse with specific movements, but does increase with lying down or changing positions. Pt has been drinking more water and taking Tylenol, without much relief. Pt denies weakness/numbness in her lower extremities, change in bowel/bladder habits, abdominal pain, chest pain, N/V. Pt does endorse some shortness of breath this morning about 3 AM, lasting about 1.5 hours, while lying in bed, secondary to how severe the pain was. Pt denies recent injury or change in activity; see note below.  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  Review of Systems: As above. Generally has no other complaints. Does endorse noting a "spot" on her back, of darkened skin; she noticed it "about three years ago" and her "last doctor" told her it was "maybe an allergy." She thinks this is unrelated to her back pain.     Objective:   Physical Exam BP 117/72  Pulse 82  Temp(Src) 98.6 F (37 C) (Oral)  Ht 5\' 5"  (1.651 m)  Wt 247 lb (112.038 kg)  BMI 41.1 kg/m2 Gen: well-appearing adult female in NAD, very pleasant HEENT: EOMI, MMM, oropharynx and nasal  mucosae clear Cardio: RRR, no murmur appreciated Pulm: CTAB, no wheezes, normal WOB Abd: soft, obese, nontender, BS+ MSK: no point tenderness over thoracic or lumbar spines, though notable diffuse muscle spasm in left paraspinal muscles mid-thoracic and lower  Pain increased with change in position, but pt with generally normal ROM  Pain increased markedly with lying flat for abdominal exam  Negative sitting straight-leg lift test, no tenderness over bony prominences of hips, knees, or ankles bilaterally Neuro: moves all extremities equally, str 5/5 in all four extremities; pt able to sit/stand/walk normally without assistance Skin: indistinct, mildly hyperpigmented area of skin to right of upper back around bra-line, not raised/indurated or tender  No other rash or obvious suspicious lesions, skin otherwise warm, dry, intact     Assessment & Plan:

## 2013-08-14 ENCOUNTER — Encounter: Payer: Self-pay | Admitting: Family Medicine

## 2013-08-14 ENCOUNTER — Ambulatory Visit (INDEPENDENT_AMBULATORY_CARE_PROVIDER_SITE_OTHER): Payer: Self-pay | Admitting: Family Medicine

## 2013-08-14 VITALS — BP 105/70 | HR 80 | Temp 98.2°F | Wt 247.9 lb

## 2013-08-14 DIAGNOSIS — E669 Obesity, unspecified: Secondary | ICD-10-CM

## 2013-08-14 DIAGNOSIS — M7918 Myalgia, other site: Secondary | ICD-10-CM

## 2013-08-14 DIAGNOSIS — K429 Umbilical hernia without obstruction or gangrene: Secondary | ICD-10-CM

## 2013-08-14 DIAGNOSIS — IMO0001 Reserved for inherently not codable concepts without codable children: Secondary | ICD-10-CM

## 2013-08-14 DIAGNOSIS — E119 Type 2 diabetes mellitus without complications: Secondary | ICD-10-CM

## 2013-08-14 LAB — POCT GLYCOSYLATED HEMOGLOBIN (HGB A1C): Hemoglobin A1C: 7.6

## 2013-08-14 MED ORDER — GLIPIZIDE 5 MG PO TABS: 5.0000 mg | ORAL_TABLET | Freq: Two times a day (BID) | ORAL | Status: DC

## 2013-08-14 NOTE — Assessment & Plan Note (Signed)
Would qualify for bariatric surgery but cannot afford it Discussed decreasing carb intake Offered appt with nutritionist which she states she will make appt with.

## 2013-08-14 NOTE — Assessment & Plan Note (Signed)
In multiple places Discussed with patient the possibility that her statin is responsible and her timeline seems consistent Will take a break from simvastatin and follow for improvement.

## 2013-08-14 NOTE — Patient Instructions (Addendum)
It was great to meet you today!  Make an appointment to get your orange card.  Make an appointment to see our dietician for weight loss and diabetes .   Come back in 2 weeks  Obstruccin Del Intestino Delgado (Small Bowel Obstruction)  Una obstruccin en el intestino delgado es un bloqueo (obstruccin) presente en el intestino delgado. El intestino delgado es un tubo largo que da muchas vueltas y Investment banker, operational el estmago con el colon. Su funcin es absorber Museum/gallery conservator) los lquidos y alimentos y transportarlos al torrente sanguneo. CAUSAS Hay numerosas causas que originan el bloqueo. Las causas ms frecuentes son:  Hernias. Esta es una causa ms frecuente en nios que en adultos.  Enfermedad inflamatoria del intestino (enteritis y colitis).  Vlvulo (retorcimiento del intestino).  Tumores.  Tejido cicatrizal (adherencias) de [place] o terapia de radiacin previa.  Ciruga reciente. Esto puede causar una obstruccin aguda en el intestino delgado llamada leo. SNTOMAS  Dolor abdominal. Pueden ser calambres sordos o dolor agudo. Puede ocurrir en una zona o puede estar presente en todo el abdomen. El dolor puede variar de leve a intenso, dependiendo del grado de la obstruccin.  Nuseas y vmitos. El vmito puede ser verdoso o de color amarillo por la bilis.  Estmago distendido o hinchado. Un sntoma comn es la distensin abdominal.  Constipacin  Ausencia de gases.  Eructos frecuentes.  Diarrea. Esto puede ocurrir si heces lquidas pueden filtrarse por la obstruccin. DIAGNSTICO El profesional realizar el diagnstico de obstruccin a travs de la historia [place], el examen fsico y la toma de radiografas. Si la causa no es evidente, puede ser necesaria una tomografa computada de su abdomen y pelvis. TRATAMIENTO El tratamiento depende de la causa y de la gravedad del problema.   En algunos casos la obstruccin mejora simplemente con reposo en cama y lquidos por  va intravenosa.  Es muy importante que relaje el intestino. Esto significa que siga una dieta simple. En ocasiones, puede ser necesario que lleve una dieta de lquidos claros [facility].  Generalmente se coloca un pequeo tubo (tubo nasogstrico) dentro del estmago para descomprimir el intestino. Cuando el intestino se bloquea, se hincha como un globo lleno de aire y lquidos. Descompresin significa que el aire y los lquidos se eliminan con la succin que realiza el tubo. Esto puede ayudar a Glass blower/designer, Environmental health practitioner y la nusea. Tambin puede ayudar a que la obstruccin se resuelva ms rpido.  Es posible que sea necesario realizar una ciruga si los otros tratamientos no funcionan. La obstruccin intestinal causada por una hernia requiere de ciruga cuanto antes y puede llegar a ser un procedimiento de Associate Professor. Aquellas adherencias que sean causa de obstrucciones frecuentes o graves tambin pueden requerir [place]. INSTRUCCIONES PARA EL CUIDADO DOMICILIARIO Si su obstruccin es slo parcial o incompleta, es posible que se le permita regresar a Hotel manager.   Debe hacer reposo.  Siga su dieta segn las indicaciones de su mdico.  Seguir una dieta de lquidos claros hasta que el trastorno mejore.  Evite los alimentos slidos segn las indicaciones. SOLICITE ATENCIN MDICA DE INMEDIATO SI:  Dolor o calambres abdominales.  Vomita sangre.  Tiene vmitos que no puede controlar o nusea.  No puede beber lquidos debido a los vmitos o al [employer]  Confusin.  Deshidratacin (siente la piel seca o tiene mucha sed).  Observa gran hinchazn.  Tiene escalofros.  Tiene fiebre.  Le sube la fiebre, siente debilidad extrema o se desmaya. EST SEGURO  QUE:  Comprende las instrucciones para el alta mdica.  Controlar su enfermedad.  Solicitar atencin mdica de inmediato segn las indicaciones. Document Released: 01/24/2008 Document Revised: 01/09/2012 [facility]  Patient Information 2014 [place], [place].

## 2013-08-14 NOTE — Progress Notes (Signed)
  Subjective:    Patient ID: Meredith Maynard, female    DOB: [birth year], 47 y.o.   MRN: [id number]  HPI Pt here for abd pain and msuculoskeltal pain Visit performed with spanish interpreter  States that she has had a previous incarcerated hernia and emergent surgery for that 9 years ago. 3 days ago she had worsening abdominal pain and had to miss work. She states that it is similar to many other times when she has been told it was not an emergency. She has been tolerating food and still having bowel movements. SHe describes her pain as central abdominal/peri umbilical without radiation, and severe.  She has been offered another surgery for her hernia but cannot afford it.   She has had multiple musculoskeltal pains for 4+ years, she states that she understands that these are chronic and likely will not be fixed. She has started using an OTC cream since last visit which has helped significantly. She wonders if stopping her statin will help her pains.   Dm2 Takes metformin daily, watches her carb intake and walks twice weakly.  Does not check her CBGs regularly, only when she thinks she is out of control which is characterized by nervousness andincrease in urination. Then CBGs are 150-200, max of 230.   Really want sto lose weight and states that she has lost approx 10 lbs on her own.    Review of Systems Denies fevers, chills, sweats, chest pain, dyspnea.     Objective:   Physical Exam  Gen: NAD, alert, cooperative with exam HEENT: NCAT, EOMI, PERRL CV: RRR, good S1/S2, no murmur Resp: CTABL, no wheezes, non-labored Abd: Soft, well healed umbilical scar. With large soft area c/w a hernia without a palpable defect, no tenderness to palpation.  Ext: No edema, warm Neuro: Alert and oriented, No gross deficits     Assessment & Plan:

## 2013-08-14 NOTE — Assessment & Plan Note (Signed)
Not strangulated or incarcerated Discussed that surgery may be helpful but she states sh ecannot afford it She is pursuing the orange card Discussed reasons to seek care, will monitor.

## 2013-08-14 NOTE — Assessment & Plan Note (Signed)
Not at goal Discuss lifestyle change vs inc meds, she will try both Continue metformin Added glipizide 5 mg BID Also send to nutritionist for DM education and advice for weight loss.

## 2013-11-19 ENCOUNTER — Emergency Department (HOSPITAL_COMMUNITY)
Admission: EM | Admit: 2013-11-19 | Discharge: 2013-11-19 | Disposition: A | Discharge status: Home / Self Care | Payer: Self-pay | Attending: Emergency Medicine | Admitting: Emergency Medicine

## 2013-11-19 ENCOUNTER — Emergency Department (HOSPITAL_COMMUNITY): Payer: Self-pay

## 2013-11-19 ENCOUNTER — Encounter (HOSPITAL_COMMUNITY): Payer: Self-pay | Admitting: Emergency Medicine

## 2013-11-19 DIAGNOSIS — I10 Essential (primary) hypertension: Secondary | ICD-10-CM | POA: Insufficient documentation

## 2013-11-19 DIAGNOSIS — Z7982 Long term (current) use of aspirin: Secondary | ICD-10-CM | POA: Insufficient documentation

## 2013-11-19 DIAGNOSIS — Z91040 Latex allergy status: Secondary | ICD-10-CM | POA: Insufficient documentation

## 2013-11-19 DIAGNOSIS — Z3202 Encounter for pregnancy test, result negative: Secondary | ICD-10-CM | POA: Insufficient documentation

## 2013-11-19 DIAGNOSIS — Z9851 Tubal ligation status: Secondary | ICD-10-CM | POA: Insufficient documentation

## 2013-11-19 DIAGNOSIS — Z9889 Other specified postprocedural states: Secondary | ICD-10-CM | POA: Insufficient documentation

## 2013-11-19 DIAGNOSIS — K2990 Gastroduodenitis, unspecified, without bleeding: Secondary | ICD-10-CM

## 2013-11-19 DIAGNOSIS — K2970 Gastritis, unspecified, without bleeding: Secondary | ICD-10-CM | POA: Insufficient documentation

## 2013-11-19 DIAGNOSIS — K439 Ventral hernia without obstruction or gangrene: Secondary | ICD-10-CM | POA: Insufficient documentation

## 2013-11-19 DIAGNOSIS — Z79899 Other long term (current) drug therapy: Secondary | ICD-10-CM | POA: Insufficient documentation

## 2013-11-19 LAB — CBC WITH DIFFERENTIAL/PLATELET
Basophils Absolute: 0.1 10*3/uL (ref 0.0–0.1)
Basophils Relative: 0 % (ref 0–1)
EOS PCT: 0 % (ref 0–5)
Eosinophils Absolute: 0.1 10*3/uL (ref 0.0–0.7)
HCT: 32.5 % — ABNORMAL LOW (ref 36.0–46.0)
Hemoglobin: 10.1 g/dL — ABNORMAL LOW (ref 12.0–15.0)
Lymphocytes Relative: 9 % — ABNORMAL LOW (ref 12–46)
Lymphs Abs: 1.3 10*3/uL (ref 0.7–4.0)
MCH: 22.4 pg — ABNORMAL LOW (ref 26.0–34.0)
MCHC: 31.1 g/dL (ref 30.0–36.0)
MCV: 72.2 fL — AB (ref 78.0–100.0)
Monocytes Absolute: 0.5 10*3/uL (ref 0.1–1.0)
Monocytes Relative: 4 % (ref 3–12)
NEUTROS ABS: 12.3 10*3/uL — AB (ref 1.7–7.7)
Neutrophils Relative %: 87 % — ABNORMAL HIGH (ref 43–77)
PLATELETS: 207 10*3/uL (ref 150–400)
RBC: 4.5 MIL/uL (ref 3.87–5.11)
RDW: 16.8 % — ABNORMAL HIGH (ref 11.5–15.5)
WBC: 14.2 10*3/uL — ABNORMAL HIGH (ref 4.0–10.5)

## 2013-11-19 LAB — URINALYSIS, ROUTINE W REFLEX MICROSCOPIC
Bilirubin Urine: NEGATIVE
GLUCOSE, UA: 500 mg/dL — AB
HGB URINE DIPSTICK: NEGATIVE
Ketones, ur: 15 mg/dL — AB
Leukocytes, UA: NEGATIVE
Nitrite: NEGATIVE
Protein, ur: NEGATIVE mg/dL
SPECIFIC GRAVITY, URINE: 1.02 (ref 1.005–1.030)
Urobilinogen, UA: 0.2 mg/dL (ref 0.0–1.0)
pH: 6 (ref 5.0–8.0)

## 2013-11-19 LAB — COMPREHENSIVE METABOLIC PANEL
ALT: 29 U/L (ref 0–35)
AST: 20 U/L (ref 0–37)
Albumin: 3.6 g/dL (ref 3.5–5.2)
Alkaline Phosphatase: 96 U/L (ref 39–117)
BUN: 7 mg/dL (ref 6–23)
CO2: 27 meq/L (ref 19–32)
Calcium: 9 mg/dL (ref 8.4–10.5)
Chloride: 103 mEq/L (ref 96–112)
Creatinine, Ser: 0.6 mg/dL (ref 0.50–1.10)
GFR calc Af Amer: >90 mL/min (ref >90)
Glucose, Bld: 246 mg/dL — ABNORMAL HIGH (ref 70–99)
Potassium: 3.6 mEq/L — ABNORMAL LOW (ref 3.7–5.3)
Sodium: 142 mEq/L (ref 137–147)
TOTAL PROTEIN: 7 g/dL (ref 6.0–8.3)
Total Bilirubin: 0.3 mg/dL (ref 0.3–1.2)

## 2013-11-19 LAB — LIPASE, BLOOD: Lipase: 26 U/L (ref 11–59)

## 2013-11-19 LAB — POCT PREGNANCY, URINE: Preg Test, Ur: NEGATIVE

## 2013-11-19 LAB — CG4 I-STAT (LACTIC ACID): LACTIC ACID, VENOUS: 1.62 mmol/L (ref 0.5–2.2)

## 2013-11-19 MED ORDER — HYDROMORPHONE HCL PF 1 MG/ML IJ SOLN
1.0000 mg | Freq: Once | INTRAMUSCULAR | Status: AC
  Administered 2013-11-19: 1 mg via INTRAVENOUS
  Filled 2013-11-19: qty 1

## 2013-11-19 MED ORDER — IOHEXOL 300 MG/ML  SOLN
25.0000 mL | Freq: Once | INTRAMUSCULAR | Status: AC | PRN
  Administered 2013-11-19: 25 mL via ORAL

## 2013-11-19 MED ORDER — GI COCKTAIL ~~LOC~~
30.0000 mL | Freq: Once | ORAL | Status: AC
  Administered 2013-11-19: 30 mL via ORAL
  Filled 2013-11-19: qty 30

## 2013-11-19 MED ORDER — FAMOTIDINE 20 MG PO TABS
20.0000 mg | ORAL_TABLET | Freq: Once | ORAL | Status: AC
  Administered 2013-11-19: 20 mg via ORAL
  Filled 2013-11-19 (×2): qty 1

## 2013-11-19 MED ORDER — FENTANYL CITRATE 0.05 MG/ML IJ SOLN
50.0000 ug | Freq: Once | INTRAMUSCULAR | Status: AC
  Administered 2013-11-19: 50 ug via INTRAVENOUS
  Filled 2013-11-19: qty 2

## 2013-11-19 MED ORDER — ONDANSETRON 4 MG PO TBDP: ORAL_TABLET | ORAL | Status: DC

## 2013-11-19 MED ORDER — IOHEXOL 300 MG/ML  SOLN
100.0000 mL | Freq: Once | INTRAMUSCULAR | Status: AC | PRN
  Administered 2013-11-19: 100 mL via INTRAVENOUS

## 2013-11-19 MED ORDER — ONDANSETRON HCL 4 MG/2ML IJ SOLN
4.0000 mg | Freq: Once | INTRAMUSCULAR | Status: AC
  Administered 2013-11-19: 4 mg via INTRAVENOUS
  Filled 2013-11-19: qty 2

## 2013-11-19 MED ORDER — FAMOTIDINE 20 MG PO TABS: 20.0000 mg | ORAL_TABLET | Freq: Two times a day (BID) | ORAL | Status: DC

## 2013-11-19 NOTE — ED Notes (Signed)
Pt ambulatory to restroom for urine sample.

## 2013-11-19 NOTE — ED Notes (Signed)
MD at bedside. 

## 2013-11-19 NOTE — ED Notes (Signed)
PT Family reports Pt has ABD pain and vomiting from eating bad food.

## 2013-11-19 NOTE — Discharge Instructions (Signed)
If you were given medicines take as directed.  If you are on coumadin or contraceptives realize their levels and effectiveness is altered by many different medicines.  If you have any reaction (rash, tongues swelling, other) to the medicines stop taking and see a physician.   Please follow up as directed and return to the ER or see a physician for new or worsening symptoms (persistent pain, uncontrolled vomiting, fevers, other).  Thank you. See your surgeon to discuss treatment options.    Hernia (Hernia) Una hernia ocurre cuando un rgano interno protruye a travs de un punto dbil de los msculos de la pared muscular abdominal (del vientre). Se producen con mayor frecuencia en la ingle y alrededor del ombligo. Generalmente puede volver a Educational psychologist (reducirse). La mayor parte de las hernias tienden a Copy con Physiological scientist. Algunas hernias abdominales pueden atascarse en la abertura (hernias irreductibles o hernia encarcelada) y no pueden reducirse. Una hernia abdominal irreducible que est ligeramente apretada en la abertura, corre el riesgo de daar el flujo de sangre (hernia estrangulada). Una hernia estrangulada es una emergencia mdica. Debido al riesgo que se corre en caso de hernia irreducible o estrangulada, se recomienda la ciruga para repararla. CAUSAS  Levantar peso excesivo.  Mucha tos.  Tensin al ir de cuerpo.  Durante la ciruga abdominal se realiza un corte (incisin). INSTRUCCIONES PARA EL CUIDADO DOMICILIARIO  No es necesario hacer reposo en cama. Puede continuar con sus actividades habituales.  Evite levantar peso (ms de 10 libras o 4,5 Kg) o hacer esfuerzos.  Tos con suavidad. Si actualmente usted fuma, es el momento de abandonar el hbito. Hasta el procedimiento quirrgico ms perfecto puede malograrse si se hace fuerza contnua para toser. Aunque su hernia no haya sido reparada, la tos puede agravar el problema.  No use nada apretado sobre la hernia. No  trate de mantenerla adentro con un vendaje externo o un braguero. Puede lesionar el contenido abdominal si los aprieta dentro del saco de la hernia.  Consumir una dieta normal.  Evite la constipacin. Si hace mucha fuerza aumentar el tamao de la hernia y podr daarse la reparacin. Si no lo logra slo con la dieta, puede usar laxantes. SOLICITE ATENCIN MDICA DE INMEDIATO SI:  Tiene fiebre.  Presenta un dolor abdominal cada vez ms intenso.  Si tiene Higher education careers adviser (nuseas) y vmitos.  La hernia se ha atascado fuera del abdomen, se ve descolorida, se siente dura o le duele.  Observa cambios en el hbito intestinal o en la hernia, lo que no es habitual en usted.  El dolor o la hinchazn alrededor de la hernia [place].  No puede volver a Public affairs consultant hernia en su lugar ejerciendo una presin suave mientras se encuentra recostado. EST SEGURO QUE:   Comprende las instrucciones para el alta mdica.  Controlar su enfermedad.  Solicitar atencin mdica de inmediato segn las indicaciones. Document Released: 10/17/2005 Document Revised: 01/09/2012 [facility] Patient Information 2014 [place], [place].

## 2013-11-19 NOTE — ED Provider Notes (Signed)
CSN: [id number]     Arrival date & time 11/19/13  0045 History   First MD Initiated Contact with Patient 11/19/13 0147     Chief Complaint  Patient presents with  . Abdominal Pain  . Emesis   (Consider location/radiation/quality/duration/timing/severity/associated sxs/prior Treatment) HPI Comments: 48 yo female with DM, lipids, HTN, ventral hernia repair hx and recurrent large ventral hernia being followed outpt by general surgery presents with central/ epi pain and vomiting since eating dinner with onions.  No fevers.  Pt has had hernia for some time now.  Non radiating. Severe cramp/ ache.  Intermittent.  More severe than previous.    Patient is a 48 y.o. female presenting with abdominal pain and vomiting. The history is provided by the patient and a relative.  Abdominal Pain Associated symptoms: vomiting   Associated symptoms: no chest pain, no chills, no diarrhea, no dysuria, no fever and no shortness of breath   Emesis Associated symptoms: abdominal pain   Associated symptoms: no chills, no diarrhea and no headaches     Past Medical History  Diagnosis Date  . Anxiety   . Panic attacks   . Meniere syndrome   . T2DM (type 2 diabetes mellitus)   . GERD (gastroesophageal reflux disease)   . HLD (hyperlipidemia)   . Somatic complaints, multiple   . Hypertension   . Fatty liver    Past Surgical History  Procedure Laterality Date  . Umbilical hernia repair  2007    Dr. Lindie Spruce  . Small intestine surgery  2007    Dr. Lindie Spruce  . Cesarean section  2000,2004,2005    x3  . Exercise treadmill test  2010    Normal, poor exercise tolerance  . Tubal ligation    . Abdominal u/s  2010    Other than fatty liver, normal   Family History  Problem Relation Age of Onset  . Hypertension Brother   . Stroke Brother 40  . Heart attack Brother 45   History  Substance Use Topics  . Smoking status: Never Smoker   . Smokeless tobacco: Never Used  . Alcohol Use: No   OB History   Grav  Para Term Preterm Abortions TAB SAB Ect Mult Living                 Review of Systems  Constitutional: Positive for appetite change. Negative for fever and chills.  HENT: Negative for congestion.   Respiratory: Negative for shortness of breath.   Cardiovascular: Negative for chest pain.  Gastrointestinal: Positive for vomiting and abdominal pain. Negative for diarrhea and blood in stool.  Genitourinary: Negative for dysuria and flank pain.  Musculoskeletal: Negative for neck pain and neck stiffness.  Skin: Negative for rash.  Neurological: Negative for light-headedness and headaches.    Allergies  Latex  Home Medications   Current Outpatient Rx  Name  Route  Sig  Dispense  Refill  . aspirin EC 81 MG tablet   Oral   Take 1 tablet (81 mg total) by mouth daily.   30 tablet   11   . ferrous sulfate 325 (65 FE) MG EC tablet   Oral   Take 325 mg by mouth daily.         Marland Kitchen ibuprofen (ADVIL,MOTRIN) 200 MG tablet   Oral   Take 400 mg by mouth every 6 (six) hours as needed for moderate pain. For pain         . lisinopril (PRINIVIL,ZESTRIL) 20 MG tablet  Oral   Take 20 mg by mouth daily.         . metFORMIN (GLUCOPHAGE) 1000 MG tablet   Oral   Take 1,000 mg by mouth 2 (two) times daily with a meal.         . Multiple Vitamins-Minerals (MULTIVITAMIN WITH MINERALS) tablet   Oral   Take 1 tablet by mouth daily.         . famotidine (PEPCID) 20 MG tablet   Oral   Take 1 tablet (20 mg total) by mouth 2 (two) times daily.   10 tablet   0   . ondansetron (ZOFRAN ODT) 4 MG disintegrating tablet      4mg  ODT q4 hours prn nausea/vomit   8 tablet   0    BP 119/64  Pulse 85  Temp(Src) 98.3 F (36.8 C) (Oral)  Resp 20  Ht 5\' 6"  (1.676 m)  Wt 251 lb 1 oz (113.881 kg)  BMI 40.54 kg/m2  SpO2 95% Physical Exam  Nursing note and vitals reviewed. Constitutional: She is oriented to person, place, and time. She appears well-developed and well-nourished.  HENT:   Head: Normocephalic and atraumatic.  Mild dry mm  Eyes: Conjunctivae are normal. Right eye exhibits no discharge. Left eye exhibits no discharge.  Neck: Normal range of motion. Neck supple. No tracheal deviation present.  Cardiovascular: Normal rate and regular rhythm.   Pulmonary/Chest: Effort normal and breath sounds normal.  Abdominal: Soft. She exhibits no distension. There is tenderness (obese, central and epig tenderness, large ventral hernia, soft/ reduceable). There is no guarding.  Musculoskeletal: She exhibits no edema.  Neurological: She is alert and oriented to person, place, and time.  Skin: Skin is warm. No rash noted.  Psychiatric: She has a normal mood and affect.    ED Course  Procedures (including critical care time) Labs Review Labs Reviewed  CBC WITH DIFFERENTIAL - Abnormal; Notable for the following:    WBC 14.2 (*)    Hemoglobin 10.1 (*)    HCT 32.5 (*)    MCV 72.2 (*)    MCH 22.4 (*)    RDW 16.8 (*)    Neutrophils Relative % 87 (*)    Neutro Abs 12.3 (*)    Lymphocytes Relative 9 (*)    All other components within normal limits  COMPREHENSIVE METABOLIC PANEL - Abnormal; Notable for the following:    Potassium 3.6 (*)    Glucose, Bld 246 (*)    All other components within normal limits  URINALYSIS, ROUTINE W REFLEX MICROSCOPIC - Abnormal; Notable for the following:    APPearance CLOUDY (*)    Glucose, UA 500 (*)    Ketones, ur 15 (*)    All other components within normal limits  LIPASE, BLOOD  POCT PREGNANCY, URINE  CG4 I-STAT (LACTIC ACID)   Imaging Review Ct Abdomen Pelvis W Contrast  11/19/2013   CLINICAL DATA:  Abdominal pain and vomiting.  EXAM: CT ABDOMEN AND PELVIS WITH CONTRAST  TECHNIQUE: Multidetector CT imaging of the abdomen and pelvis was performed using the standard protocol following bolus administration of intravenous contrast.  CONTRAST:  176mL OMNIPAQUE IOHEXOL 300 MG/ML SOLN, 80mL OMNIPAQUE IOHEXOL 300 MG/ML SOLN  COMPARISON:  CT  of the abdomen and pelvis performed 11/17/2009  FINDINGS: The visualized lung bases are clear.  The liver and spleen are unremarkable in appearance. The gallbladder is within normal limits. The pancreas and adrenal glands are unremarkable.  The kidneys are unremarkable in appearance. There is  no evidence of hydronephrosis. No renal or ureteral stones are seen. No perinephric stranding is appreciated.  A large anterior abdominal wall hernia is noted about the umbilicus, containing much of the ileum. There is associated fecalization to the level of a bowel suture line at the mid to distal ileum, and gradual decompression of the distal ileum, compatible with underlying small bowel dysmotility. No focal transition point is seen, though the more proximal loops are partially filled with fluid, and a small amount of free fluid is seen tracking about small bowel loops within the hernia, with mild mesenteric edema. The stomach is within normal limits. No acute vascular abnormalities are seen.  The appendix is normal in caliber, without evidence for appendicitis. The colon is unremarkable in appearance.  The bladder is relatively decompressed and grossly unremarkable. The uterus is within normal limits. The patient is status post tubal ligation. The ovaries are grossly symmetric; no suspicious adnexal masses are seen. No inguinal lymphadenopathy is seen.  No acute osseous abnormalities are identified.  IMPRESSION: 1. Large anterior abdominal wall hernia noted about the umbilicus, containing much of the ileum. There is fecalization of the ileum to the level of a bowel suture line at the mid to distal ileum, and gradual decompression of the distal ileum, compatible with underlying small bowel dysmotility. More proximal small bowel loops are partially filled with fluid, with small amount of free fluid tracking about small bowel loops in the hernia, and mild mesenteric edema. However, no focal transition point is seen to suggest  bowel obstruction. 2. Otherwise unremarkable CT of the abdomen and pelvis.   Electronically Signed   By: Garald Balding M.D.   On: 11/19/2013 04:11    EKG Interpretation   None       MDM   1. Ventral hernia   2. Gastritis    Translated by daughter.  Worsening pain since eating, likely gastritis/ gerd however large hernia, wbc elevated and difficulty controlling Pain.  CT abd done, no acute findings, known hernia and small bowel dysmotility on results.  Lactate normal. Narcotics given for pain. Fluids given.  Pt improved in ED and has fup with her surgeon. GI cocktail and pepcid given.  Results and differential diagnosis were discussed with the patient. Close follow up outpatient was discussed, patient comfortable with the plan.         Mariea Clonts, MD 11/19/13 [phone]

## 2013-11-19 NOTE — ED Notes (Addendum)
Pt sitting on side of bed, upset, c/o abd pain, "worse after CT scan", denies nausea, daughter at [facility] translating sx, feelings and needs, pt requesting "something to help with pain, also ice chips". Pt alert, NAD, calm. Pending CT results.

## 2013-11-19 NOTE — ED Notes (Signed)
Patient transported to CT 

## 2013-11-27 ENCOUNTER — Ambulatory Visit (INDEPENDENT_AMBULATORY_CARE_PROVIDER_SITE_OTHER): Payer: Self-pay | Admitting: Family Medicine

## 2013-11-27 ENCOUNTER — Encounter: Payer: Self-pay | Admitting: Family Medicine

## 2013-11-27 VITALS — BP 120/80 | HR 85 | Temp 97.9°F | Resp 18 | Wt 243.0 lb

## 2013-11-27 DIAGNOSIS — K429 Umbilical hernia without obstruction or gangrene: Secondary | ICD-10-CM

## 2013-11-27 DIAGNOSIS — K2990 Gastroduodenitis, unspecified, without bleeding: Principal | ICD-10-CM

## 2013-11-27 DIAGNOSIS — K2970 Gastritis, unspecified, without bleeding: Secondary | ICD-10-CM

## 2013-11-27 DIAGNOSIS — E785 Hyperlipidemia, unspecified: Secondary | ICD-10-CM

## 2013-11-27 DIAGNOSIS — R1013 Epigastric pain: Secondary | ICD-10-CM

## 2013-11-27 DIAGNOSIS — Z23 Encounter for immunization: Secondary | ICD-10-CM

## 2013-11-27 LAB — POCT H PYLORI SCREEN: H PYLORI SCREEN, POC: NEGATIVE

## 2013-11-27 MED ORDER — OMEPRAZOLE 20 MG PO CPDR: 20.0000 mg | DELAYED_RELEASE_CAPSULE | Freq: Every day | ORAL | Status: DC

## 2013-11-27 NOTE — Progress Notes (Signed)
Patient ID: Meredith Maynard    DOB: 02/03/1966, 48 y.o.   MRN: [id number] --- Subjective:  Meredith Maynard is a 48 y.o.female with h/o DM, hyperlipidemia, HTN, 2 ventral hernia repairs and recurrent hernia who presents for follow up on abdominal pain. She was seen in the ED on 11/19/13 for abdominal pain and emesis. Her pain at the time of evaluation was epigastric, worst with eating. She did not have any pain around her hernia. She had a CT abdomen that showed large anterior abdominal wall hernia about the umbilicus, containing much of the ileum. There was otherwise no evidence of small bowel obstruction.  She was prescribed pepcid which she has been taking and which has helped. She reports that the pain feels like a colicky pain, that then becomes a severe stabbing pain that radiates to the back. Associated with emesis, non bloody. Feels better after emesis. Worst with eating spicy foods and milk. Last episode was yesterday when she drank milk. She states that pain is worst when she has her period.  Denies any melena, any feeling of food being stuck, any trouble swallowing.  She was diagnosed with gastritis 3 years ago and had been placed on a medicine that had helped. She got better and did not require treatment for the last year until 3 months ago when pain became worst.   Spanish Interpreter was present during the entire visit.   ROS: see HPI Past Medical History: reviewed and updated medications and allergies. Social History: Tobacco: none  Objective: Filed Vitals:   11/27/13 0918  BP: 120/80  Pulse: 85  Temp: 97.9 F (36.6 C)  Resp: 18    Physical Examination:   General appearance - alert, well appearing, and in no distress Chest - clear to auscultation, no wheezes, rales or rhonchi, symmetric air entry Heart - normal rate, regular rhythm, normal S1, S2, no murmurs Abdomen - large ventral hernia, non tender, normal color, reduceable, tenderness to palpation along epigastric area, no  right upper quadrant tenderness, negative Murphy's.  Extremities - no pedal edema

## 2013-11-27 NOTE — Patient Instructions (Signed)
Follow up in 2 weeks with Dr. Wendi Snipes  Gastritis - Adultos  (Gastritis, Adult)  La gastrittis es la irritacin (inflamacin) de la membrana interna del estmago. Puede ser Ardelia Mems enfermedad de inicio sbito (aguda) o de largo plazo (crnica). Si la gastritis no se trata, puede causar sangrado y lceras. CAUSAS  La gastritis se produce cuando la membrana que tapiza interiormente al estmago se debilita o se daa. Los jugos digestivos del estmago inflaman el revestimiento del estmago debilitado. El revestimiento del estmago puede debilitarse o daarse por una infeccin viral o bacteriana. La infeccin bacteriana ms comn es la infeccin por Helicobacter pylori. Tambin puede ser el resultado del consumo excesivo de alcohol, por el uso de ciertos medicamentos o porque hay demasiado cido en el estmago.  SNTOMAS  En algunos casos no hay sntomas. Si se presentan sntomas, stos pueden ser:   Dolor o sensacin de ardor en la parte superior del abdomen.  Nuseas.  Vmitos.  Sensacin molesta de distensin despus de comer. DIAGNSTICO  El mdico puede diagnosticar gastritis segn los sntomas y el examen fsico. Para determinar la causa de la gastritis, el mdico podr:   Pedir anlisis de sangre o de materia fecal para diagnosticar la presencia de la bacteria H pylori.  Gastroscopa. Un tubo delgado y flexible (endoscopio) se pasa por Industrial/product designer al [employer]. El endoscopio tiene [place] luz y una cmara en el extremo. El mdico utilizar el endoscopio para observar el interior del [place].  Tomar una muestra de tejido (biopsia) del estmago para examinarlo en el microscopio. TRATAMIENTO  Segn la causa de la gastritis podrn recetarle: Antibiticos, si la causa es una infeccin bacteriana, como una infeccin por H. pylori. Anticidos o bloqueadores H2, si hay demasiado cido en el estmago. El [employer] aconsejar que deje de tomar aspirina, ibuprofeno u otros antiinflamatorios no  esteroides (AINE).  INSTRUCCIONES PARA EL CUIDADO EN EL HOGAR   Tome slo medicamentos de venta libre o recetados, segn las indicaciones del mdico.  Si le han recetado antibiticos, tmelos segn las indicaciones. Tmelos todos, aunque se sienta mejor.  Debe ingerir gran cantidad de lquido para mantener la orina de tono claro o color amarillo plido.  Evite las comidas y bebidas que empeoran los [place], [place]:  [place] con cafena o alcohlicas.  Chocolate.  Sabores a English as a second language teacher.  Ajo y cebolla.  Comidas muy condimentadas.  Ctricos como naranjas, limones o limas.  Alimentos que contengan tomate, como salsas, [place] y pizza.  Alimentos fritos y Radio broadcast assistant.  Haga comidas pequeas durante Psychiatrist de 3 comidas abundantes. SOLICITE ATENCIN MDICA DE INMEDIATO SI:   La materia fecal es negra o de color rojo oscuro.  Vomita sangre de color rojo brillante o material similar a granos de caf.  No puede retener los lquidos.  El dolor abdominal empeora.  Tiene fiebre.  No mejora luego de 1 semana.  Tiene preguntas o preocupaciones. ASEGRESE DE QUE:   Comprende estas instrucciones.  Controlar su enfermedad.  Solicitar ayuda de inmediato si no mejora o si empeora. Document Released: 07/27/2005 Document Revised: 07/11/2012 [facility] Patient Information 2014 [place], [place].

## 2013-11-28 DIAGNOSIS — R1013 Epigastric pain: Secondary | ICD-10-CM | POA: Insufficient documentation

## 2013-11-28 NOTE — Assessment & Plan Note (Signed)
Patient reports being concerned that she not currently on statin therapy. Advised for her to follow up on this with PCP.

## 2013-11-28 NOTE — Assessment & Plan Note (Signed)
Stable. [facility] cannot afford [place] [facility] here in [place].

## 2013-11-28 NOTE — Assessment & Plan Note (Signed)
Etiology: gastritis vs PUD. H pylori checked and negative. Doesn't seem related to hernia. No red flags: no melena, no significant weight loss within the last 7 months, no difficulty swallowing, which is reassuring.  - empirically treat with prilosec 20mg  daily. Gave coupon for goodrx.com for more affordable price - follow up with PCP

## 2013-12-12 ENCOUNTER — Ambulatory Visit (INDEPENDENT_AMBULATORY_CARE_PROVIDER_SITE_OTHER): Payer: Self-pay | Admitting: Family Medicine

## 2013-12-12 ENCOUNTER — Encounter: Payer: Self-pay | Admitting: Family Medicine

## 2013-12-12 VITALS — BP 119/79 | HR 71 | Temp 98.0°F | Ht 66.0 in | Wt 249.0 lb

## 2013-12-12 DIAGNOSIS — E785 Hyperlipidemia, unspecified: Secondary | ICD-10-CM

## 2013-12-12 DIAGNOSIS — E119 Type 2 diabetes mellitus without complications: Secondary | ICD-10-CM

## 2013-12-12 DIAGNOSIS — K429 Umbilical hernia without obstruction or gangrene: Secondary | ICD-10-CM

## 2013-12-12 LAB — POCT GLYCOSYLATED HEMOGLOBIN (HGB A1C): Hemoglobin A1C: 8.6

## 2013-12-12 MED ORDER — ATORVASTATIN CALCIUM 10 MG PO TABS: 10.0000 mg | ORAL_TABLET | Freq: Every day | ORAL | Status: DC

## 2013-12-12 NOTE — Assessment & Plan Note (Signed)
Started atorvastatin, will check lipid panel whenever she gets the orange card

## 2013-12-12 NOTE — Patient Instructions (Signed)
Great to see you again  Work on getting your orange card.   The medicine I have started is Atorvastatin

## 2013-12-12 NOTE — Assessment & Plan Note (Addendum)
Not at goal today with A1c of 8.6 She notes dietary indiscretions over Christmas and would like to work on her diet before starting her medications She's not taking glipizide, but that's what I would add if it still elevated 3 months Discussed with her low-carb diet for weight loss and CBG control Started atorvastatin 10 mg, previously had myalgias with simvastatin

## 2013-12-12 NOTE — Progress Notes (Signed)
Patient ID: Meredith Maynard, female   DOB: 08/07/1966, 48 y.o.   MRN: [id number]  Kenn File, MD Phone: [phone]  Subjective:  Chief complaint-noted  # Patient here for followup of abdominal pain, and diabetes  Abdominal pain  she states that her abdominal pain improved since her last visit she's been using Pepcid 40 mg once daily whenever she has pain and it improves it well.   Diabetes Patient reports dietary indiscretions over the holidays, she's been compliant with all her medications. She's lost her glucose meter and so has not checked her blood sugars at all.  Ventral hernia She has a history of a large ventral hernia for the last 7 years after a described emergent surgery for bowel obstruction. She's working on giving her orange card and she asked if I could refer her to a Psychologist, sport and exercise for evaluation. She denies any pain, but is annoyed by the large hernia as she says that it puts pressure on her bladder and makes her pee frequently.  ROS- No Fever, chills, sweats No dyspnea No chest pain No abdominal pain  Past Medical History Patient Active Problem List   Diagnosis Date Noted  . Abdominal pain, epigastric 11/28/2013  . Musculoskeletal pain 08/14/2013  . Health care maintenance 04/25/2013  . Trigger finger, acquired 04/25/2013  . Fatigue 04/24/2013  . Status post tooth extraction 11/21/2012  . Plantar fasciitis of right foot 07/31/2012  . Right leg pain 07/08/2012  . LUQ abdominal pain 04/30/2012  . Multiple joint pain 04/11/2012  . Chest pain 12/14/2011  . Shoulder mass 12/08/2011  . Irregular periods/menstrual cycles 09/28/2011  . Leg length discrepancy 09/27/2011  . Pes planus 09/27/2011  . Back pain 08/31/2011  . 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 Umbilical hernia 36/14/4315  . Sciatica of right side 04/18/2011  . GERD 04/08/2009  . HYPERLIPIDEMIA 03/20/2009  . UNSPECIFIED HEARING LOSS 12/31/2008  . DM, UNCOMPLICATED, TYPE II 40/05/6760  .  DIZZINESS, CHRONIC 01/17/2007  . OBESITY, NOS 12/28/2006  . ANEMIA, IRON DEFICIENCY, UNSPEC. 12/28/2006  . ANXIETY 12/28/2006  . HYPERTENSION, BENIGN SYSTEMIC 12/28/2006    Medications- reviewed and updated Current Outpatient Prescriptions  Medication Sig Dispense Refill  . aspirin EC 81 MG tablet Take 1 tablet (81 mg total) by mouth daily.  30 tablet  11  . atorvastatin (LIPITOR) 10 MG tablet Take 1 tablet (10 mg total) by mouth daily.  30 tablet  11  . famotidine (PEPCID) 20 MG tablet Take 1 tablet (20 mg total) by mouth 2 (two) times daily.  10 tablet  0  . ferrous sulfate 325 (65 FE) MG EC tablet Take 325 mg by mouth daily.      Marland Kitchen ibuprofen (ADVIL,MOTRIN) 200 MG tablet Take 400 mg by mouth every 6 (six) hours as needed for moderate pain. For pain      . lisinopril (PRINIVIL,ZESTRIL) 20 MG tablet Take 20 mg by mouth daily.      . metFORMIN (GLUCOPHAGE) 1000 MG tablet Take 1,000 mg by mouth 2 (two) times daily with a meal.      . Multiple Vitamins-Minerals (MULTIVITAMIN WITH MINERALS) tablet Take 1 tablet by mouth daily.      Marland Kitchen omeprazole (PRILOSEC) 20 MG capsule Take 1 capsule (20 mg total) by mouth daily.  30 capsule  3  . ondansetron (ZOFRAN ODT) 4 MG disintegrating tablet 4mg  ODT q4 hours prn nausea/vomit  8 tablet  0   No current facility-administered medications for this visit.    Objective: BP  119/79  Pulse 71  Temp(Src) 98 F (36.7 C) (Oral)  Ht 5\' 6"  (1.676 m)  Wt 249 lb (112.946 kg)  BMI 40.21 kg/m2  LMP 12/07/2013 Gen: NAD, alert, cooperative with exam HEENT: NCAT CV: RRR, good S1/S2, no murmur Resp: CTABL, no wheezes, non-labored Abd: SNTND, large hernia present on the ventral abdominal wall, unable to appreciate the defect Neuro: Alert and oriented, No gross deficits Diabetic foot exam: 2+ DP pulses, no apparent lesions, monofilament sensation intact throughout bilaterally  Assessment/Plan:  Umbilical hernia Stable, no indication for incarceration, very  large She is transit of the orange card now, we may be able to get her into [facility] [place] [facility] then   DM, UNCOMPLICATED, TYPE II Not at goal today with A1c of 8.6 She notes dietary indiscretions over Christmas and would like to work on her diet before starting her medications She's not taking glipizide, but that's what I would add if it still elevated 3 months Discussed with her low-carb diet for weight loss and CBG control Started atorvastatin 10 mg, previously had myalgias with simvastatin  HYPERLIPIDEMIA Started atorvastatin, will check lipid panel whenever she gets the orange card    Orders Placed This Encounter  Procedures  . POCT A1C    Meds ordered this encounter  Medications  . atorvastatin (LIPITOR) 10 MG tablet    Sig: Take 1 tablet (10 mg total) by mouth daily.    Dispense:  30 tablet    Refill:  11

## 2013-12-12 NOTE — Assessment & Plan Note (Signed)
Stable, no indication for incarceration, very large She is transit of the orange card now, we may be able to get her into [facility] [place] [facility] then

## 2014-01-06 ENCOUNTER — Other Ambulatory Visit: Payer: Self-pay | Admitting: *Deleted

## 2014-01-06 DIAGNOSIS — E785 Hyperlipidemia, unspecified: Secondary | ICD-10-CM

## 2014-01-06 DIAGNOSIS — E119 Type 2 diabetes mellitus without complications: Secondary | ICD-10-CM

## 2014-01-06 MED ORDER — ATORVASTATIN CALCIUM 10 MG PO TABS: 10.0000 mg | ORAL_TABLET | Freq: Every day | ORAL | Status: DC

## 2014-01-13 ENCOUNTER — Other Ambulatory Visit: Payer: Self-pay | Admitting: *Deleted

## 2014-01-13 MED ORDER — LISINOPRIL 20 MG PO TABS: 20.0000 mg | ORAL_TABLET | Freq: Every day | ORAL | Status: DC

## 2014-03-12 ENCOUNTER — Encounter: Payer: Self-pay | Admitting: Emergency Medicine

## 2014-03-12 ENCOUNTER — Ambulatory Visit (INDEPENDENT_AMBULATORY_CARE_PROVIDER_SITE_OTHER): Payer: Self-pay | Admitting: Emergency Medicine

## 2014-03-12 VITALS — BP 113/76 | HR 88 | Temp 98.2°F | Ht 66.0 in | Wt 248.3 lb

## 2014-03-12 DIAGNOSIS — R5383 Other fatigue: Secondary | ICD-10-CM

## 2014-03-12 DIAGNOSIS — R5381 Other malaise: Secondary | ICD-10-CM

## 2014-03-12 DIAGNOSIS — E119 Type 2 diabetes mellitus without complications: Secondary | ICD-10-CM

## 2014-03-12 DIAGNOSIS — E785 Hyperlipidemia, unspecified: Secondary | ICD-10-CM

## 2014-03-12 LAB — CBC
HCT: 32 % — ABNORMAL LOW (ref 36.0–46.0)
Hemoglobin: 9.8 g/dL — ABNORMAL LOW (ref 12.0–15.0)
MCH: 23.4 pg — ABNORMAL LOW (ref 26.0–34.0)
MCHC: 30.6 g/dL (ref 30.0–36.0)
MCV: 76.4 fL — ABNORMAL LOW (ref 78.0–100.0)
Platelets: 216 10*3/uL (ref 150–400)
RBC: 4.19 MIL/uL (ref 3.87–5.11)
RDW: 17.1 % — AB (ref 11.5–15.5)
WBC: 6.2 10*3/uL (ref 4.0–10.5)

## 2014-03-12 LAB — LDL CHOLESTEROL, DIRECT: LDL DIRECT: 108 mg/dL — AB

## 2014-03-12 LAB — COMPLETE METABOLIC PANEL WITH GFR
ALK PHOS: 94 U/L (ref 39–117)
ALT: 25 U/L (ref 0–35)
AST: 18 U/L (ref 0–37)
Albumin: 3.6 g/dL (ref 3.5–5.2)
BUN: 12 mg/dL (ref 6–23)
CALCIUM: 8.9 mg/dL (ref 8.4–10.5)
CO2: 28 mEq/L (ref 19–32)
CREATININE: 0.61 mg/dL (ref 0.50–1.10)
Chloride: 101 mEq/L (ref 96–112)
GFR, Est African American: >89 mL/min
GFR, Est Non African American: >89 mL/min
Glucose, Bld: 287 mg/dL — ABNORMAL HIGH (ref 70–99)
Potassium: 4.1 mEq/L (ref 3.5–5.3)
Sodium: 136 mEq/L (ref 135–145)
Total Bilirubin: 0.4 mg/dL (ref 0.2–1.2)
Total Protein: 6 g/dL (ref 6.0–8.3)

## 2014-03-12 LAB — POCT GLYCOSYLATED HEMOGLOBIN (HGB A1C): HEMOGLOBIN A1C: 8.4

## 2014-03-12 LAB — TSH: TSH: 0.874 u[IU]/mL (ref 0.350–4.500)

## 2014-03-12 MED ORDER — GLIPIZIDE 5 MG PO TABS: 5.0000 mg | ORAL_TABLET | Freq: Every day | ORAL | Status: DC

## 2014-03-12 MED ORDER — GUAIFENESIN ER 600 MG PO TB12: 600.0000 mg | ORAL_TABLET | Freq: Two times a day (BID) | ORAL | Status: DC

## 2014-03-12 NOTE — Patient Instructions (Addendum)
It was nice to see you!  We are adding glipizide to help control your diabetes. Take 1 pill daily with breakfast.  We are also checking some blood work for the fatigue and shortness of breath. You should take Mucinex 1 pill twice a day for 5 days to see if that will help the congested feeling.  Please follow up in 2-4 weeks with your regular doctor.  Fue bueno verte!  Estamos aadiendo glipizida para ayudar a Chief Technology Officer su diabetes. Tome 1 pldora diaria con el desayuno.  Tambin estamos revisando algunos anlisis de sangre para la fatiga y falta de [place]. Usted debe tomar Mucinex [place] 5 das para ver si eso va a ayudar a la sensacin congestionada.  Por favor, el seguimiento en 2-4 semanas con su mdico de cabecera.

## 2014-03-12 NOTE — Progress Notes (Signed)
   Subjective:    Patient ID: Meredith Maynard, female    DOB: 1966-10-18, 48 y.o.   MRN: [id number]  HPI Meredith Maynard is here for diabetes and fatigue.  Diabetes Compliant with medications: yes Side effects from medications: no Check sugars at home: no  Polyuria: no Polydipsia: no Vision changes: no Hypoglycemic symptoms: no  Fatigue She reports relatively acute onset of fatigue and shortness of breath 2 days ago. It started at night when she felt like she could not catch her breath. She states today she feels mostly better, but still feels like she cannot take a deep breath, and feels like she has a lot of congestion in her nose and chest. She is coughing but not producing any sputum, there is no nasal discharge. She also states her feet have been swollen the last 2 days. She kept her feet up yesterday so the swelling is not bad today. She also reports low energy and feeling run down.  Hyperlipidemia She is taking the atorvastatin.  She also started glucosamine which she states helps the muscle aches from the statin.  Current Outpatient Prescriptions on File Prior to Visit  Medication Sig Dispense Refill  . aspirin EC 81 MG tablet Take 1 tablet (81 mg total) by mouth daily.  30 tablet  11  . atorvastatin (LIPITOR) 10 MG tablet Take 1 tablet (10 mg total) by mouth daily.  30 tablet  11  . famotidine (PEPCID) 20 MG tablet Take 1 tablet (20 mg total) by mouth 2 (two) times daily.  10 tablet  0  . ferrous sulfate 325 (65 FE) MG EC tablet Take 325 mg by mouth daily.      Marland Kitchen ibuprofen (ADVIL,MOTRIN) 200 MG tablet Take 400 mg by mouth every 6 (six) hours as needed for moderate pain. For pain      . lisinopril (PRINIVIL,ZESTRIL) 20 MG tablet Take 1 tablet (20 mg total) by mouth daily.  30 tablet  11  . metFORMIN (GLUCOPHAGE) 1000 MG tablet Take 1,000 mg by mouth 2 (two) times daily with a meal.      . Multiple Vitamins-Minerals (MULTIVITAMIN WITH MINERALS) tablet Take 1 tablet by  mouth daily.      Marland Kitchen omeprazole (PRILOSEC) 20 MG capsule Take 1 capsule (20 mg total) by mouth daily.  30 capsule  3  . ondansetron (ZOFRAN ODT) 4 MG disintegrating tablet 4mg  ODT q4 hours prn nausea/vomit  8 tablet  0   No current facility-administered medications on file prior to visit.    I have reviewed and updated the following as appropriate: allergies and current medications SHx: never smoker   Review of Systems See HPI    Objective:   Physical Exam BP 113/76  Pulse 88  Temp(Src) 98.2 F (36.8 C) (Oral)  Ht 5\' 6"  (1.676 m)  Wt 248 lb 4.8 oz (112.628 kg)  BMI 40.10 kg/m2 Gen: alert, cooperative, NAD HEENT: AT/[place], sclera white, MMM Neck: supple CV: RRR, no murmurs Pulm: CTAB, no wheezes or rales Ext: no edema, 2+ DP pulses bilaterally     Assessment & Plan:

## 2014-03-12 NOTE — Assessment & Plan Note (Signed)
Will check LDL today as she has been on lipitor 10mg  for the last 3 months.

## 2014-03-12 NOTE — Assessment & Plan Note (Signed)
Associated with shortness of breath and leg swelling is concerned for cardiac etiology but relatively abrupt onset and short course. Will check blood work today. Recommended mucinex to help with the congested feeling. F/u in 2-4 weeks with PCP.

## 2014-03-12 NOTE — Assessment & Plan Note (Signed)
A1c 8.4%. Continue metformin 1,000mg  BID. Add glipizide 5mg  daily. She will continue to work on diet.  F/u in 3 months.

## 2014-03-13 ENCOUNTER — Telehealth: Payer: Self-pay | Admitting: Emergency Medicine

## 2014-03-13 LAB — PRO B NATRIURETIC PEPTIDE: Pro B Natriuretic peptide (BNP): 194.1 pg/mL — ABNORMAL HIGH (ref <126)

## 2014-03-13 MED ORDER — FUROSEMIDE 20 MG PO TABS
20.0000 mg | ORAL_TABLET | Freq: Every day | ORAL | Status: DC
Start: 2014-03-13 — End: 2016-01-04

## 2014-03-13 NOTE — Telephone Encounter (Signed)
I have reviewed her blood work.  One of the lab tests shows that she likely has increased fluid in her body.  This may be causing her shortness of breath and fatigue.  I sent in a medicine, Lasix, to help get extra water off.  She should take it once a day.  She should make an appointment with her regular doctor sometime next week.  I have forwarded this note to [place] so she can notify the patient.

## 2014-03-27 ENCOUNTER — Telehealth: Payer: Self-pay | Admitting: Family Medicine

## 2014-03-27 NOTE — Telephone Encounter (Signed)
Pt came to [facility] an got information about lab results and medication.  Meredith Maynard

## 2014-03-27 NOTE — Telephone Encounter (Signed)
I called pt and LVM. Pt hopefully will call us back.   [place]

## 2014-05-19 ENCOUNTER — Ambulatory Visit: Payer: Self-pay | Admitting: Family Medicine

## 2014-05-26 ENCOUNTER — Ambulatory Visit (INDEPENDENT_AMBULATORY_CARE_PROVIDER_SITE_OTHER): Payer: Self-pay | Admitting: Family Medicine

## 2014-05-26 ENCOUNTER — Encounter: Payer: Self-pay | Admitting: Family Medicine

## 2014-05-26 VITALS — BP 109/70 | HR 66 | Temp 97.4°F | Resp 18 | Wt 245.0 lb

## 2014-05-26 DIAGNOSIS — K5900 Constipation, unspecified: Secondary | ICD-10-CM

## 2014-05-26 DIAGNOSIS — I8390 Asymptomatic varicose veins of unspecified lower extremity: Secondary | ICD-10-CM

## 2014-05-26 DIAGNOSIS — H9190 Unspecified hearing loss, unspecified ear: Secondary | ICD-10-CM

## 2014-05-26 MED ORDER — LISINOPRIL 20 MG PO TABS: 20.0000 mg | ORAL_TABLET | Freq: Every day | ORAL | Status: DC

## 2014-05-26 MED ORDER — METFORMIN HCL 1000 MG PO TABS: 1000.0000 mg | ORAL_TABLET | Freq: Two times a day (BID) | ORAL | Status: DC

## 2014-05-26 MED ORDER — GLIPIZIDE 5 MG PO TABS: 5.0000 mg | ORAL_TABLET | Freq: Every day | ORAL | Status: DC

## 2014-05-26 MED ORDER — FAMOTIDINE 20 MG PO TABS: 20.0000 mg | ORAL_TABLET | Freq: Two times a day (BID) | ORAL | Status: AC

## 2014-05-26 NOTE — Progress Notes (Signed)
Patient ID: Meredith Maynard, female   DOB: [birth year], 48 y.o.   MRN: [id number]  Kenn File, MD Phone: [phone]  Subjective:  Chief complaint-noted  Pt Here for constipation and right leg pain.  Right leg pain due to a varicose vein. She states that she has worse pain around the area worsened by walking or working and resolves with rest or wearing pantyhose. She can't wear pain he is continually at work because he started to bother her after an hour or so. The pain radiates down the right side of her leg. It does not prevent her from sleeping or working. The pain also radiates down the right side of her leg  Constipation and hemorrhoid The patient states that she has one stool daily, however whenever she gets constipated she has to strain at stool. Occasionally she has a small amount of blood when she wipes. She also has intermittent rectal pain she feels is due to hemorrhoids. She does not have a high fiber diet and take some fiber supplements. She states that this makes her ventral hernia pain is slightly worse when she has to strain at stool  She is working on losing weight since she did have a hernia operation.  ROS-  No fever, chills, sweats No dyspnea And chest pain  Needs refills, will followup next month for chronic disease  Past Medical History Patient Active Problem List   Diagnosis Date Noted  . Abdominal pain, epigastric 11/28/2013  . Musculoskeletal pain 08/14/2013  . Health care maintenance 04/25/2013  . Trigger finger, acquired 04/25/2013  . Fatigue 04/24/2013  . Status post tooth extraction 11/21/2012  . Plantar fasciitis of right foot 07/31/2012  . Right leg pain 07/08/2012  . LUQ abdominal pain 04/30/2012  . Multiple joint pain 04/11/2012  . Chest pain 12/14/2011  . Shoulder mass 12/08/2011  . Irregular periods/menstrual cycles 09/28/2011  . Leg length discrepancy 09/27/2011  . Pes planus 09/27/2011  . Back pain 08/31/2011  . Varicose vein  of leg 08/31/2011  . Umbilical hernia 04/29/1600  . Sciatica of right side 04/18/2011  . GERD 04/08/2009  . HYPERLIPIDEMIA 03/20/2009  . UNSPECIFIED HEARING LOSS 12/31/2008  . DM, UNCOMPLICATED, TYPE II 09/32/3557  . DIZZINESS, CHRONIC 01/17/2007  . OBESITY, NOS 12/28/2006  . ANEMIA, IRON DEFICIENCY, UNSPEC. 12/28/2006  . ANXIETY 12/28/2006  . HYPERTENSION, BENIGN SYSTEMIC 12/28/2006    Medications- reviewed and updated Current Outpatient Prescriptions  Medication Sig Dispense Refill  . aspirin EC 81 MG tablet Take 1 tablet (81 mg total) by mouth daily.  30 tablet  11  . atorvastatin (LIPITOR) 10 MG tablet Take 1 tablet (10 mg total) by mouth daily.  30 tablet  11  . famotidine (PEPCID) 20 MG tablet Take 1 tablet (20 mg total) by mouth 2 (two) times daily.  10 tablet  0  . ferrous sulfate 325 (65 FE) MG EC tablet Take 325 mg by mouth daily.      . furosemide (LASIX) 20 MG tablet Take 1 tablet (20 mg total) by mouth daily.  30 tablet  3  . glipiZIDE (GLUCOTROL) 5 MG tablet Take 1 tablet (5 mg total) by mouth daily before breakfast.  30 tablet  3  . guaiFENesin (MUCINEX) 600 MG 12 hr tablet Take 1 tablet (600 mg total) by mouth 2 (two) times daily.  10 tablet  0  . ibuprofen (ADVIL,MOTRIN) 200 MG tablet Take 400 mg by mouth every 6 (six) hours as needed for moderate pain. For pain      .  lisinopril (PRINIVIL,ZESTRIL) 20 MG tablet Take 1 tablet (20 mg total) by mouth daily.  30 tablet  11  . metFORMIN (GLUCOPHAGE) 1000 MG tablet Take 1 tablet (1,000 mg total) by mouth 2 (two) times daily with a meal.  60 tablet  11  . Multiple Vitamins-Minerals (MULTIVITAMIN WITH MINERALS) tablet Take 1 tablet by mouth daily.      Marland Kitchen omeprazole (PRILOSEC) 20 MG capsule Take 1 capsule (20 mg total) by mouth daily.  30 capsule  3  . ondansetron (ZOFRAN ODT) 4 MG disintegrating tablet 4mg  ODT q4 hours prn nausea/vomit  8 tablet  0   No current facility-administered medications for this visit.     Objective: BP 109/70  Pulse 66  Temp(Src) 97.4 F (36.3 C) (Oral)  Resp 18  Wt 245 lb (111.131 kg)  SpO2 99% Gen: NAD, alert, cooperative with exam HEENT: NCAT Abd: SNTND, BS present, no guarding or organomegaly Ext: No edema, warm, approximately 2-3 cm long by half centimeter wide varicose vein on the right lateral knee that slightly tender to palpation. No surrounding erythema, warmth, or induration. Neuro: Alert and oriented, No gross deficits, normal gait   Assessment/Plan:  Varicose vein of leg Large varicose vein on the right leg, Painful at times, worsened by walking Recommend intermittent pain he has/compression hose use She can't get a vascular surgery due to limitations with insurance   UNSPECIFIED HEARING LOSS Patient with constipation, leading to likely external hemorrhoid Recommend Metamucil once dail X 7 then BID,  Miralax if not producing oen easy stool daily  F/u 1 month for chronic ds and will discuss again then    Meds ordered this encounter  Medications  . famotidine (PEPCID) 20 MG tablet    Sig: Take 1 tablet (20 mg total) by mouth 2 (two) times daily.    Dispense:  10 tablet    Refill:  0  . glipiZIDE (GLUCOTROL) 5 MG tablet    Sig: Take 1 tablet (5 mg total) by mouth daily before breakfast.    Dispense:  30 tablet    Refill:  3  . lisinopril (PRINIVIL,ZESTRIL) 20 MG tablet    Sig: Take 1 tablet (20 mg total) by mouth daily.    Dispense:  30 tablet    Refill:  11  . metFORMIN (GLUCOPHAGE) 1000 MG tablet    Sig: Take 1 tablet (1,000 mg total) by mouth 2 (two) times daily with a meal.    Dispense:  60 tablet    Refill:  11

## 2014-05-26 NOTE — Assessment & Plan Note (Signed)
Patient with constipation, leading to likely external hemorrhoid Recommend Metamucil once dail X 7 then BID,  Miralax if not producing oen easy stool daily  F/u 1 month for chronic ds and will discuss again then

## 2014-05-26 NOTE — Assessment & Plan Note (Signed)
Large varicose vein on the right leg, Painful at times, worsened by walking Recommend intermittent pain he has/compression hose use She can't get a vascular surgery due to limitations with insurance

## 2014-05-26 NOTE — Patient Instructions (Signed)
Great to see you  Metamucil 1 to 2 times daily.  Preparation H for pain   Hemorroides  (Hemorrhoids)  Las hemorroides son venas inflamadas (hinchadas) alrededor del recto o del ano. Las hemorroides causan dolor, picazn, sangrado o irritacin.  CUIDADOS EN EL HOGAR   Consuma alimentos con fibra, como cereales integrales, legumbres, frutos secos, frutas y verduras. Pregntele a su mdico acerca de tomar productos con fibra aadida en ellos (suplementos defibra).  Beba gran cantidad de lquido para mantener el pis (orina) de tono claro o amarillo plido.  Haga ejercicio a menudo.  Vaya al bao cuando sienta la necesidad de ir de cuerpo. No espere.  Evite hacer fuerza al ir de cuerpo Architectural technologist intestino).Colin Rhein la zona anal limpia y seca. Use papel higinico mojado o toallitas de papel humedecidas.  [place] cremas recetadas y los medicamentos que se aplican en el ano (supositorio anal )  Slo tome los medicamentos que le indique el mdico.  Tome un bao de agua tibia (bao de asiento) durante 15 a 20 minutos para Best boy. Repita 3  4 veces por da.  Coloque una bolsa de hielo sobre la zona si le duele o est inflamada. Use compresas de hielo [employer] de agua tibia.  Ponga el hielo en una bolsa plstica.  Colquese una toalla entre la piel y la bolsa de hielo.  Deje el hielo en el lugar durante 15 a 20 minutos, 3 a 4 veces por da.  No utilice una almohada en forma de aro ni se siente en el inodoro durante perodos prolongados. SOLICITE AYUDA DE INMEDIATO SI:   Aumenta el dolor y no puede controlarlo con la medicacin o con Lexicographer.  Tiene una hemorragia que no se detiene.  Tiene dificultado o no puede ir de cuerpo Architectural technologist intestino).  Siente dolor o tiene inflamacin fuera de la zona de las hemorroides. ASEGRESE DE QUE:   Comprende estas instrucciones.  Controlar su  enfermedad.  Solicitar ayuda de inmediato si no mejora o si empeora. Document Released: 02/11/2013 [facility] Patient Information 2015 [place]. This information is not intended to replace advice given to you by your health care provider. Make sure you discuss any questions you have with your health care provider.

## 2015-02-20 ENCOUNTER — Ambulatory Visit (INDEPENDENT_AMBULATORY_CARE_PROVIDER_SITE_OTHER): Payer: Self-pay | Admitting: Family Medicine

## 2015-02-20 ENCOUNTER — Encounter: Payer: Self-pay | Admitting: Family Medicine

## 2015-02-20 VITALS — BP 112/73 | HR 76 | Temp 98.5°F | Ht 66.0 in | Wt 243.5 lb

## 2015-02-20 DIAGNOSIS — H5711 Ocular pain, right eye: Secondary | ICD-10-CM

## 2015-02-20 DIAGNOSIS — B351 Tinea unguium: Secondary | ICD-10-CM | POA: Insufficient documentation

## 2015-02-20 DIAGNOSIS — H5710 Ocular pain, unspecified eye: Secondary | ICD-10-CM | POA: Insufficient documentation

## 2015-02-20 DIAGNOSIS — E1169 Type 2 diabetes mellitus with other specified complication: Secondary | ICD-10-CM

## 2015-02-20 DIAGNOSIS — E119 Type 2 diabetes mellitus without complications: Secondary | ICD-10-CM

## 2015-02-20 DIAGNOSIS — E669 Obesity, unspecified: Secondary | ICD-10-CM

## 2015-02-20 DIAGNOSIS — E785 Hyperlipidemia, unspecified: Secondary | ICD-10-CM

## 2015-02-20 HISTORY — DX: Tinea unguium: B35.1

## 2015-02-20 MED ORDER — GLIPIZIDE 5 MG PO TABS: 5.0000 mg | ORAL_TABLET | Freq: Every day | ORAL | Status: DC

## 2015-02-20 MED ORDER — ATORVASTATIN CALCIUM 10 MG PO TABS: 10.0000 mg | ORAL_TABLET | Freq: Every day | ORAL | Status: DC

## 2015-02-20 MED ORDER — LISINOPRIL 20 MG PO TABS: 20.0000 mg | ORAL_TABLET | Freq: Every day | ORAL | Status: DC

## 2015-02-20 MED ORDER — TERBINAFINE HCL 250 MG PO TABS: 250.0000 mg | ORAL_TABLET | Freq: Every day | ORAL | Status: DC

## 2015-02-20 MED ORDER — METFORMIN HCL 1000 MG PO TABS: 1000.0000 mg | ORAL_TABLET | Freq: Two times a day (BID) | ORAL | Status: DC

## 2015-02-20 NOTE — Patient Instructions (Signed)
Recomendo que [place] unas gotas en su ojo. Pregunta a la farmacia para "saline eye drops" no necesita receta.  Tambien voy a referirle a un optalmologo para su diabetes.

## 2015-02-23 NOTE — Progress Notes (Signed)
   Subjective:    Patient ID: Meredith Maynard, female    DOB: Apr 30, 1966, 49 y.o.   MRN: [id number]  HPI Pt presents with 1 week of eye pain shooting to her "brain"/top of head. She reports that last week she was going home from work, walking the parking lot, and she felt like sand flew into her eye. It watered and was very painful but that went away and for the rest of the week she just had shooting pain without foreign body sensation. Then yesterday she had the sand feeling again but it resolved and now she's back to the shooting pain. She has recently been working in [employer] with lots of heat and worries that this may be irritating her eyes. She is also concerned because she has high blood pressure and a cousin who had a similar complaint and then turned out to be having a stroke. She has never had this problem before, she hasn't taken anything for it and nothing makes it better or worse. No changes to her vision, no pain with eye movement.    Review of Systems See HPI    Objective:   Physical Exam  Constitutional: She is oriented to person, place, and time. She appears well-developed and well-nourished. No distress.  HENT:  Head: Normocephalic and atraumatic. Head is without right periorbital erythema and without left periorbital erythema.    Eyes: Conjunctivae, EOM and lids are normal. Pupils are equal, round, and reactive to light. Lids are everted and swept, no foreign bodies found. Right eye exhibits no chemosis, no discharge, no exudate and no hordeolum. No foreign body present in the right eye. Left eye exhibits no chemosis, no discharge, no exudate and no hordeolum. No foreign body present in the left eye. No scleral icterus.  Cardiovascular: Normal rate.   Pulmonary/Chest: Effort normal. No respiratory distress.  Abdominal: She exhibits no distension.  Neurological: She is alert and oriented to person, place, and time.  Skin: Skin is warm and dry. No rash noted. She is not  diaphoretic.  Psychiatric: She has a normal mood and affect. Her behavior is normal.  Nursing note and vitals reviewed.         Assessment & Plan:

## 2015-02-23 NOTE — Assessment & Plan Note (Addendum)
Eye pain radiating to top of head x1 week. Started with foreign body sensation which resolved. No vision changes or EOM problems. H/o HTN well controlled - patient primarily concerned that this might represent stroke or other high BP complication, reassured - exam normal - rec saline eye drops - rtc if not improving in 2 weeks and can consider optho referral at that point

## 2015-03-16 ENCOUNTER — Ambulatory Visit (INDEPENDENT_AMBULATORY_CARE_PROVIDER_SITE_OTHER): Payer: Self-pay | Admitting: Family Medicine

## 2015-03-16 ENCOUNTER — Encounter: Payer: Self-pay | Admitting: Family Medicine

## 2015-03-16 VITALS — BP 122/78 | HR 70 | Temp 98.6°F | Wt 242.0 lb

## 2015-03-16 DIAGNOSIS — E669 Obesity, unspecified: Secondary | ICD-10-CM

## 2015-03-16 DIAGNOSIS — B351 Tinea unguium: Secondary | ICD-10-CM

## 2015-03-16 DIAGNOSIS — E1169 Type 2 diabetes mellitus with other specified complication: Secondary | ICD-10-CM

## 2015-03-16 DIAGNOSIS — R0789 Other chest pain: Secondary | ICD-10-CM

## 2015-03-16 DIAGNOSIS — E119 Type 2 diabetes mellitus without complications: Secondary | ICD-10-CM

## 2015-03-16 DIAGNOSIS — I10 Essential (primary) hypertension: Secondary | ICD-10-CM

## 2015-03-16 LAB — GLUCOSE, CAPILLARY: Glucose-Capillary: 243 mg/dL — ABNORMAL HIGH (ref 65–99)

## 2015-03-16 LAB — POCT GLYCOSYLATED HEMOGLOBIN (HGB A1C): Hemoglobin A1C: 9.8

## 2015-03-16 MED ORDER — TERBINAFINE HCL 250 MG PO TABS: 250.0000 mg | ORAL_TABLET | Freq: Every day | ORAL | Status: DC

## 2015-03-16 MED ORDER — GLIPIZIDE 5 MG PO TABS: 5.0000 mg | ORAL_TABLET | Freq: Every day | ORAL | Status: DC

## 2015-03-16 NOTE — Patient Instructions (Signed)
Thank you for coming in, today!  I think your chest pain is from muscle / bone pain. You can continue to take Advil for this. It should get better over the next several days.  Your blood pressure looks good. Keep taking your lisinopril.  Your diabetes does look like your control is not great. Your A1c is 9.8 (goal is less than 7). I will refill your glipizide, today.  I will also give you the Lamisil for about 1 month.  Come back to see Dr. Wendi Snipes in about 1 month. He can readjust your medicines as needed. If you need to be seen sooner, call or come back any time.  Please feel free to call with any questions or concerns at any time, at [phone]. --Dr. Venetia Maxon

## 2015-03-16 NOTE — Progress Notes (Signed)
Visit conducted in [place] via phone [employer], [phone]
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
## 2015-04-07 ENCOUNTER — Ambulatory Visit: Payer: Self-pay

## 2015-12-17 ENCOUNTER — Ambulatory Visit (INDEPENDENT_AMBULATORY_CARE_PROVIDER_SITE_OTHER): Payer: Self-pay | Admitting: Family Medicine

## 2015-12-17 ENCOUNTER — Encounter: Payer: Self-pay | Admitting: Family Medicine

## 2015-12-17 VITALS — BP 122/84 | HR 94 | Temp 98.2°F | Ht 66.0 in | Wt 235.0 lb

## 2015-12-17 DIAGNOSIS — R059 Cough, unspecified: Secondary | ICD-10-CM | POA: Insufficient documentation

## 2015-12-17 DIAGNOSIS — R05 Cough: Secondary | ICD-10-CM

## 2015-12-17 MED ORDER — FLUTICASONE PROPIONATE 50 MCG/ACT NA SUSP: 2.0000 | Freq: Every day | NASAL | Status: DC

## 2015-12-17 NOTE — Progress Notes (Signed)
   Subjective:    Patient ID: Meredith Maynard, female    DOB: Jan 16, 1966, 50 y.o.   MRN: [id number]  Seen for Same day visit for   CC: Cough She reports cough for one month.  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  However, she does report fevers, chills, runny nose for the past 2 weeks which have been slowly improving.  She denies chest pain, shortness of breath or rash.  She took allergy medicine for 1 week, then stopped after not noticing any improvement.  She has been on lisinopril for approximately 10 years with no cough prior to this.  She denies any orthopnea, PND, lower extremity swelling.  She reports history of GERD but denies any current reflux, burning sensation.  She is no longer taking her PPI or Pepcid.   COUGH  Has been coughing for 20 days. Cough is: productive Sputum production: mild, yellow Medications tried: OTC and Claritin Taking blood pressure medications: He has  Symptoms Runny nose: Yes Mucous in back of throat: Yes Throat burning or reflux: No Wheezing or asthma: No Fever: Subjective Chest Pain: No Shortness of breath: No Leg swelling: No Hemoptysis: No Weight loss: No  ROS see HPI Smoking Status noted  Objective:  BP 122/84 mmHg  Pulse 94  Temp(Src) 98.2 F (36.8 C) (Oral)  Ht 5\' 6"  (1.676 m)  Wt 235 lb (106.595 kg)  BMI 37.95 kg/m2  SpO2 98%  General: NAD HEENT: Nasal turbinates mildly swollen and erythematous.  TMs clear bilaterally.  No cervical adenopathy.  Thyroid nonpalpable.  Pharyngeal erythema and cobblestoning Cardiac: RRR, normal heart sounds, no murmurs. Respiratory: CTAB, normal effort Extremities: no edema or cyanosis. WWP. Skin: warm and dry, no rashes noted    Assessment & Plan:   Cough Mildly productive cough for one month with subjective fevers, runny nose for the past 1-2 weeks.  History of GERD, not currently on medications.  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  Vital signs normal and lungs clear.  - Cough, possibly due to irritation from dust from ongoing construction work versus GERD or viral URI.  No signs of pneumonia or sinus infection.  - Recommended restarting allergy medication - Prescribed Flonase daily - Recommended symptomatic treatment with cough drops, warm to use honey as needed - Recommended follow-up with PCP in 1-2 weeks for follow-up of cough as well as diabetes - Cough, less likely related to lisinopril as has been on for 10 years, but may need to hold if cough not improving in 1-2 weeks

## 2015-12-17 NOTE — Patient Instructions (Addendum)
  Su congestin nasal, y la tos es ms probable debido a la enfermedad viral actual contra la irritacin del polvo. - Contine tomando medicamentos para la alergia cada noche - Comienza Flonase 2 por cada fosa nasal cada da - Contine bebiendo muchos lquidos y puede probar t caliente y miel a la garganta severa - Pastillas para la tos, segn sea necesario  Seguimiento con el Dr. Sherril Cong y 1-2 semanas para su diabetes

## 2015-12-17 NOTE — Assessment & Plan Note (Signed)
Mildly productive cough for one month with subjective fevers, runny nose for the past 1-2 weeks.  History of GERD, not currently on medications.  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  Vital signs normal and lungs clear.  - Cough, possibly due to irritation from dust from ongoing construction work versus GERD or viral URI.  No signs of pneumonia or sinus infection.  - Recommended restarting allergy medication - Prescribed Flonase daily - Recommended symptomatic treatment with cough drops, warm to use honey as needed - Recommended follow-up with PCP in 1-2 weeks for follow-up of cough as well as diabetes - Cough, less likely related to lisinopril as has been on for 10 years, but may need to hold if cough not improving in 1-2 weeks

## 2016-01-03 ENCOUNTER — Encounter (HOSPITAL_COMMUNITY): Payer: Self-pay | Admitting: Emergency Medicine

## 2016-01-03 DIAGNOSIS — I10 Essential (primary) hypertension: Secondary | ICD-10-CM | POA: Diagnosis present

## 2016-01-03 DIAGNOSIS — K760 Fatty (change of) liver, not elsewhere classified: Secondary | ICD-10-CM | POA: Diagnosis present

## 2016-01-03 DIAGNOSIS — K219 Gastro-esophageal reflux disease without esophagitis: Secondary | ICD-10-CM | POA: Diagnosis present

## 2016-01-03 DIAGNOSIS — Z6838 Body mass index (BMI) 38.0-38.9, adult: Secondary | ICD-10-CM

## 2016-01-03 DIAGNOSIS — E669 Obesity, unspecified: Secondary | ICD-10-CM | POA: Diagnosis present

## 2016-01-03 DIAGNOSIS — Z9049 Acquired absence of other specified parts of digestive tract: Secondary | ICD-10-CM

## 2016-01-03 DIAGNOSIS — Z7982 Long term (current) use of aspirin: Secondary | ICD-10-CM

## 2016-01-03 DIAGNOSIS — H8109 Meniere's disease, unspecified ear: Secondary | ICD-10-CM | POA: Diagnosis present

## 2016-01-03 DIAGNOSIS — Z7951 Long term (current) use of inhaled steroids: Secondary | ICD-10-CM

## 2016-01-03 DIAGNOSIS — Z7984 Long term (current) use of oral hypoglycemic drugs: Secondary | ICD-10-CM

## 2016-01-03 DIAGNOSIS — K660 Peritoneal adhesions (postprocedural) (postinfection): Secondary | ICD-10-CM | POA: Diagnosis present

## 2016-01-03 DIAGNOSIS — Z91040 Latex allergy status: Secondary | ICD-10-CM

## 2016-01-03 DIAGNOSIS — E119 Type 2 diabetes mellitus without complications: Secondary | ICD-10-CM | POA: Diagnosis present

## 2016-01-03 DIAGNOSIS — K430 Incisional hernia with obstruction, without gangrene: Principal | ICD-10-CM | POA: Diagnosis present

## 2016-01-03 DIAGNOSIS — E785 Hyperlipidemia, unspecified: Secondary | ICD-10-CM | POA: Diagnosis present

## 2016-01-03 DIAGNOSIS — Z23 Encounter for immunization: Secondary | ICD-10-CM

## 2016-01-03 LAB — CBC
HCT: 42 % (ref 36.0–46.0)
HEMOGLOBIN: 12.8 g/dL (ref 12.0–15.0)
MCH: 22 pg — AB (ref 26.0–34.0)
MCHC: 30.5 g/dL (ref 30.0–36.0)
MCV: 72 fL — AB (ref 78.0–100.0)
Platelets: 232 10*3/uL (ref 150–400)
RBC: 5.83 MIL/uL — AB (ref 3.87–5.11)
RDW: 19.5 % — ABNORMAL HIGH (ref 11.5–15.5)
WBC: 11.3 10*3/uL — ABNORMAL HIGH (ref 4.0–10.5)

## 2016-01-03 LAB — CBG MONITORING, ED: GLUCOSE-CAPILLARY: 263 mg/dL — AB (ref 65–99)

## 2016-01-03 MED ORDER — ONDANSETRON 4 MG PO TBDP: 4.0000 mg | ORAL_TABLET | Freq: Once | ORAL | Status: DC | PRN

## 2016-01-03 MED ORDER — ONDANSETRON 4 MG PO TBDP
ORAL_TABLET | ORAL | Status: AC
  Administered 2016-01-03: 4 mg
  Filled 2016-01-03: qty 1

## 2016-01-03 NOTE — ED Notes (Signed)
C/o generalized abd pain, nausea, vomiting, and diarrhea since Friday.  Reports history of gastritis.

## 2016-01-04 ENCOUNTER — Inpatient Hospital Stay (HOSPITAL_COMMUNITY)
Admission: EM | Admit: 2016-01-04 | Discharge: 2016-01-10 | DRG: 337 | Disposition: A | Discharge status: Home / Self Care | Payer: Medicaid Other | Attending: General Surgery | Admitting: General Surgery

## 2016-01-04 ENCOUNTER — Emergency Department (HOSPITAL_COMMUNITY): Payer: Medicaid Other

## 2016-01-04 ENCOUNTER — Emergency Department (HOSPITAL_COMMUNITY): Payer: Medicaid Other | Admitting: Certified Registered"

## 2016-01-04 ENCOUNTER — Encounter (HOSPITAL_COMMUNITY): Payer: Self-pay | Admitting: Radiology

## 2016-01-04 ENCOUNTER — Encounter (HOSPITAL_COMMUNITY): Admission: EM | Disposition: A | Discharge status: Home / Self Care | Payer: Self-pay | Source: Home / Self Care

## 2016-01-04 DIAGNOSIS — K219 Gastro-esophageal reflux disease without esophagitis: Secondary | ICD-10-CM | POA: Diagnosis present

## 2016-01-04 DIAGNOSIS — K436 Other and unspecified ventral hernia with obstruction, without gangrene: Secondary | ICD-10-CM

## 2016-01-04 DIAGNOSIS — Z23 Encounter for immunization: Secondary | ICD-10-CM | POA: Diagnosis not present

## 2016-01-04 DIAGNOSIS — Z7982 Long term (current) use of aspirin: Secondary | ICD-10-CM | POA: Diagnosis not present

## 2016-01-04 DIAGNOSIS — K430 Incisional hernia with obstruction, without gangrene: Secondary | ICD-10-CM | POA: Diagnosis present

## 2016-01-04 DIAGNOSIS — K660 Peritoneal adhesions (postprocedural) (postinfection): Secondary | ICD-10-CM | POA: Diagnosis present

## 2016-01-04 DIAGNOSIS — E785 Hyperlipidemia, unspecified: Secondary | ICD-10-CM | POA: Diagnosis present

## 2016-01-04 DIAGNOSIS — Z7984 Long term (current) use of oral hypoglycemic drugs: Secondary | ICD-10-CM | POA: Diagnosis not present

## 2016-01-04 DIAGNOSIS — K760 Fatty (change of) liver, not elsewhere classified: Secondary | ICD-10-CM | POA: Diagnosis present

## 2016-01-04 DIAGNOSIS — R109 Unspecified abdominal pain: Secondary | ICD-10-CM

## 2016-01-04 DIAGNOSIS — E669 Obesity, unspecified: Secondary | ICD-10-CM | POA: Diagnosis present

## 2016-01-04 DIAGNOSIS — I10 Essential (primary) hypertension: Secondary | ICD-10-CM | POA: Diagnosis present

## 2016-01-04 DIAGNOSIS — Z6838 Body mass index (BMI) 38.0-38.9, adult: Secondary | ICD-10-CM | POA: Diagnosis not present

## 2016-01-04 DIAGNOSIS — Z7951 Long term (current) use of inhaled steroids: Secondary | ICD-10-CM | POA: Diagnosis not present

## 2016-01-04 DIAGNOSIS — H8109 Meniere's disease, unspecified ear: Secondary | ICD-10-CM | POA: Diagnosis present

## 2016-01-04 DIAGNOSIS — E119 Type 2 diabetes mellitus without complications: Secondary | ICD-10-CM | POA: Diagnosis present

## 2016-01-04 DIAGNOSIS — R1084 Generalized abdominal pain: Secondary | ICD-10-CM | POA: Diagnosis present

## 2016-01-04 DIAGNOSIS — Z9049 Acquired absence of other specified parts of digestive tract: Secondary | ICD-10-CM | POA: Diagnosis not present

## 2016-01-04 DIAGNOSIS — Z91040 Latex allergy status: Secondary | ICD-10-CM | POA: Diagnosis not present

## 2016-01-04 HISTORY — DX: Gastritis, unspecified, without bleeding: K29.70

## 2016-01-04 HISTORY — PX: INSERTION OF MESH: SHX5868

## 2016-01-04 HISTORY — PX: INCISIONAL HERNIA REPAIR: SHX193

## 2016-01-04 HISTORY — PX: LAPAROTOMY: SHX154

## 2016-01-04 LAB — GLUCOSE, CAPILLARY
GLUCOSE-CAPILLARY: 209 mg/dL — AB (ref 65–99)
GLUCOSE-CAPILLARY: 236 mg/dL — AB (ref 65–99)
Glucose-Capillary: 239 mg/dL — ABNORMAL HIGH (ref 65–99)

## 2016-01-04 LAB — COMPREHENSIVE METABOLIC PANEL
ALBUMIN: 3.9 g/dL (ref 3.5–5.0)
ALK PHOS: 109 U/L (ref 38–126)
ALT: 26 U/L (ref 14–54)
AST: 24 U/L (ref 15–41)
Anion gap: 13 (ref 5–15)
BUN: 16 mg/dL (ref 6–20)
CHLORIDE: 98 mmol/L — AB (ref 101–111)
CO2: 24 mmol/L (ref 22–32)
CREATININE: 0.61 mg/dL (ref 0.44–1.00)
Calcium: 9.7 mg/dL (ref 8.9–10.3)
GFR calc Af Amer: >60 mL/min (ref >60)
GFR calc non Af Amer: >60 mL/min (ref >60)
Glucose, Bld: 268 mg/dL — ABNORMAL HIGH (ref 65–99)
Potassium: 4.5 mmol/L (ref 3.5–5.1)
Sodium: 135 mmol/L (ref 135–145)
Total Bilirubin: 1.1 mg/dL (ref 0.3–1.2)
Total Protein: 8 g/dL (ref 6.5–8.1)

## 2016-01-04 LAB — CBG MONITORING, ED: GLUCOSE-CAPILLARY: 229 mg/dL — AB (ref 65–99)

## 2016-01-04 LAB — URINALYSIS, ROUTINE W REFLEX MICROSCOPIC
BILIRUBIN URINE: NEGATIVE
HGB URINE DIPSTICK: NEGATIVE
Ketones, ur: >80 mg/dL — AB
Leukocytes, UA: NEGATIVE
Nitrite: NEGATIVE
PH: 5.5 (ref 5.0–8.0)
Protein, ur: 30 mg/dL — AB
SPECIFIC GRAVITY, URINE: 1.03 (ref 1.005–1.030)

## 2016-01-04 LAB — LIPASE, BLOOD: LIPASE: 29 U/L (ref 11–51)

## 2016-01-04 LAB — URINE MICROSCOPIC-ADD ON: RBC / HPF: NONE SEEN RBC/hpf (ref 0–5)

## 2016-01-04 SURGERY — LAPAROTOMY, EXPLORATORY
Anesthesia: General | Site: Abdomen

## 2016-01-04 MED ORDER — CEFAZOLIN SODIUM-DEXTROSE 2-3 GM-% IV SOLR
2.0000 g | Freq: Three times a day (TID) | INTRAVENOUS | Status: DC
  Administered 2016-01-04 – 2016-01-06 (×5): 2 g via INTRAVENOUS
  Filled 2016-01-04 (×7): qty 50

## 2016-01-04 MED ORDER — FENTANYL CITRATE (PF) 250 MCG/5ML IJ SOLN
INTRAMUSCULAR | Status: AC
  Filled 2016-01-04: qty 5

## 2016-01-04 MED ORDER — ARTIFICIAL TEARS OP OINT
TOPICAL_OINTMENT | OPHTHALMIC | Status: DC | PRN
  Administered 2016-01-04: 1 via OPHTHALMIC

## 2016-01-04 MED ORDER — INSULIN GLARGINE 100 UNIT/ML ~~LOC~~ SOLN
10.0000 [IU] | Freq: Every day | SUBCUTANEOUS | Status: DC
  Administered 2016-01-04: 10 [IU] via SUBCUTANEOUS
  Filled 2016-01-04: qty 0.1

## 2016-01-04 MED ORDER — ONDANSETRON HCL 4 MG/2ML IJ SOLN
INTRAMUSCULAR | Status: AC
  Filled 2016-01-04: qty 4

## 2016-01-04 MED ORDER — SCOPOLAMINE 1 MG/3DAYS TD PT72
MEDICATED_PATCH | TRANSDERMAL | Status: DC | PRN
  Administered 2016-01-04: 1 via TRANSDERMAL

## 2016-01-04 MED ORDER — SODIUM CHLORIDE 0.9 % IJ SOLN
INTRAMUSCULAR | Status: AC
  Filled 2016-01-04: qty 10

## 2016-01-04 MED ORDER — ROCURONIUM BROMIDE 50 MG/5ML IV SOLN
INTRAVENOUS | Status: AC
  Filled 2016-01-04: qty 1

## 2016-01-04 MED ORDER — IOHEXOL 300 MG/ML  SOLN
100.0000 mL | Freq: Once | INTRAMUSCULAR | Status: AC | PRN
  Administered 2016-01-04: 100 mL via INTRAVENOUS

## 2016-01-04 MED ORDER — METOCLOPRAMIDE HCL 5 MG/ML IJ SOLN: 10.0000 mg | Freq: Once | INTRAMUSCULAR | Status: DC | PRN

## 2016-01-04 MED ORDER — ENOXAPARIN SODIUM 40 MG/0.4ML ~~LOC~~ SOLN
40.0000 mg | SUBCUTANEOUS | Status: DC
  Administered 2016-01-05 – 2016-01-09 (×5): 40 mg via SUBCUTANEOUS
  Filled 2016-01-04 (×6): qty 0.4

## 2016-01-04 MED ORDER — CEFAZOLIN SODIUM-DEXTROSE 2-3 GM-% IV SOLR
2.0000 g | INTRAVENOUS | Status: AC
  Administered 2016-01-04: 2 g via INTRAVENOUS
  Filled 2016-01-04: qty 50

## 2016-01-04 MED ORDER — NEOSTIGMINE METHYLSULFATE 10 MG/10ML IV SOLN
INTRAVENOUS | Status: DC | PRN
  Administered 2016-01-04: 5 mg via INTRAVENOUS

## 2016-01-04 MED ORDER — MIDAZOLAM HCL 2 MG/2ML IJ SOLN
INTRAMUSCULAR | Status: AC
  Filled 2016-01-04: qty 2

## 2016-01-04 MED ORDER — LISINOPRIL 20 MG PO TABS
20.0000 mg | ORAL_TABLET | Freq: Every day | ORAL | Status: DC
  Administered 2016-01-05 – 2016-01-10 (×6): 20 mg via ORAL
  Filled 2016-01-04 (×6): qty 1

## 2016-01-04 MED ORDER — LIDOCAINE HCL (CARDIAC) 20 MG/ML IV SOLN
INTRAVENOUS | Status: DC | PRN
  Administered 2016-01-04: 80 mg via INTRAVENOUS

## 2016-01-04 MED ORDER — SODIUM CHLORIDE 0.9 % IV SOLN: INTRAVENOUS | Status: DC

## 2016-01-04 MED ORDER — ONDANSETRON HCL 4 MG/2ML IJ SOLN
INTRAMUSCULAR | Status: AC
  Filled 2016-01-04: qty 2

## 2016-01-04 MED ORDER — LIDOCAINE HCL (CARDIAC) 20 MG/ML IV SOLN
INTRAVENOUS | Status: AC
  Filled 2016-01-04: qty 10

## 2016-01-04 MED ORDER — HYDROMORPHONE HCL 1 MG/ML IJ SOLN
0.2500 mg | INTRAMUSCULAR | Status: DC | PRN
  Administered 2016-01-04 (×2): 0.5 mg via INTRAVENOUS

## 2016-01-04 MED ORDER — EPHEDRINE SULFATE 50 MG/ML IJ SOLN
INTRAMUSCULAR | Status: AC
  Filled 2016-01-04: qty 1

## 2016-01-04 MED ORDER — ROCURONIUM BROMIDE 50 MG/5ML IV SOLN
INTRAVENOUS | Status: AC
  Filled 2016-01-04: qty 2

## 2016-01-04 MED ORDER — PROPOFOL 10 MG/ML IV BOLUS
INTRAVENOUS | Status: DC | PRN
  Administered 2016-01-04: 180 mg via INTRAVENOUS

## 2016-01-04 MED ORDER — HYDROMORPHONE HCL 1 MG/ML IJ SOLN
1.0000 mg | INTRAMUSCULAR | Status: DC | PRN
  Administered 2016-01-05: 1 mg via INTRAVENOUS
  Filled 2016-01-04 (×2): qty 1

## 2016-01-04 MED ORDER — GLYCOPYRROLATE 0.2 MG/ML IJ SOLN
INTRAMUSCULAR | Status: AC
  Filled 2016-01-04: qty 4

## 2016-01-04 MED ORDER — NEOSTIGMINE METHYLSULFATE 10 MG/10ML IV SOLN
INTRAVENOUS | Status: AC
  Filled 2016-01-04: qty 1

## 2016-01-04 MED ORDER — SODIUM CHLORIDE 0.9 % IV BOLUS (SEPSIS)
1000.0000 mL | Freq: Once | INTRAVENOUS | Status: AC
  Administered 2016-01-04: 1000 mL via INTRAVENOUS

## 2016-01-04 MED ORDER — FENTANYL CITRATE (PF) 100 MCG/2ML IJ SOLN
INTRAMUSCULAR | Status: DC | PRN
  Administered 2016-01-04: 50 ug via INTRAVENOUS
  Administered 2016-01-04: 150 ug via INTRAVENOUS
  Administered 2016-01-04 (×3): 50 ug via INTRAVENOUS

## 2016-01-04 MED ORDER — CEFAZOLIN SODIUM-DEXTROSE 2-3 GM-% IV SOLR: 2.0000 g | Freq: Three times a day (TID) | INTRAVENOUS | Status: DC

## 2016-01-04 MED ORDER — ONDANSETRON 4 MG PO TBDP: 4.0000 mg | ORAL_TABLET | Freq: Four times a day (QID) | ORAL | Status: DC | PRN

## 2016-01-04 MED ORDER — SUCCINYLCHOLINE CHLORIDE 20 MG/ML IJ SOLN
INTRAMUSCULAR | Status: DC | PRN
  Administered 2016-01-04: 120 mg via INTRAVENOUS

## 2016-01-04 MED ORDER — ONDANSETRON HCL 4 MG/2ML IJ SOLN: 4.0000 mg | Freq: Four times a day (QID) | INTRAMUSCULAR | Status: DC | PRN

## 2016-01-04 MED ORDER — METHOCARBAMOL 500 MG PO TABS: 500.0000 mg | ORAL_TABLET | Freq: Four times a day (QID) | ORAL | Status: DC | PRN

## 2016-01-04 MED ORDER — MEPERIDINE HCL 25 MG/ML IJ SOLN: 6.2500 mg | INTRAMUSCULAR | Status: DC | PRN

## 2016-01-04 MED ORDER — PHENYLEPHRINE HCL 10 MG/ML IJ SOLN
INTRAMUSCULAR | Status: DC | PRN
  Administered 2016-01-04: 80 ug via INTRAVENOUS
  Administered 2016-01-04: 40 ug via INTRAVENOUS
  Administered 2016-01-04: 80 ug via INTRAVENOUS

## 2016-01-04 MED ORDER — INSULIN ASPART 100 UNIT/ML ~~LOC~~ SOLN
0.0000 [IU] | SUBCUTANEOUS | Status: DC
  Administered 2016-01-04 (×2): 7 [IU] via SUBCUTANEOUS
  Administered 2016-01-05 (×3): 4 [IU] via SUBCUTANEOUS
  Administered 2016-01-05 (×2): 3 [IU] via SUBCUTANEOUS
  Administered 2016-01-05: 4 [IU] via SUBCUTANEOUS
  Administered 2016-01-06: 3 [IU] via SUBCUTANEOUS
  Administered 2016-01-06: 4 [IU] via SUBCUTANEOUS
  Administered 2016-01-06 (×2): 3 [IU] via SUBCUTANEOUS
  Administered 2016-01-06: 4 [IU] via SUBCUTANEOUS
  Administered 2016-01-07 (×3): 3 [IU] via SUBCUTANEOUS
  Administered 2016-01-08 (×2): 4 [IU] via SUBCUTANEOUS
  Administered 2016-01-08: 3 [IU] via SUBCUTANEOUS
  Administered 2016-01-09: 4 [IU] via SUBCUTANEOUS
  Administered 2016-01-09: 3 [IU] via SUBCUTANEOUS
  Administered 2016-01-09: 4 [IU] via SUBCUTANEOUS
  Administered 2016-01-10 (×2): 3 [IU] via SUBCUTANEOUS

## 2016-01-04 MED ORDER — FLUTICASONE PROPIONATE 50 MCG/ACT NA SUSP
2.0000 | Freq: Every day | NASAL | Status: DC
Start: 2016-01-05 — End: 2016-01-10
  Administered 2016-01-05 – 2016-01-10 (×6): 2 via NASAL
  Filled 2016-01-04 (×2): qty 16

## 2016-01-04 MED ORDER — ROCURONIUM BROMIDE 100 MG/10ML IV SOLN
INTRAVENOUS | Status: DC | PRN
  Administered 2016-01-04: 10 mg via INTRAVENOUS
  Administered 2016-01-04: 20 mg via INTRAVENOUS
  Administered 2016-01-04: 10 mg via INTRAVENOUS
  Administered 2016-01-04: 30 mg via INTRAVENOUS
  Administered 2016-01-04 (×3): 10 mg via INTRAVENOUS

## 2016-01-04 MED ORDER — MIDAZOLAM HCL 5 MG/5ML IJ SOLN
INTRAMUSCULAR | Status: DC | PRN
  Administered 2016-01-04: 2 mg via INTRAVENOUS

## 2016-01-04 MED ORDER — BUPIVACAINE-EPINEPHRINE (PF) 0.25% -1:200000 IJ SOLN
INTRAMUSCULAR | Status: AC
  Filled 2016-01-04: qty 30

## 2016-01-04 MED ORDER — PROPOFOL 10 MG/ML IV BOLUS
INTRAVENOUS | Status: AC
  Filled 2016-01-04: qty 20

## 2016-01-04 MED ORDER — POTASSIUM CHLORIDE IN NACL 20-0.9 MEQ/L-% IV SOLN
INTRAVENOUS | Status: DC
  Administered 2016-01-04 – 2016-01-09 (×11): via INTRAVENOUS
  Filled 2016-01-04 (×15): qty 1000

## 2016-01-04 MED ORDER — ARTIFICIAL TEARS OP OINT
TOPICAL_OINTMENT | OPHTHALMIC | Status: AC
  Filled 2016-01-04: qty 7

## 2016-01-04 MED ORDER — ONDANSETRON HCL 4 MG/2ML IJ SOLN
INTRAMUSCULAR | Status: DC | PRN
  Administered 2016-01-04: 4 mg via INTRAVENOUS

## 2016-01-04 MED ORDER — HYDROMORPHONE HCL 1 MG/ML IJ SOLN
INTRAMUSCULAR | Status: AC
  Administered 2016-01-04: 0.5 mg via INTRAVENOUS
  Filled 2016-01-04: qty 1

## 2016-01-04 MED ORDER — POLYMYXIN B SULFATE 500000 UNITS IJ SOLR
INTRAMUSCULAR | Status: DC | PRN
  Administered 2016-01-04: 500 mL

## 2016-01-04 MED ORDER — PHENYLEPHRINE 40 MCG/ML (10ML) SYRINGE FOR IV PUSH (FOR BLOOD PRESSURE SUPPORT)
PREFILLED_SYRINGE | INTRAVENOUS | Status: AC
  Filled 2016-01-04: qty 20

## 2016-01-04 MED ORDER — GLYCOPYRROLATE 0.2 MG/ML IJ SOLN
INTRAMUSCULAR | Status: DC | PRN
  Administered 2016-01-04: .8 mg via INTRAVENOUS

## 2016-01-04 MED ORDER — PANTOPRAZOLE SODIUM 40 MG IV SOLR
40.0000 mg | Freq: Every day | INTRAVENOUS | Status: DC
  Administered 2016-01-04 – 2016-01-07 (×4): 40 mg via INTRAVENOUS
  Filled 2016-01-04 (×4): qty 40

## 2016-01-04 MED ORDER — LACTATED RINGERS IV SOLN
INTRAVENOUS | Status: DC
  Administered 2016-01-04 (×3): via INTRAVENOUS

## 2016-01-04 MED ORDER — 0.9 % SODIUM CHLORIDE (POUR BTL) OPTIME
TOPICAL | Status: DC | PRN
  Administered 2016-01-04 (×2): 1000 mL

## 2016-01-04 SURGICAL SUPPLY — 68 items
BAG DECANTER FOR FLEXI CONT (MISCELLANEOUS) IMPLANT
BINDER ABD UNIV 12 45-62 (WOUND CARE) ×1 IMPLANT
BINDER ABDOMINAL 46IN 62IN (WOUND CARE) ×3
BIOPATCH RED 1 DISK 7.0 (GAUZE/BANDAGES/DRESSINGS) ×4 IMPLANT
BIOPATCH RED 1IN DISK 7.0MM (GAUZE/BANDAGES/DRESSINGS) ×2
BLADE SURG ROTATE 9660 (MISCELLANEOUS) IMPLANT
CANISTER SUCTION 2500CC (MISCELLANEOUS) ×3 IMPLANT
CHLORAPREP W/TINT 26ML (MISCELLANEOUS) ×3 IMPLANT
COVER SURGICAL LIGHT HANDLE (MISCELLANEOUS) ×3 IMPLANT
DRAIN CHANNEL 19F RND (DRAIN) ×6 IMPLANT
DRAPE LAPAROSCOPIC ABDOMINAL (DRAPES) ×3 IMPLANT
DRAPE WARM FLUID 44X44 (DRAPE) ×3 IMPLANT
DRSG OPSITE POSTOP 4X10 (GAUZE/BANDAGES/DRESSINGS) IMPLANT
DRSG OPSITE POSTOP 4X8 (GAUZE/BANDAGES/DRESSINGS) IMPLANT
DRSG PAD ABDOMINAL 8X10 ST (GAUZE/BANDAGES/DRESSINGS) ×12 IMPLANT
DRSG TEGADERM 2-3/8X2-3/4 SM (GAUZE/BANDAGES/DRESSINGS) ×6 IMPLANT
ELECT BLADE 6.5 EXT (BLADE) IMPLANT
ELECT CAUTERY BLADE 6.4 (BLADE) ×3 IMPLANT
ELECT REM PT RETURN 9FT ADLT (ELECTROSURGICAL) ×3
ELECTRODE REM PT RTRN 9FT ADLT (ELECTROSURGICAL) ×1 IMPLANT
EVACUATOR SILICONE 100CC (DRAIN) ×6 IMPLANT
GAUZE SPONGE 4X4 12PLY STRL (GAUZE/BANDAGES/DRESSINGS) ×3 IMPLANT
GLOVE BIOGEL PI IND STRL 7.0 (GLOVE) ×4 IMPLANT
GLOVE BIOGEL PI IND STRL 7.5 (GLOVE) ×1 IMPLANT
GLOVE BIOGEL PI INDICATOR 7.0 (GLOVE) ×8
GLOVE BIOGEL PI INDICATOR 7.5 (GLOVE) ×2
GLOVE EUDERMIC 7 POWDERFREE (GLOVE) IMPLANT
GLOVE SURG SS PI 6.5 STRL IVOR (GLOVE) ×6 IMPLANT
GLOVE SURG SS PI 7.0 STRL IVOR (GLOVE) ×18 IMPLANT
GOWN STRL REUS W/ TWL LRG LVL3 (GOWN DISPOSABLE) ×1 IMPLANT
GOWN STRL REUS W/ TWL XL LVL3 (GOWN DISPOSABLE) ×1 IMPLANT
GOWN STRL REUS W/TWL LRG LVL3 (GOWN DISPOSABLE) ×2
GOWN STRL REUS W/TWL XL LVL3 (GOWN DISPOSABLE) ×2
KIT BASIN OR (CUSTOM PROCEDURE TRAY) ×3 IMPLANT
KIT ROOM TURNOVER OR (KITS) ×3 IMPLANT
LIGASURE IMPACT 36 18CM CVD LR (INSTRUMENTS) IMPLANT
MESH HERNIA 10X14 SHEET (Mesh General) ×3 IMPLANT
NEEDLE HYPO 25GX1X1/2 BEV (NEEDLE) IMPLANT
NS IRRIG 1000ML POUR BTL (IV SOLUTION) ×6 IMPLANT
PACK GENERAL/GYN (CUSTOM PROCEDURE TRAY) ×3 IMPLANT
PAD ARMBOARD 7.5X6 YLW CONV (MISCELLANEOUS) ×3 IMPLANT
RETAINER VISCERA MED (MISCELLANEOUS) ×3 IMPLANT
SPECIMEN JAR LARGE (MISCELLANEOUS) IMPLANT
SPONGE LAP 18X18 X RAY DECT (DISPOSABLE) ×12 IMPLANT
STAPLER VISISTAT 35W (STAPLE) ×6 IMPLANT
SUCTION POOLE TIP (SUCTIONS) ×3 IMPLANT
SUT ETHILON 3 0 FSL (SUTURE) ×6 IMPLANT
SUT NOVA 0 T20/GS 25DT 4464 63 (SUTURE) IMPLANT
SUT NOVA 1 T20/GS 25DT (SUTURE) ×21 IMPLANT
SUT NOVA NAB DX-16 0-1 5-0 T12 (SUTURE) ×6 IMPLANT
SUT PDS AB 1 TP1 96 (SUTURE) IMPLANT
SUT SILK 2 0 SH CR/8 (SUTURE) IMPLANT
SUT SILK 2 0 TIES 10X30 (SUTURE) IMPLANT
SUT SILK 3 0 SH CR/8 (SUTURE) ×3 IMPLANT
SUT SILK 3 0 TIES 10X30 (SUTURE) IMPLANT
SUT VIC AB 2-0 CT1 18 (SUTURE) ×6 IMPLANT
SUT VIC AB 2-0 CT1 27 (SUTURE)
SUT VIC AB 2-0 CT1 TAPERPNT 27 (SUTURE) IMPLANT
SUT VIC AB 3-0 CT1 27 (SUTURE)
SUT VIC AB 3-0 CT1 TAPERPNT 27 (SUTURE) IMPLANT
SUT VIC AB 3-0 SH 18 (SUTURE) ×3 IMPLANT
SYR CONTROL 10ML LL (SYRINGE) IMPLANT
TOWEL OR 17X24 6PK STRL BLUE (TOWEL DISPOSABLE) ×3 IMPLANT
TOWEL OR 17X26 10 PK STRL BLUE (TOWEL DISPOSABLE) ×3 IMPLANT
TRAY FOLEY BAG SILVER LF 16FR (SET/KITS/TRAYS/PACK) ×3 IMPLANT
TRAY FOLEY CATH 16FR SILVER (SET/KITS/TRAYS/PACK) IMPLANT
TRAY FOLEY CATH 16FRSI W/METER (SET/KITS/TRAYS/PACK) IMPLANT
YANKAUER SUCT BULB TIP NO VENT (SUCTIONS) IMPLANT

## 2016-01-04 NOTE — Progress Notes (Signed)
Interpreter Lesle Chris short stay for Pre-surgery

## 2016-01-04 NOTE — Anesthesia Postprocedure Evaluation (Signed)
Anesthesia Post Note  Patient: Meredith Maynard  Procedure(s) Performed: Procedure(s) (LRB): EXPLORATORY LAPAROTOMY AND LYSIS OF ADHESIONS  (N/A) RECURRENT INCISIONAL HERNIA (N/A) INSERTION OF MESH (N/A)  Patient location during evaluation: PACU Anesthesia Type: General Level of consciousness: awake and alert and oriented Pain management: pain level controlled Vital Signs Assessment: post-procedure vital signs reviewed and stable Respiratory status: respiratory function stable, nonlabored ventilation, spontaneous breathing and patient connected to nasal cannula oxygen Cardiovascular status: blood pressure returned to baseline and stable Postop Assessment: no signs of nausea or vomiting Anesthetic complications: no    Last Vitals:  Filed Vitals:   01/04/16 1243 01/04/16 1258  BP: 137/64 124/66  Pulse: 61 63  Temp:    Resp: 22 22    Last Pain:  Filed Vitals:   01/04/16 1306  PainSc: 4                  Keiran Sias A.

## 2016-01-04 NOTE — H&P (Addendum)
Meredith Maynard is an 50 y.o. female.   Chief Complaint: incarcerated incisional hernia HPI: This is a 50 year old Hispanic female who does not speak English who presents with an incarcerated incisional hernia. Interpretation was done with her daughter by phone. She presents with a three-day history of abdominal pain, nausea, and vomiting. They reported the hernia had recurred several years ago but that she has never had symptoms like those of presentation. Pain is described as cramping. She has passed some flatus. Upon presentation, a CAT scan of the abdomen and pelvis was performed showing the incarcerated incisional hernia that is creating a bowel obstruction. She is otherwise without complaints.  Past Medical History  Diagnosis Date  . Anxiety   . Panic attacks   . Meniere syndrome   . T2DM (type 2 diabetes mellitus) ([place])   . GERD (gastroesophageal reflux disease)   . HLD (hyperlipidemia)   . Somatic complaints, multiple   . Hypertension   . Fatty liver   . Gastritis     Past Surgical History  Procedure Laterality Date  . Umbilical hernia repair  2007    Dr. Hulen Skains  . Small intestine surgery  2007    Dr. Hulen Skains  . Cesarean section  2000,2004,2005    x3  . Exercise treadmill test  2010    Normal, poor exercise tolerance  . Tubal ligation    . Abdominal u/s  2010    Other than fatty liver, normal    Family History  Problem Relation Age of Onset  . Hypertension Brother   . Stroke Brother 40  . Heart attack Brother 39   Social History:  reports that she has never smoked. She has never used smokeless tobacco. She reports that she does not drink alcohol or use illicit drugs.  Allergies:  Allergies  Allergen Reactions  . Latex Rash     (Not in a hospital admission)  Results for orders placed or performed during the hospital encounter of 01/04/16 (from the past 48 hour(s))  CBG monitoring, ED     Status: Abnormal   Collection Time: 01/03/16 10:38 PM  Result Value  Ref Range   Glucose-Capillary 263 (H) 65 - 99 mg/dL  Lipase, blood     Status: None   Collection Time: 01/03/16 11:09 PM  Result Value Ref Range   Lipase 29 11 - 51 U/L  Comprehensive metabolic panel     Status: Abnormal   Collection Time: 01/03/16 11:09 PM  Result Value Ref Range   Sodium 135 135 - 145 mmol/L   Potassium 4.5 3.5 - 5.1 mmol/L   Chloride 98 (L) 101 - 111 mmol/L   CO2 24 22 - 32 mmol/L   Glucose, Bld 268 (H) 65 - 99 mg/dL   BUN 16 6 - 20 mg/dL   Creatinine, Ser 0.61 0.44 - 1.00 mg/dL   Calcium 9.7 8.9 - 10.3 mg/dL   Total Protein 8.0 6.5 - 8.1 g/dL   Albumin 3.9 3.5 - 5.0 g/dL   AST 24 15 - 41 U/L   ALT 26 14 - 54 U/L   Alkaline Phosphatase 109 38 - 126 U/L   Total Bilirubin 1.1 0.3 - 1.2 mg/dL   GFR calc non Af Amer >60 >60 mL/min   GFR calc Af Amer >60 >60 mL/min    Comment: (NOTE) The eGFR has been calculated using the CKD EPI equation. This calculation has not been validated in all clinical situations. eGFR's persistently <60 mL/min signify possible Chronic Kidney Disease.  Anion gap 13 5 - 15  CBC     Status: Abnormal   Collection Time: 01/03/16 11:09 PM  Result Value Ref Range   WBC 11.3 (H) 4.0 - 10.5 K/uL   RBC 5.83 (H) 3.87 - 5.11 MIL/uL   Hemoglobin 12.8 12.0 - 15.0 g/dL   HCT 42.0 36.0 - 46.0 %   MCV 72.0 (L) 78.0 - 100.0 fL   MCH 22.0 (L) 26.0 - 34.0 pg   MCHC 30.5 30.0 - 36.0 g/dL   RDW 19.5 (H) 11.5 - 15.5 %   Platelets 232 150 - 400 K/uL  Urinalysis, Routine w reflex microscopic (not at [facility])     Status: Abnormal   Collection Time: 01/03/16 11:09 PM  Result Value Ref Range   Color, Urine YELLOW YELLOW   APPearance CLEAR CLEAR   Specific Gravity, Urine 1.030 1.005 - 1.030   pH 5.5 5.0 - 8.0   Glucose, UA >1000 (A) NEGATIVE mg/dL   Hgb urine dipstick NEGATIVE NEGATIVE   Bilirubin Urine NEGATIVE NEGATIVE   Ketones, ur >80 (A) NEGATIVE mg/dL   Protein, ur 30 (A) NEGATIVE mg/dL   Nitrite NEGATIVE NEGATIVE   Leukocytes, UA NEGATIVE  NEGATIVE  Urine microscopic-add on     Status: Abnormal   Collection Time: 01/03/16 11:09 PM  Result Value Ref Range   Squamous Epithelial / LPF 0-5 (A) NONE SEEN   WBC, UA 0-5 0 - 5 WBC/hpf   RBC / HPF NONE SEEN 0 - 5 RBC/hpf   Bacteria, UA RARE (A) NONE SEEN   Ct Abdomen Pelvis W Contrast  01/04/2016  CLINICAL DATA:  Generalized abdominal pain, nausea, vomiting, and diarrhea since Friday. EXAM: CT ABDOMEN AND PELVIS WITH CONTRAST TECHNIQUE: Multidetector CT imaging of the abdomen and pelvis was performed using the standard protocol following bolus administration of intravenous contrast. CONTRAST:  144m OMNIPAQUE IOHEXOL 300 MG/ML  SOLN COMPARISON:  11/19/2013 FINDINGS: The lung bases are clear. Diffuse fatty infiltration of the liver. Small nodule in the left adrenal gland is demonstrated measuring 1.2 cm diameter. No definite macroscopic fat. Statistically this is most likely an adenoma. Consider follow-up in 1 year with MRI or noncontrast CT. Gallbladder, pancreas, spleen, kidneys, abdominal aorta, inferior vena cava, and retroperitoneal lymph nodes are unremarkable. Stomach is decompressed. Scattered gas and stool in the colon without abnormal distention. There is a large midline anterior wall abdominal hernia containing small bowel. There is a small bowel anastomosis within the hernia. There is small bowel distention and fecalization with mild mesenteric edema. The terminal ileum is decompressed. Transition zone is within the hernia. Changes are consistent with small bowel obstruction likely due to hernia. No discrete bowel wall thickening. No pneumatosis. Pelvis: Uterus is not enlarged. Asymmetric prominence of right ovary at 2.5 x 4.6 cm. Circumscribed low-attenuation lesion in the left ovary measuring 3.4 cm. These changes likely represent functional cystic change. Surgical clips consistent with tubal ligations. Bladder wall is not thickened. No free or loculated pelvic fluid collections. No  pelvic mass or lymphadenopathy. No destructive bone lesions. IMPRESSION: Large ventral abdominal wall hernia containing multiple loops of small bowel. Changes of small bowel obstruction with transition zone within the hernia. Mesenteric edema. Electronically Signed   By: WLucienne CapersM.D.   On: 01/04/2016 04:42   Dg Abd 2 Views  01/04/2016  CLINICAL DATA:  Vomiting since Friday. History of gastritis. Abdominal pain. EXAM: ABDOMEN - 2 VIEW COMPARISON:  CT abdomen and pelvis 11/19/2013 FINDINGS: Gas and stool are demonstrated  throughout the colon. There are some mid abdominal small bowel loops which are mildly distended and a few air-fluid levels are demonstrated on the upright view suggesting obstruction. No free intra-abdominal air. No radiopaque stones. Mild degenerative changes in the spine with mild lumbar scoliosis convex towards the left. Surgical clips in the pelvis consistent with tubal ligations. IMPRESSION: Scattered gas-filled distended small bowel with air-fluid levels suggesting partial small bowel obstruction. Electronically Signed   By: Lucienne Capers M.D.   On: 01/04/2016 02:42    Review of Systems  All other systems reviewed and are negative.   Blood pressure 132/68, pulse 71, temperature 98.5 F (36.9 C), temperature source Oral, resp. rate 28, height 5' 5"  (1.651 m), weight 106.142 kg (234 lb), last menstrual period 12/10/2015, SpO2 100 %. Physical Exam  Constitutional: She is oriented to person, place, and time. She appears well-developed and well-nourished. No distress.  Obese  HENT:  Head: Normocephalic and atraumatic.  Right Ear: External ear normal.  Left Ear: External ear normal.  Nose: Nose normal.  Mouth/Throat: Oropharynx is clear and moist.  Eyes: Conjunctivae are normal. Pupils are equal, round, and reactive to light. Right eye exhibits no discharge. Left eye exhibits no discharge. No scleral icterus.  Neck: Normal range of motion. Neck supple. No tracheal  deviation present.  Cardiovascular: Normal rate, regular rhythm, normal heart sounds and intact distal pulses.   No murmur heard. Respiratory: Effort normal and breath sounds normal. No respiratory distress. She has no wheezes.  GI: She exhibits distension. There is tenderness. There is guarding.  Abdomen is obese. There is a large, incarcerated, mildly tender incisional hernia  Musculoskeletal: Normal range of motion. She exhibits no edema or tenderness.  Neurological: She is alert and oriented to person, place, and time.  Skin: Skin is warm and dry. She is not diaphoretic. No erythema.  Psychiatric: Her behavior is normal.     Assessment/Plan Incarcerated incisional hernia with obstruction  The CT scan demonstrates that there is a small bowel obstruction being created by the incisional hernia with multiple loops of bowel involvement. There is obstruction with a transition zone into the hernia and mesenteric edema. Given this, urgent repair of the hernia will be necessary. This will be discussed further with our surgeon who is taking over this morning.  Harl Bowie, MD 01/04/2016, 6:28 AM

## 2016-01-04 NOTE — Transfer of Care (Signed)
Immediate Anesthesia Transfer of Care Note  Patient: Meredith Maynard  Procedure(s) Performed: Procedure(s) with comments: EXPLORATORY LAPAROTOMY AND LYSIS OF ADHESIONS  (N/A) RECURRENT INCISIONAL HERNIA (N/A) INSERTION OF MESH (N/A) - PHASIX MESH  Patient Location: PACU  Anesthesia Type:General  Level of Consciousness: awake, alert  and oriented  Airway & Oxygen Therapy: Patient Spontanous Breathing and Patient connected to face mask oxygen  Post-op Assessment: Report given to RN, Post -op Vital signs reviewed and stable and Patient moving all extremities X 4  Post vital signs: Reviewed and stable  Last Vitals:  Filed Vitals:   01/04/16 0624 01/04/16 0800  BP: 132/68 125/72  Pulse: 71 79  Temp: 36.9 C   Resp: 28 14    Complications: No apparent anesthesia complications

## 2016-01-04 NOTE — Care Management Note (Signed)
Case Management Note  Patient Details  Name: Meredith Maynard MRN: [id number] Date of Birth: 24-Dec-1965  Subjective/Objective:                    Action/Plan:  Initial UR completed . Will continue to follow . Can assist with med on day of discharge call Case Manager . Thanks Magdalen Spatz RN BSN [phone]  Expected Discharge Date:                  Expected Discharge Plan:  Home/Self Care  In-House Referral:     Discharge [facility], [facility] Program, Medication Assistance  Post Acute Care Choice:    Choice offered to:     DME Arranged:    DME Agency:     HH Arranged:    HH Agency:     Status of Service:  In process, will continue to follow  Medicare Important Message Given:    Date Medicare IM Given:    Medicare IM give by:    Date Additional Medicare IM Given:    Additional Medicare Important Message give by:     If discussed at [place] of Stay Meetings, dates discussed:    Additional Comments:  Marilu Favre, RN 01/04/2016, 3:40 PM

## 2016-01-04 NOTE — Progress Notes (Signed)
Gen. Surgery:  I have interviewed and examined this patient this morning.  Family, nursing staff, and a professional Hispanic interpreter on the phone was used to take history perform the exam and provide informed consent.  It sounds like she has had 5 pregnancies  and deliveries, possibly some with C-section.  She said 2 or 3 abdominal surgeries, the most recent being in 2007 by Dr. Hulen Skains requiring small bowel resection for obstruction.  Comorbidities include non-insulin-dependent diabetes, hyperlipidemia, hypertension and obesity.  She has had abdominal pain and vomiting for 3 days.  No stool for 3 days.  On exam her abdomen is somewhat distended with obviously incarcerated ventral hernia.  The tissues are soft and I do not think she has peritonitis at this time.  Her CT scan shows large ventral hernia, smaller defect.  Possibly more than one defect.  Some bowel edema but no signs of perforation or abscess. [place] 11,300.  Glucose 268.  Assessment/plan:  Incarcerated recurrent incisional hernia with obstruction. Urgent repair of this hernia is indicated, and I have advised her to have this surgery today. I discussed the indications, details, techniques, and numerous risk of the surgery.  She's aware the risk of bleeding, infection, injury to adjacent organs requiring repair or resection, possible use of mesh, recurrence of the hernia, cardiac pulmonary and thromboembolic problems.  She understands all these issues.  All of her questions are answered.  She is in full agreement with this plan and is very cooperative. NG tube is planned. OR to be notified now.  DM-2 HTN Multiparity History laparotomy and small bowel resection for SBO 2007.  Details unknown Hyperlipidemia Obesity   Meredith Maynard M. Dalbert Batman, M.D., [facility] General and Minimally invasive Surgery Breast and Colorectal Surgery Office:   [phone] Pager:   [phone]

## 2016-01-04 NOTE — ED Provider Notes (Signed)
CSN: [id number]     Arrival date & time 01/03/16  2132 History  By signing my name below, I, Irene Pap, attest that this documentation has been prepared under the direction and in the presence of Davonna Belling, MD. Electronically Signed: Irene Pap, ED Scribe. 01/04/2016. Marland Kitchen    Chief Complaint  Patient presents with  . Abdominal Pain  . Nausea   The history is provided by the patient. No language interpreter was used.  HPI Comments: Alyzza Puck is a 50 y.o. Female with a hx of Meniere syndrome, hernia repair, Type II DM, GERD, HLD, HTN, and gastritis who presents to the Emergency Department complaining of generalized abdominal pain onset 3 days ago. Pt reports associated nausea, vomiting, and diarrhea. She reports worsening pain due to gas being in her stomach. Daughter states that she is able to take fluids but has not been able to keep down foods for the past 2 days. Pt states that she is hungry. She reports mild relief with Zofran given in ED. Pt is able to pass gas but has not had a BM. She denies fever or chills.   Past Medical History  Diagnosis Date  . Anxiety   . Panic attacks   . Meniere syndrome   . T2DM (type 2 diabetes mellitus) ([place])   . GERD (gastroesophageal reflux disease)   . HLD (hyperlipidemia)   . Somatic complaints, multiple   . Hypertension   . Fatty liver   . Gastritis    Past Surgical History  Procedure Laterality Date  . Umbilical hernia repair  2007    Dr. Hulen Skains  . Small intestine surgery  2007    Dr. Hulen Skains  . Cesarean section  2000,2004,2005    x3  . Exercise treadmill test  2010    Normal, poor exercise tolerance  . Tubal ligation    . Abdominal u/s  2010    Other than fatty liver, normal   Family History  Problem Relation Age of Onset  . Hypertension Brother   . Stroke Brother 40  . Heart attack Brother 61   Social History  Substance Use Topics  . Smoking status: Never Smoker   . Smokeless tobacco: Never Used  . Alcohol  Use: No   OB History    No data available     Review of Systems  Constitutional: Negative for fever and chills.  Gastrointestinal: Positive for nausea, vomiting, abdominal pain and diarrhea.  All other systems reviewed and are negative.  Allergies  Latex  Home Medications   Prior to Admission medications   Medication Sig Start Date End Date Taking? Authorizing Provider  aspirin EC 81 MG tablet Take 1 tablet (81 mg total) by mouth daily. 05/01/13  Yes Timmothy Euler, MD  famotidine (PEPCID) 20 MG tablet Take 1 tablet (20 mg total) by mouth 2 (two) times daily. Patient taking differently: Take 20 mg by mouth 2 (two) times daily as needed for heartburn or indigestion.  05/26/14  Yes Timmothy Euler, MD  ferrous sulfate 325 (65 FE) MG EC tablet Take 325 mg by mouth daily.   Yes Historical Provider, MD  fluticasone (FLONASE) 50 MCG/ACT nasal spray Place 2 sprays into both nostrils daily. Patient taking differently: Place 2 sprays into both nostrils daily as needed for allergies.  12/17/15  Yes Olam Idler, MD  ibuprofen (ADVIL,MOTRIN) 200 MG tablet Take 400 mg by mouth every 6 (six) hours as needed for moderate pain. For pain   Yes Historical  Provider, MD  lisinopril (PRINIVIL,ZESTRIL) 20 MG tablet Take 1 tablet (20 mg total) by mouth daily. 02/20/15  Yes Frazier Richards, MD  metFORMIN (GLUCOPHAGE) 1000 MG tablet Take 1 tablet (1,000 mg total) by mouth 2 (two) times daily with a meal. 02/20/15  Yes Frazier Richards, MD  Multiple Vitamins-Minerals (MULTIVITAMIN WITH MINERALS) tablet Take 1 tablet by mouth daily.   Yes Historical Provider, MD  omeprazole (PRILOSEC) 20 MG capsule Take 1 capsule (20 mg total) by mouth daily. Patient taking differently: Take 20 mg by mouth daily as needed (acid reflux).  11/27/13  Yes Kandis Nab, MD  ondansetron (ZOFRAN ODT) 4 MG disintegrating tablet 4mg  ODT q4 hours prn nausea/vomit 11/19/13  Yes Elnora Morrison, MD   BP 133/70 mmHg  Pulse 62  Temp(Src) 98.8  F (37.1 C) (Oral)  Resp 24  Ht 5\' 5"  (1.651 m)  Wt 234 lb (106.142 kg)  BMI 38.94 kg/m2  SpO2 98%  LMP 12/10/2015 Physical Exam  Constitutional: She is oriented to person, place, and time. She appears well-developed and well-nourished.  HENT:  Head: Normocephalic and atraumatic.  Eyes: EOM are normal. Pupils are equal, round, and reactive to light.  Neck: Normal range of motion. Neck supple.  Cardiovascular: Normal rate, regular rhythm and normal heart sounds.  Exam reveals no gallop and no friction rub.   No murmur heard. Pulmonary/Chest: Effort normal and breath sounds normal. She has no wheezes.  Abdominal: Soft. There is no tenderness.  Periumbilical midline well healing surgical scar; non reducible bilateral ventral hernia; no tenderness at the area  Musculoskeletal: Normal range of motion.  Neurological: She is alert and oriented to person, place, and time.  Skin: Skin is warm and dry.  Psychiatric: She has a normal mood and affect. Her behavior is normal.  Nursing note and vitals reviewed.   ED Course  Procedures (including critical care time) DIAGNOSTIC STUDIES: Oxygen Saturation is 99% on RA, normal by my interpretation.    COORDINATION OF CARE: 2:01 AM-Discussed treatment plan which includes labs and IV fluids with pt at bedside and pt agreed to plan.    Labs Review Labs Reviewed  COMPREHENSIVE METABOLIC PANEL - Abnormal; Notable for the following:    Chloride 98 (*)    Glucose, Bld 268 (*)    All other components within normal limits  CBC - Abnormal; Notable for the following:    WBC 11.3 (*)    RBC 5.83 (*)    MCV 72.0 (*)    MCH 22.0 (*)    RDW 19.5 (*)    All other components within normal limits  URINALYSIS, ROUTINE W REFLEX MICROSCOPIC (NOT AT [facility]) - Abnormal; Notable for the following:    Glucose, UA >1000 (*)    Ketones, ur >80 (*)    Protein, ur 30 (*)    All other components within normal limits  URINE MICROSCOPIC-ADD ON - Abnormal; Notable  for the following:    Squamous Epithelial / LPF 0-5 (*)    Bacteria, UA RARE (*)    All other components within normal limits  CBG MONITORING, ED - Abnormal; Notable for the following:    Glucose-Capillary 263 (*)    All other components within normal limits  LIPASE, BLOOD    Imaging Review Ct Abdomen Pelvis W Contrast  01/04/2016  CLINICAL DATA:  Generalized abdominal pain, nausea, vomiting, and diarrhea since Friday. EXAM: CT ABDOMEN AND PELVIS WITH CONTRAST TECHNIQUE: Multidetector CT imaging of the abdomen and pelvis was  performed using the standard protocol following bolus administration of intravenous contrast. CONTRAST:  135mL OMNIPAQUE IOHEXOL 300 MG/ML  SOLN COMPARISON:  11/19/2013 FINDINGS: The lung bases are clear. Diffuse fatty infiltration of the liver. Small nodule in the left adrenal gland is demonstrated measuring 1.2 cm diameter. No definite macroscopic fat. Statistically this is most likely an adenoma. Consider follow-up in 1 year with MRI or noncontrast CT. Gallbladder, pancreas, spleen, kidneys, abdominal aorta, inferior vena cava, and retroperitoneal lymph nodes are unremarkable. Stomach is decompressed. Scattered gas and stool in the colon without abnormal distention. There is a large midline anterior wall abdominal hernia containing small bowel. There is a small bowel anastomosis within the hernia. There is small bowel distention and fecalization with mild mesenteric edema. The terminal ileum is decompressed. Transition zone is within the hernia. Changes are consistent with small bowel obstruction likely due to hernia. No discrete bowel wall thickening. No pneumatosis. Pelvis: Uterus is not enlarged. Asymmetric prominence of right ovary at 2.5 x 4.6 cm. Circumscribed low-attenuation lesion in the left ovary measuring 3.4 cm. These changes likely represent functional cystic change. Surgical clips consistent with tubal ligations. Bladder wall is not thickened. No free or loculated  pelvic fluid collections. No pelvic mass or lymphadenopathy. No destructive bone lesions. IMPRESSION: Large ventral abdominal wall hernia containing multiple loops of small bowel. Changes of small bowel obstruction with transition zone within the hernia. Mesenteric edema. Electronically Signed   By: Lucienne Capers M.D.   On: 01/04/2016 04:42   Dg Abd 2 Views  01/04/2016  CLINICAL DATA:  Vomiting since Friday. History of gastritis. Abdominal pain. EXAM: ABDOMEN - 2 VIEW COMPARISON:  CT abdomen and pelvis 11/19/2013 FINDINGS: Gas and stool are demonstrated throughout the colon. There are some mid abdominal small bowel loops which are mildly distended and a few air-fluid levels are demonstrated on the upright view suggesting obstruction. No free intra-abdominal air. No radiopaque stones. Mild degenerative changes in the spine with mild lumbar scoliosis convex towards the left. Surgical clips in the pelvis consistent with tubal ligations. IMPRESSION: Scattered gas-filled distended small bowel with air-fluid levels suggesting partial small bowel obstruction. Electronically Signed   By: Lucienne Capers M.D.   On: 01/04/2016 02:42   I have personally reviewed and evaluated these images and lab results as part of my medical decision-making.   EKG Interpretation None      MDM   Final diagnoses:  Abdominal pain  Ventral hernia with obstruction and without gangrene    Patient with nausea and vomiting. Has chronic ventral hernia. Unable reduce and does have obstruction at the site. Has been passing gas but no bowel movement the last couple days. Discussed with general surgery, who will see the patient. I personally performed the services described in this documentation, which was scribed in my presence. The recorded information has been reviewed and is accurate.      Davonna Belling, MD 01/04/16 [id number]

## 2016-01-04 NOTE — Op Note (Addendum)
Patient Name:           Meredith Maynard   Date of Surgery:        01/04/2016  Pre op Diagnosis:      Recurrent incisional hernia, incarcerated, with small bowel obstruction  Post op Diagnosis:    Same, with devitalized  abdominal wall skin and subcutaneous tissue  Procedure:                 Exploratory laparotomy, lysis of adhesions, repair recurrent incisional hernia with Prolene mesh, debridement of skin and subcutaneous tissue of abdominal wall   1.  Progress note or procedure note with a detailed description of the procedure.  2.  Tool used for debridement (curette, scapel, etc.)  knife and electrocautery  3.  Frequency of surgical debridement.   Initial debridement  4.  Measurement of total devitalized tissue (wound surface) before and after surgical debridement.   Both sides of midline incision debrided, total debridement area 20 cm vertically by 15 cm transversely  5.  Area and depth of devitalized tissue removed from wound.  20 cm vertically by 15 cm transversely by 4 cm deep  6.  Blood loss and description of tissue removed.  Skin and subcutaneous tissue.  100 mL blood loss  7.  Evidence of the progress of the wound's response to treatment.  A.  Current wound volume (current dimensions and depth).  Initial debridement  B.  Presence (and extent of) of infection.  None currently  C.  Presence (and extent of) of non viable tissue.  None currently  D.  Other material in the wound that is expected to inhibit healing.  None currently  8.  Was there any viable tissue removed (measurements): No   Surgeon:                     Edsel Petrin. Dalbert Batman, M.D., FACS  Assistant:                      Gurney Maxin, M.D.  Operative Indications:   This is a 50 year old Hispanic female.  History of diabetes, obesity, hyperlipidemia.  She has multiparity.  She has had at least 2 laparotomies for abdominal wall hernia, the last in 2007 by Dr. Hulen Skains necessitating small bowel resection for  obstruction.  We have no records for that procedure but it appears that there was not any mesh used.  She presented to the emergency room in the middle of the night last night with a three-day history of abdominal pain and vomiting.  Imaging studies showed a large incisional hernia with multiple loops of small bowel present within it, including a transition point and a staple line from previous resection.  There is no evidence of perforation.  Operative Findings:       There was a multichambered chronic incisional hernia.  I ultimately reduced the small bowel from this.  The cecum was found in the upper midline but after taking down all the adhesions it was able to be replaced in the right lower quadrant.  All of the small bowel was viable and I examined from the ligament of Treitz to the ileocecal valve.  There did appear to be a transition point within the hernia sac.  The obstruction appeared to be due to adhesions.  Procedure in Detail:          Following the induction of general endotracheal anesthesia the patient had a Foley catheter and nasogastric tube placed.  The abdomen was prepped and draped in a sterile fashion.  Midline laparotomy incision was made.  We slowly took the dissection down to the subcutaneous tissue.  The hernia sac was right under the skin on both sides necessitating careful dissection, and ultimately the skin did not appear to have a good blood supply necessitating resection and debridement of skin and subcutaneous tissue as detailed above.     I entered the hernia sac.  There was a little bit of fluid but no ischemia or odor.  Slowly debrided the hernia sac circumferentially replacing the small bowel into the abdominal cavity.  I undermined the subcutaneous tissues tissue circumferentially.  I had to open the incision inferiorly and superiorly to gain better exposure.  There is a second small hernia in the right lower quadrant which I closed anteriorly with interrupted sutures of #1  Novafil.  The mesh ultimately covered all of the defects.  I examined the right colon and transverse colon they seemed fine.  I examined the small bowel from the ligament of Treitz ileocecal valve and other than being edematous it looked healthy.  We position the NG tube in the antrum of the stomach.  There was no omentum to bring down.        After debridement of the skin and subcutaneous tissue, I closed the fascia in the midline with interrupted sutures of #1 Novafil.  I brought a large piece of Prolene mesh to the operative field.  I cut a piece about 25 cm vertically by about 20 cm transversely and sewed it in as an onlay graft.  I tacked it to the midline with some of the tails of the previously placed fascial closure sutures.  I placed interrupted mattress sutures circumferentially, perhaps 20 such sutures were placed this provided very good security of the mesh as an anterior onlay.  All the tissues looked viable and there was no bleeding.  I placed two 19 French Blake drains in the wound and brought these out through separate stab incision superiorly, sutured to the skin and connected to suction bulbs.  Subcutaneous tissue was closed with interrupted 2-0 Vicryl and the skin closed with skin staples.  The wound looked good.  Clean bandages and an abdominal binder was placed.  Patient tolerated procedure well was taken to PACU in stable condition.  EBL 150 mL.  Counts correct.  Complications none.     Edsel Petrin. Dalbert Batman, M.D., FACS General and Minimally Invasive Surgery Breast and Colorectal Surgery  01/04/2016 12:15 PM

## 2016-01-04 NOTE — ED Notes (Addendum)
2 RN's Attempted to insert NG tube, pt uncooperative at present. Pt request NG tube be placed in OR when they put her to sleep.

## 2016-01-04 NOTE — Progress Notes (Signed)
Arrived to 6n26 arrived to room from PACU, family at bedside, oriented to room and surroundings, denies nausea/pain at this time

## 2016-01-04 NOTE — ED Notes (Signed)
Attempted to insert NG pt requested that this RN come back to do the procedure after pt makes a phone call. Pt informed to use call light when she completes the phone call.    Procedure and situation explained via interpreter phone.

## 2016-01-04 NOTE — Progress Notes (Signed)
Interpreter Graciela Namihira for PACU °

## 2016-01-04 NOTE — Anesthesia Preprocedure Evaluation (Addendum)
Anesthesia Evaluation  Patient identified by MRN, date of birth, ID band Patient awake    Reviewed: Allergy & Precautions, NPO status , Patient's Chart, lab work & pertinent test results  Airway Mallampati: I  TM Distance: >3 FB Neck ROM: Full    Dental no notable dental hx. (+) Teeth Intact   Pulmonary neg pulmonary ROS,    Pulmonary exam normal breath sounds clear to auscultation       Cardiovascular hypertension, Pt. on medications + Peripheral Vascular Disease  Normal cardiovascular exam Rhythm:Regular Rate:Normal     Neuro/Psych Anxiety  Neuromuscular disease    GI/Hepatic Neg liver ROS, GERD  Medicated and Controlled,Incarcerated incisional hernia with obstruction   Endo/Other  diabetes, Poorly Controlled, Type 2, Oral Hypoglycemic AgentsMorbid obesityHyperlipidemia   Renal/GU negative Renal ROS  negative genitourinary   Musculoskeletal negative musculoskeletal ROS (+)   Abdominal (+) + obese,  Abdomen: tender.    Peds  Hematology  (+) anemia ,   Anesthesia Other Findings   Reproductive/Obstetrics                           Anesthesia Physical Anesthesia Plan  ASA: III and emergent  Anesthesia Plan: General   Post-op Pain Management:    Induction: Intravenous  Airway Management Planned: Oral ETT  Additional Equipment:   Intra-op Plan:   Post-operative Plan: Extubation in OR  Informed Consent: I have reviewed the patients History and Physical, chart, labs and discussed the procedure including the risks, benefits and alternatives for the proposed anesthesia with the patient or authorized representative who has indicated his/her understanding and acceptance.     Plan Discussed with: CRNA, Anesthesiologist and Surgeon  Anesthesia Plan Comments:         Anesthesia Quick Evaluation

## 2016-01-04 NOTE — Anesthesia Procedure Notes (Signed)
Procedure Name: Intubation Date/Time: 01/04/2016 9:51 AM Performed by: Gaylene Brooks Pre-anesthesia Checklist: Patient identified, Timeout performed, Emergency Drugs available, Suction available and Patient being monitored Patient Re-evaluated:Patient Re-evaluated prior to inductionOxygen Delivery Method: Circle system utilized Preoxygenation: Pre-oxygenation with 100% oxygen Intubation Type: IV induction, Rapid sequence and Cricoid Pressure applied Laryngoscope Size: Miller and 2 Grade View: Grade I Tube type: Oral Tube size: 7.5 mm Number of attempts: 1 Airway Equipment and Method: Stylet Placement Confirmation: ETT inserted through vocal cords under direct vision,  breath sounds checked- equal and bilateral,  positive ETCO2 and CO2 detector Secured at: 21 cm Tube secured with: Tape Dental Injury: Teeth and Oropharynx as per pre-operative assessment

## 2016-01-05 ENCOUNTER — Encounter (HOSPITAL_COMMUNITY): Payer: Self-pay | Admitting: General Surgery

## 2016-01-05 ENCOUNTER — Ambulatory Visit: Payer: Self-pay

## 2016-01-05 LAB — CBC
HCT: 39.6 % (ref 36.0–46.0)
Hemoglobin: 12.5 g/dL (ref 12.0–15.0)
MCH: 23.1 pg — ABNORMAL LOW (ref 26.0–34.0)
MCHC: 31.6 g/dL (ref 30.0–36.0)
MCV: 73.3 fL — AB (ref 78.0–100.0)
PLATELETS: 216 10*3/uL (ref 150–400)
RBC: 5.4 MIL/uL — AB (ref 3.87–5.11)
RDW: 19.8 % — ABNORMAL HIGH (ref 11.5–15.5)
WBC: 11.3 10*3/uL — AB (ref 4.0–10.5)

## 2016-01-05 LAB — BASIC METABOLIC PANEL
ANION GAP: 10 (ref 5–15)
BUN: 11 mg/dL (ref 6–20)
CHLORIDE: 106 mmol/L (ref 101–111)
CO2: 24 mmol/L (ref 22–32)
CREATININE: 0.66 mg/dL (ref 0.44–1.00)
Calcium: 8.9 mg/dL (ref 8.9–10.3)
GFR calc non Af Amer: >60 mL/min (ref >60)
Glucose, Bld: 185 mg/dL — ABNORMAL HIGH (ref 65–99)
POTASSIUM: 4.1 mmol/L (ref 3.5–5.1)
SODIUM: 140 mmol/L (ref 135–145)

## 2016-01-05 LAB — GLUCOSE, CAPILLARY
GLUCOSE-CAPILLARY: 156 mg/dL — AB (ref 65–99)
GLUCOSE-CAPILLARY: 141 mg/dL — AB (ref 65–99)
Glucose-Capillary: 187 mg/dL — ABNORMAL HIGH (ref 65–99)
Glucose-Capillary: 162 mg/dL — ABNORMAL HIGH (ref 65–99)
Glucose-Capillary: 166 mg/dL — ABNORMAL HIGH (ref 65–99)
Glucose-Capillary: 143 mg/dL — ABNORMAL HIGH (ref 65–99)

## 2016-01-05 LAB — HEMOGLOBIN A1C
Hgb A1c MFr Bld: 10.5 % — ABNORMAL HIGH (ref 4.8–5.6)
Mean Plasma Glucose: 255 mg/dL

## 2016-01-05 MED ORDER — INSULIN GLARGINE 100 UNIT/ML ~~LOC~~ SOLN
20.0000 [IU] | Freq: Every day | SUBCUTANEOUS | Status: DC
  Administered 2016-01-05 – 2016-01-09 (×5): 20 [IU] via SUBCUTANEOUS
  Filled 2016-01-05 (×6): qty 0.2

## 2016-01-05 MED ORDER — PNEUMOCOCCAL VAC POLYVALENT 25 MCG/0.5ML IJ INJ
0.5000 mL | INJECTION | INTRAMUSCULAR | Status: AC
Start: 2016-01-06 — End: 2016-01-08
  Administered 2016-01-08: 0.5 mL via INTRAMUSCULAR
  Filled 2016-01-05: qty 0.5

## 2016-01-05 NOTE — Progress Notes (Signed)
Interpreter Lesle Chris for [employer]

## 2016-01-05 NOTE — Progress Notes (Addendum)
Inpatient Diabetes Program Recommendations  AACE/ADA: New Consensus Statement on Inpatient Glycemic Control (2015)  Target Ranges:  Prepandial:   less than 140 mg/dL      Peak postprandial:   less than 180 mg/dL (1-2 hours)      Critically ill patients:  140 - 180 mg/dL   Results for Meredith Maynard, Meredith Maynard (MRN [id number]) as of 01/05/2016 10:35  Ref. Range 01/04/2016 08:31 01/04/2016 12:30 01/04/2016 17:24 01/04/2016 19:47  Glucose-Capillary Latest Ref Range: 65-99 mg/dL 229 (H) 209 (H) 239 (H) 236 (H)   Results for Meredith Maynard, Meredith Maynard (MRN [id number]) as of 01/05/2016 10:35  Ref. Range 01/04/2016 16:26  Hemoglobin A1C Latest Ref Range: 4.8-5.6 % 10.5 (H)   Admit with: Incarcerated incisional hernia  History: DM2  Home DM Meds: Metformin 1000 mg bid  Current Insulin Orders: Lantus 20 units QHS      Novolog Resistant Correction Scale/ SSI (0-20 units) Q4 hours     MD- Note Lantus increased to 20 units QHS for tonight due to elevated glucose levels.  Patient's A1c of 10.5% shows poor glucose control at home prior to admission.   Patient needs follow-up with her PCP for better DM management.  Through Chart Review, it looks as if patient is established at the [facility].  If you decide to send patient home on insulin,she will need affordable insulin since she does not have health insurance.  Patient will likely not be able to afford Lantus.  MD- If you are thinking you may send pt home on insulin, please have nursing staff begin insulin education with patient asap.      --Will follow patient during hospitalization--  Wyn Quaker RN, MSN, CDE Diabetes Coordinator Inpatient Glycemic Control Team Team Pager: [phone] (8a-5p)

## 2016-01-05 NOTE — Progress Notes (Signed)
1 Day Post-Op  Subjective: Stable and alert.  SPO2 95% on room air.  Pain control adequate with Dilaudid. Good urine output Drainage 120 mL, serosanguineous Asking when the NG tube will be removed and I explained it when he to stay a day or 2.  Morning lab work pending. Objective: Vital signs in last 24 hours: Temp:  [98.4 F (36.9 C)-99.6 F (37.6 C)] 98.5 F (36.9 C) (03/07 0433) Pulse Rate:  [61-105] 79 (03/07 0433) Resp:  [14-26] 18 (03/07 0433) BP: (105-142)/(57-79) 119/67 mmHg (03/07 0433) SpO2:  [93 %-100 %] 95 % (03/07 0433) Weight:  [109.135 kg (240 lb 9.6 oz)] 109.135 kg (240 lb 9.6 oz) (03/06 1501)    Intake/Output from previous day: 03/06 0701 - 03/07 0700 In: 1300 [I.V.:1300] Out: 1045 [Urine:875; Drains:120] Intake/Output this shift: Total I/O In: -  Out: 630 [Urine:550; Drains:80]  General appearance: Alert.  Cooperative.  Mild distress.  Mental status normal.  Family present. Resp: clear to auscultation bilaterally GI: Soft.  Appropriately tender.  Wound clean.  Drainage serosanguineous.  Bowel sounds absent.  Lab Results:  Results for orders placed or performed during the hospital encounter of 01/04/16 (from the past 24 hour(s))  CBG monitoring, ED     Status: Abnormal   Collection Time: 01/04/16  8:31 AM  Result Value Ref Range   Glucose-Capillary 229 (H) 65 - 99 mg/dL  Glucose, capillary     Status: Abnormal   Collection Time: 01/04/16 12:30 PM  Result Value Ref Range   Glucose-Capillary 209 (H) 65 - 99 mg/dL   Comment 1 Notify RN    Comment 2 Document in Chart   Hemoglobin A1c     Status: Abnormal   Collection Time: 01/04/16  4:26 PM  Result Value Ref Range   Hgb A1c MFr Bld 10.5 (H) 4.8 - 5.6 %   Mean Plasma Glucose 255 mg/dL  Glucose, capillary     Status: Abnormal   Collection Time: 01/04/16  5:24 PM  Result Value Ref Range   Glucose-Capillary 239 (H) 65 - 99 mg/dL  Glucose, capillary     Status: Abnormal   Collection Time: 01/04/16  7:47  PM  Result Value Ref Range   Glucose-Capillary 236 (H) 65 - 99 mg/dL  Glucose, capillary     Status: Abnormal   Collection Time: 01/05/16 12:01 AM  Result Value Ref Range   Glucose-Capillary 187 (H) 65 - 99 mg/dL     Studies/Results: No results found.  Marland Kitchen  ceFAZolin (ANCEF) IV  2 g Intravenous Q8H  . enoxaparin (LOVENOX) injection  40 mg Subcutaneous Q24H  . fluticasone  2 spray Each Nare Daily  . insulin aspart  0-20 Units Subcutaneous 6 times per day  . insulin glargine  10 Units Subcutaneous QHS  . lisinopril  20 mg Oral Daily  . pantoprazole (PROTONIX) IV  40 mg Intravenous QHS     Assessment/Plan: s/p Procedure(s): EXPLORATORY LAPAROTOMY AND LYSIS OF ADHESIONS  RECURRENT INCISIONAL HERNIA INSERTION OF MESH  POD #1.  Repair recurrent incarcerated incisional hernia and lysis of adhesions for SBO Implantation of anterior onlay Prolene mesh Stable Lovenox for DVT prophylaxis Incentive spirometry Ambulate Continue NG.  Diabetes mellitus-2- CBGs 187-239. Goal is 120-180. Increase  Lantus insulin  Hypertension Multiparity Obesity Hyperlipidemia History laparotomy and small bowel resection for SBO 2007, details unknown.  @PROBHOSP @  LOS: 1 day    Fany Cavanaugh M 01/05/2016  . .prob

## 2016-01-06 LAB — BASIC METABOLIC PANEL
ANION GAP: 7 (ref 5–15)
BUN: 10 mg/dL (ref 6–20)
CO2: 23 mmol/L (ref 22–32)
Calcium: 8.6 mg/dL — ABNORMAL LOW (ref 8.9–10.3)
Chloride: 112 mmol/L — ABNORMAL HIGH (ref 101–111)
Creatinine, Ser: 0.64 mg/dL (ref 0.44–1.00)
GLUCOSE: 165 mg/dL — AB (ref 65–99)
POTASSIUM: 4.2 mmol/L (ref 3.5–5.1)
Sodium: 142 mmol/L (ref 135–145)

## 2016-01-06 LAB — GLUCOSE, CAPILLARY
GLUCOSE-CAPILLARY: 115 mg/dL — AB (ref 65–99)
GLUCOSE-CAPILLARY: 140 mg/dL — AB (ref 65–99)
GLUCOSE-CAPILLARY: 147 mg/dL — AB (ref 65–99)
GLUCOSE-CAPILLARY: 162 mg/dL — AB (ref 65–99)
Glucose-Capillary: 160 mg/dL — ABNORMAL HIGH (ref 65–99)
Glucose-Capillary: 121 mg/dL — ABNORMAL HIGH (ref 65–99)

## 2016-01-06 LAB — CBC
HEMATOCRIT: 34.6 % — AB (ref 36.0–46.0)
Hemoglobin: 10.6 g/dL — ABNORMAL LOW (ref 12.0–15.0)
MCH: 22.7 pg — ABNORMAL LOW (ref 26.0–34.0)
MCHC: 30.6 g/dL (ref 30.0–36.0)
MCV: 74.1 fL — AB (ref 78.0–100.0)
Platelets: 176 10*3/uL (ref 150–400)
RBC: 4.67 MIL/uL (ref 3.87–5.11)
RDW: 20.2 % — ABNORMAL HIGH (ref 11.5–15.5)
WBC: 9.6 10*3/uL (ref 4.0–10.5)

## 2016-01-06 MED ORDER — SALINE SPRAY 0.65 % NA SOLN
1.0000 | NASAL | Status: DC | PRN
  Filled 2016-01-06 (×2): qty 44

## 2016-01-06 NOTE — Progress Notes (Signed)
Patient ID: Meredith Maynard, female   DOB: [birth year], 50 y.o.   MRN: [id number]     [address], [place] [address]    Phone: [phone] FAX: [phone]     Subjective: CBGs are stable.  VSS.  Afebrile.  Ambulating in the hallways.  Little soreness. No flatus. 644m dark bilious output. LL! drain 691mserous RLQ drain 10055merosanguinous   Objective:  Vital signs:  Filed Vitals:   01/05/16 1745 01/05/16 2131 01/06/16 0550 01/06/16 0931  BP: 111/68 117/67 118/54 118/57  Pulse: 72 107 96 92  Temp: 97.5 F (36.4 C) 98.6 F (37 C) 98.1 F (36.7 C)   TempSrc: Oral Oral Oral   Resp: 18 18 17    Height:      Weight:      SpO2: 98% 98% 96%        Intake/Output   Yesterday:  03/07 0701 - 03/08 0700 In: 3129.2 [I.V.:3129.2] Out: 2287 [Ur[LFYBO:1751mesis/NG output:625; Drains:165] This shift:     Physical Exam: General: Pt awake/alert/oriented x4 in no acute distress Chest: cta.  No chest wall pain w good excursion CV:  Pulses intact.  Regular rhythm MS: Normal AROM mjr joints.  No obvious deformity Abdomen: Soft.  Nondistended.  Mildly tender at incisions only. Incision is c/d/i.  rlq drain with serosanguinous output.  llq drain with serous output.   No evidence of peritonitis.  No incarcerated hernias. Ext:  SCDs BLE.  No mjr edema.  No cyanosis Skin: No petechiae / purpura   Problem List:   Active Problems:   Recurrent ventral incisional hernia with obstruction    Results:   Labs: Results for orders placed or performed during the hospital encounter of 01/04/16 (from the past 48 hour(s))  Glucose, capillary     Status: Abnormal   Collection Time: 01/04/16 12:30 PM  Result Value Ref Range   Glucose-Capillary 209 (H) 65 - 99 mg/dL   Comment 1 Notify RN    Comment 2 Document in Chart   Hemoglobin A1c     Status: Abnormal   Collection Time: 01/04/16  4:26 PM  Result Value Ref  Range   Hgb A1c MFr Bld 10.5 (H) 4.8 - 5.6 %    Comment: (NOTE)         Pre-diabetes: 5.7 - 6.4         Diabetes: >6.4         Glycemic control for adults with diabetes: <7.0    Mean Plasma Glucose 255 mg/dL    Comment: (NOTE) Performed At: [id number] Ph:EU:[phone], capillary     Status: Abnormal   Collection Time: 01/04/16  5:24 PM  Result Value Ref Range   Glucose-Capillary 239 (H) 65 - 99 mg/dL  Glucose, capillary     Status: Abnormal   Collection Time: 01/04/16  7:47 PM  Result Value Ref Range   Glucose-Capillary 236 (H) 65 - 99 mg/dL  Glucose, capillary     Status: Abnormal   Collection Time: 01/05/16 12:01 AM  Result Value Ref Range   Glucose-Capillary 187 (H) 65 - 99 mg/dL  Glucose, capillary     Status: Abnormal   Collection Time: 01/05/16  4:42 AM  Result Value Ref Range   Glucose-Capillary 162 (H) 65 - 99 mg/dL  Basic metabolic panel     Status: Abnormal  Collection Time: 01/05/16  6:16 AM  Result Value Ref Range   Sodium 140 135 - 145 mmol/L   Potassium 4.1 3.5 - 5.1 mmol/L   Chloride 106 101 - 111 mmol/L   CO2 24 22 - 32 mmol/L   Glucose, Bld 185 (H) 65 - 99 mg/dL   BUN 11 6 - 20 mg/dL   Creatinine, Ser 0.66 0.44 - 1.00 mg/dL   Calcium 8.9 8.9 - 10.3 mg/dL   GFR calc non Af Amer >60 >60 mL/min   GFR calc Af Amer >60 >60 mL/min    Comment: (NOTE) The eGFR has been calculated using the CKD EPI equation. This calculation has not been validated in all clinical situations. eGFR's persistently <60 mL/min signify possible Chronic Kidney Disease.    Anion gap 10 5 - 15  CBC     Status: Abnormal   Collection Time: 01/05/16  6:16 AM  Result Value Ref Range   WBC 11.3 (H) 4.0 - 10.5 K/uL   RBC 5.40 (H) 3.87 - 5.11 MIL/uL   Hemoglobin 12.5 12.0 - 15.0 g/dL   HCT 39.6 36.0 - 46.0 %   MCV 73.3 (L) 78.0 - 100.0 fL   MCH 23.1 (L) 26.0 - 34.0 pg   MCHC 31.6 30.0 - 36.0 g/dL   RDW  19.8 (H) 11.5 - 15.5 %   Platelets 216 150 - 400 K/uL  Glucose, capillary     Status: Abnormal   Collection Time: 01/05/16  8:02 AM  Result Value Ref Range   Glucose-Capillary 156 (H) 65 - 99 mg/dL  Glucose, capillary     Status: Abnormal   Collection Time: 01/05/16 12:15 PM  Result Value Ref Range   Glucose-Capillary 141 (H) 65 - 99 mg/dL  Glucose, capillary     Status: Abnormal   Collection Time: 01/05/16  4:53 PM  Result Value Ref Range   Glucose-Capillary 166 (H) 65 - 99 mg/dL  Glucose, capillary     Status: Abnormal   Collection Time: 01/05/16  7:50 PM  Result Value Ref Range   Glucose-Capillary 143 (H) 65 - 99 mg/dL  Glucose, capillary     Status: Abnormal   Collection Time: 01/05/16 11:58 PM  Result Value Ref Range   Glucose-Capillary 140 (H) 65 - 99 mg/dL  Glucose, capillary     Status: Abnormal   Collection Time: 01/06/16  4:39 AM  Result Value Ref Range   Glucose-Capillary 147 (H) 65 - 99 mg/dL  CBC     Status: Abnormal   Collection Time: 01/06/16  5:30 AM  Result Value Ref Range   WBC 9.6 4.0 - 10.5 K/uL   RBC 4.67 3.87 - 5.11 MIL/uL   Hemoglobin 10.6 (L) 12.0 - 15.0 g/dL   HCT 34.6 (L) 36.0 - 46.0 %   MCV 74.1 (L) 78.0 - 100.0 fL   MCH 22.7 (L) 26.0 - 34.0 pg   MCHC 30.6 30.0 - 36.0 g/dL   RDW 20.2 (H) 11.5 - 15.5 %   Platelets 176 150 - 400 K/uL  Basic metabolic panel     Status: Abnormal   Collection Time: 01/06/16  5:30 AM  Result Value Ref Range   Sodium 142 135 - 145 mmol/L   Potassium 4.2 3.5 - 5.1 mmol/L   Chloride 112 (H) 101 - 111 mmol/L   CO2 23 22 - 32 mmol/L   Glucose, Bld 165 (H) 65 - 99 mg/dL   BUN 10 6 - 20 mg/dL   Creatinine, Ser 0.64  0.44 - 1.00 mg/dL   Calcium 8.6 (L) 8.9 - 10.3 mg/dL   GFR calc non Af Amer >60 >60 mL/min   GFR calc Af Amer >60 >60 mL/min    Comment: (NOTE) The eGFR has been calculated using the CKD EPI equation. This calculation has not been validated in all clinical situations. eGFR's persistently <60 mL/min  signify possible Chronic Kidney Disease.    Anion gap 7 5 - 15  Glucose, capillary     Status: Abnormal   Collection Time: 01/06/16  7:43 AM  Result Value Ref Range   Glucose-Capillary 162 (H) 65 - 99 mg/dL   Comment 1 Notify RN     Imaging / Studies: No results found.  Medications / Allergies:  Scheduled Meds: .  ceFAZolin (ANCEF) IV  2 g Intravenous Q8H  . enoxaparin (LOVENOX) injection  40 mg Subcutaneous Q24H  . fluticasone  2 spray Each Nare Daily  . insulin aspart  0-20 Units Subcutaneous 6 times per day  . insulin glargine  20 Units Subcutaneous QHS  . lisinopril  20 mg Oral Daily  . pantoprazole (PROTONIX) IV  40 mg Intravenous QHS  . pneumococcal 23 valent vaccine  0.5 mL Intramuscular Tomorrow-1000   Continuous Infusions: . 0.9 % NaCl with KCl 20 mEq / L 125 mL/hr at 01/06/16 0805   PRN Meds:.HYDROmorphone (DILAUDID) injection, methocarbamol, ondansetron **OR** ondansetron (ZOFRAN) IV  Antibiotics: Anti-infectives    Start     Dose/Rate Route Frequency Ordered Stop   01/04/16 1600  ceFAZolin (ANCEF) IVPB 2 g/50 mL premix    Comments:  Acute small bowel obstruction   2 g 100 mL/hr over 30 Minutes Intravenous Every 8 hours 01/04/16 1459     01/04/16 1500  ceFAZolin (ANCEF) IVPB 2 g/50 mL premix  Status:  Discontinued     2 g 100 mL/hr over 30 Minutes Intravenous 3 times per day 01/04/16 1459 01/04/16 1505   01/04/16 1136  polymyxin B 500,000 Units, bacitracin 50,000 Units in sodium chloride irrigation 0.9 % 500 mL irrigation  Status:  Discontinued       As needed 01/04/16 1206 01/04/16 1442   01/04/16 0845  ceFAZolin (ANCEF) IVPB 2 g/50 mL premix     2 g 100 mL/hr over 30 Minutes Intravenous On call to O.R. 01/04/16 0830 01/04/16 1000        Assessment/Plan Incarcerated hernia with obstruction POD#2 exploratory laparotomy, LOA, repair if recurrent incarcerated incisional hernia with mesh---Dr. Dalbert Batman  -continue NGT to LIWS, bowel rest and await bowel  function -mobilize and IS -abdominal binder -continue with drains ID-[id number] ?duration and indication  DM II-SSI CBGs, lantus  HTN-home meds  FEN-NPO, IVF VTE prophylaxis-SCD/lovenox Dispo-ileus    Erby Pian, ANP-BC [facility] Pager [phone](7A-4:30P) For consults and floor pages call [phone](7A-4:30P)  01/06/2016 10:47 AM

## 2016-01-07 LAB — GLUCOSE, CAPILLARY
GLUCOSE-CAPILLARY: 96 mg/dL (ref 65–99)
Glucose-Capillary: 92 mg/dL (ref 65–99)
Glucose-Capillary: 102 mg/dL — ABNORMAL HIGH (ref 65–99)
Glucose-Capillary: 133 mg/dL — ABNORMAL HIGH (ref 65–99)
Glucose-Capillary: 130 mg/dL — ABNORMAL HIGH (ref 65–99)
Glucose-Capillary: 137 mg/dL — ABNORMAL HIGH (ref 65–99)

## 2016-01-07 MED ORDER — ADULT MULTIVITAMIN W/MINERALS CH
1.0000 | ORAL_TABLET | Freq: Every day | ORAL | Status: DC
  Administered 2016-01-07 – 2016-01-10 (×4): 1 via ORAL
  Filled 2016-01-07 (×4): qty 1

## 2016-01-07 MED ORDER — INSULIN STARTER KIT- SYRINGES (SPANISH)
1.0000 | Freq: Once | Status: AC
  Administered 2016-01-07: 1
  Filled 2016-01-07: qty 1

## 2016-01-07 MED ORDER — LIVING WELL WITH DIABETES BOOK - IN SPANISH
Freq: Once | Status: AC
  Administered 2016-01-07: 23:00:00
  Filled 2016-01-07 (×2): qty 1

## 2016-01-07 NOTE — Care Management Note (Signed)
Case Management Note  Patient Details  Name: Meredith Maynard MRN: [id number] Date of Birth: 1965-12-01  Subjective/Objective:                    Action/Plan:  UR updated  Expected Discharge Date:                  Expected Discharge Plan:  Home/Self Care  In-House Referral:     Discharge [facility], [facility] Program, Medication Assistance  Post Acute Care Choice:    Choice offered to:     DME Arranged:    DME Agency:     HH Arranged:    HH Agency:     Status of Service:  In process, will continue to follow  Medicare Important Message Given:    Date Medicare IM Given:    Medicare IM give by:    Date Additional Medicare IM Given:    Additional Medicare Important Message give by:     If discussed at [place] of Stay Meetings, dates discussed:    Additional Comments:  Marilu Favre, RN 01/07/2016, 7:56 AM

## 2016-01-08 LAB — CBC
HEMATOCRIT: 30.4 % — AB (ref 36.0–46.0)
HEMOGLOBIN: 9 g/dL — AB (ref 12.0–15.0)
MCH: 21.9 pg — AB (ref 26.0–34.0)
MCHC: 29.6 g/dL — AB (ref 30.0–36.0)
MCV: 74 fL — AB (ref 78.0–100.0)
Platelets: 150 10*3/uL (ref 150–400)
RBC: 4.11 MIL/uL (ref 3.87–5.11)
RDW: 19.8 % — ABNORMAL HIGH (ref 11.5–15.5)
WBC: 5.3 10*3/uL (ref 4.0–10.5)

## 2016-01-08 LAB — BASIC METABOLIC PANEL
Anion gap: 9 (ref 5–15)
BUN: 6 mg/dL (ref 6–20)
CHLORIDE: 112 mmol/L — AB (ref 101–111)
CO2: 23 mmol/L (ref 22–32)
Calcium: 8.5 mg/dL — ABNORMAL LOW (ref 8.9–10.3)
Creatinine, Ser: 0.43 mg/dL — ABNORMAL LOW (ref 0.44–1.00)
GFR calc Af Amer: >60 mL/min (ref >60)
GFR calc non Af Amer: >60 mL/min (ref >60)
GLUCOSE: 109 mg/dL — AB (ref 65–99)
POTASSIUM: 3.7 mmol/L (ref 3.5–5.1)
SODIUM: 144 mmol/L (ref 135–145)

## 2016-01-08 LAB — GLUCOSE, CAPILLARY
GLUCOSE-CAPILLARY: 119 mg/dL — AB (ref 65–99)
GLUCOSE-CAPILLARY: 108 mg/dL — AB (ref 65–99)
GLUCOSE-CAPILLARY: 151 mg/dL — AB (ref 65–99)
GLUCOSE-CAPILLARY: 147 mg/dL — AB (ref 65–99)
Glucose-Capillary: 113 mg/dL — ABNORMAL HIGH (ref 65–99)
Glucose-Capillary: 175 mg/dL — ABNORMAL HIGH (ref 65–99)
Glucose-Capillary: 98 mg/dL (ref 65–99)

## 2016-01-08 MED ORDER — PANTOPRAZOLE SODIUM 40 MG PO TBEC
40.0000 mg | DELAYED_RELEASE_TABLET | Freq: Every day | ORAL | Status: DC
  Administered 2016-01-08 – 2016-01-10 (×3): 40 mg via ORAL
  Filled 2016-01-08 (×3): qty 1

## 2016-01-08 NOTE — Care Management Note (Signed)
Case Management Note  Patient Details  Name: Meredith Maynard MRN: [id number] Date of Birth: [birth year]  Subjective/Objective:                    Action/Plan: Possible discharge over weekend . MATCH given to patient through interpreter Lesle Chris .  MATCH will cover 14 days of Oxy IR , Percocet , or Vicodin .   Patient 's PCP is [facility]    Expected Discharge Date:                  Expected Discharge Plan:  Home/Self Care  In-House Referral:     Discharge planning Services  [place] Program, Medication Assistance  Post Acute Care Choice:    Choice offered to:     DME Arranged:    DME Agency:     HH Arranged:    [place] Agency:     Status of Service:  Completed, signed off  Medicare Important Message Given:    Date Medicare IM Given:    Medicare IM give by:    Date Additional Medicare IM Given:    Additional Medicare Important Message give by:     If discussed at [place] of Stay Meetings, dates discussed:    Additional Comments:  Marilu Favre, RN 01/08/2016, 2:53 PM

## 2016-01-08 NOTE — Progress Notes (Signed)
Spoke with patient and her daughter about diabetes and home regimen for diabetes control. Patient's daughter was able to translate for effective communication. Patient had had diabetes for about 11 years and she use to take insulin during her last pregnancy 7 years ago. Patient reports that she is followed by PCP for diabetes management and currently she takes Metformin 1000 mg BID as an outpatient for diabetes control. Patient reports that she was taking Metformin as prescribed but she has not been following a diabetic diet like she should.  Patient states that she checks her glucose 2-3 times per week and anytime she feels "weird". Patient reports that her glucose was in mid 100's to mid 200's mg/dl when she was checking it at home. Patient stated that she was not checking her glucose very often because the strips were expensive. Inquired about prior A1C and patient reports that her last A1C was 8% but they have always been less than 7% up until her last A1C which was done about 7 months ago.   Discussed A1C results (10.5% on 01/04/16) and explained that her current A1C indicates an average glucose of 255 mg/dl over the past 2-3 months. Discussed glucose and A1C goals. Discussed importance of checking CBGs and maintaining good CBG control to prevent long-term and short-term complications. Explained how hyperglycemia leads to damage within blood vessels which lead to the common complications seen with uncontrolled diabetes. Stressed to the patient the importance of improving glycemic control to prevent further complications from uncontrolled diabetes. Discussed impact of nutrition, exercise, stress, sickness, and medications on diabetes control. Patient reports that she was under a lot of stress with her last job and that is one of the reasons why she quit.  Patient states that she understands how to count carbohydrates and she knows that she should be following a carb modified diet but admits that she has not been  doing so for about 6 months. Patient stated that she knows she has to take better care of herself and she plans to make dietary changes and follow a carb modified diet.  Discussed carbohydrates, carbohydrate goals per day and meal, along with portion sizes. Patient stated that she has been to nutrition classes and she does not feel she needs to be seen by RD while inpatient. Patient reports that she is open to using insulin if she needs to once she is discharged. Talked with patient about Reli-On products at [facility] which are more affordable for diabetes management. Informed patient she could get a Reli-On glucometer at [facility] for $9 and a box of 50 test strips for $9. Also discussed NOVOLIN insulin products at [employer] (NOVOLIN N, NOVOLIN 70/30, and NOVOLIN R) which can be purchased at [facility] for $25 per vial.  Encouraged patient to get a more affordable glucometer with affordable testing supplies so that she can check her glucose more often. Encouraged patient to check her glucose 2-4 times per day (before meals and at bedtime) and to keep a log book of glucose readings which she will need to take to doctor appointments. Explained how the doctor can use the log book to continue to determine if DM medication adjustments are needed. Patient verbalized understanding of information discussed and she states that she has no further questions at this time related to diabetes.  If patient will be discharged on insulin, please prescribe NOVOLIN (70/30, N, or R) which patient can purchase from [facility] for $25 per vial. If patient continues to be controlled on current inpatient insulin  regimen, may want to consider ordering NOVOLIN 70/30 13 units BID (which will provide 18 units for basal and 8 units for meal coverage per day).    Thanks, Barnie Alderman, RN, MSN, CDE Diabetes Coordinator Inpatient Diabetes Program [phone] (Team Pager) [phone] ([facility] office) [phone] [facility] office) [phone] [facility]  office)

## 2016-01-08 NOTE — Progress Notes (Signed)
Interpreter Lesle Chris for [facility].

## 2016-01-08 NOTE — Progress Notes (Signed)
4 Days Post-Op  Subjective: Stable and alert.  Afebrile.  Heart rate 78.  SPO2 97% Had a bowel movement.  No nausea.  Tolerating sips of clear liquids Morning lab work pending  Objective: Vital signs in last 24 hours: Temp:  [98 F (36.7 C)-99.2 F (37.3 C)] 98.3 F (36.8 C) (03/10 0538) Pulse Rate:  [78-93] 78 (03/10 0538) Resp:  [18] 18 (03/10 0538) BP: (119-133)/(64-79) 125/64 mmHg (03/10 0538) SpO2:  [95 %-99 %] 97 % (03/10 0538)    Intake/Output from previous day: 03/09 0701 - 03/10 0700 In: 2645 [P.O.:1115; I.V.:1500; NG/GT:30] Out: 890 [Urine:150; Emesis/NG output:650; Drains:90] Intake/Output this shift: Total I/O In: 275 [P.O.:275] Out: 30 [Drains:30]  General appearance: Alert.  Minimal distress. Resp: clear to auscultation bilaterally GI: Soft.  Obese.  Doesn't seem distended.  Hypoactive bowel sounds.  Midline wound looks good.  Drainage serosanguineous.  Fluid in NG canister is fairly thin.  Lab Results:  Results for orders placed or performed during the hospital encounter of 01/04/16 (from the past 24 hour(s))  Glucose, capillary     Status: Abnormal   Collection Time: 01/07/16  8:16 AM  Result Value Ref Range   Glucose-Capillary 102 (H) 65 - 99 mg/dL  Glucose, capillary     Status: Abnormal   Collection Time: 01/07/16 12:45 PM  Result Value Ref Range   Glucose-Capillary 133 (H) 65 - 99 mg/dL  Glucose, capillary     Status: Abnormal   Collection Time: 01/07/16  4:15 PM  Result Value Ref Range   Glucose-Capillary 130 (H) 65 - 99 mg/dL  Glucose, capillary     Status: Abnormal   Collection Time: 01/07/16  8:10 PM  Result Value Ref Range   Glucose-Capillary 137 (H) 65 - 99 mg/dL   Comment 1 Notify RN   Glucose, capillary     Status: Abnormal   Collection Time: 01/07/16 11:17 PM  Result Value Ref Range   Glucose-Capillary 119 (H) 65 - 99 mg/dL  Glucose, capillary     Status: None   Collection Time: 01/08/16  5:36 AM  Result Value Ref Range   Glucose-Capillary 98 65 - 99 mg/dL   Comment 1 Notify RN      Studies/Results: No results found.  . enoxaparin (LOVENOX) injection  40 mg Subcutaneous Q24H  . fluticasone  2 spray Each Nare Daily  . insulin aspart  0-20 Units Subcutaneous 6 times per day  . insulin glargine  20 Units Subcutaneous QHS  . lisinopril  20 mg Oral Daily  . multivitamin with minerals  1 tablet Oral Daily  . pantoprazole (PROTONIX) IV  40 mg Intravenous QHS  . pneumococcal 23 valent vaccine  0.5 mL Intramuscular Tomorrow-1000     Assessment/Plan: s/p Procedure(s): EXPLORATORY LAPAROTOMY AND LYSIS OF ADHESIONS  RECURRENT INCISIONAL HERNIA INSERTION OF MESH   POD #4. Repair recurrent incarcerated incisional hernia and lysis of adhesions for SBO Implantation of anterior onlay Prolene mesh Stable Lovenox for DVT prophylaxis Discontinue NG Full liquid diet  Diabetes mellitus-2- CBGs 98-137 Continue same Lantus insulin Monitor as diet advanced  Hypertension Multiparity Obesity Hyperlipidemia History laparotomy and small bowel resection for SBO 2007, details unknown.  @PROBHOSP @  LOS: 4 days    Kamica Florance M 01/08/2016  . .prob

## 2016-01-08 NOTE — Progress Notes (Signed)
[facility]      [address] [place], [place] [address]    Phone: [phone] FAX: [phone]     Subjective: Spoke via telephone interpreter  Passing flatus since Wednesday afternoon. No n/v. Ambulating. No dysuria. No n/v.  NGT 2040m/24h.  Lt drain 236m24h Right drain 10040m4h.   Objective:  Vital signs:  Filed Vitals:   01/07/16 0641 01/07/16 1250 01/07/16 2150 01/08/16 0538  BP: 133/79 128/71 119/68 125/64  Pulse: 93 90 84 78  Temp: 99.2 F (37.3 C) 98.9 F (37.2 C) 98 F (36.7 C) 98.3 F (36.8 C)  TempSrc: Oral Oral Oral Oral  Resp: 18 18 18 18   Height:      Weight:      SpO2: 95% 99% 97% 97%       Intake/Output   Yesterday:  03/09 0701 - 03/10 0700 In: 2645 [P.O.:1115; I.V.:1500; NG/GT:30] Out: 890 [Urine:150; Emesis/NG output:650; Drains:90] This shift:     Physical Exam: General: Pt awake/alert/oriented x4 in no acute distress Chest: cta. No chest wall pain w good excursion CV: Pulses intact. Regular rhythm MS: Normal AROM mjr joints. No obvious deformity Abdomen: Soft. Nondistended. Mildly tender at incisions only. Incision is c/d/i. rlq drain with serosanguinous output. llq drain with serous output. No evidence of peritonitis. No incarcerated hernias. Ext: SCDs BLE. No mjr edema. No cyanosis Skin: No petechiae / purpura  Problem List:   Active Problems:   Recurrent ventral incisional hernia with obstruction    Results:   Labs: Results for orders placed or performed during the hospital encounter of 01/04/16 (from the past 48 hour(s))  Glucose, capillary     Status: Abnormal   Collection Time: 01/06/16  7:43 AM  Result Value Ref Range   Glucose-Capillary 162 (H) 65 - 99 mg/dL   Comment 1 Notify RN   Glucose, capillary     Status: Abnormal   Collection Time: 01/06/16 11:51 AM  Result Value Ref Range   Glucose-Capillary 160 (H) 65 - 99 mg/dL   Comment 1 Notify RN    Glucose, capillary     Status: Abnormal   Collection Time: 01/06/16  4:36 PM  Result Value Ref Range   Glucose-Capillary 121 (H) 65 - 99 mg/dL   Comment 1 Notify RN   Glucose, capillary     Status: Abnormal   Collection Time: 01/06/16  7:39 PM  Result Value Ref Range   Glucose-Capillary 115 (H) 65 - 99 mg/dL   Comment 1 Notify RN   Glucose, capillary     Status: None   Collection Time: 01/07/16 12:30 AM  Result Value Ref Range   Glucose-Capillary 96 65 - 99 mg/dL  Glucose, capillary     Status: None   Collection Time: 01/07/16  4:13 AM  Result Value Ref Range   Glucose-Capillary 92 65 - 99 mg/dL  Glucose, capillary     Status: Abnormal   Collection Time: 01/07/16  8:16 AM  Result Value Ref Range   Glucose-Capillary 102 (H) 65 - 99 mg/dL  Glucose, capillary     Status: Abnormal   Collection Time: 01/07/16 12:45 PM  Result Value Ref Range   Glucose-Capillary 133 (H) 65 - 99 mg/dL  Glucose, capillary     Status: Abnormal   Collection Time: 01/07/16  4:15 PM  Result Value Ref Range   Glucose-Capillary 130 (H) 65 - 99 mg/dL  Glucose, capillary     Status: Abnormal  Collection Time: 01/07/16  8:10 PM  Result Value Ref Range   Glucose-Capillary 137 (H) 65 - 99 mg/dL   Comment 1 Notify RN   Glucose, capillary     Status: Abnormal   Collection Time: 01/07/16 11:17 PM  Result Value Ref Range   Glucose-Capillary 119 (H) 65 - 99 mg/dL  CBC     Status: Abnormal   Collection Time: 01/08/16  4:54 AM  Result Value Ref Range   WBC 5.3 4.0 - 10.5 K/uL   RBC 4.11 3.87 - 5.11 MIL/uL   Hemoglobin 9.0 (L) 12.0 - 15.0 g/dL   HCT 30.4 (L) 36.0 - 46.0 %   MCV 74.0 (L) 78.0 - 100.0 fL   MCH 21.9 (L) 26.0 - 34.0 pg   MCHC 29.6 (L) 30.0 - 36.0 g/dL   RDW 19.8 (H) 11.5 - 15.5 %   Platelets 150 150 - 400 K/uL  Basic metabolic panel     Status: Abnormal   Collection Time: 01/08/16  4:54 AM  Result Value Ref Range   Sodium 144 135 - 145 mmol/L   Potassium 3.7 3.5 - 5.1 mmol/L   Chloride  112 (H) 101 - 111 mmol/L   CO2 23 22 - 32 mmol/L   Glucose, Bld 109 (H) 65 - 99 mg/dL   BUN 6 6 - 20 mg/dL   Creatinine, Ser 0.43 (L) 0.44 - 1.00 mg/dL   Calcium 8.5 (L) 8.9 - 10.3 mg/dL   GFR calc non Af Amer >60 >60 mL/min   GFR calc Af Amer >60 >60 mL/min    Comment: (NOTE) The eGFR has been calculated using the CKD EPI equation. This calculation has not been validated in all clinical situations. eGFR's persistently <60 mL/min signify possible Chronic Kidney Disease.    Anion gap 9 5 - 15  Glucose, capillary     Status: None   Collection Time: 01/08/16  5:36 AM  Result Value Ref Range   Glucose-Capillary 98 65 - 99 mg/dL   Comment 1 Notify RN     Imaging / Studies: No results found.  Medications / Allergies:  Scheduled Meds: . enoxaparin (LOVENOX) injection  40 mg Subcutaneous Q24H  . fluticasone  2 spray Each Nare Daily  . insulin aspart  0-20 Units Subcutaneous 6 times per day  . insulin glargine  20 Units Subcutaneous QHS  . lisinopril  20 mg Oral Daily  . multivitamin with minerals  1 tablet Oral Daily  . pantoprazole (PROTONIX) IV  40 mg Intravenous QHS  . pneumococcal 23 valent vaccine  0.5 mL Intramuscular Tomorrow-1000   Continuous Infusions: . 0.9 % NaCl with KCl 20 mEq / L 125 mL/hr at 01/07/16 1507   PRN Meds:.HYDROmorphone (DILAUDID) injection, methocarbamol, ondansetron **OR** ondansetron (ZOFRAN) IV, sodium chloride  Antibiotics: Anti-infectives    Start     Dose/Rate Route Frequency Ordered Stop   01/04/16 1600  ceFAZolin (ANCEF) IVPB 2 g/50 mL premix  Status:  Discontinued    Comments:  Acute small bowel obstruction   2 g 100 mL/hr over 30 Minutes Intravenous Every 8 hours 01/04/16 1459 01/06/16 1257   01/04/16 1500  ceFAZolin (ANCEF) IVPB 2 g/50 mL premix  Status:  Discontinued     2 g 100 mL/hr over 30 Minutes Intravenous 3 times per day 01/04/16 1459 01/04/16 1505   01/04/16 1136  polymyxin B 500,000 Units, bacitracin 50,000 Units in sodium  chloride irrigation 0.9 % 500 mL irrigation  Status:  Discontinued  As needed 01/04/16 1206 01/04/16 1442   01/04/16 0845  ceFAZolin (ANCEF) IVPB 2 g/50 mL premix     2 g 100 mL/hr over 30 Minutes Intravenous On call to O.R. 01/04/16 0830 01/04/16 1000       Assessment/Plan Incarcerated hernia with obstruction POD#3 exploratory laparotomy, LOA, repair if recurrent incarcerated incisional hernia with mesh---Dr. Dalbert Batman  -clamp NGT and allow for clears -mobilize and IS -abdominal binder -continue with drains ID-[id number] ?duration and indication  DM II-SSI CBGs, lantus  HTN-home meds  FEN-clears, IVF VTE prophylaxis-SCD/lovenox Dispo-ileus   Erby Pian, ANP-BC [facility] Pager [phone](7A-4:30P) For consults and floor pages call [phone](7A-4:30P)

## 2016-01-09 LAB — GLUCOSE, CAPILLARY
GLUCOSE-CAPILLARY: 106 mg/dL — AB (ref 65–99)
GLUCOSE-CAPILLARY: 188 mg/dL — AB (ref 65–99)
Glucose-Capillary: 109 mg/dL — ABNORMAL HIGH (ref 65–99)
Glucose-Capillary: 123 mg/dL — ABNORMAL HIGH (ref 65–99)
Glucose-Capillary: 152 mg/dL — ABNORMAL HIGH (ref 65–99)

## 2016-01-09 MED ORDER — SODIUM CHLORIDE 0.9% FLUSH: 3.0000 mL | INTRAVENOUS | Status: DC | PRN

## 2016-01-09 NOTE — Progress Notes (Signed)
5 Days Post-Op  Subjective: Sitting up in chair.  Voiding well.  Bowels moving.  Tolerating liquid diet.  Objective: Vital signs in last 24 hours: Temp:  [97.4 F (36.3 C)-98.6 F (37 C)] 97.4 F (36.3 C) (03/11 0630) Pulse Rate:  [67-77] 67 (03/11 0630) Resp:  [18] 18 (03/11 0630) BP: (112-122)/(59-66) 116/61 mmHg (03/11 0630) SpO2:  [100 %] 100 % (03/11 0630) Last BM Date: 01/08/16  Intake/Output from previous day: 03/10 0701 - 03/11 0700 In: 4700 [P.O.:1100; I.V.:3500] Out: 105 [Drains:105] Intake/Output this shift:    PE: General- In NAD Abdomen-soft, incision clean and intact, thin serosanguinous drain output  Lab Results:   Recent Labs  01/08/16 0454  WBC 5.3  HGB 9.0*  HCT 30.4*  PLT 150   BMET  Recent Labs  01/08/16 0454  NA 144  K 3.7  CL 112*  CO2 23  GLUCOSE 109*  BUN 6  CREATININE 0.43*  CALCIUM 8.5*   PT/INR No results for input(s): LABPROT, INR in the last 72 hours. Comprehensive Metabolic Panel:    Component Value Date/Time   NA 144 01/08/2016 0454   NA 142 01/06/2016 0530   K 3.7 01/08/2016 0454   K 4.2 01/06/2016 0530   CL 112* 01/08/2016 0454   CL 112* 01/06/2016 0530   CO2 23 01/08/2016 0454   CO2 23 01/06/2016 0530   BUN 6 01/08/2016 0454   BUN 10 01/06/2016 0530   CREATININE 0.43* 01/08/2016 0454   CREATININE 0.64 01/06/2016 0530   CREATININE 0.61 03/12/2014 1002   CREATININE 0.46* 01/14/2013 1617   GLUCOSE 109* 01/08/2016 0454   GLUCOSE 165* 01/06/2016 0530   CALCIUM 8.5* 01/08/2016 0454   CALCIUM 8.6* 01/06/2016 0530   AST 24 01/03/2016 2309   AST 18 03/12/2014 1002   ALT 26 01/03/2016 2309   ALT 25 03/12/2014 1002   ALKPHOS 109 01/03/2016 2309   ALKPHOS 94 03/12/2014 1002   BILITOT 1.1 01/03/2016 2309   BILITOT 0.4 03/12/2014 1002   PROT 8.0 01/03/2016 2309   PROT 6.0 03/12/2014 1002   ALBUMIN 3.9 01/03/2016 2309   ALBUMIN 3.6 03/12/2014 1002     Studies/Results: No results  found.  Anti-infectives: Anti-infectives    Start     Dose/Rate Route Frequency Ordered Stop   01/04/16 1600  ceFAZolin (ANCEF) IVPB 2 g/50 mL premix  Status:  Discontinued    Comments:  Acute small bowel obstruction   2 g 100 mL/hr over 30 Minutes Intravenous Every 8 hours 01/04/16 1459 01/06/16 1257   01/04/16 1500  ceFAZolin (ANCEF) IVPB 2 g/50 mL premix  Status:  Discontinued     2 g 100 mL/hr over 30 Minutes Intravenous 3 times per day 01/04/16 1459 01/04/16 1505   01/04/16 1136  polymyxin B 500,000 Units, bacitracin 50,000 Units in sodium chloride irrigation 0.9 % 500 mL irrigation  Status:  Discontinued       As needed 01/04/16 1206 01/04/16 1442   01/04/16 0845  ceFAZolin (ANCEF) IVPB 2 g/50 mL premix     2 g 100 mL/hr over 30 Minutes Intravenous On call to O.R. 01/04/16 0830 01/04/16 1000      Assessment  POD #5. Repair recurrent incarcerated incisional hernia and lysis of adhesions for SBO Implantation of anterior onlay Prolene mesh -progressing well; bowels moving. Lovenox for DVT prophylaxis On full liquid diet- will advance to solid carb modified diet and heplock IV  Diabetes mellitus-2- CBGs 106-175 Continue same Lantus insulin Monitor as diet advanced  LOS: 5 days   Plan:    Sharian Delia J 01/09/2016

## 2016-01-09 NOTE — Progress Notes (Signed)
Orthopedic Tech Progress Note Patient Details:  [place] 13-Oct-1966 [id number]  Ortho Devices Type of Ortho Device: Abdominal binder   [place] Pink 01/09/2016, 12:12 PM

## 2016-01-10 LAB — GLUCOSE, CAPILLARY
GLUCOSE-CAPILLARY: 123 mg/dL — AB (ref 65–99)
GLUCOSE-CAPILLARY: 100 mg/dL — AB (ref 65–99)
Glucose-Capillary: 130 mg/dL — ABNORMAL HIGH (ref 65–99)

## 2016-01-10 MED ORDER — OXYCODONE HCL 5 MG PO TABS: 5.0000 mg | ORAL_TABLET | ORAL | Status: DC | PRN

## 2016-01-10 MED ORDER — GLUCOSE BLOOD VI STRP: ORAL_STRIP | Status: DC

## 2016-01-10 NOTE — Progress Notes (Signed)
AVS given to patient via Interpreter.  IV removed. Understanding verbalized with Interpreter.  Dressing supplies given to patient to manage her surgery site at home.  Transportation arranged with spouse.

## 2016-01-10 NOTE — Discharge Instructions (Addendum)
No lifting, pushing, or pulling over 10 pounds for 6 weeks.  No driving until your doctor says it is okay.  Wear abdominal binder.  Shower every day and changes bandages once a day.  Do not eat sweets or drink sodas.  Do not overeat.  Walk a lot.  Appointment in office this Thursday or Friday to have stitches removed.  Please call to make the appointment [phone]).  Make an appointment to see your family doctor in 2 weeks to check your diabetes.  Call your family doctor to get a machine to check your blood sugars.  Take Simethecone tablets for gas.  Call for high fever (>101.5), wound problems, persistent vomiting.

## 2016-01-10 NOTE — Progress Notes (Signed)
6 Days Post-Op  Subjective: Tolerated solid diet.  Objective: Vital signs in last 24 hours: Temp:  [98.4 F (36.9 C)-98.6 F (37 C)] 98.5 F (36.9 C) (03/12 0523) Pulse Rate:  [67-79] 73 (03/12 0523) Resp:  [18] 18 (03/12 0523) BP: (116-123)/(55-65) 116/64 mmHg (03/12 0523) SpO2:  [100 %] 100 % (03/12 0523) Last BM Date: 01/08/16  Intake/Output from previous day: 03/11 0701 - 03/12 0700 In: 600 [P.O.:600] Out: 37 [Drains:37] Intake/Output this shift:    PE: General- In NAD Abdomen-soft, incision clean and intact, minimal output from drains-both drains removed.  Lab Results:   Recent Labs  01/08/16 0454  WBC 5.3  HGB 9.0*  HCT 30.4*  PLT 150   BMET  Recent Labs  01/08/16 0454  NA 144  K 3.7  CL 112*  CO2 23  GLUCOSE 109*  BUN 6  CREATININE 0.43*  CALCIUM 8.5*   PT/INR No results for input(s): LABPROT, INR in the last 72 hours. Comprehensive Metabolic Panel:    Component Value Date/Time   NA 144 01/08/2016 0454   NA 142 01/06/2016 0530   K 3.7 01/08/2016 0454   K 4.2 01/06/2016 0530   CL 112* 01/08/2016 0454   CL 112* 01/06/2016 0530   CO2 23 01/08/2016 0454   CO2 23 01/06/2016 0530   BUN 6 01/08/2016 0454   BUN 10 01/06/2016 0530   CREATININE 0.43* 01/08/2016 0454   CREATININE 0.64 01/06/2016 0530   CREATININE 0.61 03/12/2014 1002   CREATININE 0.46* 01/14/2013 1617   GLUCOSE 109* 01/08/2016 0454   GLUCOSE 165* 01/06/2016 0530   CALCIUM 8.5* 01/08/2016 0454   CALCIUM 8.6* 01/06/2016 0530   AST 24 01/03/2016 2309   AST 18 03/12/2014 1002   ALT 26 01/03/2016 2309   ALT 25 03/12/2014 1002   ALKPHOS 109 01/03/2016 2309   ALKPHOS 94 03/12/2014 1002   BILITOT 1.1 01/03/2016 2309   BILITOT 0.4 03/12/2014 1002   PROT 8.0 01/03/2016 2309   PROT 6.0 03/12/2014 1002   ALBUMIN 3.9 01/03/2016 2309   ALBUMIN 3.6 03/12/2014 1002     Studies/Results: No results found.  Anti-infectives: Anti-infectives    Start     Dose/Rate Route Frequency  Ordered Stop   01/04/16 1600  ceFAZolin (ANCEF) IVPB 2 g/50 mL premix  Status:  Discontinued    Comments:  Acute small bowel obstruction   2 g 100 mL/hr over 30 Minutes Intravenous Every 8 hours 01/04/16 1459 01/06/16 1257   01/04/16 1500  ceFAZolin (ANCEF) IVPB 2 g/50 mL premix  Status:  Discontinued     2 g 100 mL/hr over 30 Minutes Intravenous 3 times per day 01/04/16 1459 01/04/16 1505   01/04/16 1136  polymyxin B 500,000 Units, bacitracin 50,000 Units in sodium chloride irrigation 0.9 % 500 mL irrigation  Status:  Discontinued       As needed 01/04/16 1206 01/04/16 1442   01/04/16 0845  ceFAZolin (ANCEF) IVPB 2 g/50 mL premix     2 g 100 mL/hr over 30 Minutes Intravenous On call to O.R. 01/04/16 0830 01/04/16 1000      Assessment  POD #6. Repair recurrent incarcerated incisional hernia and lysis of adhesions for SBO Implantation of anterior onlay Prolene mesh -continues to do well. Lovenox for DVT prophylaxis   Diabetes mellitus-2- CBGs 123-188    LOS: 6 days   Plan: Discharge today.  Follow up in office later this week for staple removal.  Restart Metformin for DM.  Instructions given to  her and her husband.   Laraine Samet J 01/10/2016

## 2016-01-11 ENCOUNTER — Telehealth: Payer: Self-pay | Admitting: Family Medicine

## 2016-01-11 DIAGNOSIS — E1169 Type 2 diabetes mellitus with other specified complication: Secondary | ICD-10-CM

## 2016-01-11 DIAGNOSIS — E669 Obesity, unspecified: Principal | ICD-10-CM

## 2016-01-11 NOTE — Telephone Encounter (Signed)
Patient requesting a RX for glucometer, test strips and lancets to be sent to [facility] on [address]. Please call daughter, Elder Negus at [phone] when this is completed.

## 2016-01-18 MED ORDER — GLUCOSE BLOOD VI STRP: ORAL_STRIP | Status: DC

## 2016-01-18 MED ORDER — RELION PRIME MONITOR DEVI: 1.0000 | Freq: Every day | Status: DC

## 2016-01-18 MED ORDER — RELION LANCETS THIN 26G MISC: 1.0000 | Freq: Every day | Status: DC

## 2016-01-18 NOTE — Telephone Encounter (Signed)
Sent to pharmacy. Please inform pt's daughter.

## 2016-01-18 NOTE — Discharge Summary (Signed)
Physician Discharge Summary  Meredith Maynard [id number] DOB: 08-Apr-1966 DOA: 01/04/2016  PCP: Meredith Roux, MD  Consultation:  Admit date: 01/04/2016 Discharge date: 01/10/2016  Recommendations for Outpatient Follow-up:   Discharge Diagnoses:  1. Incarcerated incisional hernia with obstruction  2. DM II 3. HTN   Surgical Procedure: exploratory laparotomy, LOA, repair if recurrent incarcerated incisional hernia with mesh---Dr. Dalbert Batman   Discharge Condition: stable Disposition: home  Diet recommendation: carb modified   Filed Weights   01/03/16 2235 01/04/16 1501  Weight: 106.142 kg (234 lb) 109.135 kg (240 lb 9.6 oz)      Filed Vitals:   01/09/16 2103 01/10/16 0523  BP: 123/55 116/64  Pulse: 79 73  Temp: 98.4 F (36.9 C) 98.5 F (36.9 C)  Resp: 18 18    Hospital Course:  Meredith Maynard is a 50 year old female who presented to [facility] with an incarcerated incisional hernia with obstruction on CT scan.  She underwent the procedure listed above which she tolerated well and was transferred to the floor. As ileus resolved, NGT was removed and diet was advanced.  The patient was mobilized.  Remained on lovenox for VTE prophylaxis and SSI for diabetes.  On POD#6 the patient was tolerating a diet, having bowel function, afebrile and therefore felt stable for discharge home.    Discharge Instructions     Medication List    STOP taking these medications        ondansetron 4 MG disintegrating tablet  Commonly known as:  ZOFRAN ODT      TAKE these medications        aspirin EC 81 MG tablet  Take 1 tablet (81 mg total) by mouth daily.     famotidine 20 MG tablet  Commonly known as:  PEPCID  Take 1 tablet (20 mg total) by mouth 2 (two) times daily.     ferrous sulfate 325 (65 FE) MG EC tablet  Take 325 mg by mouth daily.     fluticasone 50 MCG/ACT nasal spray  Commonly known as:  FLONASE  Place 2 sprays into both nostrils daily.     glucose blood test strip   Commonly known as:  ACCU-CHEK ACTIVE STRIPS  Use as instructed     ibuprofen 200 MG tablet  Commonly known as:  ADVIL,MOTRIN  Take 400 mg by mouth every 6 (six) hours as needed for moderate pain. For pain     lisinopril 20 MG tablet  Commonly known as:  PRINIVIL,ZESTRIL  Take 1 tablet (20 mg total) by mouth daily.     metFORMIN 1000 MG tablet  Commonly known as:  GLUCOPHAGE  Take 1 tablet (1,000 mg total) by mouth 2 (two) times daily with a meal.     multivitamin with minerals tablet  Take 1 tablet by mouth daily.     omeprazole 20 MG capsule  Commonly known as:  PRILOSEC  Take 1 capsule (20 mg total) by mouth daily.     oxyCODONE 5 MG immediate release tablet  Commonly known as:  Oxy IR/ROXICODONE  Take 1-2 tablets (5-10 mg total) by mouth every 4 (four) hours as needed for moderate pain, severe pain or breakthrough pain.          The results of significant diagnostics from this hospitalization (including imaging, microbiology, ancillary and laboratory) are listed below for reference.    Significant Diagnostic Studies: Ct Abdomen Pelvis W Contrast  01/04/2016  CLINICAL DATA:  Generalized abdominal pain, nausea, vomiting, and diarrhea since Friday. EXAM: CT  ABDOMEN AND PELVIS WITH CONTRAST TECHNIQUE: Multidetector CT imaging of the abdomen and pelvis was performed using the standard protocol following bolus administration of intravenous contrast. CONTRAST:  187mL OMNIPAQUE IOHEXOL 300 MG/ML  SOLN COMPARISON:  11/19/2013 FINDINGS: The lung bases are clear. Diffuse fatty infiltration of the liver. Small nodule in the left adrenal gland is demonstrated measuring 1.2 cm diameter. No definite macroscopic fat. Statistically this is most likely an adenoma. Consider follow-up in 1 year with MRI or noncontrast CT. Gallbladder, pancreas, spleen, kidneys, abdominal aorta, inferior vena cava, and retroperitoneal lymph nodes are unremarkable. Stomach is decompressed. Scattered gas and stool in  the colon without abnormal distention. There is a large midline anterior wall abdominal hernia containing small bowel. There is a small bowel anastomosis within the hernia. There is small bowel distention and fecalization with mild mesenteric edema. The terminal ileum is decompressed. Transition zone is within the hernia. Changes are consistent with small bowel obstruction likely due to hernia. No discrete bowel wall thickening. No pneumatosis. Pelvis: Uterus is not enlarged. Asymmetric prominence of right ovary at 2.5 x 4.6 cm. Circumscribed low-attenuation lesion in the left ovary measuring 3.4 cm. These changes likely represent functional cystic change. Surgical clips consistent with tubal ligations. Bladder wall is not thickened. No free or loculated pelvic fluid collections. No pelvic mass or lymphadenopathy. No destructive bone lesions. IMPRESSION: Large ventral abdominal wall hernia containing multiple loops of small bowel. Changes of small bowel obstruction with transition zone within the hernia. Mesenteric edema. Electronically Signed   By: Lucienne Capers M.D.   On: 01/04/2016 04:42   Dg Abd 2 Views  01/04/2016  CLINICAL DATA:  Vomiting since Friday. History of gastritis. Abdominal pain. EXAM: ABDOMEN - 2 VIEW COMPARISON:  CT abdomen and pelvis 11/19/2013 FINDINGS: Gas and stool are demonstrated throughout the colon. There are some mid abdominal small bowel loops which are mildly distended and a few air-fluid levels are demonstrated on the upright view suggesting obstruction. No free intra-abdominal air. No radiopaque stones. Mild degenerative changes in the spine with mild lumbar scoliosis convex towards the left. Surgical clips in the pelvis consistent with tubal ligations. IMPRESSION: Scattered gas-filled distended small bowel with air-fluid levels suggesting partial small bowel obstruction. Electronically Signed   By: Lucienne Capers M.D.   On: 01/04/2016 02:42    Microbiology: No results found  for this or any previous visit (from the past 240 hour(s)).   Labs: Basic Metabolic Panel: No results for input(s): NA, K, CL, CO2, GLUCOSE, BUN, CREATININE, CALCIUM, MG, PHOS in the last 168 hours. Liver Function Tests: No results for input(s): AST, ALT, ALKPHOS, BILITOT, PROT, ALBUMIN in the last 168 hours. No results for input(s): LIPASE, AMYLASE in the last 168 hours. No results for input(s): AMMONIA in the last 168 hours. CBC: No results for input(s): WBC, NEUTROABS, HGB, HCT, MCV, PLT in the last 168 hours. Cardiac Enzymes: No results for input(s): CKTOTAL, CKMB, CKMBINDEX, TROPONINI in the last 168 hours. BNP: BNP (last 3 results) No results for input(s): BNP in the last 8760 hours.  ProBNP (last 3 results) No results for input(s): PROBNP in the last 8760 hours.  CBG: No results for input(s): GLUCAP in the last 168 hours.  Active Problems:   Recurrent ventral incisional hernia with obstruction   Time coordinating discharge: <30 mins   Signed:  Richanda Darin, ANP-BC

## 2016-01-19 ENCOUNTER — Encounter: Payer: Self-pay | Admitting: Family Medicine

## 2016-01-19 ENCOUNTER — Ambulatory Visit: Payer: Self-pay

## 2016-01-19 ENCOUNTER — Ambulatory Visit (INDEPENDENT_AMBULATORY_CARE_PROVIDER_SITE_OTHER): Payer: Self-pay | Admitting: Family Medicine

## 2016-01-19 VITALS — BP 116/57 | HR 78 | Temp 97.8°F | Ht 65.0 in | Wt 230.2 lb

## 2016-01-19 DIAGNOSIS — B351 Tinea unguium: Secondary | ICD-10-CM

## 2016-01-19 DIAGNOSIS — E119 Type 2 diabetes mellitus without complications: Secondary | ICD-10-CM

## 2016-01-19 DIAGNOSIS — E785 Hyperlipidemia, unspecified: Secondary | ICD-10-CM

## 2016-01-19 DIAGNOSIS — E669 Obesity, unspecified: Secondary | ICD-10-CM

## 2016-01-19 DIAGNOSIS — K430 Incisional hernia with obstruction, without gangrene: Secondary | ICD-10-CM

## 2016-01-19 DIAGNOSIS — E1169 Type 2 diabetes mellitus with other specified complication: Secondary | ICD-10-CM

## 2016-01-19 MED ORDER — TERBINAFINE HCL 250 MG PO TABS: 250.0000 mg | ORAL_TABLET | Freq: Every day | ORAL | Status: DC

## 2016-01-19 MED ORDER — ATORVASTATIN CALCIUM 40 MG PO TABS: 40.0000 mg | ORAL_TABLET | Freq: Every day | ORAL | Status: DC

## 2016-01-19 MED ORDER — LANCETS MISC: 1.0000 | Freq: Every day | Status: DC

## 2016-01-19 MED ORDER — GLUCOSE BLOOD VI STRP
ORAL_STRIP | Status: DC
Start: 2016-01-19 — End: 2019-02-15

## 2016-01-19 MED ORDER — GLIPIZIDE 5 MG PO TABS: 5.0000 mg | ORAL_TABLET | Freq: Every day | ORAL | Status: DC

## 2016-01-19 MED ORDER — AGAMATRIX PRESTO W/DEVICE KIT: 1.0000 | PACK | Freq: Every day | Status: DC

## 2016-01-19 MED ORDER — METFORMIN HCL 1000 MG PO TABS: 1000.0000 mg | ORAL_TABLET | Freq: Two times a day (BID) | ORAL | Status: DC

## 2016-01-19 MED ORDER — LISINOPRIL 20 MG PO TABS: 20.0000 mg | ORAL_TABLET | Freq: Every day | ORAL | Status: DC

## 2016-01-19 NOTE — Telephone Encounter (Signed)
Pt informed pt daughter that i tried to call her yesterday and i didn't get a answer and she was informed of the message below during her moms appt today. Jenavieve Freda Kennon Holter, CMA

## 2016-01-22 DIAGNOSIS — K430 Incisional hernia with obstruction, without gangrene: Secondary | ICD-10-CM

## 2016-01-22 HISTORY — DX: Incisional hernia with obstruction, without gangrene: K43.0

## 2016-01-22 NOTE — Assessment & Plan Note (Signed)
Recently hospitalized for emergent repair, doing well, no further pain, incision healing well - f/u with surgery for post-op check

## 2016-01-22 NOTE — Progress Notes (Signed)
Subjective:   Meredith Maynard is a 50 y.o. female with a history of dm and recent hospitalization for recurrent incisional hernia here for f/u of same  Pt reports she is doing well from her abdominal surgery. No fevers, able to eat and drink normal, no significant abdominal pain. Has f/u scheduled with surgery this week.  CHRONIC DIABETES  Disease Monitoring  Blood Sugar Ranges: doesn't check, previous A1cs in 7s, now up to 10.5   Polyuria: no   Visual problems: no   Medication Compliance: taking metformin but not glipizide because she feels it drops her too low, she tolerates ok if she takes it qod  Medication Side Effects  Hypoglycemia: feels symptoms of sweating and dizziness but doesn't check   Preventitive Health Care  Eye Exam: due  Foot Exam: done today  Diet pattern: well balanced, previously eating too many carbs but is now watching diet closely again  Exercise: rare   Review of Systems:  Per HPI. All other systems reviewed and are negative.   PMH, PSH, Medications, Allergies, and FmHx reviewed and updated in EMR.  Social History: never smoker  Objective:  BP 116/57 mmHg  Pulse 78  Temp(Src) 97.8 F (36.6 C) (Oral)  Ht 5\' 5"  (1.651 m)  Wt 230 lb 3.2 oz (104.418 kg)  BMI 38.31 kg/m2  Gen:  50 y.o. female in NAD HEENT: NCAT, MMM, EOMI, PERRL, anicteric sclerae CV: RRR, no MRG, no JVD Resp: Non-labored, CTAB, no wheezes noted Abd: Soft, NTND, BS present, no guarding or organomegaly Ext: WWP, no edema MSK: Full ROM, strength intact Neuro: Alert and oriented, speech normal Feet: thickened, yellow nails diffusely, otherwise normal, see DM foot exam      Chemistry      Component Value Date/Time   NA 144 01/08/2016 0454   K 3.7 01/08/2016 0454   CL 112* 01/08/2016 0454   CO2 23 01/08/2016 0454   BUN 6 01/08/2016 0454   CREATININE 0.43* 01/08/2016 0454   CREATININE 0.61 03/12/2014 1002      Component Value Date/Time   CALCIUM 8.5* 01/08/2016 0454     ALKPHOS 109 01/03/2016 2309   AST 24 01/03/2016 2309   ALT 26 01/03/2016 2309   BILITOT 1.1 01/03/2016 2309      Lab Results  Component Value Date   WBC 5.3 01/08/2016   HGB 9.0* 01/08/2016   HCT 30.4* 01/08/2016   MCV 74.0* 01/08/2016   PLT 150 01/08/2016   Lab Results  Component Value Date   TSH 0.874 03/12/2014   Lab Results  Component Value Date   HGBA1C 10.5* 01/04/2016   Assessment & Plan:     Meredith Maynard is a 50 y.o. female here for dm f/u  Diabetes mellitus type 2 in obese Previously well controlled with rising A1c to 10.5, complaining of hypoglycemia symptoms but not checking glucose, stopped taking glipizide because of worries about lows - continue metformin and restart glipizide - gave script for new meter and supplies, pt to log glucose at least daily and bring record to f/u in 1 month   Onychomycosis Treated with lamisil for 1 month last year, go better but now its back - lamisil x3 months - cmp next month at dm f/u, normal lfts in hospital   Recurrent incisional hernia with incarceration Recently hospitalized for emergent repair, doing well, no further pain, incision healing well - f/u with surgery for post-op check      Meredith Roux, MD, MPH [place] PGY-3  01/22/2016 1:45 PM

## 2016-01-22 NOTE — Assessment & Plan Note (Signed)
Previously well controlled with rising A1c to 10.5, complaining of hypoglycemia symptoms but not checking glucose, stopped taking glipizide because of worries about lows - continue metformin and restart glipizide - gave script for new meter and supplies, pt to log glucose at least daily and bring record to f/u in 1 month

## 2016-01-22 NOTE — Assessment & Plan Note (Signed)
Treated with lamisil for 1 month last year, go better but now its back - lamisil x3 months - cmp next month at dm f/u, normal lfts in hospital

## 2016-02-02 ENCOUNTER — Encounter: Payer: Self-pay | Admitting: Family Medicine

## 2016-02-02 ENCOUNTER — Ambulatory Visit (INDEPENDENT_AMBULATORY_CARE_PROVIDER_SITE_OTHER): Payer: Self-pay | Admitting: Family Medicine

## 2016-02-02 VITALS — BP 110/70 | HR 86 | Temp 98.3°F | Ht 65.0 in | Wt 229.0 lb

## 2016-02-02 DIAGNOSIS — H8110 Benign paroxysmal vertigo, unspecified ear: Secondary | ICD-10-CM

## 2016-02-02 DIAGNOSIS — H8112 Benign paroxysmal vertigo, left ear: Secondary | ICD-10-CM

## 2016-02-02 HISTORY — DX: Benign paroxysmal vertigo, unspecified ear: H81.10

## 2016-02-02 MED ORDER — MECLIZINE HCL 25 MG PO TABS: 25.0000 mg | ORAL_TABLET | Freq: Three times a day (TID) | ORAL | Status: DC | PRN

## 2016-02-02 MED ORDER — PROMETHAZINE HCL 25 MG RE SUPP: 25.0000 mg | Freq: Three times a day (TID) | RECTAL | Status: DC | PRN

## 2016-02-02 NOTE — Patient Instructions (Signed)
Use the meclizine as needed for next few days and the Phenergan for nausea / vomiting. Do the Epley maneuver handouts at home. If significant worsening or new concerns please follow-up and return to clinic if headaches, fever/chills, neck stiffness, vision loss, recurrent numbness, tingling or weakness.  Vrtigo posicional benigno (Benign Positional Vertigo) El vrtigo es la sensacin de que usted o todo lo que lo rodea se mueven cuando en realidad eso no sucede. El vrtigo posicional benigno es el tipo de vrtigo ms comn. La causa de este trastorno no es grave (es benigna). Algunos movimientos y determinadas posiciones pueden desencadenar el trastorno (es posicional). El vrtigo posicional benigno puede ser peligroso si ocurre mientras est haciendo algo que podra suponer un riesgo para usted y para los dems, por ejemplo, conduciendo un automvil.  CAUSAS En muchos de los [place], se desconoce la causa de este trastorno. Puede deberse a Passenger transport manager zona del odo interno que ayuda al cerebro a percibir el movimiento y a Neurosurgeon equilibrio. Esta alteracin puede deberse a una infeccin viral (laberintitis), a una lesin en la cabeza o a los movimientos reiterados. FACTORES DE RIESGO Es ms probable que esta afeccin se manifieste en:  Las mujeres.  [place] [id number]. SNTOMAS Generalmente, los sntomas de este trastorno se presentan al mover la cabeza o los ojos en diferentes direcciones. Pueden aparecer repentinamente y suelen durar menos de un minuto. Entre los sntomas se pueden incluir los siguientes:  Prdida del equilibrio y cadas.  Sensacin de estar dando vueltas o movindose.  Sensacin de que el entorno est dando vueltas o movindose.  Nuseas y vmitos.  Visin borrosa.  Mareos.  Movimientos oculares involuntarios (nistagmo). Los sntomas pueden ser leves y algo fastidiosos, o pueden ser graves e interferir en la vida cotidiana. Los episodios  de vrtigo posicional benigno pueden repetirse (ser recurrentes) a lo largo del [place], y algunos movimientos pueden desencadenarlos. Los sntomas pueden mejorar con Physiological scientist. DIAGNSTICO Generalmente, este trastorno se diagnostica con una historia clnica y un examen fsico de la cabeza, el cuello y los odos. Tal vez lo deriven a [place] en problemas de la garganta, la nariz y el odo (otorrinolaringlogo), o a uno que se especializa en trastornos del sistema nervioso (neurlogo). Pueden hacerle otros estudios, entre ellos:  Health visitor.  Tomografa computarizada.  Estudios de los [employer]. El mdico puede pedirle que cambie rpidamente de posicin mientras observa si se presentan sntomas de vrtigo posicional benigno, por ejemplo, nistagmo. Los movimientos oculares se pueden estudiar con una electronistagmografa (ENG), con estimulacin trmica, mediante la maniobra de Dix-Hallpike o con la prueba de rotacin.  Electroencefalograma (EEG). Este estudio registra la actividad elctrica del cerebro.  Pruebas de audicin. Kennis Carina, para tratar este trastorno, el mdico le har movimientos especficos con la cabeza para que el odo interno se normalice. [employer] casos son graves, tal vez haya que realizar una ciruga, pero esto no es frecuente. En algunos casos, el vrtigo posicional benigno se resuelve por s solo en el trmino de 2 o 4semanas. INSTRUCCIONES PARA EL CUIDADO EN EL HOGAR Seguridad  Muvase lentamente.No haga movimientos bruscos con el cuerpo o con la cabeza.  No conduzca.  No opere maquinaria pesada.  No haga ninguna tarea que podra ser peligrosa para usted o para Producer, television/film/video en caso de que ocurriera un episodio de vrtigo.  Si tiene dificultad para caminar o mantener el equilibrio, use un bastn para Consulting civil engineer estabilidad. Si se  siente mareado o inestable, sintese de inmediato.  Reanude sus actividades normales  como se lo haya indicado el mdico. Pregntele al mdico qu actividades son seguras para usted. Instrucciones generales  [employer] de venta libre y los recetados solamente como se lo haya indicado el mdico.  Evite algunas posiciones o determinados movimientos como se lo haya indicado el mdico.  Beba suficiente lquido para Theatre manager la orina clara o de color amarillo plido.  Concurra a todas las visitas de control como se lo haya indicado el mdico. Esto es importante. SOLICITE ATENCIN MDICA SI:  Jaclynn Guarneri.  El trastorno [place], o le aparecen sntomas nuevos.  Sus familiares o amigos advierten cambios en su comportamiento.  Las nuseas o los vmitos empeoran.  Tiene sensacin de hormigueo o de adormecimiento. SOLICITE ATENCIN MDICA DE INMEDIATO SI:  Tiene dificultad para hablar o para moverse.  Esta mareado todo [employer].  Se desmaya.  Tiene dolores de cabeza intensos.  Tiene debilidad en los brazos o las piernas.  Tiene cambios en la audicin o la visin.  Siente rigidez en el cuello.  Tiene sensibilidad a Naval architect.   Esta informacin no tiene Marine scientist el consejo del mdico. Asegrese de hacerle al mdico cualquier pregunta que tenga.   Document Released: 02/02/2009 Document Revised: 07/08/2015 Elsevier Interactive Patient Education [employer].

## 2016-02-02 NOTE — Progress Notes (Signed)
Subjective:    Patient ID: Meredith Maynard, female    DOB: 1966/05/04, 50 y.o.   MRN: [id number]  Denise Grbic is a 50 y.o. female presenting on 02/02/2016 for Dizziness   Patient presents for a same day appointment. History provided by patient in Spanish with assistance of [place] interpreter.   HPI   DIZZINES / VERITGO: - Known prior chronic history 10 yr ago with similar hernia surgical repair, about 1.5 weeks later she experienced, similar symptoms with dizziness and nausea/vomiting, diagnosed with vertigo treated dizziness / nausea with resolution within about 5 days. Does admit to having occasional mild "out of balance" symptoms that were mild and did not prompt her to return to doctor. - Recently had an umbilical hernia surgical repair on 01/04/16, initially did well post-op but then reports new symptoms starting yesterday 02/01/16, she was in kitchen at 1000 making something to eat, felt dizzy, overnight at 0300 woke up to go to bathroom, could not get up fast due to surgery, she experienced "dizziness and felt the room spinning around me", dizziness worse when lean to left side or sudden head movements to left. 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 Smoking status: Never Smoker   . Smokeless tobacco: Never Used  . Alcohol Use: No    Review of Systems Per HPI unless specifically indicated above     Objective:    BP 110/70 mmHg  Pulse 86  Temp(Src) 98.3 F (36.8 C) (Oral)  Ht 5\' 5"  (1.651 m)  Wt 229 lb (103.874 kg)  BMI 38.11 kg/m2  SpO2 98%  LMP 01/06/2016  Wt Readings from Last 3 Encounters:  02/02/16 229 lb (103.874 kg)    01/19/16 230 lb 3.2 oz (104.418 kg)  01/04/16 240 lb 9.6 oz (109.135 kg)    Physical Exam  Constitutional: She is oriented to person, place, and time. She appears well-developed and well-nourished. No distress.  Obese, well-appearing, comfortable, and cooperative  HENT:  Head: Normocephalic and atraumatic.  Right Ear: External ear normal.  Left Ear: External ear normal.  Mouth/Throat: Oropharynx is clear and moist.  Sinuses non-tender, nares patent without congestion, oropharynx clear and moist. Bilateral TM's clear with normal landmarks, no erythema, bulging or effusion.  Eyes: Conjunctivae and EOM are normal. Pupils are equal, round, and reactive to light. Right eye exhibits no discharge. Left eye exhibits no discharge.  Neck: Normal range of motion. Neck supple. No thyromegaly present.  Cardiovascular: Normal rate, regular rhythm, normal heart sounds and intact distal pulses.   No murmur heard. Pulmonary/Chest: Effort normal and breath sounds normal. No respiratory distress. She has no wheezes. She has no rales.  Abdominal: Soft. Bowel sounds are normal. She exhibits no distension. There is no tenderness.  Musculoskeletal: She exhibits no edema.  Lymphadenopathy:    She has no cervical adenopathy.  Neurological: She is alert and oriented to person, place, and time. No cranial nerve deficit.  Dix hall-pike positive on LEFT reproduced vertigo symptoms. No obvious nystagmus.  Distal sensation to light touch intact bilateral upper and lower ext. Resolved numbness on left forearm and hand, symmetric grip 5/5 muscle str.  Skin: Skin is warm and  dry. No rash noted. She is not diaphoretic.  Nursing note and vitals reviewed.      Assessment & Plan:   Problem List Items Addressed This Visit    BPPV (benign paroxysmal positional vertigo) - Primary    Acute on chronic episode of vertigo consistent with BPPV, similar to prior episode 10 yr ago also post-op from hernia repair, unclear as to  why this triggered. - Positive Dix-Hallpike to left - Not consistent with CNS etiology of vertigo, considered meniere's with chronic hearing loss L ear but no headaches, possibility.  Plan: 1. Handout given and demonstrated home Epley Manuever for LEFT BPPV, advised to do up to TID daily until symptoms free x 24 hr 2. Given precautionary rx meclizine PRN dizziness (can take for 1-3 days for now then PRN), also rx phenergan suppository for nausea (no vomiting) 3. Improve hydration 4. Return criteria given, consider future vestibular rehab if needed      Relevant Medications   meclizine (ANTIVERT) 25 MG tablet   promethazine (PHENERGAN) 25 MG suppository      Meds ordered this encounter  Medications  . meclizine (ANTIVERT) 25 MG tablet    Sig: Take 1 tablet (25 mg total) by mouth 3 (three) times daily as needed for dizziness.    Dispense:  30 tablet    Refill:  0  . promethazine (PHENERGAN) 25 MG suppository    Sig: Place 1 suppository (25 mg total) rectally every 8 (eight) hours as needed for nausea or vomiting.    Dispense:  10 each    Refill:  0      Follow up plan: Return in about 2 weeks (around 02/16/2016), or if symptoms worsen or fail to improve, for BPPV.  Nobie Putnam, [place], PGY-3

## 2016-02-02 NOTE — Assessment & Plan Note (Signed)
Acute on chronic episode of vertigo consistent with BPPV, similar to prior episode 10 yr ago also post-op from hernia repair, unclear as to why this triggered. - Positive Dix-Hallpike to left - Not consistent with CNS etiology of vertigo, considered meniere's with chronic hearing loss L ear but no headaches, possibility.  Plan: 1. Handout given and demonstrated home Epley Manuever for LEFT BPPV, advised to do up to TID daily until symptoms free x 24 hr 2. Given precautionary rx meclizine PRN dizziness (can take for 1-3 days for now then PRN), also rx phenergan suppository for nausea (no vomiting) 3. Improve hydration 4. Return criteria given, consider future vestibular rehab if needed

## 2016-05-31 ENCOUNTER — Encounter: Payer: Self-pay | Admitting: Internal Medicine

## 2016-06-23 ENCOUNTER — Ambulatory Visit (INDEPENDENT_AMBULATORY_CARE_PROVIDER_SITE_OTHER): Payer: Medicaid Other | Admitting: Family Medicine

## 2016-06-23 ENCOUNTER — Encounter: Payer: Self-pay | Admitting: Family Medicine

## 2016-06-23 VITALS — BP 128/72 | HR 82 | Temp 99.3°F | Ht 65.0 in | Wt 238.6 lb

## 2016-06-23 DIAGNOSIS — M5410 Radiculopathy, site unspecified: Secondary | ICD-10-CM

## 2016-06-23 DIAGNOSIS — L989 Disorder of the skin and subcutaneous tissue, unspecified: Secondary | ICD-10-CM

## 2016-06-23 NOTE — Progress Notes (Signed)
   Subjective: CC: skin nodule [id number] Meredith Maynard is a 50 y.o. female presenting to clinic today for same day appointment. PCP: Darci Needle, MD Concerns today include:  Spanish interpretation provided by [employer] interpreter Verdis Frederickson  1. Skin nodule Patient reports a skin nodule that has been present for several years.  She notes associated "zingers" down her right UE that are intermittent.  She denies weakness.  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  She is worried because the skin nodule has changed colors.  She reports TTP.  She does not wish to have it biopsied in clinic because she "has friends that had biopsies and then cancer spread through their bodies".  She wants to see a specialist for her skin and her shoulder.  Social History Reviewed. FamHx and MedHx reviewed.  Please see EMR.  ROS: Per HPI  Objective: Office vital signs reviewed. BP 128/72 (BP Location: Left Arm, Patient Position: Sitting, Cuff Size: Large)   Pulse 82   Temp 99.3 F (37.4 C) (Oral)   Ht 5\' 5"  (1.651 m)   Wt 238 lb 9.6 oz (108.2 kg)   BMI 39.71 kg/m   Physical Examination:  General: Awake, alert, obese, No acute distress MSK: FROM of c-spine, no bony abnormalities appreciated Skin: dry, intact, area of flat hyperpigmentation 3mmx11mm, + TTP, small nodule appreciated underneath area of hyperpigmentation  Assessment/ Plan: 50 y.o. female   1. Radiculopathy affecting upper extremity.  I suspect that she is having radiculopathy from her c-spine, likely from degenerative changes. - Consider cervical spine imaging vs right shoulder imaging.   - Ambulatory referral to Orthopedic Surgery - Return precautions reviewed  2. Skin lesion of back.  Offered biopsy in clinic.  Patient declined.  Would like specialist to evaluate. - Ambulatory referral to Dermatology - Follow up with PCP as needed for routine  care.  [name], DO PGY-3, [facility]

## 2016-08-01 ENCOUNTER — Ambulatory Visit (INDEPENDENT_AMBULATORY_CARE_PROVIDER_SITE_OTHER): Payer: Self-pay | Admitting: Family Medicine

## 2016-08-01 ENCOUNTER — Ambulatory Visit (HOSPITAL_COMMUNITY)
Admission: RE | Admit: 2016-08-01 | Discharge: 2016-08-01 | Disposition: A | Discharge status: Home / Self Care | Payer: Self-pay | Source: Ambulatory Visit | Attending: Family Medicine | Admitting: Family Medicine

## 2016-08-01 ENCOUNTER — Encounter: Payer: Self-pay | Admitting: Family Medicine

## 2016-08-01 VITALS — BP 125/68 | HR 82 | Temp 97.9°F | Wt 240.8 lb

## 2016-08-01 DIAGNOSIS — R079 Chest pain, unspecified: Secondary | ICD-10-CM | POA: Insufficient documentation

## 2016-08-01 DIAGNOSIS — R9431 Abnormal electrocardiogram [ECG] [EKG]: Secondary | ICD-10-CM | POA: Insufficient documentation

## 2016-08-01 NOTE — Assessment & Plan Note (Signed)
Patient is here with complaints of chest pain since yesterday. Patient is currently completely asymptomatic. EKG in office today appears unchanged from previous EKG approximately 6 months ago. Etiology of patient's symptoms are concerning for cardiac etiology as patient has significant risk factors like uncontrolled diabetes, hyperlipidemia, obesity, and (currently well treated) hypertension. Differential would include gastrointestinal source like reflux, respiratory (lung sounds clear on auscultation), or MSK (no tenderness to palpation).  - Referral to cardiology placed. - Strict return precautions and symptoms which would warrant direct presentation to ED discussed with patient. Patient stated her understanding.

## 2016-08-01 NOTE — Progress Notes (Signed)
CHEST PAIN Occurred suddenly; never had this before. Occurred last night around 11pm. Lasted around 30 minutes in duration (severe symptoms). Seemed to be bothering her all night (mild discomfort). Felt confused, and nervous. Felt her tongue was numb for a period of time. Felt unsteady on her feet. Has some mouth dryness.   Pain described as pressure and tightness. Some difficulty breathing. Some SOB this am with stairs. No symptoms like this currently.   Chest pain began 1 days ago. Pain is: left sided, pain is like "pressure" and discomfort How severe is the pain: 5-6 What worsens the pain: stairs What makes the pain better: rest Radiation: no  PMH History of blood clot or heart problems or aneurysms: none Uncontrolled DM: last A1c 10.5  Symptoms Nausea/vomiting: no Shortness of breath: yes Pleuritic pain: no Cough: no Swelling of legs: no Syncope: no Heart burn or food sticking: no Immobility: no  Review of Symptoms - see HPI PMH - Smoking status noted.    CC, SH/smoking status, and VS noted  Objective: BP 125/68   Pulse 82   Temp 97.9 F (36.6 C) (Oral)   Wt 240 lb 12.8 oz (109.2 kg)   LMP 07/31/2016 (Exact Date)   BMI 40.07 kg/m  General -- oriented x3, pleasant and cooperative. HEENT -- Head is normocephalic. PERRLA. EOMI. Ears, nose and throat were benign. No JVD. Neck -- supple; no bruits. Integument -- intact. No rash, erythema, or ecchymoses. Cap refill <2 sec. Chest -- good expansion. Lungs clear to auscultation. No tenderness to palpation. Cardiac -- RRR. No murmurs noted.  Abdomen -- soft, nontender. No masses palpable. Bowel sounds present. Extremeties - no tenderness or effusions noted. ROM good. Dorsalis pedis pulses present and symmetrical.    Assessment and plan:  Chest pain Patient is here with complaints of chest pain since yesterday. Patient is currently completely asymptomatic. EKG in office today appears unchanged from previous EKG  approximately 6 months ago. Etiology of patient's symptoms are concerning for cardiac etiology as patient has significant risk factors like uncontrolled diabetes, hyperlipidemia, obesity, and (currently well treated) hypertension. Differential would include gastrointestinal source like reflux, respiratory (lung sounds clear on auscultation), or MSK (no tenderness to palpation).  - Referral to cardiology placed. - Strict return precautions and symptoms which would warrant direct presentation to ED discussed with patient. Patient stated her understanding.   Orders Placed This Encounter  Procedures  . Ambulatory referral to Cardiology    Referral Priority:   Routine    Referral Type:   Consultation    Referral Reason:   Specialty Services Required    Requested Specialty:   Cardiology    Number of Visits Requested:   1  . EKG 12-Lead    Elberta Leatherwood, MD,MS,  PGY3 08/01/2016 7:06 PM

## 2016-08-01 NOTE — Patient Instructions (Addendum)
It was a pleasure seeing you today in our clinic. Today we discussed your chest pain. Here is the treatment plan we have discussed and agreed upon together:   - I have placed a referral to cardiology. They will contact you to set up an appointment within the week. - If you have any recurrent chest pain or worsening of symptoms, do not hesitate, and report to the emergency department immediately. - Continue taking all of your medications as prescribed.   Dolor de pecho inespecfico  (Nonspecific Chest Pain) El dolor de pecho puede deberse a muchas enfermedades diferentes. Siempre existe una posibilidad de que el dolor est relacionado con algo grave, como un infarto de miocardio o un cogulo sanguneo en los pulmones. Hay muchas enfermedades que no son potencialmente mortales que pueden causar dolor de [place]. Si tiene Social research officer, government de Engineer, building services, es muy importante que se controle con el mdico. CAUSAS  Las causas del dolor de pecho pueden ser las siguientes:  Acidez estomacal.  Neumona o bronquitis.  Ansiedad o estrs.  Inflamacin de la zona que rodea al corazn (pericarditis) o a los pulmones (pleuritis o pleuresa).  Un cogulo sanguneo en el pulmn.  Colapso de un pulmn (neumotrax), que puede aparecer de [employer] repentina por s solo (neumotrax espontneo) o debido a un traumatismo en el trax.  Culebrilla (virus de la varicela zster).  Infarto de miocardio.  Dao de los [place], los msculos y los cartlagos que conforman la pared torcica. Esto puede incluir lo siguiente:  Hematomas seos debido a lesiones.  Distensiones musculares o de los cartlagos por tos frecuente o repetida, o por exceso de trabajo.  Fractura de una o ms costillas.  Dolor de Database administrator debido a inflamacin (costocondritis). FACTORES DE RIESGO  Los factores de riesgo de tener dolor de pecho pueden incluir lo siguiente:  Actividades que incrementan el riesgo de sufrir traumatismos o lesiones en el  trax.  Infecciones o enfermedades respiratorias que causan tos frecuente.  Enfermedades o [employer] comidas que pueden causar Geographical information systems officer.  Enfermedades cardacas o antecedentes familiares de enfermedades cardacas.  Enfermedades o comportamientos de salud que aumentan el riesgo de tener un cogulo sanguneo.  Haber tenido varicela (varicela zster). SIGNOS Y SNTOMAS El dolor de pecho puede provocar las siguientes sensaciones:  Ardor u hormigueo en la superficie o en lo profundo del pecho.  Dolor opresivo, continuo o constrictivo.  Dolor vago o intenso que empeora al [employer], toser o inhalar profundamente.  Dolor que tambin se siente en la espalda, el cuello, el hombro o el brazo, o dolor que se irradia a cualquiera de estas zonas. El dolor de pecho puede aparecer y Armed forces operational officer, o bien puede ser constante. DIAGNSTICO Ileene Hutchinson se necesiten anlisis de laboratorio u otros estudios para Animator causa del Social research officer, government. El mdico puede indicarle que se haga una prueba llamada EGC (electrocadiograma) ambulatorio. El Radio broadcast assistant los patrones de los latidos cardacos en el momento en que se realiza el [place]. Tambin pueden hacerle otros estudios, por ejemplo:  Ecocardiograma transtorcico (ETT). Durante el ecocardiograma, se usan ondas sonoras para crear una imagen de todas las estructuras cardacas y evaluar cmo circula la sangre por el corazn.  Ecocardiograma transesofgico (ETE).Este es un estudio de diagnstico por imgenes ms avanzado que el obtiene imgenes del interior del cuerpo. Le permite al mdico ver el corazn con mayor detalle.  Monitoreo cardaco. Permite que el mdico controle la frecuencia y el ritmo cardaco en tiempo real.  Monitor Holter. Es un dispositivo porttil que Development worker, community  los latidos del corazn y puede ayudar a Retail buyer las arritmias cardacas. Le permite al [employer] registrar la actividad [place], si es  necesario.  Pruebas de esfuerzo. Estas pueden realizarse durante el ejercicio o mediante la administracin de un medicamento que acelera los latidos del corazn.  Anlisis de [place].  Diagnstico por imgenes. TRATAMIENTO  El tratamiento depende de la causa del dolor de [place]. El tratamiento puede incluir lo siguiente:  Medicamentos. Estos pueden incluir lo siguiente:  Inhibidores de Psychologist, forensic.  Antiinflamatorios.  Analgsicos para las enfermedades inflamatorias.  Antibiticos, si hay una infeccin.  Medicamentos para [employer]  Medicamentos para tratar la enfermedad arterial coronaria.  Tratamiento complementario para las enfermedades que no requieren la toma de medicamentos. Esto puede incluir lo siguiente:  Descansar.  Aplicar compresas fras o calientes en las zonas lesionadas.  Limitar las actividades hasta que [employer]. INSTRUCCIONES PARA EL CUIDADO EN EL HOGAR  Si le recetaron antibiticos, asegrese de terminarlos, incluso si comienza a sentirse mejor.  Evite las [employer] causen dolor de [place].  No consuma ningn producto que contenga tabaco, lo que incluye cigarrillos, tabaco de Higher education careers adviser o Psychologist, sport and exercise. Si necesita ayuda para dejar de fumar, consulte al mdico.  No beba alcohol.  Tome los medicamentos solamente como se lo haya indicado el mdico.  Concurra a todas las visitas de control como se lo haya indicado el mdico. Esto es importante. Esto incluye otros estudios si el dolor de pecho no desaparece.  Si la acidez es la causa del dolor de [place], tal vez le aconsejen que mantenga la cabeza levantada (elevada) mientras duerme. Esto reduce la probabilidad de que el cido retroceda del estmago al esfago.  Haga cambios en su estilo de vida como se lo haya indicado el mdico. Estos pueden incluir lo siguiente:  Psychologist, prison and probation services actividad fsica con regularidad. Pida al mdico que le sugiera algunas  actividades que sean seguras para usted.  Consumir una dieta cardiosaludable. Un nutricionista matriculado puede ayudarlo a Adult nurse saludables.  Mantener un peso saludable.  Controlar la diabetes, si es necesario.  Reducir las situaciones de estrs. SOLICITE ATENCIN MDICA SI:  El dolor de pecho no desaparece despus del tratamiento.  Tiene una erupcin cutnea con ampollas en el pecho.  Tiene fiebre. SOLICITE ATENCIN MDICA DE INMEDIATO SI:   El dolor en el pecho es ms intenso.  La tos empeora, o expectora sangre.  Siente un dolor abdominal intenso.  Siente debilidad intensa.  Se desmaya.  Tiene escalofros.  Tiene una molestia repentina e inexplicable en el pecho.  Tiene molestias repentinas e [employer], la espalda, el cuello o la [place].  Le falta el aire en cualquier momento.  Comienza a sudar de [place] repentina o la piel se le humedece.  Siente nuseas o vomita.  Se siente repentinamente mareado o se desmaya.  Siente que el corazn comienza a latir rpidamente o que se saltea latidos. Estos sntomas pueden representar un problema grave que constituye Engineer, maintenance (IT). No espere hasta que los sntomas desaparezcan. Solicite atencin mdica de inmediato. Comunquese con el servicio de emergencias de su localidad (911 en los [place]). No conduzca por sus propios medios [employer].   Esta informacin no tiene Marine scientist el consejo del mdico. Asegrese de hacerle al mdico cualquier pregunta que tenga.   Document Released: 10/17/2005 Document Revised: 11/07/2014 Elsevier Interactive Patient Education [employer].

## 2016-08-10 ENCOUNTER — Ambulatory Visit: Payer: Self-pay | Attending: Internal Medicine

## 2016-08-10 ENCOUNTER — Ambulatory Visit: Payer: Self-pay | Admitting: Physician Assistant

## 2016-08-31 ENCOUNTER — Ambulatory Visit (INDEPENDENT_AMBULATORY_CARE_PROVIDER_SITE_OTHER): Payer: Self-pay | Admitting: Family Medicine

## 2016-08-31 VITALS — BP 120/67 | HR 92 | Temp 98.3°F | Wt 230.0 lb

## 2016-08-31 DIAGNOSIS — J069 Acute upper respiratory infection, unspecified: Secondary | ICD-10-CM

## 2016-08-31 DIAGNOSIS — B9789 Other viral agents as the cause of diseases classified elsewhere: Secondary | ICD-10-CM

## 2016-08-31 DIAGNOSIS — M791 Myalgia, unspecified site: Secondary | ICD-10-CM

## 2016-08-31 DIAGNOSIS — E669 Obesity, unspecified: Secondary | ICD-10-CM

## 2016-08-31 DIAGNOSIS — E1169 Type 2 diabetes mellitus with other specified complication: Secondary | ICD-10-CM

## 2016-08-31 LAB — POCT GLYCOSYLATED HEMOGLOBIN (HGB A1C): HEMOGLOBIN A1C: 9

## 2016-08-31 NOTE — Patient Instructions (Signed)
Infección del tracto respiratorio superior, adultos  (Upper Respiratory Infection, Adult)  La mayoría de las infecciones del tracto respiratorio superior son infecciones virales de las vías que llevan el aire a los pulmones. Un infección del tracto respiratorio superior afecta la nariz, la garganta y las vías respiratorias superiores. El tipo más frecuente de infección del tracto respiratorio superior es la nasofaringitis, que habitualmente se conoce como "resfrío común".  Las infecciones del tracto respiratorio superior siguen su curso y por lo general se curan solas. En la mayoría de los casos, la infección del tracto respiratorio superior no requiere atención médica, pero a veces, después de una infección viral, puede surgir una infección bacteriana en las vías respiratorias superiores. Esto se conoce como infección secundaria. Las infecciones sinusales y en el oído medio son tipos frecuentes de infecciones secundarias en el tracto respiratorio superior.  La neumonía bacteriana también puede complicar un cuadro de infección del tracto respiratorio superior. Este tipo de infección puede empeorar el asma y la enfermedad pulmonar obstructiva crónica (EPOC). En algunos casos, estas complicaciones pueden requerir atención médica de emergencia y poner en peligro la vida.   CAUSAS  Casi todas las infecciones del tracto respiratorio superior se deben a los virus. Un virus es un tipo de microbio que puede contagiarse de una persona a otra.   FACTORES DE RIESGO  Puede estar en riesgo de sufrir una infección del tracto respiratorio superior si:   · Fuma.  · Tiene una enfermedad pulmonar o cardíaca crónica.  · Tiene debilitado el sistema de defensa (inmunitario) del cuerpo.  · Es muy joven o de edad muy avanzada.  · Tiene asma o alergias nasales.  · Trabaja en áreas donde hay mucha gente o poca ventilación.  · Trabaja en una escuela o en un centro de atención médica.  SIGNOS Y SÍNTOMAS   Habitualmente, los síntomas aparecen  de 2 a 3 días después de entrar en contacto con el virus del resfrío. La mayoría de las infecciones virales en el tracto respiratorio superior duran de 7 a 10 días. Sin embargo, las infecciones virales en el tracto respiratorio superior a causa del virus de la gripe pueden durar de 14 a 18 días y, habitualmente, son más graves. Entre los síntomas se pueden incluir los siguientes:   · Secreción o congestión nasal.  · Estornudos.  · Tos.  · Dolor de garganta.  · Dolor de cabeza.  · Fatiga.  · Fiebre.  · Pérdida del apetito.  · Dolor en la frente, detrás de los ojos y por encima de los pómulos (dolor sinusal).  · Dolores musculares.  DIAGNÓSTICO   El médico puede diagnosticar una infección del tracto respiratorio superior mediante los siguientes estudios:  · Examen físico.  · Pruebas para verificar si los síntomas no se deben a otra afección, por ejemplo:    Faringitis estreptocócica.    Sinusitis.    Neumonía.    Asma.  TRATAMIENTO   Esta infección desaparece sola, con el tiempo. No puede curarse con medicamentos, pero a menudo se prescriben para aliviar los síntomas. Los medicamentos pueden ser útiles para lo siguiente:  · Bajar la fiebre.  · Reducir la tos.  · Aliviar la congestión nasal.  INSTRUCCIONES PARA EL CUIDADO EN EL HOGAR   · Tome los medicamentos solamente como se lo haya indicado el médico.  · A fin de aliviar el dolor de garganta, haga gárgaras con solución salina templada o consuma caramelos para la tos, como se lo haya indicado el médico.  · Use un humidificador   de vapor cálido o inhale el vapor de la ducha para aumentar la humedad del aire. Esto facilitará la respiración.  · Beba suficiente líquido para mantener la orina clara o de color amarillo pálido.  · Consuma sopas y otros caldos transparentes, y aliméntese bien.  · Descanse todo lo que sea necesario.  · Regrese al trabajo cuando la temperatura se le haya normalizado o cuando el médico lo autorice. Es posible que deba quedarse en su casa durante  un tiempo prolongado, para no infectar a los demás. También puede usar un barbijo y lavarse las manos con cuidado para evitar la propagación del virus.  · Aumente el uso del inhalador si tiene asma.  · No consuma ningún producto que contenga tabaco, lo que incluye cigarrillos, tabaco de mascar o cigarrillos electrónicos. Si necesita ayuda para dejar de fumar, consulte al médico.  PREVENCIÓN   La mejor manera de protegerse de un resfrío es mantener una higiene adecuada.   · Evite el contacto oral o físico con personas que tengan síntomas de resfrío.  · En caso de contacto, lávese las manos con frecuencia.  No hay pruebas claras de que la vitamina C, la vitamina E, la equinácea o el ejercicio reduzcan la probabilidad de contraer un resfrío. Sin embargo, siempre se recomienda descansar mucho, hacer ejercicio y alimentarse bien.   SOLICITE ATENCIÓN MÉDICA SI:   · Su estado empeora en lugar de mejorar.  · Los medicamentos no logran controlar los síntomas.  · Tiene escalofríos.  · La sensación de falta de aire empeora.  · Tiene mucosidad marrón o roja.  · Tiene secreción nasal amarilla o marrón.  · Le duele la cara, especialmente al inclinarse hacia adelante.  · Tiene fiebre.  · Tiene los ganglios del cuello hinchados.  · Siente dolor al tragar.  · Tiene zonas blancas en la parte de atrás de la garganta.  SOLICITE ATENCIÓN MÉDICA DE INMEDIATO SI:   · Tiene síntomas intensos o persistentes de:    Dolor de cabeza.    Dolor de oídos.    Dolor sinusal.    Dolor en el pecho.  · Tiene enfermedad pulmonar crónica y cualquiera de estos síntomas:    Sibilancias.    Tos prolongada.    Tos con sangre.    Cambio en la mucosidad habitual.  · Presenta rigidez en el cuello.  · Tiene cambios en:    La visión.    La audición.    El pensamiento.    El estado de ánimo.  ASEGÚRESE DE QUE:   · Comprende estas instrucciones.  · Controlará su afección.  · Recibirá ayuda de inmediato si no mejora o si empeora.     Esta información no tiene como  fin reemplazar el consejo del médico. Asegúrese de hacerle al médico cualquier pregunta que tenga.     Document Released: 07/27/2005 Document Revised: 03/03/2015  Elsevier Interactive Patient Education ©2016 Elsevier Inc.

## 2016-08-31 NOTE — [place] Notes (Signed)
   Subjective:   Meredith Maynard is a 50 y.o. female with a history of HTN, T2 DM, obesity, HLD, anxiety here for same day appointment for body aches  History obtained with assistance of Spanish Interpreter [phone]  Patient reports that she came home from work about 2 days ago after feeling well during the day and started having subjective chills and fever. This is continued over the last 2 days in addition to body aches, intermittent diarrhea, headaches. She is also having generalized stomach discomfort and decreased appetite. Her son was sick with similar symptoms about 2 weeks ago but also had a throat infection. She is taking a couple doses of Advil which seemed to help a little bit. She denies any cough, sore throat, difficulty breathing, chest pain  Review of Systems:  Per HPI.   Social History: never smoker  Objective:  LMP 07/31/2016 (Exact Date)   Gen:  50 y.o. female in NAD HEENT: NCAT, MMM, EOMI, PERRL, anicteric sclerae, OP clear, TMs clear bilaterally Neck: No LAD CV: RRR, no MRG Resp: Non-labored, CTAB, no wheezes noted Abd: Soft, NTND, BS present, no guarding or organomegaly Ext: WWP, no edema MSK: No obvious deformities, tender to palpation over large muscle groups, gait intact Neuro: Alert and oriented, speech normal    Assessment & Plan:     Meredith Maynard is a 50 y.o. female here for   History and exam c/w viral URI with myalgias Discussed natural course and symptomatic management Return precautions discussed including signs of dehydration and difficulty breathing  Shamaine Crews, MD MPH PGY-3,  [facility] 08/31/2016  10:21 AM

## 2016-09-05 ENCOUNTER — Ambulatory Visit (INDEPENDENT_AMBULATORY_CARE_PROVIDER_SITE_OTHER): Payer: Self-pay | Admitting: Family Medicine

## 2016-09-05 VITALS — BP 115/62 | HR 91 | Temp 98.5°F | Wt 230.0 lb

## 2016-09-05 DIAGNOSIS — R32 Unspecified urinary incontinence: Secondary | ICD-10-CM

## 2016-09-05 DIAGNOSIS — R102 Pelvic and perineal pain: Secondary | ICD-10-CM | POA: Insufficient documentation

## 2016-09-05 DIAGNOSIS — R300 Dysuria: Secondary | ICD-10-CM

## 2016-09-05 LAB — POCT WET PREP (WET MOUNT)
Clue Cells Wet Prep Whiff POC: NEGATIVE
Trichomonas Wet Prep HPF POC: ABSENT

## 2016-09-05 LAB — POCT URINALYSIS DIPSTICK
BILIRUBIN UA: NEGATIVE
GLUCOSE UA: 500
Leukocytes, UA: NEGATIVE
NITRITE UA: NEGATIVE
Protein, UA: 30
SPEC GRAV UA: 1.02
Urobilinogen, UA: 1
pH, UA: 6

## 2016-09-05 LAB — POCT UA - MICROSCOPIC ONLY

## 2016-09-05 NOTE — [place] Notes (Signed)
Subjective: CC: pelvic pain Utilized spanish interpreter Shawnie Pons [phone] HPI: Patient is a 50 y.o. female with a past medical history of HTN, GERD, recurrent ventral incisional hernia with obstruction, T2DM,  presenting to clinic today for pelvic pain.  Pelvic pain for 3 days that is characterized as "burning sensation." Everytime she feels she needs to urinate she has this sensation. She feels like when her bladder is full, this is when she notes the burning. She had URI recently and still feels she has subjective fevers and chills. She denies N/V. Notes occasional diarrhea. Diarrhea makes the pelvic "colic" worse. She has a poor appetite but this is stable since her URI.   She has a pink vaginal vaginal discharge that started today. She's had vaginal infections in the past for which she has been treated which she was told could be due to soap or the toilet paper she used.   She notes that she has a "burning sensation" since a hernia surgery in March. She notes the difference is that now the pain is more severe.   LMP 08/26/16, typically lasts 5 days, was 8 days most recently.   Advil and Ibuprofen reduces the pain but doesn't resolve the pain.   Social History: non smoker  Health Maintenance:  Due to for influenza vaccine.   ROS: All other systems reviewed and are negative.  Past Medical History Patient Active Problem List   Diagnosis Date Noted  . Pelvic pain 09/05/2016  . BPPV (benign paroxysmal positional vertigo) 02/02/2016  . Recurrent incisional hernia with incarceration 01/22/2016  . Recurrent ventral incisional hernia with obstruction 01/04/2016  . Cough 12/17/2015  . Eye pain 02/20/2015  . Onychomycosis 02/20/2015  . Unspecified constipation 05/26/2014  . Abdominal pain, epigastric 11/28/2013  . Musculoskeletal pain 08/14/2013  . Health care maintenance 04/25/2013  . Trigger finger, acquired 04/25/2013  . Fatigue 04/24/2013  . Status post tooth extraction  11/21/2012  . Plantar fasciitis of right foot 07/31/2012  . Right leg pain 07/08/2012  . LUQ abdominal pain 04/30/2012  . Multiple joint pain 04/11/2012  . Chest pain 12/14/2011  . Shoulder mass 12/08/2011  . Irregular periods/menstrual cycles 09/28/2011  . Leg length discrepancy 09/27/2011  . Pes planus 09/27/2011  . Back pain 08/31/2011  . 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 Umbilical hernia [id number]  . Sciatica of right side 04/18/2011  . GERD 04/08/2009  . Hyperlipidemia 03/20/2009  . UNSPECIFIED HEARING LOSS 12/31/2008  . Diabetes mellitus type 2 in obese ([place]) 01/17/2007  . DIZZINESS, CHRONIC 01/17/2007  . OBESITY, NOS 12/28/2006  . ANEMIA, IRON DEFICIENCY, UNSPEC. 12/28/2006  . ANXIETY 12/28/2006  . HYPERTENSION, BENIGN SYSTEMIC 12/28/2006    Medications- reviewed and updated  Objective: Office vital signs reviewed. BP 115/62   Pulse 91   Temp 98.5 F (36.9 C)   Wt 230 lb (104.3 kg)   BMI 38.27 kg/m    Physical Examination:  General: Awake, alert, well- nourished, NAD GI: well healed surgical scars. +BS, soft, NT/ND. GYN performed with chaperone:  External genitalia within normal limits.  Vaginal mucosa pink, moist, normal rugae. No pelvic floor discomfort on palpation.  Nonfriable cervix without lesions. Small amount of yellow/pink discharge noted on speculum exam.  Bimanual exam revealed some tenderness over bladder/uterus.  No cervical motion tenderness. No adnexal masses bilaterally however difficult exam due to habitus.   Wet prep: few bacteria, 1-5 WBCs U/A: trace bacteria, trace ketones, 500 glucose (previously had 1000), 2+ mucus, negative LE, neg  nitrite, trace intact RBC, rare RBC on micro   Assessment/Plan: Pelvic pain Present since surgery but worsened over the last 3 days. No evidence of UTI or pelvic infection. Given her symptoms, I suspect this could be related to previous surgery vs decreased detrusor compliance. Fortunately, given continued BMs  and abdominal exam, obstruction is less likely. She has a non-surgical abdomen on exam.  Upon further questioning, patient does note issues recently with some urinary urgency and incontinence. She cannot tell me if she feels like she empties her bladder all the way. No fevers or chills. This could also be endometritis, however exam is pretty benign today and pt did not feel STD testing was warranted.  - referral to urology  - advised pt to touch base with surgery as all these symptoms occurred after the procedure.  - discussed continued monitoring of when symptoms occur - pt to f/u with PCP on Wednesday for pap smear, advised to keep this appt to determine how her symptoms are progressing > could consider STD testing vs other work up at that time.   Orders Placed This Encounter  Procedures  . Urinalysis Dipstick  . POCT UA - Microscopic Only  . POCT Wet Prep [facility])    No orders of the defined types were placed in this encounter.   Archie Patten PGY-3, [place]

## 2016-09-05 NOTE — Patient Instructions (Signed)
Dolor plvico, mujeres (Pelvic Pain, Female)  Las causa del dolor plvico en la mujer pueden ser muchas y pueden tener su origen en diferentes lugares. El dolor plvico es el que aparece en la mitad inferior del abdomen y [place] caderas. Puede aparecer durante en un perodo corto de tiempo (agudo)o puede ser recurrente (crnico). Esta afeccin puede estar relacionada con trastornos que afectan a los rganos reproductivos femeninos (ginecolgica), pero tambin puede deberse a problemas en la vejiga, clculos renales, complicaciones intestinales, o problemas musculares o esquelticos. Es Materials engineer ayuda de inmediato, sobre todo si ha sido intenso, [place], o ha aparecido de Geographical information systems officer sbita como un dolor inusual. Tambin es importante obtener ayuda de inmediato, ya que algunos tipos de dolor plvico puede poner en peligro la vida.  CAUSAS  A continuacin veremos algunas de las causas del dolor plvico. Las causas pueden clasificarse de diferentes modos.   Ginecolgica.  Enfermedad inflamatoria plvica.  Infecciones de transmisin sexual.  Quiste de ovario o torsin de un ligamento ovrico ( torsin ovrica).  La membrana que recubre internamente al tero desarrollndose fuera del tero (endometriosis).  Fibromas, quistes o tumores.  Ovulacin.  Embarazo.  Embarazo fuera del tero (embarazo ectpico).  Aborto espontneo.  Trabajo de [place].  Desprendimiento de la placenta o ruptura del tero.  Infecciones.  Infeccin uterina (endometritis).  Infeccin de la vejiga.  Diverticulitis.  Aborto relacionado con una infeccin uterina (aborto sptico).  Vejiga.  Inflamacin de la vejiga (cistitis).  Clculos renales.  Gastrointenstinal.  Estreimiento.  Diverticulitis.  Neurolgico.  Traumatismos.  Sentir dolor plvico debido a causas mentales o emocionales (psicosomtico).  Tumores en el intestino o en la pelvis. EVALUACIN  El mdico har una historia  clnica detallada segn sus sntomas. Incluir los cambios recientes en su salud, una cuidadosa historia ginecolgica de sus periodos (menstruaciones) y Ardelia Mems historia de su actividad sexual. Los antecedentes familiares y la historia clnica tambin son importantes. Su mdico podr indicar un examen plvico. El examen plvico ayudar a identificar la ubicacin y la gravedad del Social research officer, government. Tambin ayudar a [employer] rganos que pueden estar involucrados. Romie Minus identificar la causa del dolor plvico y tratarlo adecuadamente, el mdico puede indicar estudios. Estas pruebas pueden ser:   Test de embarazo.  Ecografa plvica.  Radiografa del abdomen.  Un anlisis de [place] o la evaluacin de la secrecin vaginal.  Anlisis de [place]. INSTRUCCIONES PARA EL CUIDADO EN EL HOGAR   Solo tome medicamentos de venta libre o recetados para Conservation officer, historic buildings, Tree surgeon o fiebre, segn las indicaciones del mdico.   Haga reposo segn las indicaciones del mdico.   Consuma una dieta balanceada.   Beba gran cantidad de lquido para mantener la orina de tono claro o amarillo plido.   Evite las relaciones sexuales, Higher education careers adviser.   Aplique compresas calientes o fras en la zona baja del abdomen segn cual le calme el dolor.   Evite las situaciones estresantes.   Lleve un registro del dolor plvico. Anote cundo comenz, dnde se Midwife y si hay cosas que parecen estar asociadas con el dolor, como algn alimento o su ciclo menstrual.  Concurra a las visitas de control con el mdico, segn las indicaciones.  SOLICITE ATENCIN MDICA SI:   Los medicamentos no Buyer, retail.  Tiene flujo vaginal anormal. SOLICITE ATENCIN MDICA DE INMEDIATO SI:   Tiene un sangrado abundante por la vagina.   El dolor plvico aumenta.   Se siente mareada o sufre un desmayo.  Siente escalofros.   Siente dolor intenso al Garment/textile technologist u observa sangre en la orina.   [place] que  no puede controlar.   Tiene fiebre o sntomas que persisten durante ms de 3 das.  Tiene fiebre y los sntomas empeoran.   Ha sido abusada fsica o sexualmente.  ASEGRESE DE QUE:   Comprende estas instrucciones.  Controlar su enfermedad.  Solicitar ayuda de inmediato si no mejora o si empeora.   Esta informacin no tiene Marine scientist el consejo del mdico. Asegrese de hacerle al mdico cualquier pregunta que tenga.   Document Released: 01/13/2009 Document Revised: 03/03/2015 Elsevier Interactive Patient Education [employer].

## 2016-09-05 NOTE — Assessment & Plan Note (Signed)
Present since surgery but worsened over the last 3 days. No evidence of UTI or pelvic infection. Given her symptoms, I suspect this could be related to previous surgery vs decreased detrusor compliance. Fortunately, given continued BMs and abdominal exam, obstruction is less likely. She has a non-surgical abdomen on exam.  Upon further questioning, patient does note issues recently with some urinary urgency and incontinence. She cannot tell me if she feels like she empties her bladder all the way. No fevers or chills. This could also be endometritis, however exam is pretty benign today and pt did not feel STD testing was warranted.  - referral to urology  - advised pt to touch base with surgery as all these symptoms occurred after the procedure.  - discussed continued monitoring of when symptoms occur - pt to f/u with PCP on Wednesday for pap smear, advised to keep this appt to determine how her symptoms are progressing > could consider STD testing vs other work up at that time.

## 2016-09-07 ENCOUNTER — Encounter: Payer: Self-pay | Admitting: Internal Medicine

## 2016-09-07 ENCOUNTER — Other Ambulatory Visit (HOSPITAL_COMMUNITY)
Admission: RE | Admit: 2016-09-07 | Discharge: 2016-09-07 | Disposition: A | Discharge status: Home / Self Care | Payer: No Typology Code available for payment source | Source: Ambulatory Visit | Attending: Family Medicine | Admitting: Family Medicine

## 2016-09-07 ENCOUNTER — Ambulatory Visit (INDEPENDENT_AMBULATORY_CARE_PROVIDER_SITE_OTHER): Payer: No Typology Code available for payment source | Admitting: Internal Medicine

## 2016-09-07 VITALS — BP 121/75 | HR 94 | Temp 99.0°F | Ht 64.0 in | Wt 232.6 lb

## 2016-09-07 DIAGNOSIS — R1084 Generalized abdominal pain: Secondary | ICD-10-CM

## 2016-09-07 DIAGNOSIS — Z1151 Encounter for screening for human papillomavirus (HPV): Secondary | ICD-10-CM | POA: Insufficient documentation

## 2016-09-07 DIAGNOSIS — E669 Obesity, unspecified: Secondary | ICD-10-CM

## 2016-09-07 DIAGNOSIS — Z6839 Body mass index (BMI) 39.0-39.9, adult: Secondary | ICD-10-CM

## 2016-09-07 DIAGNOSIS — Z113 Encounter for screening for infections with a predominantly sexual mode of transmission: Secondary | ICD-10-CM | POA: Insufficient documentation

## 2016-09-07 DIAGNOSIS — E6609 Other obesity due to excess calories: Secondary | ICD-10-CM

## 2016-09-07 DIAGNOSIS — D649 Anemia, unspecified: Secondary | ICD-10-CM

## 2016-09-07 DIAGNOSIS — Z01419 Encounter for gynecological examination (general) (routine) without abnormal findings: Secondary | ICD-10-CM | POA: Insufficient documentation

## 2016-09-07 DIAGNOSIS — Z23 Encounter for immunization: Secondary | ICD-10-CM

## 2016-09-07 DIAGNOSIS — E1169 Type 2 diabetes mellitus with other specified complication: Secondary | ICD-10-CM

## 2016-09-07 DIAGNOSIS — Z124 Encounter for screening for malignant neoplasm of cervix: Secondary | ICD-10-CM

## 2016-09-07 MED ORDER — POLYETHYLENE GLYCOL 3350 17 G PO PACK: 17.0000 g | PACK | Freq: Every day | ORAL | 0 refills | Status: DC

## 2016-09-07 NOTE — Patient Instructions (Signed)
Ms. Lupoli,  I will call you with lab results from today.  Please make lab appointment to check cholesterol and anemia.  You will get a call about making an appointment with general surgery in about 1 week.  Please make a follow-up appointment in about 2 weeks to discuss diabetes and how miralax has worked.  Take a teaspoon of miralax daily mixed in any liquid. Drink at least 8 cups of water a day.  Best, Dr. Ola Spurr

## 2016-09-07 NOTE — Progress Notes (Signed)
[facility] Progress Note  Subjective:  Meredith Maynard is a 50 y.o. female with history of HTN, T2DM, obesity, HLD, recurrent incisional hernia, and anxiety who presents with concerns about abdominal pain and needing pap smear. Her daughter is present and served as Administrator, sports at patient's request.   Abdominal pain: - Was seen earlier this week for pelvic pain described as a sensation of burning when bladder is full. Had UA negative for nitrites/leukocytes and negative wet prep at that time. No longer with burning sensation but some stinging around rectum and pressure in bladder when she strains to have a BM.  - Main concern is that something is wrong after mesh placed with last hernia repair in March this year; would like to be evaluated by Surgery. Notes multiple abdominal surgeries -- 3 c-sections and 2 hernia repairs.  - She also is worried she could have cancer, reporting friends who have died from colon cancer and said they had mild abdominal pains before being diagnosed and then it was too late. She says she does not have any family members who had colon cancer.  - Last 4 days she has had multiple hard stools and occasional diarrhea. Hard stools hurt some to pass but no blood. - Daughter says she is having a lot of gas ROS: No decreased appetite, no n/v/d, no melena or hematochezia   T2DM: - hgb A1c 9.0 on 08/31/16, improved from 10.5 12/2015 - Takes metformin 1000 mg BID - Is prescribed glipizide 5 mg daily but does not regularly take it as it makes her feel unwell with "hot flashes" and "spots in her vision" - Does not check her blood sugars - Says her sugars are not well controlled because she "does not take care of herself" and eats a lot of foods like tortillas, per her daughter  Past Medical History:  Diagnosis Date  . Anxiety   . Fatty liver   . Gastritis   . GERD (gastroesophageal reflux disease)   . HLD (hyperlipidemia)   . Hypertension    . Meniere syndrome   . Panic attacks   . Somatic complaints, multiple   . T2DM (type 2 diabetes mellitus) ([place])    Social: Never smoker Works as Secretary/administrator  Allergies  Allergen Reactions  . Latex Rash    Objective: Blood pressure 121/75, pulse 94, temperature 99 F (37.2 C), temperature source Oral, height 5\' 4"  (1.626 m), weight 232 lb 9.6 oz (105.5 kg), last menstrual period 09/01/2016. Body mass index is 39.93 kg/m. Constitutional: Obese female, in NAD Cardiovascular: RRR, S1, S2, no m/r/g.  Pulmonary/Chest: Effort normal and breath sounds normal. No respiratory distress.  Abdominal: Soft. +BS, NT, ND, no rebound or guarding. Area of firmness over ventral hernia scar.  GU: No lesions and scant discharge on speculum exam. No cervical motion tenderness on bimanual exam. No anal fissures noted.  Neurological: AOx3, no focal deficits. Skin: Skin is warm and dry. No rash noted. No erythema.  Psychiatric: Very anxious.  Vitals reviewed  Assessment/Plan: Abdominal pain - History and reassuring exam make constipation seem likely. Asked patient to try miralax daily, increasing until she has 1 soft stool a day, with plenty of water and to follow-up to discuss symptoms. - Will place referral to general surgery per patient request for 2nd opinion on whether she is having complications from mesh - Will obtain gc/chlamydia tests with pap smear today, though no cervical motion tenderness on exam to suggest PID - Would hold off on  GI or Urology referrals until constipation has been ruled out.  - Given lack of family history for colon cancer, would not recommend colonoscopy until age 42.  Diabetes mellitus type 2 in obese - Will not refill glipizide, as patient not taking - Broached adding another medication like a GLP-1 agonist, but patient very wary of side effects - Recommended close follow-up to more fully counsel on range of options for improved blood sugar control and see what  barrier patient has to making healthier dietary choices  Follow-up in 2 weeks to see if miralax helped and discuss changing medications to better control diabetes. May benefit from [facility] clinic in future to better understand her multiple concerns. Would do GAD-7 and PHQ-9 testing at next visit, restricted by time today.   Placed future orders for lipid panel and CBC (post-surgery anemia without recheck), as lab had closed by end of encounter.   Olene Floss, MD [place], PGY-2

## 2016-09-09 LAB — CYTOLOGY - PAP
CHLAMYDIA, DNA PROBE: NEGATIVE
DIAGNOSIS: NEGATIVE
HPV (WINDOPATH): NOT DETECTED
NEISSERIA GONORRHEA: NEGATIVE

## 2016-09-09 NOTE — Assessment & Plan Note (Signed)
-   Will not refill glipizide, as patient not taking - Broached adding another medication like a GLP-1 agonist, but patient very wary of side effects - Recommended close follow-up to more fully counsel on range of options for improved blood sugar control and see what barrier patient has to making healthier dietary choices

## 2016-09-09 NOTE — Assessment & Plan Note (Addendum)
-   History and reassuring exam make constipation seem likely. Asked patient to try miralax daily, increasing until she has 1 soft stool a day, with plenty of water and to follow-up to discuss symptoms. - Will place referral to general surgery per patient request for 2nd opinion on whether she is having complications from mesh - Will obtain gc/chlamydia tests with pap smear today, though no cervical motion tenderness on exam to suggest PID - Would hold off on GI or Urology referrals until constipation has been ruled out.  - Given lack of family history for colon cancer, would not recommend colonoscopy until age 59.

## 2016-09-14 ENCOUNTER — Encounter: Payer: Self-pay | Admitting: Internal Medicine

## 2016-09-14 NOTE — Progress Notes (Signed)
Attempted to reach patient by phone with Spanish interpreter but no answer or voicemail set up.

## 2016-10-14 ENCOUNTER — Other Ambulatory Visit: Payer: No Typology Code available for payment source

## 2016-10-14 DIAGNOSIS — E6609 Other obesity due to excess calories: Secondary | ICD-10-CM

## 2016-10-14 DIAGNOSIS — Z23 Encounter for immunization: Secondary | ICD-10-CM

## 2016-10-14 DIAGNOSIS — D649 Anemia, unspecified: Secondary | ICD-10-CM

## 2016-10-14 DIAGNOSIS — Z6839 Body mass index (BMI) 39.0-39.9, adult: Principal | ICD-10-CM

## 2016-10-14 LAB — LIPID PANEL
CHOL/HDL RATIO: 3.2 ratio (ref <5.0)
Cholesterol: 165 mg/dL (ref <200)
HDL: 51 mg/dL (ref >50)
LDL Cholesterol: 81 mg/dL (ref <100)
Triglycerides: 166 mg/dL — ABNORMAL HIGH (ref <150)
VLDL: 33 mg/dL — AB (ref <30)

## 2016-10-15 LAB — CBC
HCT: 29.4 % — ABNORMAL LOW (ref 35.0–45.0)
Hemoglobin: 8.2 g/dL — ABNORMAL LOW (ref 11.7–15.5)
MCH: 17.9 pg — ABNORMAL LOW (ref 27.0–33.0)
MCHC: 27.9 g/dL — ABNORMAL LOW (ref 32.0–36.0)
MCV: 64.2 fL — ABNORMAL LOW (ref 80.0–100.0)
PLATELETS: 284 10*3/uL (ref 140–400)
RBC: 4.58 MIL/uL (ref 3.80–5.10)
RDW: 17.7 % — ABNORMAL HIGH (ref 11.0–15.0)
WBC: 6.7 10*3/uL (ref 3.8–10.8)

## 2016-10-26 ENCOUNTER — Encounter: Payer: Self-pay | Admitting: Internal Medicine

## 2016-10-26 ENCOUNTER — Ambulatory Visit (INDEPENDENT_AMBULATORY_CARE_PROVIDER_SITE_OTHER): Payer: No Typology Code available for payment source | Admitting: Internal Medicine

## 2016-10-26 VITALS — BP 116/72 | HR 89 | Temp 98.3°F | Ht 65.0 in | Wt 233.0 lb

## 2016-10-26 DIAGNOSIS — D649 Anemia, unspecified: Secondary | ICD-10-CM

## 2016-10-26 LAB — FOLATE: Folate: 11.3 ng/mL (ref >5.4)

## 2016-10-26 LAB — COMPLETE METABOLIC PANEL WITH GFR
ALT: 33 U/L — ABNORMAL HIGH (ref 6–29)
AST: 24 U/L (ref 10–35)
Albumin: 3.7 g/dL (ref 3.6–5.1)
Alkaline Phosphatase: 130 U/L (ref 33–130)
BUN: 14 mg/dL (ref 7–25)
CHLORIDE: 102 mmol/L (ref 98–110)
CO2: 26 mmol/L (ref 20–31)
Calcium: 9.2 mg/dL (ref 8.6–10.4)
Creat: 0.55 mg/dL (ref 0.50–1.05)
GFR, Est African American: >89 mL/min (ref >=60)
GFR, Est Non African American: >89 mL/min (ref >=60)
GLUCOSE: 284 mg/dL — AB (ref 65–99)
POTASSIUM: 4 mmol/L (ref 3.5–5.3)
SODIUM: 133 mmol/L — AB (ref 135–146)
Total Bilirubin: 0.2 mg/dL (ref 0.2–1.2)
Total Protein: 6.8 g/dL (ref 6.1–8.1)

## 2016-10-26 LAB — IRON AND TIBC
%SAT: 3 % — AB (ref 11–50)
IRON: 15 ug/dL — AB (ref 45–160)
TIBC: 481 ug/dL — ABNORMAL HIGH (ref 250–450)
UIBC: 466 ug/dL — ABNORMAL HIGH (ref 125–400)

## 2016-10-26 LAB — CBC WITH DIFFERENTIAL/PLATELET
BASOS PCT: 0 %
Basophils Absolute: 0 cells/uL (ref 0–200)
Eosinophils Absolute: 87 cells/uL (ref 15–500)
Eosinophils Relative: 1 %
HEMATOCRIT: 30.8 % — AB (ref 35.0–45.0)
Hemoglobin: 8.5 g/dL — ABNORMAL LOW (ref 11.7–15.5)
LYMPHS PCT: 19 %
Lymphs Abs: 1653 cells/uL (ref 850–3900)
MCH: 17.9 pg — ABNORMAL LOW (ref 27.0–33.0)
MCHC: 27.7 g/dL — ABNORMAL LOW (ref 32.0–36.0)
MCV: 65.1 fL — ABNORMAL LOW (ref 80.0–100.0)
MONO ABS: 435 {cells}/uL (ref 200–950)
MONOS PCT: 5 %
NEUTROS ABS: 6525 {cells}/uL (ref 1500–7800)
Neutrophils Relative %: 75 %
PLATELETS: 258 10*3/uL (ref 140–400)
RBC: 4.75 MIL/uL (ref 3.80–5.10)
RDW: 20.8 % — AB (ref 11.0–15.0)
WBC: 8.7 10*3/uL (ref 3.8–10.8)

## 2016-10-26 LAB — FERRITIN: Ferritin: 18 ng/mL (ref 10–232)

## 2016-10-26 NOTE — Patient Instructions (Signed)
Gracias por venir hoy.  He ordenado que los laboratorios verifiquen tu anemia.  Te llamar con [place].  Por favor programe una colonoscopia a su conveniencia.  Sospecho que podemos abordar su anemia con suplementos de hierro.  Debemos hacer un seguimiento de su diabetes a fines de enero o principios de febrero.  Mejor, Dr. Ola Spurr   Colonoscopia en los adultos (Colonoscopy, Adult) Meredith Maynard colonoscopia es un examen que se realiza para examinar el intestino grueso. Se hace para comprobar si hay problemas, por ejemplo:  Bultos (tumores).  Crecimientos (plipos).  Hinchazn (inflamacin).  Una hemorragia. ANTES DEL [place] y bebida  Siga las indicaciones del mdico respecto de las comidas y las bebidas. Estas indicaciones pueden incluir lo siguiente:  [place]SCO del procedimiento, siga una dieta con bajo contenido de [place].  No coma frutos secos.  No coma semillas.  No coma frutas disecadas.  No coma frutas crudas.  No coma verduras.  Siga una dieta de lquidos transparentes durante 1 a 3das antes del procedimiento. No beba lquidos con colorante rojo o morado. Beba solamente lquidos transparentes, por ejemplo:  Caldos o sopas transparentes.  Caf negro o t.  Jugos transparentes.  Refrescos o bebidas deportivas transparentes.  Gelatina.  Helados de agua.  El da del procedimiento, no coma ni beba nada durante las 2horas previas al procedimiento. Preparado intestinal  Si le recetaron un preparado intestinal por va oral:  Tmelo como se lo haya indicado el mdico. A partir del da previo al procedimiento, tendr que tomar una gran cantidad de lquido. El lquido lo har defecar hasta que la materia fecal sea casi transparente o de color verdoso claro.  Si la piel o la zona anal se le irritan debido a Building services engineer, Fish farm manager lo siguiente:  Limpie la zona con toallitas que contengan productos  medicinales, por ejemplo, toallitas hmedas para adultos con aloe y vitaminaE.  Aplquese un producto en la piel que suavice la zona, como vaselina.  Si vomita mientras toma el preparado intestinal, descanse durante un mximo de 29minutos. Luego comience a tomar el preparado nuevamente. Si sigue vomitando y no puede tomar el preparado intestinal sin vomitar, llame al mdico. Instrucciones generales   Consulte al mdico si debe cambiar o suspender sus medicamentos habituales. Esto es importante si toma medicamentos para la diabetes o anticoagulantes.  Haga planes para que una persona lo lleve a su casa desde el hospital o la clnica. PROCEDIMIENTO  Pueden colocarle una va intravenosa (IV) en una de las venas.  Recibir un medicamento como ayuda para relajarse (sedante).  Para reducir el riesgo de infecciones:  Los mdicos se lavarn las manos.  Le lavarn la zona anal con jabn.  Le pedirn que se recueste de costado con las rodillas flexionadas.  El mdico tendr listo un tubo [place], delgado y flexible. El tubo tendr Carlota Raspberry y Hali Marry en el extremo.  Le colocarn el tubo en el ano.  El tubo se introducir suavemente en el intestino grueso.  Le pondrn aire en el intestino grueso para mantenerlo abierto. Es posible que sienta presin o clicos.  La cmara se usar para Artist.  Pueden extraerle Radford Pax de tejido del cuerpo para estudiarla con un microscopio (biopsia). Si se detecta la presencia de posibles problemas, se enviar el tejido a un laboratorio para ser [employer].  Si se encuentran pequeos crecimientos, el mdico puede extirparlos y analizarlos para Product manager presencia de cncer.  Lentamente se retirar Landscape architect en el ano. Este procedimiento puede variar segn el mdico y el hospital. DESPUS DEL PROCEDIMIENTO  El mdico lo controlar con frecuencia hasta que hayan desaparecido los efectos de los medicamentos  administrados.  No conduzca durante 24horas despus del procedimiento.  Es posible que encuentre una pequea cantidad de sangre en la materia fecal.  Puede eliminar gases.  Puede sentir clicos o meteorismo leves en el abdomen.  Depende de usted [employer]  del procedimiento. Pregntele al mdico o consulte en el departamento que realiza el procedimiento cundo [employer]. Esta informacin no tiene Marine scientist el consejo del mdico. Asegrese de hacerle al mdico cualquier pregunta que tenga. Document Released: 02/01/2011 Document Revised: 10/22/2013 Document Reviewed: 12/29/2015 Elsevier Interactive Patient Education  2017 [place].  Anemia inespecfica (Anemia, Nonspecific) La anemia es una enfermedad en la que la concentracin de glbulos rojos o el nivel de hemoglobina en la sangre estn por debajo de lo normal. La hemoglobina es la sustancia de los glbulos rojos que lleva el oxgeno a todo el cuerpo. La anemia da como resultado que los tejidos no reciban la cantidad suficiente de oxgeno. CAUSAS Las causas ms frecuentes de anemia son:  Elvina Mattes. El sangrado puede ser interno o externo. Incluye sangrado excesivo debido al perodo (en las mujeres) o por los intestinos.  Dficit nutricional.  Enfermedad renal, tiroidea o heptica crnicas.  Enfermedades de la mdula sea que disminuyen la produccin de glbulos rojos.  Cncer y tratamientos para Science writer.  VIH, sida y sus tratamientos.  Trastornos del bazo que aumentan la destruccin de glbulos rojos.  Enfermedades de [employer].  Destruccin excesiva de glbulos rojos debido a una infeccin, a medicamentos y a Nurse, mental health. SIGNOS Y SNTOMAS  Debilidad leve.  Mareos.  Dolor de [place].  Palpitaciones.  Falta de aire, especialmente con el ejercicio.  Palidez.  Sensibilidad al fro.  Indigestin.  Nuseas.  Dificultad para dormir.  Dificultad para  concentrarse. Los sntomas pueden ocurrir repentinamente o pueden Psychologist, forensic. DIAGNSTICO Con frecuencia es necesario realizar anlisis de [employer]. Estos ayudan al profesional a Adult nurse. Su mdico controlar la materia fecal para Hydrographic surveyor la presencia de [place] y buscar otras causas de prdida de [place]. TRATAMIENTO El tratamiento vara segn la causa de la anemia. Las opciones de tratamiento son:  Suplementos de hierro, vitamina [id number], o cido flico.  Medicamentos con hormonas.  Transfusin de [place]. Ser necesaria en los casos de prdida de [place] grave.  Hospitalizacin. Ser necesaria si la prdida de sangre es continua y significativa.  Cambios en la dieta.  Extirpacin del bazo. INSTRUCCIONES PARA EL CUIDADO EN EL HOGAR Cumpla con todas las visitas de control. Generalmente demora varias semanas corregir la anemia, y es muy importante que el mdico controle su enfermedad y su respuesta al [place]. SOLICITE ATENCIN MDICA DE INMEDIATO SI:  Siente debilidad extrema, falta de aire o dolor en el pecho.  Se siente mareado o tiene dificultad para concentrarse.  Tiene una hemorragia vaginal abundante.  Aparece una erupcin cutnea.  La materia fecal es negra, de aspecto alquitranado.  Se desmaya.  Vomita sangre.  Vomita repetidas veces.  Siente dolor abdominal.  Tiene fiebre o sntomas persistentes durante ms de 2 - 3 das.  Tiene fiebre y los sntomas empeoran repentinamente.  Se deshidrata. ASEGRESE DE QUE:  Comprende estas instrucciones.  Controlar su afeccin.  Recibir ayuda de inmediato si no mejora o si empeora. Esta  informacin no tiene Marine scientist el consejo del mdico. Asegrese de hacerle al mdico cualquier pregunta que tenga. Document Released: 10/17/2005 Document Revised: 06/19/2013 Document Reviewed: 04/12/2013 Elsevier Interactive Patient Education  2017 [employer].

## 2016-10-26 NOTE — Progress Notes (Signed)
[facility] Progress Note  Subjective:  Meredith Maynard is a 50 y.o. female who presents for follow-up to discuss lab results. Visit assisted by Spanish phone interpreter Elita Quick [phone]).   Anemia: - This was noted after patient had surgery earlier this year but persisted with recent lab testing - Has taken iron supplementation in the past - Denies any gross blood with BMs. She denies heavy or irregular periods.  - She has not been feeling extra tired or dizzy but reports for the past 2 weeks she has had more fatigue after climbing stairs.  - She does have cramping in her legs and fingers ROS: No falls, no pica   Past Medical History:  Diagnosis Date  . Anxiety   . Fatty liver   . Gastritis   . GERD (gastroesophageal reflux disease)   . HLD (hyperlipidemia)   . Hypertension   . Meniere syndrome   . Panic attacks   . Somatic complaints, multiple   . T2DM (type 2 diabetes mellitus) ([place])    Social: Never smoker  Objective: Blood pressure 116/72, pulse 89, temperature 98.3 F (36.8 C), temperature source Oral, height 5\' 5"  (1.651 m), weight 233 lb (105.7 kg), last menstrual period 10/25/2016, SpO2 99 %. Body mass index is 38.77 kg/m. Constitutional: Obese female, in NAD Eyes: Pink conjunctiva Cardiovascular: RRR, S1, S2, no m/r/g.  Pulmonary/Chest: Effort normal and breath sounds normal. No respiratory distress.  Abdominal: Soft. +BS, NT, ND, no rebound or guarding.  GU: Declined rectal exam, stating she was on her period Psychiatric: Somewhat anxious affect.  Vitals reviewed  Labs: Hgb 8.2 on 12/15 with MCV of 64 and elliptocytes noted Hgb 9.0 on 01/08/16 Hgb 10.6 on 01/06/16 with MCV of 74.1  Assessment/Plan: Anemia - Suspect iron deficiency anemia due to low MCV - Will obtain repeat CBC, folate, iron studies and CMP - Gave patient stool cards to take home, as she declined rectal exam in office - Advised patient to schedule colonoscopy; no  clear source of blood loss (no heavy periods, no melena) but does have history of SBO in 2007 and H pylori infection, which could affect absorption, though hgb was 12.8 earlier this year - Was treated for H. pylori earlier this year, so will need to monitor response to oral iron supplementation closely as absorption may still be impaired - Anticipate restarting iron supplementation pending blood test results. Will call patient with plan.   Follow-up in about 1 month to discuss diabetes.  Olene Floss, MD [place], PGY-2

## 2016-10-27 DIAGNOSIS — D649 Anemia, unspecified: Secondary | ICD-10-CM | POA: Insufficient documentation

## 2016-10-27 NOTE — Assessment & Plan Note (Addendum)
-   Suspect iron deficiency anemia due to low MCV - Will obtain repeat CBC, folate, iron studies and CMP - Gave patient stool cards to take home, as she declined rectal exam in office - Advised patient to schedule colonoscopy; no clear source of blood loss (no heavy periods, no melena) but does have history of SBO in 2007 and H pylori infection, which could affect absorption, though hgb was 12.8 earlier this year - Was treated for H. pylori earlier this year, so will need to monitor response to oral iron supplementation closely as absorption may still be impaired - Anticipate restarting iron supplementation pending blood test results. Will call patient with plan.

## 2016-10-28 ENCOUNTER — Telehealth: Payer: Self-pay | Admitting: Internal Medicine

## 2016-10-28 DIAGNOSIS — D509 Iron deficiency anemia, unspecified: Secondary | ICD-10-CM

## 2016-10-28 MED ORDER — FERROUS SULFATE 325 (65 FE) MG PO TBEC: 325.0000 mg | DELAYED_RELEASE_TABLET | Freq: Two times a day (BID) | ORAL | 3 refills | Status: DC

## 2016-10-28 NOTE — Telephone Encounter (Signed)
Called patient to inform her that her anemia does appear to be due to iron deficiency. Recommend taking ferrous sulfate 325 mg twice daily for at least a couple of months. Counseled not to take directly with antacids and calcium. Plan to follow up hgb at next visit. Phone Spanish interpreter assisted phone call.

## 2017-02-01 ENCOUNTER — Other Ambulatory Visit: Payer: Self-pay | Admitting: *Deleted

## 2017-02-01 DIAGNOSIS — E1169 Type 2 diabetes mellitus with other specified complication: Secondary | ICD-10-CM

## 2017-02-01 DIAGNOSIS — E669 Obesity, unspecified: Principal | ICD-10-CM

## 2017-02-01 MED ORDER — LISINOPRIL 20 MG PO TABS: 20.0000 mg | ORAL_TABLET | Freq: Every day | ORAL | 11 refills | Status: DC

## 2017-02-01 MED ORDER — METFORMIN HCL 1000 MG PO TABS: 1000.0000 mg | ORAL_TABLET | Freq: Two times a day (BID) | ORAL | 11 refills | Status: DC

## 2017-04-26 ENCOUNTER — Encounter: Payer: Self-pay | Admitting: Family Medicine

## 2017-04-26 ENCOUNTER — Ambulatory Visit (INDEPENDENT_AMBULATORY_CARE_PROVIDER_SITE_OTHER): Payer: Self-pay | Admitting: Family Medicine

## 2017-04-26 VITALS — BP 128/64 | HR 87 | Temp 98.8°F | Ht 65.0 in | Wt 245.0 lb

## 2017-04-26 DIAGNOSIS — G44209 Tension-type headache, unspecified, not intractable: Secondary | ICD-10-CM

## 2017-04-26 DIAGNOSIS — D509 Iron deficiency anemia, unspecified: Secondary | ICD-10-CM

## 2017-04-26 DIAGNOSIS — R42 Dizziness and giddiness: Secondary | ICD-10-CM

## 2017-04-26 NOTE — Patient Instructions (Signed)
Please try Ibuprofen 400-600 mg when you get a headache. If it does not subside, you can take Excedrin Migraine which may also help.  Please see your PCP as scheduled 7/13 at 2:30 pm  Cefalea tensional (Tension Headache) Una cefalea tensional es un dolor o presin que se siente en la frente y los lados de la cabeza. Estas cefaleas pueden durar de 58minutos a varios das. CUIDADOS EN EL HOGAR Control del [employer] de venta libre y los recetados solamente como se lo haya indicado el mdico.  Cuando sienta dolor de cabeza acustese en un cuarto oscuro y tranquilo.  Si se lo indican, aplquese hielo en la zona de la cabeza y el cuello: ? Ponga el hielo en una bolsa plstica. ? Coloque una [employer] piel y la bolsa de hielo. ? Coloque el hielo durante 23minutos, 2 a 3veces por da.  Utilice una almohadilla trmica o tome una ducha caliente para aplicar calor en la zona de la cabeza y el cuello segn las indicaciones del mdico. Comida y bebida  Mantenga un horario para las comidas.  No beba mucho alcohol.  No consuma mucha cafena ni deje de consumirla por completo. Instrucciones generales  Concurra a todas las visitas de control como se lo haya indicado el mdico. Esto es importante.  Lleve un registro diario para Neurosurgeon si ciertas cosas provocan los dolores de [place]. Por ejemplo, escriba los siguientes datos: ? Lo que usted come y bebe. ? Cunto tiempo duerme. ? Algn cambio en su dieta o en los medicamentos.  Pruebe recibir Engineer, production o hacer otras cosas que lo ayuden a Nurse, children's.  Disminuya el nivel de estrs.  Sintese con la espalda recta. No contraiga (tensione) los msculos.  No consuma productos que contengan tabaco. Estos incluyen cigarrillos, tabaco para mascar y Psychologist, sport and exercise. Si necesita ayuda para dejar de fumar, consulte al mdico.  Haga ejercicios con regularidad tal como se lo indic el mdico.  Duerma lo suficiente.  Es Software engineer, entre 7 y 9horas de sueo. SOLICITE AYUDA SI:  Los medicamentos no logran [employer].  Tiene un dolor de cabeza que es diferente del dolor de cabeza habitual.  Tiene Tree surgeon estomacal (nuseas) o vomita.  Tiene fiebre. SOLICITE AYUDA DE INMEDIATO SI:  La cefalea es muy intensa.  Sigue vomitando.  Presenta rigidez en el cuello.  Tiene dificultad para ver.  Tiene dificultad para hablar.  Siente dolor en el ojo o en el odo.  Sus msculos estn dbiles, o pierde el control muscular.  Pierde el equilibrio o tiene problemas para Writer.  Siente que se desvanece (pierde el conocimiento) o se desmaya.  Se siente confundido. Esta informacin no tiene Marine scientist el consejo del mdico. Asegrese de hacerle al mdico cualquier pregunta que tenga. Document Released: 01/09/2012 Document Revised: 07/08/2015 Document Reviewed: 02/09/2015 Elsevier Interactive Patient Education  [employer].

## 2017-04-26 NOTE — Assessment & Plan Note (Signed)
  Unclear etiology, no alarm symptoms for this, no syncope.   -advised patient to stay hydrated -follow up with PCP

## 2017-04-26 NOTE — Progress Notes (Signed)
    Subjective:    Patient ID: Meredith Maynard, female    DOB: [birth year], 51 y.o.   MRN: [id number]   CC: headache  Headache- started last Wednesday and has happened 3-4 times, usually while at work. Headache is in back of head, is aching 7/10 pain. She has tried aspirin with no improvement. It will resolve on its own in 4-5 hours. Noise and light do not make it worse. Activity does not make it worse. She has poor sleep but does not think this is related. She drinks coffee every morning. The headache happens at 10 am or at night she has noted. No triggers she can identify. She denies any vision changes, numbness weakness anywhere. Endorses her head feels "tingly". No nausea or vomiting, no fevers, no stiff neck. No head trauma. Thinks it is related to how hot it is at work and there is no AC.   Dizziness- reports this happens every day. This appears to be a chronic issue for her. She has checked BP when it happens and it has not been low. No syncope or near syncope. She has had vertigo in the past but it is not similar.   Concern about iron def anemia- Is taking iron tablets, but not every day. Started taking this 8 days ago.  Period very heavy but not abnormally so for her. No fatigue, SOB, palpitations  Smoking status reviewed- non-smoker  Review of Systems- see HPI   Objective:  BP 128/64   Pulse 87   Temp 98.8 F (37.1 C) (Oral)   Ht 5\' 5"  (1.651 m)   Wt 245 lb (111.1 kg)   SpO2 98%   BMI 40.77 kg/m  Vitals and nursing note reviewed  General: well nourished, in no acute distress HEENT: normocephalic, TM's visualized bilaterally, no scleral icterus or conjunctival pallor, no nasal discharge, moist mucous membranes, good dentition without erythema or discharge noted in posterior oropharynx Neck: supple, non-tender, without lymphadenopathy Cardiac: RRR, clear S1 and S2, no murmurs, rubs, or gallops Respiratory: clear to auscultation bilaterally, no increased work of  breathing Abdomen: soft, nontender, nondistended, no masses or organomegaly. Bowel sounds present Extremities: no edema or cyanosis. Warm, well perfused. 2+ radial and PT pulses bilaterally Skin: warm and dry, no rashes noted Neuro: alert and oriented, no focal deficits  Assessment & Plan:    Tension headache  Presentation consistent with tension headache. No alarm symptoms present. Neuro exam normal. BP normal.   -advised patient try ibuprofen or excedrin migraine for pain relief -stay hydrated -avoid getting overheated as this seems to be a trigger -follow up with PCP on 7/13 and if headaches not improved with OTC pain medications can discuss further.  DIZZINESS, CHRONIC  Unclear etiology, no alarm symptoms for this, no syncope.   -advised patient to stay hydrated -follow up with PCP   ANEMIA, IRON DEFICIENCY, UNSPEC.  Patient concerned she is anemic. She was told to start Iron supplementation in December but just started taking this 8 days ago  -encouraged her to continue iron -follow up with PCP    Return in about 4 weeks (around 05/24/2017).   Lucila Maine, DO Family Medicine Resident PGY-1

## 2017-04-26 NOTE — Assessment & Plan Note (Signed)
  Patient concerned she is anemic. She was told to start Iron supplementation in December but just started taking this 8 days ago  -encouraged her to continue iron -follow up with PCP

## 2017-04-26 NOTE — Assessment & Plan Note (Signed)
  Presentation consistent with tension headache. No alarm symptoms present. Neuro exam normal. BP normal.   -advised patient try ibuprofen or excedrin migraine for pain relief -stay hydrated -avoid getting overheated as this seems to be a trigger -follow up with PCP on 7/13 and if headaches not improved with OTC pain medications can discuss further.

## 2017-05-12 ENCOUNTER — Ambulatory Visit (INDEPENDENT_AMBULATORY_CARE_PROVIDER_SITE_OTHER): Payer: Self-pay | Admitting: Internal Medicine

## 2017-05-12 DIAGNOSIS — R42 Dizziness and giddiness: Secondary | ICD-10-CM

## 2017-05-12 DIAGNOSIS — E669 Obesity, unspecified: Secondary | ICD-10-CM

## 2017-05-12 DIAGNOSIS — E1169 Type 2 diabetes mellitus with other specified complication: Secondary | ICD-10-CM

## 2017-05-12 LAB — POCT GLYCOSYLATED HEMOGLOBIN (HGB A1C): HEMOGLOBIN A1C: 11.2

## 2017-05-12 MED ORDER — EMPAGLIFLOZIN 10 MG PO TABS: 10.0000 mg | ORAL_TABLET | Freq: Every day | ORAL | 3 refills | Status: DC

## 2017-05-12 NOTE — Progress Notes (Signed)
[facility] Progress Note  Subjective:  Meredith Maynard is a 51 y.o. female with history of T2DM, anxiety, and chronic dizziness who presents to follow-up dizziness. Spanish video interpreter assisted visit Sunday Spillers [phone]).   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  - No falls - Just recently started taking previously prescribed iron supplementation; has history of heavy periods but denies other bleeding  ROS: No change in vision, no n/v, no associated chest pain  T2DM: - Patient reports taking metformin 1000 mg BID. Says she takes an old prescription (not on active medication list) of glipizide 5 mg from time to time but feels like her sugars get too low on this. - Checks her CBGs about every 3 days; reports lowest CBG of 175 and highest of 400 after eating watermelon - Wary of insulin because she has a work friend whose sugar always gets too low on insulin; does not think it makes sense to take a medication for sugars only to have to take candy if overcorrected - Says she does not understand why her blood sugar is low because she has a "good diet" and does not eat much; reports having oatmeal with cinnamon at breakfast or toast with peanut butter and only has time to eat tortillas at lunch - Does not think she urinates a lot but gets up about twice a night to urinate ROS: Denies current abdominal pain, no dysuria  Social: Never smoker  Objective: Constitutional: Obese female, in NAD HENT: MMM.  Cardiovascular: RRR, S1, S2, no m/r/g.  Pulmonary/Chest: Effort normal and breath sounds normal. No respiratory distress.  Abdominal: Soft. +BS, NT, ND Musculoskeletal: No LE edema Neurological:  AOx3, no focal deficits. Decreased sensation over plantar surface of toes; no wounds.  Vitals reviewed  Assessment/Plan: Diabetes mellitus type 2 in obese - A1c 11.2 today, compared to 9.0 eight months ago - Patient with poor understanding of her disease. She is interested in meeting with a dietician. Recommended patient see Dr. Jenne Campus in resident nutrition clinic (more affordable). Counseled that her diet is high in carbohydrates and encouraged her to have more protein.  - Asked patient to begin checking her blood sugars 1-2 times a day and to keep a log. Let her know I think she would benefit from more frequent appointments with me to discuss diabetes.  - Not interested in starting insulin. Offered to make appointment with pharmacy team to review all her options. She asked if there was another pill she could take; does not want to take glipizide, as she feels it drops her sugars too drastically. - Ordered jardiance 10 mg for patient to start.  - Patient interested in making dietary changes and receiving further education. I am concerned she will ultimately need insulin, as high A1c may indicate pancreas not able to keep up with patient's needs - Would also benefit from starting moderate intensity statin but will address at next visit, as did not feel could adequately counsel on starting another new medication within appointment's time constraints.  - Needs Ophthalmology appointment   DIZZINESS, CHRONIC - Stable. I suspect poorly controlled diabetes is causing some level of dehydration.   Follow-up in about 3 weeks to review CBGs.  Olene Floss, MD [place], PGY-3

## 2017-05-12 NOTE — Patient Instructions (Signed)
Please schedule Meredith Maynard with Dr. Jenne Campus [facility] with a resident (tends to be more affordable).   Continuar metformina [employer]. Detener glipizide Comience jardiance 10 mg.  Por favor, revise su nivel de azcar en la sangre 1-2 veces al da.  Me gustara volver a verte en 3-4 semanas.  Mejor, Dr. Ola Spurr  Continue metformin twice daily. Stop glipizide. Start jardiance 10 mg.  Please check your blood sugars 1-2 times daily.  I would like to see you back in 3-4 weeks.  Best, Dr. Ola Spurr

## 2017-05-14 NOTE — Assessment & Plan Note (Addendum)
-   A1c 11.2 today, compared to 9.0 eight months ago - Patient with poor understanding of her disease. She is interested in meeting with a dietician. Recommended patient see Dr. Jenne Campus in resident nutrition clinic (more affordable). Counseled that her diet is high in carbohydrates and encouraged her to have more protein.  - Asked patient to begin checking her blood sugars 1-2 times a day and to keep a log. Let her know I think she would benefit from more frequent appointments with me to discuss diabetes.  - Not interested in starting insulin. Offered to make appointment with pharmacy team to review all her options. She asked if there was another pill she could take; does not want to take glipizide, as she feels it drops her sugars too drastically. - Ordered jardiance 10 mg for patient to start.  - Patient interested in making dietary changes and receiving further education. I am concerned she will ultimately need insulin, as high A1c may indicate pancreas not able to keep up with patient's needs - Would also benefit from starting moderate intensity statin but will address at next visit, as did not feel could adequately counsel on starting another new medication within appointment's time constraints.  - Needs Ophthalmology appointment

## 2017-05-14 NOTE — Assessment & Plan Note (Signed)
-   Stable. I suspect poorly controlled diabetes is causing some level of dehydration.

## 2017-05-23 ENCOUNTER — Encounter: Payer: Self-pay | Admitting: Internal Medicine

## 2017-05-23 ENCOUNTER — Ambulatory Visit (INDEPENDENT_AMBULATORY_CARE_PROVIDER_SITE_OTHER): Payer: Self-pay | Admitting: Internal Medicine

## 2017-05-23 VITALS — BP 102/70 | HR 76 | Temp 98.5°F | Wt 244.0 lb

## 2017-05-23 DIAGNOSIS — N920 Excessive and frequent menstruation with regular cycle: Secondary | ICD-10-CM

## 2017-05-23 LAB — POCT HEMOGLOBIN: HEMOGLOBIN: 7.6 g/dL — AB (ref 12.2–16.2)

## 2017-05-23 NOTE — Patient Instructions (Addendum)
Please increase you iron to 3 times a day at morning lunch and dinner. Your hemoglobin is on the low side I want to follow-up in 2-3 days to check on on your bleeding.  Your diabetes medication includes Jardiance

## 2017-05-23 NOTE — Progress Notes (Signed)
   [facility] Kerrin Mo, MD Phone: [phone]  Reason For Visit: Menorrhagia, Regular Menstrual periods   Patient is here for vaginal bleeding. Patient has not gone through menopause yet. Regulars periods, has increased bleeding this month associated with her period. Patient states on Saturday, had several blood clots noted while taking a shower. Patient indicates she changed pads every 30 minutes for about 2- 3 hours. Patient indicates that the bleeding has decreased, thought not normal. Patient feels weak, and dizzy, indicates feeling a bit more  sleepy. Patient states she had increased bleeding similar to Saturday on Monday. Patient denies taking any soy containing or herbal supplements. No family history of breast, colon, and endometrial cancer.   Past Medical History Reviewed problem list.  Medications- reviewed and updated No additions to family history Social history- patient is a non-smoker  Objective: BP 102/70   Pulse 76   Temp 98.5 F (36.9 C) (Oral)   Wt 244 lb (110.7 kg)   BMI 40.60 kg/m  Gen: NAD, alert, cooperative with exam Cardio: regular rate and rhythm, S1S2 heard, no murmurs appreciated Pulm: clear to auscultation bilaterally, no wheezes, rhonchi or rales Skin: dry, intact, no rashes or lesions  Assessment/Plan: See problem based a/p  Menorrhagia with regular cycle Likely this is part of patient's premenopausal symptoms which should resolve the next couple months as patient transitions into menopause. - Hemoglobin was noted to be 7.6 previous hemoglobin was 8.5; concerned that patient is having significantly more bleeding and therefore increase iron to 3 times a day. - POCT hemoglobin - TSH - CBC -- Discussed with patient if she continues to have symptoms and were her bleeding does not stop in the next couple of days to return to the clinic for further workup.

## 2017-05-24 LAB — CBC
HEMATOCRIT: 34.2 % (ref 34.0–46.6)
HEMOGLOBIN: 10.1 g/dL — AB (ref 11.1–15.9)
MCH: 22.1 pg — ABNORMAL LOW (ref 26.6–33.0)
MCHC: 29.5 g/dL — AB (ref 31.5–35.7)
MCV: 75 fL — AB (ref 79–97)
Platelets: 185 10*3/uL (ref 150–379)
RBC: 4.58 x10E6/uL (ref 3.77–5.28)
RDW: 18.6 % — ABNORMAL HIGH (ref 12.3–15.4)
WBC: 6.9 10*3/uL (ref 3.4–10.8)

## 2017-05-24 LAB — TSH: TSH: 0.385 u[IU]/mL — AB (ref 0.450–4.500)

## 2017-05-25 DIAGNOSIS — N920 Excessive and frequent menstruation with regular cycle: Secondary | ICD-10-CM

## 2017-05-25 HISTORY — DX: Excessive and frequent menstruation with regular cycle: N92.0

## 2017-05-25 NOTE — Assessment & Plan Note (Addendum)
Likely this is part of patient's premenopausal symptoms which should resolve the next couple months as patient transitions into menopause. - Hemoglobin was noted to be 7.6 previous hemoglobin was 8.5; concerned that patient is having significantly more bleeding and therefore increase iron to 3 times a day. - POCT hemoglobin - TSH - CBC -- Discussed with patient if she continues to have symptoms and were her bleeding does not stop in the next couple of days to return to the clinic for further workup. - Discussed with Dr. Gwendlyn Deutscher

## 2017-06-01 ENCOUNTER — Telehealth: Payer: Self-pay | Admitting: Internal Medicine

## 2017-06-01 NOTE — Telephone Encounter (Signed)
Called, no answer, no vm. Patient has appointment already scheduled with PCP for 8/6.

## 2017-06-01 NOTE — Telephone Encounter (Signed)
Please call patient and let her know that her thyroid is function was slightly off. She will need to make an appointment with her PCP to have further thyriod testing. Thanks Aryon Nham

## 2017-06-05 ENCOUNTER — Encounter: Payer: Self-pay | Admitting: Internal Medicine

## 2017-06-05 ENCOUNTER — Ambulatory Visit (INDEPENDENT_AMBULATORY_CARE_PROVIDER_SITE_OTHER): Payer: Self-pay | Admitting: Internal Medicine

## 2017-06-05 VITALS — BP 110/70 | HR 96 | Temp 98.3°F | Ht 65.0 in | Wt 247.0 lb

## 2017-06-05 DIAGNOSIS — E1169 Type 2 diabetes mellitus with other specified complication: Secondary | ICD-10-CM

## 2017-06-05 DIAGNOSIS — E669 Obesity, unspecified: Secondary | ICD-10-CM

## 2017-06-05 DIAGNOSIS — D500 Iron deficiency anemia secondary to blood loss (chronic): Secondary | ICD-10-CM

## 2017-06-05 NOTE — Progress Notes (Signed)
[facility] Progress Note  Subjective:  Meredith Maynard is a 51 y.o. female with history of anemia and T2DM who presents to follow-up low hgb result. Visit assisted by Spanish video interpreter Rica Mote [phone]).   #Anemia: - Seen last month for heavy menstrual cycle. She had POCT hgb with result of 7.6. CBC, however, resulted with hgb of 10.1, which is near her baseline (though has been as high as 12.8) - Patient said bleeding stopped shortly after visit and that she feels better with improved energy levels - She had started taking iron TID as instructed at last [place] this was her first period like this, though she has reported heavier menstrual bleeding to me in past visits.  ROS: No dark stools, no further vaginal bleeding, no falls.  #T2DM: - Reports cbgs around 150-170s - Has not yet picked up jardiance. Plans to pick up today or tomorrow.  Allergies  Allergen Reactions  . Latex Rash   Social: Never smoker  Objective: Blood pressure 110/70, pulse 96, temperature 98.3 F (36.8 C), temperature source Oral, height 5\' 5"  (1.651 m), weight 247 lb (112 kg), last menstrual period 05/20/2017, SpO2 97 %. Body mass index is 41.1 kg/m. Constitutional: Obese female in NAD HENT: MMM Cardiovascular: RRR, S1, S2, no m/r/g.  Abdominal: Soft. +BS, NT, ND Neurological: AOx3, no focal deficits. Psychiatric: Normal mood and affect.  Vitals reviewed  CT abdomen 2017 without enlarged uterus.   Assessment/Plan: Iron deficiency anemia due to chronic blood loss - Hgb on CBC actually stable. Patient currently asymptomatic.  - Patient not interested in hormonal therapy for lighter periods - Continue to monitor. Would order transvaginal US to check thickness of endometrial stripe if menorrhagia were to continue.  - Resume BID dosing of oral iron supplementation  Diabetes mellitus type 2 in obese - Patient has not yet started jardiance but has been taking  metformin - Encouraged keeping a blood sugar log - Reviewed plate method of eating - Plans to re-enroll in [place] [employer] prior to scheduling Ophthalmology exam - Would like to meet with Dr. Jenne Campus about nutrition but can only meet after 3 PM. Could not meet with me next week for extended visit in [facility] clinic because child has an appointment. Will call pt's daughter Lenor Coffin at [phone] if Dr. Jenne Campus has any availability.   Health Maintenance: Due for mammography and colonoscopy. Needs Ophthalmology exam.   Follow-up in a few weeks to review CBG log.  Olene Floss, MD [place], PGY-3

## 2017-06-05 NOTE — Patient Instructions (Signed)
Ms. Acoff,  Please measure your blood sugars once you start the jardiance. It would be helpful to see you back in about 1 month with your blood sugar log.  I will contact our nutritionist to see if she has any appointments that would work with your schedule. We will call your daughter if there is an appointment.  Try compression stockings (probably a size medium) for swelling on the legs.  Best, Dr. Neil Crouch. [place],  Por favor, mida su nivel de azcar en la sangre una vez que comience la [place]. Sera til volver a verte en aproximadamente 1 mes con tu registro de [employer]pensing optician.  Me pondr en contacto con nuestra nutricionista para ver si tiene alguna cita que funcione con su horario. Llamaremos a su hija si hay una cita.  Pruebe con medias de compresin (probablemente un tamao mediano) para la hinchazn en las piernas.  Mejor, Dr. Ola Spurr

## 2017-06-06 NOTE — Assessment & Plan Note (Signed)
-   Patient has not yet started jardiance but has been taking metformin - Encouraged keeping a blood sugar log - Reviewed plate method of eating - Plans to re-enroll in [place] [employer] prior to scheduling Ophthalmology exam - Would like to meet with Dr. Jenne Campus about nutrition but can only meet after 3 PM. Could not meet with me next week for extended visit in [facility] clinic because child has an appointment. Will call pt's daughter Lenor Coffin at [phone] if Dr. Jenne Campus has any availability.

## 2017-06-06 NOTE — Assessment & Plan Note (Addendum)
-   Hgb on CBC actually stable. Patient currently asymptomatic.  - Patient not interested in hormonal therapy for lighter periods - Continue to monitor. Would order transvaginal US to check thickness of endometrial stripe if menorrhagia were to continue.  - Resume BID dosing of oral iron supplementation

## 2017-09-14 ENCOUNTER — Ambulatory Visit: Payer: Self-pay

## 2017-11-05 ENCOUNTER — Emergency Department (HOSPITAL_COMMUNITY): Payer: Self-pay

## 2017-11-05 ENCOUNTER — Encounter (HOSPITAL_COMMUNITY): Payer: Self-pay | Admitting: Emergency Medicine

## 2017-11-05 ENCOUNTER — Other Ambulatory Visit: Payer: Self-pay

## 2017-11-05 ENCOUNTER — Emergency Department (HOSPITAL_COMMUNITY)
Admission: EM | Admit: 2017-11-05 | Discharge: 2017-11-05 | Disposition: A | Discharge status: Home / Self Care | Payer: Self-pay | Attending: Emergency Medicine | Admitting: Emergency Medicine

## 2017-11-05 ENCOUNTER — Other Ambulatory Visit (HOSPITAL_COMMUNITY): Payer: Self-pay

## 2017-11-05 DIAGNOSIS — N3000 Acute cystitis without hematuria: Secondary | ICD-10-CM | POA: Insufficient documentation

## 2017-11-05 DIAGNOSIS — Z7982 Long term (current) use of aspirin: Secondary | ICD-10-CM | POA: Insufficient documentation

## 2017-11-05 DIAGNOSIS — E860 Dehydration: Secondary | ICD-10-CM | POA: Insufficient documentation

## 2017-11-05 DIAGNOSIS — R739 Hyperglycemia, unspecified: Secondary | ICD-10-CM

## 2017-11-05 DIAGNOSIS — E1165 Type 2 diabetes mellitus with hyperglycemia: Secondary | ICD-10-CM | POA: Insufficient documentation

## 2017-11-05 DIAGNOSIS — I10 Essential (primary) hypertension: Secondary | ICD-10-CM | POA: Insufficient documentation

## 2017-11-05 DIAGNOSIS — Z79899 Other long term (current) drug therapy: Secondary | ICD-10-CM | POA: Insufficient documentation

## 2017-11-05 DIAGNOSIS — Z91040 Latex allergy status: Secondary | ICD-10-CM | POA: Insufficient documentation

## 2017-11-05 DIAGNOSIS — Z7984 Long term (current) use of oral hypoglycemic drugs: Secondary | ICD-10-CM | POA: Insufficient documentation

## 2017-11-05 LAB — BASIC METABOLIC PANEL
Anion gap: 9 (ref 5–15)
BUN: 8 mg/dL (ref 6–20)
CALCIUM: 8.8 mg/dL — AB (ref 8.9–10.3)
CO2: 22 mmol/L (ref 22–32)
CREATININE: 0.47 mg/dL (ref 0.44–1.00)
Chloride: 101 mmol/L (ref 101–111)
GFR calc Af Amer: >60 mL/min (ref >60)
Glucose, Bld: 300 mg/dL — ABNORMAL HIGH (ref 65–99)
Potassium: 3.9 mmol/L (ref 3.5–5.1)
Sodium: 132 mmol/L — ABNORMAL LOW (ref 135–145)

## 2017-11-05 LAB — URINALYSIS, ROUTINE W REFLEX MICROSCOPIC
BILIRUBIN URINE: NEGATIVE
Glucose, UA: >=500 mg/dL — AB
Hgb urine dipstick: NEGATIVE
Ketones, ur: NEGATIVE mg/dL
LEUKOCYTES UA: NEGATIVE
Nitrite: POSITIVE — AB
Protein, ur: NEGATIVE mg/dL
Specific Gravity, Urine: 1.024 (ref 1.005–1.030)
pH: 5 (ref 5.0–8.0)

## 2017-11-05 LAB — HEPATIC FUNCTION PANEL
ALK PHOS: 107 U/L (ref 38–126)
ALT: 103 U/L — AB (ref 14–54)
AST: 83 U/L — ABNORMAL HIGH (ref 15–41)
Albumin: 3.4 g/dL — ABNORMAL LOW (ref 3.5–5.0)
BILIRUBIN TOTAL: 0.6 mg/dL (ref 0.3–1.2)
Total Protein: 6.9 g/dL (ref 6.5–8.1)

## 2017-11-05 LAB — I-STAT TROPONIN, ED: TROPONIN I, POC: 0.01 ng/mL (ref 0.00–0.08)

## 2017-11-05 LAB — I-STAT BETA HCG BLOOD, ED (MC, WL, AP ONLY): I-stat hCG, quantitative: <5 m[IU]/mL (ref <5)

## 2017-11-05 LAB — CBC
HCT: 36.1 % (ref 36.0–46.0)
Hemoglobin: 10.7 g/dL — ABNORMAL LOW (ref 12.0–15.0)
MCH: 20.8 pg — ABNORMAL LOW (ref 26.0–34.0)
MCHC: 29.6 g/dL — AB (ref 30.0–36.0)
MCV: 70.2 fL — ABNORMAL LOW (ref 78.0–100.0)
PLATELETS: 142 10*3/uL — AB (ref 150–400)
RBC: 5.14 MIL/uL — ABNORMAL HIGH (ref 3.87–5.11)
RDW: 18.4 % — AB (ref 11.5–15.5)
WBC: 6.4 10*3/uL (ref 4.0–10.5)

## 2017-11-05 LAB — CBG MONITORING, ED: GLUCOSE-CAPILLARY: 225 mg/dL — AB (ref 65–99)

## 2017-11-05 LAB — LIPASE, BLOOD: Lipase: 36 U/L (ref 11–51)

## 2017-11-05 MED ORDER — DIPHENHYDRAMINE HCL 50 MG/ML IJ SOLN
25.0000 mg | Freq: Once | INTRAMUSCULAR | Status: AC
  Administered 2017-11-05: 25 mg via INTRAVENOUS
  Filled 2017-11-05: qty 1

## 2017-11-05 MED ORDER — CEPHALEXIN 500 MG PO CAPS: 500.0000 mg | ORAL_CAPSULE | Freq: Three times a day (TID) | ORAL | 0 refills | Status: AC

## 2017-11-05 MED ORDER — DEXTROSE 5 % IV SOLN
1.0000 g | Freq: Once | INTRAVENOUS | Status: AC
  Administered 2017-11-05: 1 g via INTRAVENOUS
  Filled 2017-11-05: qty 10

## 2017-11-05 MED ORDER — METOCLOPRAMIDE HCL 5 MG/ML IJ SOLN
10.0000 mg | Freq: Once | INTRAMUSCULAR | Status: AC
  Administered 2017-11-05: 10 mg via INTRAVENOUS
  Filled 2017-11-05: qty 2

## 2017-11-05 MED ORDER — SODIUM CHLORIDE 0.9 % IV BOLUS (SEPSIS)
1000.0000 mL | Freq: Once | INTRAVENOUS | Status: AC
  Administered 2017-11-05: 1000 mL via INTRAVENOUS

## 2017-11-05 MED ORDER — RANITIDINE HCL 150 MG PO TABS: 150.0000 mg | ORAL_TABLET | Freq: Two times a day (BID) | ORAL | 0 refills | Status: DC

## 2017-11-05 NOTE — ED Notes (Signed)
Patient transported to X-ray 

## 2017-11-05 NOTE — ED Triage Notes (Signed)
Daughter Eugenia Mcalpine stated, she has had chest pain with headache, and left sider pain started 3 days ago.

## 2017-11-05 NOTE — ED Notes (Signed)
Main lab to add on lipase and HFP

## 2017-11-05 NOTE — ED Provider Notes (Signed)
[facility] EMERGENCY DEPARTMENT Provider Note   CSN: [id number] Arrival date & time: 11/05/17  0815     History   Chief Complaint Chief Complaint  Patient presents with  . Chest Pain  . Abdominal Pain    left  . Headache    HPI Meredith Maynard is a 52 y.o. female.  HPI   52 year old female with past medical history of hypertension, hyperlipidemia, possible somatization, who presents with multiple complaints.  The patient states that starting last week, she has begun to have multiple issues.  She states that her symptoms started with her feeling like she was more thirsty and had a dry mouth.  She then began urinating every 15-20 minutes.  This has persisted since then.  She has had associated intermittent headache which she describes as a dull, aching, left-sided headache.  This headache is worse when going from sitting to standing and she occasionally sees black spots when standing with dizziness.  This improves when sitting down.  She also reports that she has had intermittent, atypical, sharp chest and upper abdominal pains.  These seem to come and go randomly.  She has no associated shortness of breath with it.  They do seem to worsen with eating.  She denies any right upper quadrant pain.  She has not had any vomiting.  No fevers.  Of note, she also reports a mild cough and states that she has been around sick contacts and has had some moderate nasal congestion preceding her current symptoms.  She has not taken her blood sugars at home.  Past Medical History:  Diagnosis Date  . Anxiety   . Fatty liver   . Gastritis   . GERD (gastroesophageal reflux disease)   . HLD (hyperlipidemia)   . Hypertension   . Meniere syndrome   . Panic attacks   . Somatic complaints, multiple   . T2DM (type 2 diabetes mellitus) [facility])     Patient Active Problem List   Diagnosis Date Noted  . Menorrhagia with regular cycle 05/25/2017  . Tension headache 04/26/2017    . Anemia 10/27/2016  . Pelvic pain 09/05/2016  . BPPV (benign paroxysmal positional vertigo) 02/02/2016  . Recurrent incisional hernia with incarceration 01/22/2016  . Recurrent ventral incisional hernia with obstruction 01/04/2016  . Cough 12/17/2015  . Eye pain 02/20/2015  . Onychomycosis 02/20/2015  . Unspecified constipation 05/26/2014  . Abdominal pain, epigastric 11/28/2013  . Musculoskeletal pain 08/14/2013  . Health care maintenance 04/25/2013  . Trigger finger, acquired 04/25/2013  . Fatigue 04/24/2013  . Status post tooth extraction 11/21/2012  . Plantar fasciitis of right foot 07/31/2012  . Right leg pain 07/08/2012  . LUQ abdominal pain 04/30/2012  . Abdominal pain 04/27/2012  . Multiple joint pain 04/11/2012  . Chest pain 12/14/2011  . Shoulder mass 12/08/2011  . Irregular periods/menstrual cycles 09/28/2011  . Leg length discrepancy 09/27/2011  . Pes planus 09/27/2011  . Back pain 08/31/2011  . 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 Umbilical hernia 01/60/1093  . Sciatica of right side 04/18/2011  . GERD 04/08/2009  . Hyperlipidemia 03/20/2009  . UNSPECIFIED HEARING LOSS 12/31/2008  . Diabetes mellitus type 2 in obese ([place]) 01/17/2007  . DIZZINESS, CHRONIC 01/17/2007  . OBESITY, NOS 12/28/2006  . Iron deficiency anemia due to chronic blood loss 12/28/2006  . ANXIETY 12/28/2006  . HYPERTENSION, BENIGN SYSTEMIC 12/28/2006    Past Surgical History:  Procedure Laterality Date  . Abdominal U/S  2010   Other  than fatty liver, normal  . CESAREAN SECTION  2000,2004,2005   x3  . Exercise Treadmill Test  2010   Normal, poor exercise tolerance  . INCISIONAL HERNIA REPAIR N/A 01/04/2016   Procedure: RECURRENT INCISIONAL HERNIA;  Surgeon: Fanny Skates, MD;  Location: [facility];  Service: General;  Laterality: N/A;  . INSERTION OF MESH N/A 01/04/2016   Procedure: INSERTION OF MESH;  Surgeon: Fanny Skates, MD;  Location: [facility];  Service: General;  Laterality: N/A;  PHASIX  MESH  . LAPAROTOMY N/A 01/04/2016   Procedure: EXPLORATORY LAPAROTOMY AND LYSIS OF ADHESIONS ;  Surgeon: Fanny Skates, MD;  Location: [facility];  Service: General;  Laterality: N/A;  . SMALL INTESTINE SURGERY  2007   Dr. Hulen Skains  . TUBAL LIGATION    . UMBILICAL HERNIA REPAIR  2007   Dr. Hulen Skains    OB History    No data available       Home Medications    Prior to Admission medications   Medication Sig Start Date End Date Taking? Authorizing Provider  aspirin EC 81 MG tablet Take 1 tablet (81 mg total) by mouth daily. 05/01/13  Yes Timmothy Euler, MD  empagliflozin (JARDIANCE) 10 MG TABS tablet Take 10 mg by mouth daily. 05/12/17  Yes Rogue Bussing, MD  famotidine (PEPCID) 20 MG tablet Take 1 tablet (20 mg total) by mouth 2 (two) times daily. Patient taking differently: Take 20 mg by mouth 2 (two) times daily as needed for heartburn or indigestion.  05/26/14  Yes Timmothy Euler, MD  ferrous sulfate 325 (65 FE) MG EC tablet Take 1 tablet (325 mg total) by mouth 2 (two) times daily with a meal. 10/28/16  Yes Rogue Bussing, MD  fluticasone [facility]) 50 MCG/ACT nasal spray Place 2 sprays into both nostrils daily. Patient taking differently: Place 2 sprays into both nostrils daily as needed for allergies.  12/17/15  Yes Olam Idler, MD  ibuprofen (ADVIL,MOTRIN) 200 MG tablet Take 400 mg by mouth every 6 (six) hours as needed for moderate pain. For pain   Yes [provider]  lisinopril (PRINIVIL,ZESTRIL) 20 MG tablet Take 1 tablet (20 mg total) by mouth daily. 02/01/17  Yes Rogue Bussing, MD  meclizine (ANTIVERT) 25 MG tablet Take 1 tablet (25 mg total) by mouth 3 (three) times daily as needed for dizziness. 02/02/16  Yes Karamalegos, Devonne Doughty, DO  metFORMIN (GLUCOPHAGE) 1000 MG tablet Take 1 tablet (1,000 mg total) by mouth 2 (two) times daily with a meal. 02/01/17  Yes Rogue Bussing, MD  Multiple Vitamins-Minerals (MULTIVITAMIN WITH  MINERALS) tablet Take 1 tablet by mouth daily.   Yes [provider]  omeprazole (PRILOSEC) 20 MG capsule Take 1 capsule (20 mg total) by mouth daily. Patient taking differently: Take 20 mg by mouth daily as needed (acid reflux).  11/27/13  Yes Losq, Burnell Blanks, MD  polyethylene glycol (MIRALAX / GLYCOLAX) packet Take 17 g by mouth daily. Patient taking differently: Take 17 g by mouth daily as needed for mild constipation.  09/07/16  Yes Rogue Bussing, MD  Blood Glucose Monitoring Suppl [facility] PRESTO) w/Device KIT 1 Device by Does not apply route daily. 01/19/16   Frazier Richards, MD  cephALEXin (KEFLEX) 500 MG capsule Take 1 capsule (500 mg total) by mouth 3 (three) times daily for 5 days. 11/05/17 11/10/17  Duffy Bruce, MD  glucose blood (WAVESENSE PRESTO) test strip Use as instructed 01/19/16   Frazier Richards, MD  Lancets MISC 1 Device by Does not apply route daily. 01/19/16   Frazier Richards, MD  promethazine (PHENERGAN) 25 MG suppository Place 1 suppository (25 mg total) rectally every 8 (eight) hours as needed for nausea or vomiting. Patient not taking: Reported on 11/05/2017 02/02/16   Olin Hauser, DO  ranitidine (ZANTAC) 150 MG tablet Take 1 tablet (150 mg total) by mouth 2 (two) times daily for 5 days. 11/05/17 11/10/17  Duffy Bruce, MD    Family History Family History  Problem Relation Age of Onset  . Hypertension Brother   . Stroke Brother 40  . Heart attack Brother 29    Social History Social History   Tobacco Use  . Smoking status: Never Smoker  . Smokeless tobacco: Never Used  Substance Use Topics  . Alcohol use: No  . Drug use: No     Allergies   Latex   Review of Systems Review of Systems  Constitutional: Positive for chills and fatigue.  Respiratory: Positive for cough.   Cardiovascular: Positive for chest pain.  Gastrointestinal: Positive for abdominal pain.  Endocrine: Positive for polyuria.  Neurological: Positive for  weakness and headaches.  All other systems reviewed and are negative.    Physical Exam Updated Vital Signs BP 115/65   Pulse 72   Temp 98.1 F (36.7 C) (Oral)   Resp (!) 21   Ht _0  (1.651 m)   Wt 112 kg (247 lb)   LMP 10/05/2017   SpO2 99%   BMI 41.10 kg/m   Physical Exam  Constitutional: She is oriented to person, place, and time. She appears well-developed and well-nourished. No distress.  Obese  HENT:  Head: Normocephalic and atraumatic.  Mildly dry mucous membranes.  Serous effusions bilaterally and tympanic membranes.  No mastoid erythema or tenderness.  Eyes: Conjunctivae are normal.  Neck: Neck supple.  Cardiovascular: Normal rate, regular rhythm and normal heart sounds. Exam reveals no friction rub.  No murmur heard. Pulmonary/Chest: Effort normal and breath sounds normal. No respiratory distress. She has no wheezes. She has no rales.  Abdominal: Soft. She exhibits no distension. There is no tenderness.  No abdominal tenderness  Musculoskeletal: She exhibits no edema.  Neurological: She is alert and oriented to person, place, and time. She exhibits normal muscle tone.  Skin: Skin is warm. Capillary refill takes less than 2 seconds.  Psychiatric: She has a normal mood and affect.  Nursing note and vitals reviewed.   Neurological Exam:  Mental Status: Alert and oriented to person, place, and time. Attention and concentration normal. Speech clear. Recent memory is intact. Cranial Nerves: Visual fields grossly intact. EOMI and PERRLA. No nystagmus noted. Facial sensation intact at forehead, maxillary cheek, and chin/mandible bilaterally. No facial asymmetry or weakness. Hearing grossly normal. Uvula is midline, and palate elevates symmetrically. Normal SCM and trapezius strength. Tongue midline without fasciculations. Motor: Muscle strength 5/5 in proximal and distal UE and LE bilaterally. No pronator drift. Muscle tone normal. Reflexes: 2+ and symmetrical in all  four extremities.  Sensation: Intact to light touch in upper and lower extremities distally bilaterally.  Gait: Normal without ataxia. Coordination: Normal FTN bilaterally.    ED Treatments / Results  Labs (all labs ordered are listed, but only abnormal results are displayed) Labs Reviewed  BASIC METABOLIC PANEL - Abnormal; Notable for the following components:      Result Value   Sodium 132 (*)    Glucose, Bld 300 (*)    Calcium 8.8 (*)  All other components within normal limits  CBC - Abnormal; Notable for the following components:   RBC 5.14 (*)    Hemoglobin 10.7 (*)    MCV 70.2 (*)    MCH 20.8 (*)    MCHC 29.6 (*)    RDW 18.4 (*)    Platelets 142 (*)    All other components within normal limits  HEPATIC FUNCTION PANEL - Abnormal; Notable for the following components:   Albumin 3.4 (*)    AST 83 (*)    ALT 103 (*)    Bilirubin, Direct <0.1 (*)    All other components within normal limits  URINALYSIS, ROUTINE W REFLEX MICROSCOPIC - Abnormal; Notable for the following components:   APPearance HAZY (*)    Glucose, UA >=500 (*)    Nitrite POSITIVE (*)    Bacteria, UA FEW (*)    Squamous Epithelial / LPF 0-5 (*)    All other components within normal limits  CBG MONITORING, ED - Abnormal; Notable for the following components:   Glucose-Capillary 225 (*)    All other components within normal limits  URINE CULTURE  LIPASE, BLOOD  I-STAT TROPONIN, ED  I-STAT BETA HCG BLOOD, ED ([facility], [facility], [facility] ONLY)    EKG  EKG Interpretation  Date/Time:  Sunday November 05 2017 08:19:47 EST Ventricular Rate:  87 PR Interval:  130 QRS Duration: 86 QT Interval:  368 QTC Calculation: 442 R Axis:   42 Text Interpretation:  Normal sinus rhythm Abnormal ECG No significant change since last tracing Confirmed by Duffy Bruce [phone]) on 11/05/2017 10:46:42 AM       Radiology Dg Chest 2 View  Result Date: 11/05/2017 CLINICAL DATA:  Left chest pain for 3 days. EXAM: CHEST  2 VIEW  COMPARISON:  03/19/2009 and prior chest radiographs FINDINGS: Upper limits normal heart size noted. This is a low volume film with mild right basilar atelectasis. Peribronchial thickening again noted. There is no evidence of focal airspace disease, pulmonary edema, suspicious pulmonary nodule/mass, pleural effusion, or pneumothorax. No acute bony abnormalities are identified. IMPRESSION: Low volume film with mild right basilar atelectasis. Mild chronic peribronchial thickening. Electronically Signed   By: Margarette Canada M.D.   On: 11/05/2017 10:27   Ct Head Wo Contrast  Result Date: 11/05/2017 CLINICAL DATA:  52 year old female with left-sided headache for 3 days. EXAM: CT HEAD WITHOUT CONTRAST TECHNIQUE: Contiguous axial images were obtained from the base of the skull through the vertex without intravenous contrast. COMPARISON:  07/06/2007 MR and prior studies FINDINGS: Brain: No evidence of acute infarction, hemorrhage, hydrocephalus, extra-axial collection or mass lesion/mass effect. Mild generalized cerebral volume loss noted. Vascular: No hyperdense vessel or unexpected calcification. Skull: Normal. Negative for fracture or focal lesion. Sinuses/Orbits: No acute finding. Other: None. IMPRESSION: 1. No evidence of acute intracranial abnormality 2. Mild generalized cerebral volume loss Electronically Signed   By: Margarette Canada M.D.   On: 11/05/2017 11:47    Procedures Procedures (including critical care time)  Medications Ordered in ED Medications  sodium chloride 0.9 % bolus 1,000 mL (0 mLs Intravenous Stopped 11/05/17 1235)  metoCLOPramide (REGLAN) injection 10 mg (10 mg Intravenous Given 11/05/17 1042)  diphenhydrAMINE (BENADRYL) injection 25 mg (25 mg Intravenous Given 11/05/17 1040)  sodium chloride 0.9 % bolus 1,000 mL (1,000 mLs Intravenous New Bag/Given 11/05/17 1040)  cefTRIAXone (ROCEPHIN) 1 g in dextrose 5 % 50 mL IVPB (0 g Intravenous Stopped 11/05/17 1235)     Initial Impression / Assessment and  Plan /  ED Course  I have reviewed the triage vital signs and the nursing notes.  Pertinent labs & imaging results that were available during my care of the patient were reviewed by me and considered in my medical decision making (see chart for details).  Clinical Course as of Nov 05 1245  Nancy Fetter Nov 05, 2017  1024 Significantly above her baseline.  Normal anion gap and bicarb, without DKA. Glucose: (!) 300 [CI]  1025 Reassuring in setting of symptoms for at least several days.  Pain is not associated with any exertion, diaphoresis, shortness of breath, nausea, or other signs of angina.  Very low suspicion for ACS at this time.  No tachycardia, hypoxia, or signs of PE. Troponin i, poc: 0.01 [CI]  1026 Chronic, low MCV, likely iron deficiency Hemoglobin: (!) 10.7 [CI]    Clinical Course User Index [CI] Duffy Bruce, MD    52 year old female here with multiple complaints.  My primary suspicion is that she has possible viral URI which has contributed to hyperglycemia and subsequent polyuria, mild orthostasis, and intermittent dizziness.  Regarding her chest pain, this is very atypical and seems to be related to eating.  She may have a component of reflux.  Initial EKG and troponin is negative.  Will obtain chest x-ray.  Her lab work shows market hyperglycemia which is significantly above her baseline, consistent with her multiple complaints and symptoms.  Will give fluids and reassess.  The patient feels markedly improved after IV fluids.  She is tolerating p.o.  The patient does have positive nitrites and bacteria in her urine, consistent with possible UTI.  I suspect this is contributing to her hyperglycemia and subsequent dehydration.  Otherwise, her troponin is negative and EKG is nonischemic.  She has mild LFT elevation but has known fatty liver disease.  She has a normal bilirubin and no right upper quadrant tenderness, I do not suspect gallbladder disease.  She is tolerating p.o. in the ED  without difficulty.  Will treat her for UTI, advise close PCP follow-up for sugar recheck, and encourage fluids.  Final Clinical Impressions(s) / ED Diagnoses   Final diagnoses:  Dehydration  Hyperglycemia  Acute cystitis without hematuria    ED Discharge Orders        Ordered    cephALEXin (KEFLEX) 500 MG capsule  3 times daily     11/05/17 1247    ranitidine (ZANTAC) 150 MG tablet  2 times daily     11/05/17 1247       Duffy Bruce, MD 11/05/17 1247

## 2017-11-05 NOTE — ED Notes (Signed)
Pt a.o c.o left sided headache that started prior to the chest pain

## 2017-11-07 LAB — URINE CULTURE: Culture: >=100000 — AB

## 2017-11-08 ENCOUNTER — Telehealth: Payer: Self-pay | Admitting: Emergency Medicine

## 2017-11-08 NOTE — Telephone Encounter (Signed)
Post ED Visit - Positive Culture Follow-up  Culture report reviewed by antimicrobial stewardship pharmacist:  []  Elenor Quinones, Pharm.D. []  Heide Guile, Pharm.D., BCPS AQ-ID []  Parks Neptune, Pharm.D., BCPS []  Alycia Rossetti, Pharm.D., BCPS []  [place], Pharm.D., BCPS, AAHIVP []  Legrand Como, Pharm.D., BCPS, AAHIVP []  Salome Arnt, PharmD, BCPS []  Jalene Mullet, PharmD []  Vincenza Hews, PharmD, BCPS  Positive urine culture Treated with cephalexin, organism sensitive to the same and no further patient follow-up is required at this time.  Hazle Nordmann 11/08/2017, 3:32 PM

## 2017-11-14 ENCOUNTER — Encounter: Payer: Self-pay | Admitting: Student

## 2017-11-14 ENCOUNTER — Other Ambulatory Visit: Payer: Self-pay

## 2017-11-14 ENCOUNTER — Ambulatory Visit: Payer: Self-pay | Admitting: Student

## 2017-11-14 VITALS — BP 112/58 | HR 93 | Temp 98.1°F | Ht 65.0 in | Wt 245.0 lb

## 2017-11-14 DIAGNOSIS — E669 Obesity, unspecified: Secondary | ICD-10-CM

## 2017-11-14 DIAGNOSIS — E1169 Type 2 diabetes mellitus with other specified complication: Secondary | ICD-10-CM

## 2017-11-14 DIAGNOSIS — R300 Dysuria: Secondary | ICD-10-CM

## 2017-11-14 LAB — POCT URINALYSIS DIP (MANUAL ENTRY)
BILIRUBIN UA: NEGATIVE
BILIRUBIN UA: NEGATIVE mg/dL
Glucose, UA: 500 mg/dL — AB
Leukocytes, UA: NEGATIVE
NITRITE UA: NEGATIVE
PH UA: 5.5 (ref 5.0–8.0)
Protein Ur, POC: NEGATIVE mg/dL
RBC UA: NEGATIVE
SPEC GRAV UA: 1.015 (ref 1.010–1.025)
Urobilinogen, UA: 0.2 E.U./dL

## 2017-11-14 LAB — GLUCOSE, POCT (MANUAL RESULT ENTRY): POC GLUCOSE: 339 mg/dL — AB (ref 70–99)

## 2017-11-14 LAB — POCT GLYCOSYLATED HEMOGLOBIN (HGB A1C): HEMOGLOBIN A1C: 11.2

## 2017-11-14 MED ORDER — GLIPIZIDE 5 MG PO TABS: 5.0000 mg | ORAL_TABLET | Freq: Two times a day (BID) | ORAL | 3 refills | Status: DC

## 2017-11-14 MED ORDER — METFORMIN HCL 1000 MG PO TABS: 1000.0000 mg | ORAL_TABLET | Freq: Two times a day (BID) | ORAL | 11 refills | Status: DC

## 2017-11-14 MED ORDER — LISINOPRIL 20 MG PO TABS: 20.0000 mg | ORAL_TABLET | Freq: Every day | ORAL | 11 refills | Status: DC

## 2017-11-14 NOTE — Patient Instructions (Signed)
Diabetes mellitus y nutricin Diabetes Mellitus and Nutrition Si sufre de diabetes (diabetes mellitus), es muy importante tener hbitos alimenticios saludables debido a que sus niveles de Designer, television/film set sangre (glucosa) se ven afectados en gran medida por lo que come y bebe. [place] alimentos saludables en las cantidades [place], aproximadamente a la [employer], [place] ayudar a:  Aeronautical engineer glucemia.  Disminuir el riesgo de sufrir una enfermedad cardaca.  Mejorar la presin arterial.  Science writer o mantener un peso saludable.  Todas las personas que sufren de diabetes son diferentes y cada una tiene necesidades diferentes en cuanto a un plan de alimentacin. El mdico puede recomendarle que trabaje con un especialista en dietas y nutricin (nutricionista) para Financial trader plan para usted. Su plan de alimentacin puede variar segn factores como:  Las caloras que necesita.  Los medicamentos que toma.  Su peso.  Sus niveles de glucemia, presin arterial y colesterol.  Su nivel de [place].  Otras afecciones que tenga, como enfermedades cardacas o renales.  Cmo me afectan los carbohidratos? Los carbohidratos afectan el nivel de glucemia ms que cualquier otro tipo de alimento. La ingesta de carbohidratos naturalmente aumenta la cantidad glucosa en la sangre. El recuento de carbohidratos es un mtodo destinado a Catering manager un registro de la cantidad de carbohidratos que se ingieren. El recuento de carbohidratos es importante para Theatre manager la glucemia a un nivel saludable, en especial si utiliza insulina o toma determinados medicamentos por va oral para la diabetes. Es importante saber la cantidad de carbohidratos que se pueden ingerir en cada comida sin correr Engineer, manufacturing. Esto es Psychologist, forensic. El nutricionista puede ayudarlo a calcular la cantidad de carbohidratos que debe ingerir en cada comida y colacin. Los alimentos que contienen carbohidratos  incluyen:  Pan, cereal, arroz, pasta y galletas.  Papas y maz.  Guisantes, frijoles y lentejas.  Leche y Estate agent.  Lambert Mody y [place].  Postres, como pasteles, galletitas, helado y caramelos.  Cmo me afecta el alcohol? El alcohol puede provocar disminuciones sbitas de la glucemia (hipoglucemia), en especial si utiliza insulina o toma determinados medicamentos por va oral para la diabetes. La hipoglucemia es una afeccin potencialmente mortal. Los sntomas de la hipoglucemia (somnolencia, mareos y confusin) son similares a los sntomas de haber consumido demasiado alcohol. Si el mdico afirma que el alcohol es seguro para usted, siga estas pautas:  Limite el consumo de alcohol a no ms de 1 medida por da si es mujer y no est Music therapist, y a 2 medidas si es hombre. Una medida equivale a 12oz (324m) de cerveza, 5oz (1459m de vino o 1oz (4458mde bebidas de alta graduacin alcohlica.  No beba con el estmago vaco.  Mantngase hidratado con agua, gaseosas dietticas o t helado sin azcar.  Tenga en cuenta que las gaseosas comunes, los jugos y otros refrescos pueden contener mucha azcar y se deben contar como carbohidratos.  Consejos para seguir estPhotographers etiquetas de los alimentos  Comience por controlar el tamao de la porcin en la etiqueta. La cantidad de caloras, carbohidratos, grasas y otros nutrientes mencionados en la etiqueta se basan en una porcin del alimento. Muchos alimentos contienen ms de una porcin por envase.  Verifique la cantidad total de gramos (g) de carbohidratos totales en una porcin. Puede calcular la cantidad de porciones de carbohidratos al dividir el total de carbohidratos por 15. Por ejemplo, si un alimento posee un total de 30g de carbohidratos, equivale a 2 porciones  de carbohidratos.  Verifique la cantidad de gramos (g) de grasas saturadas y grasas trans en una porcin. Escoja alimentos que no contengan grasa o que tengan un bajo  contenido.  Controle la cantidad de miligramos (mg) de sodio en una porcin. La [employer] de las personas deben limitar la ingesta de sodio total a menos de 2321m por dTraining and development officer  Siempre consulte la informacin nutricional de los alimentos etiquetados como "con bajo contenido de grasa" o "sin grasa". Estos alimentos pueden ser ms altos en azcar agregada o en carbohidratos refinados y deben evitarse.  Hable con el nutricionista para identificar sus objetivos diarios en cuanto a los nutrientes mencionados en la etiqueta. De compras  Evite comprar alimentos procesados, enlatados o prehechos. Estos alimentos tienden a tTEFL teachercantidad de gBethel sodio y azcar agregada.  Compre en la zona exterior de la tienda de comestibles. Esta incluye frutas y vNorthrop Grumman granos a granel, carnes frescas y productos lcteos frescos. Coccin  Utilice mtodos de coccin a baja temperatura, como hornear, en lugar de mtodos de coccin a alta temperatura, como frer en abundante aceite.  Cocine con aceites saludables, como el aceite de oAlva canola o gD[place]  Evite cocinar con manteca, crema o carnes con alto contenido de grasa. Planificacin de las comidas  CInternational Papercomidas y las colaciones de forma regular, preferentemente a la misma hora todos lH[place] Evite pasar largos perodos de tiempo sin comer.  Consuma alimentos ricos en fibra, como frutas frescas, verduras, frijoles y cereales integrales. Consulte al nutricionista sobre cuntas porciones de carbohidratos puede consumir en cada comida.  Consuma entre 4 y 6 onzas de protenas magras por da, como carnes mArgyle pollo, pescado, hFirst Data Corporationo tofu. 1 onza equivale a 1 onza de carne, pollo o pescado, 1 huevo, o 1/4 taza de tofu.  Coma algunos alimentos por da que contengan grasas saludables, como aguacates, frutos secos, semillas y pescado. Estilo de vida   Controle su nivel de glucemia con regularidad.  Haga ejercicio al menos 366mutos,  5das o ms por semana, o como se lo haya indicado el mdico.  Tome los meTenneco Ince lo haya indicado el mdico.  No consuma ningn producto que contenga nicotina o tabaco, como cigarrillos y ciPsychologist, sport and exerciseSi necesita ayuda para dejar de fumar, consulte al mdHess Corporationon un asesor o instructor en diabetes para identificar estrategias para controlar el estrs y cualquier desafo emocional y social. Cules son algunas de las preguntas que puedo hacerle a mi mdico?  Es necesario que me rena con unRadio broadcast assistantn diabetes?  Es necesario que me rena con un nutricionista?  A qu nmero puedo llamar si tengo preguntas?  Cules son los mejores momentos para controlar la glucemia? Dnde encontrar ms informacin:  Asociacin Americana de la Diabetes (American Diabetes Association): diabetes.org/food-and-fitness/food  Academia de Nutricin y DiInformation systems managerAcademy of Nutrition and Dietetics): wwPokerClues.dkInNew Riegeliabetes y laWatertown Town ReWater quality scientistNMedstar Saint Mary'S Hospitalf Diabetes and Digestive and Kidney Diseases) (InGulkanaNIH): wwContactWire.beesumen  Un plan de alimentacin saludable lo ayudar a coAeronautical engineerlucemia y maTheatre managern estilo de vida saludable.  Trabajar con un especialista en dietas y nutricin (nutricionista) puede ayudarlo a elInsurance claims handlere alimentacin para usted.  Tenga en cuenta que los carbohidratos y el alcohol tienen efectos inmediatos en sus niveles de glucemia. Es importante contar los carbohidratos y consumir alcohol con prudencia. Esta informacin no tiene coMarine scientistl consejo del  mdico. Asegrese de hacerle al mdico cualquier pregunta que tenga. Document Released: 01/24/2008 Document Revised: 02/06/2017 Document Reviewed:  02/06/2017 Elsevier Interactive Patient Education  2018 [place] (Hypoglycemia) La hipoglucemia se produce cuando el nivel de azcar (glucosa) en la sangre es demasiado bajo. Los sntomas de la glucemia baja pueden incluir los siguientes:  Sentir que tiene lo siguiente: ? Apetito. ? Preocupacin o nervios (ansioso). ? Sudoracin y [employer]. ? Confusin. ? Mareos. ? Somnolencia. ? Nuseas.  Tener lo siguiente: ? Latidos cardacos acelerados. ? Dolor de [place]. ? Cambios en la visin. ? Una crisis de movimientos que no puede controlar (convulsiones). ? Pesadillas. ? Hormigueo y falta de sensibilidad (adormecimiento) alrededor de la boca, los labios o la [place].  Dificultades para hacer lo siguiente: ? Hablar. ? Prestar atencin (concentrarse). ? Moverse (coordinacin). ? Dormir.  Temblores.  Desmayos.  Molestarse con facilidad (irritabilidad). Las personas que tienen diabetes y las que no tienen la enfermedad pueden tener la glucemia baja. El nivel bajo de glucemia en la sangre puede ocurrir rpidamente y ser Engineer, maintenance (IT). Tratamiento de la glucemia baja Generalmente, el tratamiento de la glucemia baja consiste en ingerir de inmediato un alimento o una bebida que contengan azcar. Si puede pensar con claridad y tragar de manera segura, siga la regla 15/15, que consiste en lo siguiente:  Consumir 15gramos de un hidrato de carbono de accin rpida. Algunos hidratos de carbono de accin rpida son los siguientes: ? 1pomo de glucosa en gel. ? 3comprimidos de azcar (comprimidos de glucosa). ? 6 a 8unidades de caramelos duros. ? 4onzas (177m) de jugo de frutas. ? 4onzas (1298m de gaseosa comn (no diettica).  Contrlese la glucemia 1534mtos despus de ingerir el hidrato de carbono.  Si el nivel de glucosa en la sangre todava es igual o menor que '70mg'$ /dl (3,9mm45ml), ingiera nuevamente 15gramos de un hidrato de carbono.  Si el  nivel de glucosa en la sangre no supera los '70mg'$ /dl (3,9mmo54m) despus de 3intentos, solicite ayuda de inmediato.  Ingiera una comida o una colacin en el transcurso de 1hora despus de que la glucemia se haya normalizado. Tratamiento de la glucemia muy baja Si el nivel de glucosa en la sangre es igual o menor que '54mg'$ /dl (3mmol24m, significa que est muy bajo (hipoglucemia grave). Esto es una emEngineer, maintenance (IT)spere hasta que los sntomas desaparezcan. Solicite atencin mdica de inmediato. Comunquese con el servicio de emergencias de su localidad (911 en los [place]). No conduzca por sus propios medios hasta Principal Financialu nivel de glucosa en la sangre muy bajo y no puede ingerir ningn alimento ni bebida, tal vez deba aplicarse una inyeccin de glucagn. Un familiar o un amigo deben aprender a controlarle la glucemia y a aplicarle una inyeccin de glucagn. Pregntele al mdico si debe tener un kit de inyecciones de glucagn en su casa. CUIDADOS EN EL HOGAR Instrucciones generales  Evite cualquier dieta que le impida ingerir la cantidad suficiente de comida. Hable con el mdico antes de comenzar una dieta nueva.  Tome los medicamentos de venta libre y los recetados solamente como se lo haya indicado el mdico.  Limite el consumo de alcohol a no ms de 1medid36mor da si es mujer y no est embarazSan Marinoedida68mor da si es hombre. Una medida equivale a 12onzas de cerveza, 5onzas de vino o 1onzas de bebidas alcohlicas de alta graduacin.  Concurra a todas las visitas de control como se lo haya indicado el  mdico. Esto es importante. Si usted tiene diabetes:  Asegrese de [employer]ant de la hipoglucemia.  Siempre tenga a mano una fuente de azcar, como por ejemplo: ? Azcar. ? Comprimidos de azcar. ? Gel de glucosa. ? Jugo de frutas. ? Gaseosa comn (gaseosa que no sea diettica). ? Leche. ? Caramelos duros. ? Miel.  Tome los medicamentos segn las  indicaciones.  Siga el plan de ejercicios y de alimentacin. ? Coma a horario. No omita comidas. ? Siga el plan para los das de enfermedad cuando no pueda comer o beber normalmente. Arme este plan de antemano con el mdico.  Contrlese la glucemia con la frecuencia que le haya indicado el mdico. Contrlesela siempre antes y despus de hacer actividad fsica.  Comparta su plan de atencin de la diabetes con estas personas: ? Compaeros de trabajo o de la escuela. ? Aquellas con las que [place].  Hgase anlisis de orina para Product manager presencia de cetonas: ? Cuando est enfermo. ? Como se lo haya indicado el mdico.  Lleve consigo una tarjeta, o use un brazalete o una medalla que indiquen que es diabtico. Si tiene un nivel bajo de glucosa en la sangre debido a otras causas:  Contrlese la glucemia con la frecuencia que le haya indicado el mdico.  Siga las indicaciones del mdico respecto de lo que no puede comer o beber. SOLICITE AYUDA SI:  Tiene problemas para mantener el nivel de glucosa en la sangre dentro del rango indicado.  Tiene la glucemia baja con frecuencia. SOLICITE AYUDA DE INMEDIATO SI:  Contina teniendo sntomas despus de haber comido o ingerido algo con azcar.  La glucemia es igual o inferior a '54mg'$ /dl (31mol/dl).  Tiene una crisis de movimientos que no pIT consultant  Se desmaya. Estos sntomas pueden iSales executive No espere hasta que los sntomas desaparezcan. Solicite atencin mdica de inmediato. Comunquese con el servicio de emergencias de su localidad (911 en los [place]). No conduzca por sus propios medios hPrincipal Financial Esta informacin no tiene cMarine scientistel consejo del mdico. Asegrese de hacerle al mdico cualquier pregunta que tenga. Document Released: 11/19/2010 Document Revised: 02/08/2016 Document Reviewed: 11/20/2015 Elsevier Interactive Patient Education  2[employer]in

## 2017-11-14 NOTE — Progress Notes (Signed)
Subjective:    Meredith Maynard is a 52 y.o. old female here for burning with urination Video interpreter was ID number [id number] was used for this encounter.   HPI Dysuria: went to Ed for UTI about 9 days ago. UA consistent with UTI.  She was treated with Keflex.  Urine culture was E. Coli  sensitive to Keflex.  She reports completing the whole course but dysuria persisted.  She also reports increased freq and urgency. Denies fever, chills or abdominal pain. No sure about hematuria b/c she is on her period. She reports back pain that has gotten worse over the last two days. Pain radiates to her flank. Intermittent mild dyspareunia but not new.   Diabetes: she was told her blood glucose was very high when she went to ED about 9 days ago. She is on metformin 1000 mg twice a day. She is not taking Jardiance. A1c 11.2% today, the same as prior.  Point-of-care glucose 339 today. Stopped her glipizide due to hypoglycemia.  She was also tried on Invokana but could not afford.  She does not have insurance. Denies drinking soda, juice or alcohol.  She does not exercise.  She has diabetic kit to check her blood glucose at home.   PMH/Problem List: has Diabetes mellitus type 2 in obese ([place]); Hyperlipidemia; OBESITY, NOS; Iron deficiency anemia due to chronic blood loss; ANXIETY; UNSPECIFIED HEARING LOSS; HYPERTENSION, BENIGN SYSTEMIC; GERD; DIZZINESS, CHRONIC; Sciatica of right side; Umbilical hernia; Back pain; Varicose vein of leg; Leg length discrepancy; Pes planus; Irregular periods/menstrual cycles; Shoulder mass; Chest pain; Multiple joint pain; Abdominal pain; LUQ abdominal pain; Right leg pain; Plantar fasciitis of right foot; Status post tooth extraction; Fatigue; Health care maintenance; Trigger finger, acquired; Musculoskeletal pain; Abdominal pain, epigastric; Unspecified constipation; Eye pain; Onychomycosis; Cough; Recurrent ventral incisional hernia with obstruction; Recurrent incisional hernia with  incarceration; BPPV (benign paroxysmal positional vertigo); Pelvic pain; Anemia; Tension headache; and Menorrhagia with regular cycle on their problem list.   has a past medical history of Anxiety, Fatty liver, Gastritis, GERD (gastroesophageal reflux disease), HLD (hyperlipidemia), Hypertension, Meniere syndrome, Panic attacks, Somatic complaints, multiple, and T2DM (type 2 diabetes mellitus) ([place]).  FH:  Family History  Problem Relation Age of Onset  . Hypertension Brother   . Stroke Brother 40  . Heart attack Brother 66    SH Social History   Tobacco Use  . Smoking status: Never Smoker  . Smokeless tobacco: Never Used  Substance Use Topics  . Alcohol use: No  . Drug use: No    Review of Systems Review of systems negative except for pertinent positives and negatives in history of present illness above.     Objective:     Vitals:   11/14/17 1505  BP: (!) 112/58  Pulse: 93  Temp: 98.1 F (36.7 C)  TempSrc: Oral  SpO2: 99%  Weight: 245 lb (111.1 kg)  Height: 5' 5" (1.651 m)   Body mass index is 40.77 kg/m.  Physical Exam  GEN: Obese, appears well, no apparent distress. Oropharynx: mmm without erythema or exudation HEM: negative for cervical or periauricular lymphadenopathies CVS: RRR, nl s1 & s2, no murmurs, no edema RESP: no IWOB, good air movement bilaterally, CTAB GI: Obese, limited exam, no tenderness GU: no suprapubic or CVA tenderness MSK: no focal tenderness or notable swelling SKIN: no apparent skin lesion NEURO: alert and oiented appropriately, no gross deficits  PSYCH: euthymic mood with congruent affect    Assessment and Plan:  1. Dysuria: she was  treated with appropriate antibiotic for recent E. coli UTI.  He UA is negative except for glucosuria.  No suprapubic or CVA tenderness and constitutional symptoms to think of pyelonephritis.  No hematuria think of nephrolithiasis.  She has no dyspareunia to think of vaginal atrophy.  She is still has  regular period.  Frequency and urgency could be due to her poorly controlled diabetes. Will send urine for urine culture.  I recommended adequate hydration.  We will manage diabetes as below.  2. Diabetes mellitus type 2 in obese [facility]): Poorly controlled.  A1c remains at 11.2%.  Point of care CBG 339.  She also have glucosuria.  No ketones.  Appears well for DKA or HHS.  Will continue metformin 1000 mg twice daily.  I added glipizide at 5 mg twice daily given history of hypoglycemia.  Recommended checking her blood glucose daily and as needed.  Follow-up in 2 weeks.  If she continues to tolerate her medication, will increase glipizide to 10 mg twice daily at that visit.  Gave her handout on diet and hypoglycemia.  Return in about 2 weeks (around 11/28/2017) for Diabetes/Dysuria.  Mercy Riding, MD 11/14/17 Pager: [phone]

## 2017-11-17 LAB — URINE CULTURE

## 2017-12-08 ENCOUNTER — Ambulatory Visit (INDEPENDENT_AMBULATORY_CARE_PROVIDER_SITE_OTHER): Payer: Self-pay | Admitting: Family Medicine

## 2017-12-08 VITALS — BP 120/80 | HR 85 | Temp 98.5°F

## 2017-12-08 DIAGNOSIS — R059 Cough, unspecified: Secondary | ICD-10-CM

## 2017-12-08 DIAGNOSIS — R05 Cough: Secondary | ICD-10-CM

## 2017-12-08 MED ORDER — BENZONATATE 200 MG PO CAPS: 200.0000 mg | ORAL_CAPSULE | Freq: Three times a day (TID) | ORAL | 0 refills | Status: DC | PRN

## 2017-12-08 MED ORDER — DEXTROMETHORPHAN HBR 15 MG/5ML PO SYRP: 10.0000 mL | ORAL_SOLUTION | Freq: Four times a day (QID) | ORAL | 0 refills | Status: DC | PRN

## 2017-12-08 NOTE — Progress Notes (Signed)
    Subjective:    Patient ID: Meredith Maynard, female    DOB: 12-22-1965, 52 y.o.   MRN: [id number]   CC: cough and congestion  Patient reports having cough and congestion for the past 2 weeks. She denies fevers during that time. She also endorses runny nose and sore throat. She had occasional muscle aches. She reports that she was doing well earlier this week and almost felt totally recovered however last night she felt more congested again and felt some body aches. She was worried she was getting sick again. Cough is productive of clear sputum. Nose is running clear fluid. She has been taking an allergy pill at the advice of her pharmacist without much improvement. SHe has not tried any other medication.   Smoking status reviewed- non-smoker  Review of Systems- see HPI   Objective:  BP 120/80 (BP Location: Left Arm, Patient Position: Sitting, Cuff Size: Large)   Pulse 85   Temp 98.5 F (36.9 C) (Oral)   SpO2 98%  Vitals and nursing note reviewed  General: well nourished, in no acute distress HEENT: normocephalic, TM's visualized bilaterally without effusion or bulging, no nasal discharge but prominent boggy nasal turbinates seen bilaterally, moist mucous membranes, good dentition without erythema or discharge noted in posterior oropharynx Neck: supple, non-tender, without lymphadenopathy Cardiac: RRR, clear S1 and S2, no murmurs, rubs, or gallops Respiratory: clear to auscultation bilaterally, no increased work of breathing Neuro: alert and oriented, no focal deficits   Assessment & Plan:   1. Cough She is well appearing and well hydrated on exam. No lymphadenopathy. Lungs clear. Afebrile with normal vital signs. Likely post- viral cough. Discussed course of viral cough lasting 2-3 weeks after infection. Will send in cough medication to her pharmacy. Recommended she continue the antihistamine for congestion and take tylenol as needed for pain. Return precautions given  for fever or worsening instead of improving. Patient verbalized understanding and agreement with plan.  Return if symptoms worsen or fail to improve.   Lucila Maine, DO Family Medicine Resident PGY-2

## 2017-12-08 NOTE — Patient Instructions (Addendum)
  I have sent in Granite for cough and DEXTROMETHORPHAN for cough to your pharmacy.  Please call is if you have a fever.  Tos en los adultos Cough, Adult La tos es un reflejo que despeja la garganta y las vas respiratorias. Toser ayuda a curar y [facility]. Es normal toser a Clinical cytogeneticist, pero si la tos aparece junto con otros sntomas o si dura [employer], puede indicar una enfermedad que necesita [place]. La tos puede durar solo 2 o 3semanas (aguda) o ms de 8semanas (crnica). Cules son las causas? Por lo general, las causas de la tos son las siguientes:  Aspirar sustancias que [employer].  Una infeccin respiratoria de origen viral o bacteriano.  Alergias.  Asma.  Goteo posnasal.  Fumar.  cido que vuelve del estmago hacia el esfago (reflujo gastroesofgico ).  Ciertos medicamentos.  Enfermedades pulmonares crnicas, incluida la EPOC (o rara vez, cncer de pulmn).  Otras afecciones, por ejemplo, insuficiencia cardaca.  Siga estas indicaciones en su casa: Est atento a cualquier cambio en los sntomas. Tome estas medidas para Public house manager las molestias:  Tome los medicamentos solamente como se lo haya indicado el mdico. ? Si le recetaron un antibitico, tmelo como se lo haya indicado el mdico. No deje de tomar los antibiticos aunque comience a Sports administrator. ? Hable con el mdico antes de tomar un medicamento para la tos (antitusivo).  Beba suficiente lquido para Consulting civil engineer orina clara o de color amarillo plido.  Si el aire est seco, use un vaporizador o humidificador de niebla fra en la habitacin o en la casa para ayudar a aflojar las secreciones.  Evite todo lo que le provoque tos en el trabajo o en su casa.  Si la tos empeora por la noche, pruebe a dormir en posicin semierguida.  Evite el humo del cigarrillo. Si fuma, deje de hacerlo. Si necesita ayuda para dejar de fumar, consulte al mdico.  Evite la cafena.  Evite el  alcohol.  Descanse todo lo que sea necesario.  Comunquese con un mdico si:  Aparecen nuevos sntomas.  Tose y escupe pus.  La tos no mejora despus de 2 o 3semanas o empeora.  No puede controlar la tos con antitusivos y no puede dormir debido a Presenter, broadcasting.  Siente un dolor que empeora o que no puede controlar con medicamentos.  Tiene fiebre.  Baja de peso sin causa aparente.  Tiene transpiracin nocturna. Solicite ayuda de inmediato si:  Tose y [employer].  Tiene dificultad para respirar.  El [employer] late Baxter rpido. Esta informacin no tiene Marine scientist el consejo del mdico. Asegrese de hacerle al mdico cualquier pregunta que tenga. Document Released: 05/25/2011 Document Revised: 01/19/2017 Document Reviewed: 12/24/2014 Elsevier Interactive Patient Education  Henry Schein.

## 2018-02-12 NOTE — Progress Notes (Deleted)
   [facility] Phone: [phone]   Date of Visit: 02/13/2018   HPI:  Shoulder mass?  ROS: See HPI.  [place]:  PMH: HTN DM2, uncontrolled  GERD Obesity  PHYSICAL EXAM: There were no vitals taken for this visit. Gen: *** HEENT: *** Heart: *** Lungs: *** Neuro: *** Ext: ***  ASSESSMENT/PLAN:  Health maintenance:  -***  No problem-specific Assessment & Plan notes found for this encounter.  FOLLOW UP: Follow up in *** for ***  Smiley Houseman, MD PGY [place]

## 2018-02-13 ENCOUNTER — Encounter (HOSPITAL_COMMUNITY): Payer: Self-pay | Admitting: Emergency Medicine

## 2018-02-13 ENCOUNTER — Emergency Department (HOSPITAL_COMMUNITY)
Admission: EM | Admit: 2018-02-13 | Discharge: 2018-02-13 | Disposition: A | Discharge status: Home / Self Care | Payer: Self-pay | Attending: Emergency Medicine | Admitting: Emergency Medicine

## 2018-02-13 ENCOUNTER — Other Ambulatory Visit: Payer: Self-pay

## 2018-02-13 ENCOUNTER — Ambulatory Visit: Payer: Self-pay | Admitting: Internal Medicine

## 2018-02-13 ENCOUNTER — Emergency Department (HOSPITAL_COMMUNITY): Payer: Self-pay

## 2018-02-13 DIAGNOSIS — E1165 Type 2 diabetes mellitus with hyperglycemia: Secondary | ICD-10-CM

## 2018-02-13 DIAGNOSIS — E119 Type 2 diabetes mellitus without complications: Secondary | ICD-10-CM | POA: Insufficient documentation

## 2018-02-13 DIAGNOSIS — D649 Anemia, unspecified: Secondary | ICD-10-CM | POA: Insufficient documentation

## 2018-02-13 DIAGNOSIS — M25512 Pain in left shoulder: Secondary | ICD-10-CM

## 2018-02-13 DIAGNOSIS — Z7984 Long term (current) use of oral hypoglycemic drugs: Secondary | ICD-10-CM | POA: Insufficient documentation

## 2018-02-13 DIAGNOSIS — I10 Essential (primary) hypertension: Secondary | ICD-10-CM | POA: Insufficient documentation

## 2018-02-13 DIAGNOSIS — Z79899 Other long term (current) drug therapy: Secondary | ICD-10-CM | POA: Insufficient documentation

## 2018-02-13 DIAGNOSIS — M62838 Other muscle spasm: Secondary | ICD-10-CM | POA: Insufficient documentation

## 2018-02-13 DIAGNOSIS — R0789 Other chest pain: Secondary | ICD-10-CM | POA: Insufficient documentation

## 2018-02-13 DIAGNOSIS — Z7982 Long term (current) use of aspirin: Secondary | ICD-10-CM | POA: Insufficient documentation

## 2018-02-13 LAB — BASIC METABOLIC PANEL
Anion gap: 10 (ref 5–15)
BUN: 13 mg/dL (ref 6–20)
CHLORIDE: 101 mmol/L (ref 101–111)
CO2: 24 mmol/L (ref 22–32)
Calcium: 9.6 mg/dL (ref 8.9–10.3)
Creatinine, Ser: 0.56 mg/dL (ref 0.44–1.00)
GFR calc non Af Amer: >60 mL/min (ref >60)
Glucose, Bld: 330 mg/dL — ABNORMAL HIGH (ref 65–99)
POTASSIUM: 3.9 mmol/L (ref 3.5–5.1)
SODIUM: 135 mmol/L (ref 135–145)

## 2018-02-13 LAB — I-STAT TROPONIN, ED: Troponin i, poc: 0 ng/mL (ref 0.00–0.08)

## 2018-02-13 LAB — CBC
HEMATOCRIT: 32.2 % — AB (ref 36.0–46.0)
Hemoglobin: 9.2 g/dL — ABNORMAL LOW (ref 12.0–15.0)
MCH: 20 pg — ABNORMAL LOW (ref 26.0–34.0)
MCHC: 28.6 g/dL — ABNORMAL LOW (ref 30.0–36.0)
MCV: 70.2 fL — AB (ref 78.0–100.0)
Platelets: 231 10*3/uL (ref 150–400)
RBC: 4.59 MIL/uL (ref 3.87–5.11)
RDW: 20.4 % — ABNORMAL HIGH (ref 11.5–15.5)
WBC: 7.6 10*3/uL (ref 4.0–10.5)

## 2018-02-13 LAB — I-STAT BETA HCG BLOOD, ED (MC, WL, AP ONLY)

## 2018-02-13 MED ORDER — MELOXICAM 15 MG PO TABS: 15.0000 mg | ORAL_TABLET | Freq: Every day | ORAL | 0 refills | Status: DC

## 2018-02-13 MED ORDER — CYCLOBENZAPRINE HCL 10 MG PO TABS: 10.0000 mg | ORAL_TABLET | Freq: Three times a day (TID) | ORAL | 0 refills | Status: DC | PRN

## 2018-02-13 MED ORDER — CYCLOBENZAPRINE HCL 10 MG PO TABS
10.0000 mg | ORAL_TABLET | Freq: Once | ORAL | Status: AC
  Administered 2018-02-13: 10 mg via ORAL
  Filled 2018-02-13: qty 1

## 2018-02-13 MED ORDER — MELOXICAM 15 MG PO TABS
15.0000 mg | ORAL_TABLET | Freq: Once | ORAL | Status: AC
  Administered 2018-02-13: 15 mg via ORAL
  Filled 2018-02-13 (×2): qty 1

## 2018-02-13 NOTE — Discharge Instructions (Signed)
Your labs, xray, and EKG are all reassuring, your pain is likely muscle pain and spasms from your repetitive movements at your job. Use mobic daily as directed, don't use additional NSAIDs like ibuprofen/aleve/etc while taking mobic. Use additional tylenol as needed for additional pain relief.  Use flexeril as directed as needed for muscle spasms, but don't drive or operate machinery while taking this medication. Stay well hydrated. You may consider using heat to the areas of pain, no more than 20 minutes every hour. Follow up with your regular doctor in 1 week for recheck of symptoms. Return to the ER for changes or worsening symptoms.  SEEK IMMEDIATE MEDICAL ATTENTION IF: You develop a fever.  Your chest pains become severe or intolerable.  You develop new, unexplained symptoms (problems).  You develop shortness of breath, nausea, vomiting, sweating or feel light headed.  You develop a new cough or you cough up blood. You develop new leg swelling

## 2018-02-13 NOTE — ED Triage Notes (Signed)
Pt Has been having CP for a month. Pain goes into her back. Pt also experiencing numbness /tingling/pain in left arm at 3:30pm. Neuro intact. Sensation intact. No drift/grips equal.

## 2018-02-13 NOTE — ED Provider Notes (Signed)
[facility] EMERGENCY DEPARTMENT Provider Note   CSN: [id number] Arrival date & time: 02/13/18  1627     History   Chief Complaint Chief Complaint  Patient presents with  . Chest Pain    HPI Meredith Maynard is a 52 y.o. female with a PMHx of HTN, HLD, GERD, anxiety, somatization disorder, DM2, meniere's syndrome, BPPV, anemia, headaches, fatty liver, and multiple other conditions listed below, who presents to the ED with complaints of left-sided chest pain and left shoulder/arm pain which has been intermittent for the last 2 months.  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  She states that in the past she had some right shoulder pain that feels similar to today's symptoms, and was told at that time that she had "a cartilage issue".  She states that her pain moves around, sometimes starting in the left shoulder blade area and going into the left anterior chest and down the left arm, but occasionally it starts in the left arm and goes up into the shoulder and chest.  She describes the pain as 8/10 intermittent crampy and squeezing pain that she says feels "like a muscle pain", located mostly in the left shoulder blade area radiating into the left chest and left arm, worse with movement or being cold, unchanged with inspiration or exertion, and moderately improved with heat, Advil, and Flanax (NSAID).  She states that it feels like her muscles cramp up in her shoulder blade area and that is what is causing the pain.  She is a non-smoker.  She reports a family history of MI in her brother at age 69 who passed away of his MI, and another brother who survived his MIs but she is not sure exactly how old he was.  Lastly she mentions that she gets bilateral lower extremity swelling because she stands all day at her job, but states that this is unchanged from her baseline and not acutely worse today.  She denies  diaphoresis, lightheadedness, fevers, chills, cough, SOB, new/worsening/unilateral LE swelling, abd pain, N/V/D/C, hematuria, dysuria, claudication, orthopnea, numbness, tingling, focal weakness, or any other complaints at this time.   The history is provided by the patient and medical records. A language interpreter was used (provider).  Chest Pain   This is a new problem. The current episode started more than 1 week ago. The problem occurs daily. The problem has not changed since onset.The pain is associated with raising an arm (arm movements or cold exposure). The pain is present in the lateral region. The pain is at a severity of 8/10. The pain is moderate. Quality: cramping and squeezing. The pain radiates to the left shoulder and left arm. Duration of episode(s) is 2 months. Exacerbated by: arm movements and cold exposure. Pertinent negatives include no abdominal pain, no claudication, no cough, no diaphoresis, no fever, no lower extremity edema (nothing new or worse from baseline), no nausea, no numbness, no orthopnea, no shortness of breath, no vomiting and no weakness. Treatments tried: advil and flanax (NSAID) and heat. The treatment provided moderate relief. Risk factors include obesity.  Her past medical history is significant for diabetes, hyperlipidemia and hypertension.  Her family medical history is significant for CAD and early MI.    Past Medical History:  Diagnosis Date  . Anxiety   . Fatty liver   . Gastritis   . GERD (gastroesophageal reflux disease)   . HLD (hyperlipidemia)   . Hypertension   .  Meniere syndrome   . Panic attacks   . Somatic complaints, multiple   . T2DM (type 2 diabetes mellitus) [facility])     Patient Active Problem List   Diagnosis Date Noted  . Menorrhagia with regular cycle 05/25/2017  . Tension headache 04/26/2017  . Anemia 10/27/2016  . Pelvic pain 09/05/2016  . BPPV (benign paroxysmal positional vertigo) 02/02/2016  . Recurrent incisional hernia  with incarceration 01/22/2016  . Recurrent ventral incisional hernia with obstruction 01/04/2016  . Cough 12/17/2015  . Eye pain 02/20/2015  . Onychomycosis 02/20/2015  . Unspecified constipation 05/26/2014  . Abdominal pain, epigastric 11/28/2013  . Musculoskeletal pain 08/14/2013  . Health care maintenance 04/25/2013  . Trigger finger, acquired 04/25/2013  . Fatigue 04/24/2013  . Status post tooth extraction 11/21/2012  . Plantar fasciitis of right foot 07/31/2012  . Right leg pain 07/08/2012  . LUQ abdominal pain 04/30/2012  . Abdominal pain 04/27/2012  . Multiple joint pain 04/11/2012  . Chest pain 12/14/2011  . Shoulder mass 12/08/2011  . Irregular periods/menstrual cycles 09/28/2011  . Leg length discrepancy 09/27/2011  . Pes planus 09/27/2011  . Back pain 08/31/2011  . 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 Umbilical hernia 19/41/7408  . Sciatica of right side 04/18/2011  . GERD 04/08/2009  . Hyperlipidemia 03/20/2009  . UNSPECIFIED HEARING LOSS 12/31/2008  . Diabetes mellitus type 2 in obese ([place]) 01/17/2007  . DIZZINESS, CHRONIC 01/17/2007  . OBESITY, NOS 12/28/2006  . Iron deficiency anemia due to chronic blood loss 12/28/2006  . ANXIETY 12/28/2006  . HYPERTENSION, BENIGN SYSTEMIC 12/28/2006    Past Surgical History:  Procedure Laterality Date  . Abdominal U/S  2010   Other than fatty liver, normal  . CESAREAN SECTION  2000,2004,2005   x3  . Exercise Treadmill Test  2010   Normal, poor exercise tolerance  . INCISIONAL HERNIA REPAIR N/A 01/04/2016   Procedure: RECURRENT INCISIONAL HERNIA;  Surgeon: Fanny Skates, MD;  Location: [place];  Service: General;  Laterality: N/A;  . INSERTION OF MESH N/A 01/04/2016   Procedure: INSERTION OF MESH;  Surgeon: Fanny Skates, MD;  Location: [place];  Service: General;  Laterality: N/A;  PHASIX MESH  . LAPAROTOMY N/A 01/04/2016   Procedure: EXPLORATORY LAPAROTOMY AND LYSIS OF ADHESIONS ;  Surgeon: Fanny Skates, MD;  Location: [place];  Service: General;  Laterality: N/A;  . SMALL INTESTINE SURGERY  2007   Dr. Hulen Skains  . TUBAL LIGATION    . UMBILICAL HERNIA REPAIR  2007   Dr. Hulen Skains     OB History   None      Home Medications    Prior to Admission medications   Medication Sig Start Date End Date Taking? Authorizing Provider  aspirin EC 81 MG tablet Take 1 tablet (81 mg total) by mouth daily. 05/01/13   Timmothy Euler, MD  benzonatate (TESSALON) 200 MG capsule Take 1 capsule (200 mg total) by mouth 3 (three) times daily as needed for cough. 12/08/17   Steve Rattler, DO  Blood Glucose Monitoring Suppl (AGAMATRIX PRESTO) w/Device KIT 1 Device by Does not apply route daily. 01/19/16   Frazier Richards, MD  dextromethorphan 15 MG/5ML syrup Take 10 mLs (30 mg total) by mouth 4 (four) times daily as needed for cough. 12/08/17   Steve Rattler, DO  famotidine (PEPCID) 20 MG tablet Take 1 tablet (20 mg total) by mouth 2 (two) times daily. Patient taking differently: Take 20 mg by mouth 2 (two) times  daily as needed for heartburn or indigestion.  05/26/14   Timmothy Euler, MD  ferrous sulfate 325 (65 FE) MG EC tablet Take 1 tablet (325 mg total) by mouth 2 (two) times daily with a meal. 10/28/16   Rogue Bussing, MD  fluticasone [facility]) 50 MCG/ACT nasal spray Place 2 sprays into both nostrils daily. Patient taking differently: Place 2 sprays into both nostrils daily as needed for allergies.  12/17/15   Olam Idler, MD  glipiZIDE (GLUCOTROL) 5 MG tablet Take 1 tablet (5 mg total) by mouth 2 (two) times daily before a meal. 11/14/17   Mercy Riding, MD  glucose blood (WAVESENSE PRESTO) test strip Use as instructed 01/19/16   Frazier Richards, MD  ibuprofen (ADVIL,MOTRIN) 200 MG tablet Take 400 mg by mouth every 6 (six) hours as needed for moderate pain. For pain    [provider]  Lancets MISC 1 Device by Does not apply route daily. 01/19/16   Frazier Richards, MD  lisinopril (PRINIVIL,ZESTRIL) 20 MG tablet  Take 1 tablet (20 mg total) by mouth daily. 11/14/17   Mercy Riding, MD  meclizine (ANTIVERT) 25 MG tablet Take 1 tablet (25 mg total) by mouth 3 (three) times daily as needed for dizziness. 02/02/16   Karamalegos, Devonne Doughty, DO  metFORMIN (GLUCOPHAGE) 1000 MG tablet Take 1 tablet (1,000 mg total) by mouth 2 (two) times daily with a meal. 11/14/17   Mercy Riding, MD  Multiple Vitamins-Minerals (MULTIVITAMIN WITH MINERALS) tablet Take 1 tablet by mouth daily.    [provider]  omeprazole (PRILOSEC) 20 MG capsule Take 1 capsule (20 mg total) by mouth daily. Patient taking differently: Take 20 mg by mouth daily as needed (acid reflux).  11/27/13   Losq, Burnell Blanks, MD  polyethylene glycol (MIRALAX / GLYCOLAX) packet Take 17 g by mouth daily. Patient taking differently: Take 17 g by mouth daily as needed for mild constipation.  09/07/16   Rogue Bussing, MD  promethazine (PHENERGAN) 25 MG suppository Place 1 suppository (25 mg total) rectally every 8 (eight) hours as needed for nausea or vomiting. Patient not taking: Reported on 11/05/2017 02/02/16   Olin Hauser, DO  ranitidine (ZANTAC) 150 MG tablet Take 1 tablet (150 mg total) by mouth 2 (two) times daily for 5 days. 11/05/17 11/10/17  Duffy Bruce, MD    Family History Family History  Problem Relation Age of Onset  . Hypertension Brother   . Stroke Brother 40  . Heart attack Brother 28    Social History Social History   Tobacco Use  . Smoking status: Never Smoker  . Smokeless tobacco: Never Used  Substance Use Topics  . Alcohol use: No  . Drug use: No     Allergies   Latex   Review of Systems Review of Systems  Constitutional: Negative for chills, diaphoresis and fever.  Respiratory: Negative for cough and shortness of breath.   Cardiovascular: Positive for chest pain. Negative for orthopnea, claudication and leg swelling (nothing acute).  Gastrointestinal: Negative for abdominal pain,  constipation, diarrhea, nausea and vomiting.  Genitourinary: Negative for dysuria and hematuria.  Musculoskeletal: Positive for arthralgias and myalgias.  Skin: Negative for color change.  Allergic/Immunologic: Positive for immunocompromised state (DM2).  Neurological: Negative for weakness, light-headedness and numbness.  Psychiatric/Behavioral: Negative for confusion.   All other systems reviewed and are negative for acute change except as noted in the HPI.    Physical Exam Updated Vital Signs BP Marland Kitchen)  141/80 (BP Location: Left Arm)   Pulse 86   Temp 98.9 F (37.2 C) (Oral)   Resp 18   Ht 5' 4"  (1.626 m)   Wt 113.4 kg (250 lb)   LMP 01/23/2018 (Approximate)   SpO2 98%   BMI 42.91 kg/m   Physical Exam  Constitutional: She is oriented to person, place, and time. Vital signs are normal. She appears well-developed and well-nourished.  Non-toxic appearance. No distress.  Afebrile, nontoxic, NAD  HENT:  Head: Normocephalic and atraumatic.  Mouth/Throat: Oropharynx is clear and moist and mucous membranes are normal.  Eyes: Conjunctivae and EOM are normal. Right eye exhibits no discharge. Left eye exhibits no discharge.  Neck: Normal range of motion. Neck supple. No spinous process tenderness present. No neck rigidity. Normal range of motion present.  FROM intact without spinous process TTP, no bony stepoffs or deformities. No rigidity or meningeal signs. No bruising or swelling.   Cardiovascular: Normal rate, regular rhythm, normal heart sounds and intact distal pulses. Exam reveals no gallop and no friction rub.  No murmur heard. RRR, nl s1/s2, no m/r/g, distal pulses intact, trace b/l pedal edema which pt states is chronic and unchanged  Pulmonary/Chest: Effort normal and breath sounds normal. No respiratory distress. She has no decreased breath sounds. She has no wheezes. She has no rhonchi. She has no rales. She exhibits tenderness. She exhibits no crepitus, no deformity and no  retraction.  CTAB in all lung fields, no w/r/r, no hypoxia or increased WOB, speaking in full sentences, SpO2 98% on RA Chest wall with mild TTP to L upper anterior chest wall near the pectoralis musculature, without crepitus, deformities, or retractions     Abdominal: Soft. Normal appearance and bowel sounds are normal. She exhibits no distension. There is no tenderness. There is no rigidity, no rebound, no guarding, no CVA tenderness, no tenderness at McBurney's point and negative Murphy's sign.  Musculoskeletal: Normal range of motion.       Left shoulder: She exhibits tenderness and spasm. She exhibits normal range of motion, no bony tenderness, no swelling, no effusion, no crepitus, no deformity, normal pulse and normal strength.  L shoulder with FROM intact, no bony TTP, with diffuse muscular TTP and spasms around the scapula and into the trapezius muscle, no swelling/effusion, no bruising or erythema, no warmth, no crepitus/deformity, negative apley scratch, +pain with resisted int/ext rotation, +pain elicited with empty can test. Strength and sensation grossly intact in all extremities, distal pulses intact.   MAE x4 Gait steady Trace b/l pedal edema, neg homan's bilaterally   Neurological: She is alert and oriented to person, place, and time. She has normal strength. No sensory deficit.  Skin: Skin is warm, dry and intact. No rash noted.  Psychiatric: She has a normal mood and affect.  Nursing note and vitals reviewed.    ED Treatments / Results  Labs (all labs ordered are listed, but only abnormal results are displayed) Labs Reviewed  BASIC METABOLIC PANEL - Abnormal; Notable for the following components:      Result Value   Glucose, Bld 330 (*)    All other components within normal limits  CBC - Abnormal; Notable for the following components:   Hemoglobin 9.2 (*)    HCT 32.2 (*)    MCV 70.2 (*)    MCH 20.0 (*)    MCHC 28.6 (*)    RDW 20.4 (*)    All other components  within normal limits  I-STAT TROPONIN, ED  I-STAT BETA HCG BLOOD, ED ([facility], [facility], [facility] ONLY)    EKG EKG Interpretation  Date/Time:  Tuesday February 13 2018 16:39:58 EDT Ventricular Rate:  98 PR Interval:  138 QRS Duration: 88 QT Interval:  354 QTC Calculation: 451 R Axis:   41 Text Interpretation:  Normal sinus rhythm Normal ECG Normal ECG Confirmed by Carmin Muskrat [phone]) on 02/13/2018 9:25:18 PM   Radiology Dg Chest 2 View  Result Date: 02/13/2018 CLINICAL DATA:  52 y/o F; left-sided chest pain for a month. Numbness, tingling, pain and left arm. EXAM: CHEST - 2 VIEW COMPARISON:  11/05/2017 chest radiograph FINDINGS: Stable heart size and mediastinal contours are within normal limits. Both lungs are clear. Mild lumbar spine levocurvature. IMPRESSION: No acute pulmonary process identified. Electronically Signed   By: Kristine Garbe M.D.   On: 02/13/2018 18:19    Procedures Procedures (including critical care time)  Medications Ordered in ED Medications  cyclobenzaprine (FLEXERIL) tablet 10 mg (10 mg Oral Given 02/13/18 2210)  meloxicam (MOBIC) tablet 15 mg (15 mg Oral Given 02/13/18 2210)     Initial Impression / Assessment and Plan / ED Course  I have reviewed the triage vital signs and the nursing notes.  Pertinent labs & imaging results that were available during my care of the patient were reviewed by me and considered in my medical decision making (see chart for details).     52 y.o. female here with L shoulder/arm pain that occasionally goes to her L chest and visa versa, ongoing intermittently x2 months. 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 States it feels similar to pain she had in her right shoulder several years ago that ended up being "a cartilage problem". On exam, reproducible L anterior chest wall TTP near the pectoralis musculature, and diffusely around the L scapula and into the L trapezius, muscle  spasms around the shoulder as well. Extremities NVI, trace b/l pedal edema which pt states is chronic and unchanged; clear lung exam, VSS, no tachycardia or hypoxia. Work up thus far reveals: EKG unremarkable, trop neg, betaHCG neg, BMP with gluc 330 but otherwise unremarkable, CBC with stable chronic anemia, CXR negative. Highly doubt ACS/PE/dissection or other acute emergent pathology. Likely tendinitis of rotator cuff with muscle spasms vs other musculoskeletal etiology. Will give mobic and flexeril, and send home with rx for same. Advised use of heat and tylenol. F/up with PCP in 1wk for recheck of symptoms and ongoing management. I explained the diagnosis and have given explicit precautions to return to the ER including for any other new or worsening symptoms. The patient understands and accepts the medical plan as it's been dictated and I have answered their questions. Discharge instructions concerning home care and prescriptions have been given. The patient is STABLE and is discharged to home in good condition.    Final Clinical Impressions(s) / ED Diagnoses   Final diagnoses:  Acute pain of left shoulder  Chest wall pain  Chronic anemia  Type 2 diabetes mellitus with hyperglycemia, without long-term current use of insulin (HCC)  Muscle spasm of left shoulder    ED Discharge Orders        Ordered    meloxicam (MOBIC) 15 MG tablet  Daily     02/13/18 2101    cyclobenzaprine (FLEXERIL) 10 MG tablet  3 times daily PRN     02/13/18 [address] [place], [place] 02/13/18 2212  Carmin Muskrat, MD 02/15/18 1504

## 2018-02-13 NOTE — ED Notes (Signed)
ED Provider at bedside. 

## 2018-02-21 ENCOUNTER — Ambulatory Visit: Payer: Self-pay | Admitting: Family Medicine

## 2018-02-21 NOTE — Progress Notes (Signed)
Subjective:    Patient ID: Meredith Maynard , female   DOB: 03/07/1966 , 52 y.o..   MRN: [id number]  HPI  *Video Spanish interpreter used*  Meredith Maynard is a 52 yo F with PMH of T2DM, HTN, multiple MSK issues, anxiety and Anemia here for   *A Spanish interpreter was used for this visit*  1. Neck pain/left arm pain: Patient coming in today to discuss pains that she has been having. She notes that for about 2 months she has had pain in her left upper back that radiates all the way down her left arm to her fingers. She notes that the pain in her back feels achy and the radiating pain is "tingling". Aggravating factors include: turning her head to the left, driving, laying flat on her back, and cold wind. No alleviating factors identified. No history of trauma. 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   Went to the ED on 4/16 for this issue because the pain was radiating to her left chest. Had a negative cardiac work up and her issues were thought to be MSK related. She was given a rx for Mobic and flexeril, this did not provide any relief for her.   Review of Systems: Per HPI.   Past Medical History:  Medications: reviewed and updated  Social Hx:  reports that she has never smoked. She has never used smokeless tobacco.   Objective:   BP 122/80 (BP Location: Left Arm, Patient Position: Sitting, Cuff Size: Large)   Pulse 82   Temp 98.5 F (36.9 C) (Oral)   Wt 241 lb 9.6 oz (109.6 kg)   LMP 01/23/2018 (Approximate)   SpO2 99%   BMI 41.47 kg/m  Physical Exam  Gen: NAD, alert, cooperative with exam, well-appearing Psych: good insight, normal mood and affect Neck: Inspection: Normal head position, no muscular atrophy  Palpation: No midline cervical tenderness or muscle spasms Range of Motion: Full range of motion. Normal neck flexion,  extension, lateral movements, and side bending. Pain felt with left lateral movement Strength: 5/5 flexion, extension, lateral movements, and side bending. 5/5 upper extremity strength bilaterally.  Sensation: Gross sensation intact in neck and upper extremities Negative Spurlings  Left Shoulder: Inspection reveals no abnormalities, atrophy or asymmetry. Palpation is normal with no tenderness over AC joint or bicipital groove. ROM is full in all planes. Rotator cuff strength normal throughout. No signs of impingement with negative Neer and Hawkin's tests, empty can sign. No painful arc and no drop arm sign. No apprehension sign   Assessment & Plan:   1. Cervicalgia with radiation to left arm: Suspect patient's symptoms stemming from cervical osteoarthritis. Could possibly have some rotator cuff syndrome as well however left shoulder exam without any abnormalities today. Cervical exam also without any abnormalities however pain and radiculopathy endorsed with left lateral head movements. 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 Possible stressor overlay to symptoms as well, many prior MSK issues on problem list.  - DG Cervical Spine Complete; Future - DG Thoracic Spine 2 View; Future - Gabapentin 100 mg TID for possible radiculopathy - Can take OTC NSAIDS such as Ibuprofen as needed for pain - Can consider referral to sports medicine and/or referral to PT based on XRAY results - Follow up in 1 week  Smitty Cords, MD [place], PGY-3

## 2018-02-22 ENCOUNTER — Ambulatory Visit (INDEPENDENT_AMBULATORY_CARE_PROVIDER_SITE_OTHER): Payer: Self-pay | Admitting: Family Medicine

## 2018-02-22 ENCOUNTER — Encounter: Payer: Self-pay | Admitting: Family Medicine

## 2018-02-22 VITALS — BP 122/80 | HR 82 | Temp 98.5°F | Wt 241.6 lb

## 2018-02-22 DIAGNOSIS — M542 Cervicalgia: Secondary | ICD-10-CM

## 2018-02-22 MED ORDER — GABAPENTIN 100 MG PO CAPS: 100.0000 mg | ORAL_CAPSULE | Freq: Three times a day (TID) | ORAL | 3 refills | Status: DC

## 2018-02-22 NOTE — Patient Instructions (Signed)
Thank you for coming in today, it was so nice to see you! Today we talked about:    Pain: I think this is coming back your neck. We have ordered some XRAYS for you. You can get these done at [facility] on the 1st floor  I have sent in a medication called gabapentin, you can take this up to 3 times a day.  Do not take the Flexeril and the meloxicam with this medicine  Please follow up in 1 week. You can schedule this appointment at the front desk before you leave or call the clinic.  If you have any questions or concerns, please do not hesitate to call the office at [phone]. You can also message me directly via MyChart.   Sincerely,  Smitty Cords, MD

## 2018-02-26 ENCOUNTER — Telehealth: Payer: Self-pay | Admitting: Family Medicine

## 2018-02-26 ENCOUNTER — Encounter: Payer: Self-pay | Admitting: Family Medicine

## 2018-02-26 ENCOUNTER — Ambulatory Visit (HOSPITAL_COMMUNITY)
Admission: RE | Admit: 2018-02-26 | Discharge: 2018-02-26 | Disposition: A | Discharge status: Home / Self Care | Payer: Self-pay | Source: Ambulatory Visit | Attending: Family Medicine | Admitting: Family Medicine

## 2018-02-26 DIAGNOSIS — M4802 Spinal stenosis, cervical region: Secondary | ICD-10-CM | POA: Insufficient documentation

## 2018-02-26 DIAGNOSIS — M542 Cervicalgia: Secondary | ICD-10-CM | POA: Insufficient documentation

## 2018-02-26 DIAGNOSIS — M50322 Other cervical disc degeneration at C5-C6 level: Secondary | ICD-10-CM | POA: Insufficient documentation

## 2018-02-26 NOTE — Telephone Encounter (Signed)
Called patient with phone Spanish interpreter to discuss her XRAY results. Cervical XRAY showing severe degenerative disc disease (arthritis) and mild stenosis. Tried calling both numbers, no answer and no option to leave voicemail. Will send a letter in the mail.   Smitty Cords, MD [place], PGY-3

## 2018-03-05 NOTE — Progress Notes (Signed)
   Subjective:    Patient ID: Meredith Maynard , female   DOB: 12-27-1965 , 52 y.o..   MRN: [id number]  HPI  *Video Spanish interpreter used*  [place] is a 52 yo F with PMH of T2DM, HTN, multiple MSK issues, anxiety and Anemia here for   *A Spanish interpreter was used for this visit*  1. Neck pain/left arm pain: Patient is coming in today to discuss her neck and back imaging.  She notes that since starting the gabapentin her symptoms have improved slightly but have still not resolved.  Her shooting pains that stem from her neck and go all the way down her left arm continued to be prevalent.  Her pain is worse at the end of the day.  She denies any weakness.  Does admit to some numbness and tingling occasionally in her left hand.   Review of Systems: Per HPI.   Social Hx:  reports that she has never smoked. She has never used smokeless tobacco.   Objective:   BP 128/70   Pulse 85   Temp 98.2 F (36.8 C) (Oral)   Wt 240 lb (108.9 kg)   SpO2 98%   BMI 41.20 kg/m  Physical Exam  Gen: NAD, alert, cooperative with exam, well-appearing Psych: good insight, normal mood and affect Neck: Inspection: Normal head position, no muscular atrophy  Palpation: No midline cervical tenderness or muscle spasms Range of Motion: Full range of motion. Normal neck flexion, extension, lateral movements, and side bending. Pain felt with left lateral movement Strength: 5/5 flexion, extension, lateral movements, and side bending. 5/5 upper extremity strength bilaterally.  Sensation: Gross sensation intact in neck and upper extremities Negative Spurlings  Assessment & Plan:   1.  Osteoarthritis of cervical spine with radiculopathy: Cervical x-ray showing severe degenerative disc disease at C5-C6 with mild bilateral neural foraminal stenosis.  Patient symptoms are most certainly related to the signs of osteoarthritis seen on her x-ray.  No red flag symptoms including  weakness.   -Discussed with patient that ultimately she would benefit from seeing a neurosurgeon, she is self-pay and would like to hold off on that at this time - Gabapentin 100 mg in the morning, midday and 200 mg at night -800 mg ibuprofen as needed, discussed that she is needing to take this every day and to let me know.  Discussed side effects of daily NSAID use -Follow-up in 1 month  Also obtained hemoglobin A1c today, have her reschedule appointment to discuss her diabetes.  Smitty Cords, MD [place], PGY-3

## 2018-03-06 ENCOUNTER — Ambulatory Visit (INDEPENDENT_AMBULATORY_CARE_PROVIDER_SITE_OTHER): Payer: Self-pay | Admitting: Family Medicine

## 2018-03-06 ENCOUNTER — Other Ambulatory Visit: Payer: Self-pay

## 2018-03-06 ENCOUNTER — Encounter: Payer: Self-pay | Admitting: Family Medicine

## 2018-03-06 VITALS — BP 128/70 | HR 85 | Temp 98.2°F | Wt 240.0 lb

## 2018-03-06 DIAGNOSIS — M4722 Other spondylosis with radiculopathy, cervical region: Secondary | ICD-10-CM

## 2018-03-06 DIAGNOSIS — E1169 Type 2 diabetes mellitus with other specified complication: Secondary | ICD-10-CM

## 2018-03-06 DIAGNOSIS — E669 Obesity, unspecified: Secondary | ICD-10-CM

## 2018-03-06 LAB — POCT GLYCOSYLATED HEMOGLOBIN (HGB A1C): Hemoglobin A1C: 9.7

## 2018-03-06 MED ORDER — IBUPROFEN 800 MG PO TABS: 800.0000 mg | ORAL_TABLET | Freq: Three times a day (TID) | ORAL | 0 refills | Status: DC | PRN

## 2018-03-06 NOTE — Patient Instructions (Signed)
Thank you for coming in today, it was so nice to see you! Today we talked about:    Neck and arm pain: This is coming from arthritis in your neckPlease take the gabapentin 100 mg in the morning, 100 mg at lunch, and 200 mg at night.  You can take the ibuprofen just as needed when your pain gets severe  Please follow up in 3-4 weeks for pain and diabetes. You can schedule this appointment at the front desk before you leave or call the clinic.  Bring in all your medications or supplements to each appointment for review.   If we ordered any tests today, you will be notified via telephone of any abnormalities. If everything is normal you will get a letter in the mail.   If you have any questions or concerns, please do not hesitate to call the office at [phone]. You can also message me directly via MyChart.   Sincerely,  Smitty Cords, MD

## 2018-03-28 NOTE — Progress Notes (Signed)
Subjective:    Patient ID: Meredith Maynard , female   DOB: 07-31-1966 , 52 y.o..   MRN: [id number]  HPI  Meredith Maynard is  52 yo F with PMH of T2DM, HTN, multiple MSK issues, anxiety and Anemia here for here for   *Spanish video interpreter used for this visit**  1. Neck pain/left arm pain follow up: Patient notes that her neck pain and arm pain have improved since she has been taking ibuprofen and gabapentin.  She notes that she does not usually need the full 800 mg dose of ibuprofen she can typically take just 400 mg and be okay.  She has been taking 200 mg of gabapentin twice daily and this has been helping her.  She wants to know why she did develop neck pain and how she can prevent in the future.  She wants to know how to sleep at night in order to avoid her neck pain from getting worse.    Past Medical History: Patient Active Problem List   Diagnosis Date Noted  . Osteoarthritis of spine with radiculopathy, cervical region 03/06/2018  . Menorrhagia with regular cycle 05/25/2017  . Tension headache 04/26/2017  . Anemia 10/27/2016  . Pelvic pain 09/05/2016  . BPPV (benign paroxysmal positional vertigo) 02/02/2016  . Recurrent incisional hernia with incarceration 01/22/2016  . Recurrent ventral incisional hernia with obstruction 01/04/2016  . Cough 12/17/2015  . Eye pain 02/20/2015  . Onychomycosis 02/20/2015  . Unspecified constipation 05/26/2014  . Abdominal pain, epigastric 11/28/2013  . Musculoskeletal pain 08/14/2013  . Health care maintenance 04/25/2013  . Trigger finger, acquired 04/25/2013  . Fatigue 04/24/2013  . Status post tooth extraction 11/21/2012  . Plantar fasciitis of right foot 07/31/2012  . Right leg pain 07/08/2012  . LUQ abdominal pain 04/30/2012  . Abdominal pain 04/27/2012  . Multiple joint pain 04/11/2012  . Chest pain 12/14/2011  . Shoulder mass 12/08/2011  . Irregular periods/menstrual cycles 09/28/2011  . Leg length  discrepancy 09/27/2011  . Pes planus 09/27/2011  . Back pain 08/31/2011  . 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 Umbilical hernia 41/96/2229  . Sciatica of right side 04/18/2011  . GERD 04/08/2009  . Hyperlipidemia 03/20/2009  . UNSPECIFIED HEARING LOSS 12/31/2008  . Diabetes mellitus type 2 in obese ([place]) 01/17/2007  . DIZZINESS, CHRONIC 01/17/2007  . OBESITY, NOS 12/28/2006  . Iron deficiency anemia due to chronic blood loss 12/28/2006  . ANXIETY 12/28/2006  . HYPERTENSION, BENIGN SYSTEMIC 12/28/2006    Social Hx:  reports that she has never smoked. She has never used smokeless tobacco.   Objective:   BP 122/66   Pulse 96   Temp 98.6 F (37 C) (Oral)   Ht 5\' 5"  (1.651 m)   Wt 245 lb 3.2 oz (111.2 kg)   LMP 03/23/2018 (Exact Date)   SpO2 97%   BMI 40.80 kg/m  Physical Exam  Gen: NAD, alert, cooperative with exam, well-appearing Psych: good insight, normal mood and affect  Assessment & Plan:  Osteoarthritis of spine with radiculopathy, cervical region Improvement in pain with ibuprofen and gabapentin.  She has increased her gabapentin 200 mg twice daily, this increase is okay to continue with.  No red flag symptoms. - Continue gabapentin 200 mg twice daily for radiculopathy pain -Continue ibuprofen, can continue decreased dose of 400 mg twice daily as needed -Patient mostly wanted to discuss her neck pain today and had several questions about it which took up the entire visit time. Asked her  to please return for a diabetic visit in the next few weeks  Meds ordered this encounter  Medications  . gabapentin (NEURONTIN) 100 MG capsule    Sig: Take 2 capsules (200 mg total) by mouth 2 (two) times daily.    Dispense:  120 capsule    Refill:  3    Smitty Cords, MD [place], PGY-3

## 2018-03-29 ENCOUNTER — Encounter: Payer: Self-pay | Admitting: Family Medicine

## 2018-03-29 ENCOUNTER — Other Ambulatory Visit: Payer: Self-pay

## 2018-03-29 ENCOUNTER — Ambulatory Visit: Payer: Self-pay | Admitting: Family Medicine

## 2018-03-29 DIAGNOSIS — M4722 Other spondylosis with radiculopathy, cervical region: Secondary | ICD-10-CM

## 2018-03-29 MED ORDER — GABAPENTIN 100 MG PO CAPS: 200.0000 mg | ORAL_CAPSULE | Freq: Two times a day (BID) | ORAL | 3 refills | Status: DC

## 2018-03-29 NOTE — Patient Instructions (Signed)
Thank you for coming in today, it was so nice to see you! Today we talked about:    Neck pain/arthritis: I am glad that you are feeling a bit better.  Continue the ibuprofen as you have been.  You can take gabapentin 200 mg in the morning in the evening.  I fix this prescription for you.  If you would like to see a spine specialist we do recommend [facility], I am not sure how much this would cost if you are self-pay.  Please follow up in 2 weeks to see how your pain is and to discuss diabetes. You can schedule this appointment at the front desk before you leave or call the clinic.  Bring in all your medications or supplements to each appointment for review.   If we ordered any tests today, you will be notified via telephone of any abnormalities. If everything is normal you will get a letter in the mail.   If you have any questions or concerns, please do not hesitate to call the office at [phone]. You can also message me directly via MyChart.   Sincerely,  Smitty Cords, MD

## 2018-04-03 NOTE — Assessment & Plan Note (Addendum)
Improvement in pain with ibuprofen and gabapentin.  She has increased her gabapentin 200 mg twice daily, this increase is okay to continue with.  No red flag symptoms. - Continue gabapentin 200 mg twice daily for radiculopathy pain -Continue ibuprofen, can continue decreased dose of 400 mg twice daily as needed -Patient mostly wanted to discuss her neck pain today and had several questions about it which took up the entire visit time. Asked her to please return for a diabetic visit in the next few weeks

## 2018-05-23 ENCOUNTER — Ambulatory Visit (INDEPENDENT_AMBULATORY_CARE_PROVIDER_SITE_OTHER): Payer: Self-pay | Admitting: Family Medicine

## 2018-05-23 ENCOUNTER — Encounter: Payer: Self-pay | Admitting: Family Medicine

## 2018-05-23 ENCOUNTER — Other Ambulatory Visit: Payer: Self-pay

## 2018-05-23 VITALS — BP 114/64 | HR 89 | Temp 99.2°F | Ht 65.0 in | Wt 239.0 lb

## 2018-05-23 DIAGNOSIS — E1169 Type 2 diabetes mellitus with other specified complication: Secondary | ICD-10-CM

## 2018-05-23 DIAGNOSIS — E669 Obesity, unspecified: Secondary | ICD-10-CM

## 2018-05-23 DIAGNOSIS — R399 Unspecified symptoms and signs involving the genitourinary system: Secondary | ICD-10-CM | POA: Insufficient documentation

## 2018-05-23 LAB — POCT URINALYSIS DIP (MANUAL ENTRY)
BILIRUBIN UA: NEGATIVE mg/dL
Bilirubin, UA: NEGATIVE
Blood, UA: NEGATIVE
Leukocytes, UA: NEGATIVE
Nitrite, UA: NEGATIVE
Protein Ur, POC: NEGATIVE mg/dL
SPEC GRAV UA: 1.015 (ref 1.010–1.025)
Urobilinogen, UA: 0.2 E.U./dL
pH, UA: 7 (ref 5.0–8.0)

## 2018-05-23 NOTE — Assessment & Plan Note (Signed)
Patient presents with dysuria, increase frequency and strong smelling urine concerning for UTI. UA did not show any signs of infection with normal leukocytes and nitrites. However, patient had significant glucosuria (glucose >500) . Based on UA, increase frequency likely secondary to osmotic phenomenon, and odor could be secondary to ketonuria though not reported on UA. Discuss need to improve glycemic control and increase hydration.

## 2018-05-23 NOTE — Patient Instructions (Addendum)
It was great seeing you today! We have addressed the following issues today  1. Please increase your glipizide to 10 mg two times a day. 2. We made you an appointment to follow up with your regular doctor. 3. Continue to take the metformin the same way.  If we did any lab work today, and the results require attention, either me or my nurse will get in touch with you. If everything is normal, you will get a letter in mail and a message via . If you don't hear from [place] in two weeks, please give [place] a call. Otherwise, we look forward to seeing you again at your next visit. If you have any questions or concerns before then, please call the clinic at [phone].  Please bring all your medications to every doctors visit  Sign up for My Chart to have easy access to your labs results, and communication with your Primary care physician. Please ask Front Desk for some assistance.   Please check-out at the front desk before leaving the clinic.    Take Care,   Dr. Andy Gauss

## 2018-05-23 NOTE — Progress Notes (Signed)
Subjective:    Patient ID: Meredith Maynard, female    DOB: Nov 09, 1965, 52 y.o.   MRN: [id number]   CC: Burning with urination  HPI: Patient is a 52 yo female who presents today complaining of burning with urination for the past few days. Patient reports strong smelling urine. Patient had similar presentation back in January 2019 and and found to have glucosuria. Patient was started on glipizide in addition to metformin. Patient reports for the past month she has not taken her glipizide. She reports medication was refilled but she did not have a chance to pick it up. Patient also reports fatigue but denies any nausea, vomiting, abdominal or flank pain.  Smoking status reviewed   ROS: all other systems were reviewed and are negative other than in the HPI   Past Medical History:  Diagnosis Date  . Anxiety   . Fatty liver   . Gastritis   . GERD (gastroesophageal reflux disease)   . HLD (hyperlipidemia)   . Hypertension   . Meniere syndrome   . Panic attacks   . Somatic complaints, multiple   . T2DM (type 2 diabetes mellitus) ([place])     Past Surgical History:  Procedure Laterality Date  . Abdominal U/S  2010   Other than fatty liver, normal  . CESAREAN SECTION  2000,2004,2005   x3  . Exercise Treadmill Test  2010   Normal, poor exercise tolerance  . INCISIONAL HERNIA REPAIR N/A 01/04/2016   Procedure: RECURRENT INCISIONAL HERNIA;  Surgeon: Fanny Skates, MD;  Location: [facility];  Service: General;  Laterality: N/A;  . INSERTION OF MESH N/A 01/04/2016   Procedure: INSERTION OF MESH;  Surgeon: Fanny Skates, MD;  Location: [facility];  Service: General;  Laterality: N/A;  PHASIX MESH  . LAPAROTOMY N/A 01/04/2016   Procedure: EXPLORATORY LAPAROTOMY AND LYSIS OF ADHESIONS ;  Surgeon: Fanny Skates, MD;  Location: [facility];  Service: General;  Laterality: N/A;  . SMALL INTESTINE SURGERY  2007   Dr. Hulen Skains  . TUBAL LIGATION    . UMBILICAL HERNIA REPAIR  2007   Dr. Hulen Skains    Past  medical history, surgical, family, and social history reviewed and updated in the EMR as appropriate.  Objective:  BP 114/64   Pulse 89   Temp 99.2 F (37.3 C) (Oral)   Ht 5\' 5"  (1.651 m)   Wt 239 lb (108.4 kg)   LMP 04/23/2018 (Approximate)   SpO2 98%   BMI 39.77 kg/m   Vitals and nursing note reviewed  General: NAD, pleasant, able to participate in exam Cardiac: RRR, normal heart sounds, no murmurs. 2+ radial and PT pulses bilaterally Respiratory: CTAB, normal effort, No wheezes, rales or rhonchi Abdomen: soft, nontender, nondistended, no hepatic or splenomegaly, +BS Extremities: no edema or cyanosis. WWP. Skin: warm and dry, no rashes noted Neuro: alert and oriented x4, no focal deficits Psych: Normal affect and mood   Assessment & Plan:    UTI symptoms Patient presents with dysuria, increase frequency and strong smelling urine concerning for UTI. UA did not show any signs of infection with normal leukocytes and nitrites. However, patient had significant glucosuria (glucose >500) . Based on UA, increase frequency likely secondary to osmotic phenomenon, and odor could be secondary to ketonuria though not reported on UA. Discuss need to improve glycemic control and increase hydration.  Diabetes mellitus type 2 in obese Patient present today with glucosuria and polyuria. Last A1c was 9.7 in June 2019 down from (11.2). Patient as  then taking 5 mg glipizide bid in addition to metformin 2000 mg daily. Given symptoms today, will increase her glipizide to 10 mg bid. She will continue with current metformin dose. Appointment was made to follow up with PCP in two weeks for medications optimization. Given age, patient will need tighter control and current regimen could be adjusted. --CMP, CBC, Lipid panel order today.     Marjie Skiff, MD [facility] PGY-3

## 2018-05-23 NOTE — Assessment & Plan Note (Addendum)
Patient present today with glucosuria and polyuria. Last A1c was 9.7 in June 2019 down from (11.2). Patient as then taking 5 mg glipizide bid in addition to metformin 2000 mg daily. Given symptoms today, will increase her glipizide to 10 mg bid. She will continue with current metformin dose. Appointment was made to follow up with PCP in two weeks for medications optimization. Given age, patient will need tighter control and current regimen could be adjusted. --CMP, CBC, Lipid panel order today.

## 2018-05-24 LAB — CBC WITH DIFFERENTIAL/PLATELET
BASOS: 1 %
Basophils Absolute: 0.1 10*3/uL (ref 0.0–0.2)
EOS (ABSOLUTE): 0.1 10*3/uL (ref 0.0–0.4)
EOS: 2 %
Hematocrit: 32.8 % — ABNORMAL LOW (ref 34.0–46.6)
Hemoglobin: 8.9 g/dL — ABNORMAL LOW (ref 11.1–15.9)
IMMATURE GRANS (ABS): 0 10*3/uL (ref 0.0–0.1)
IMMATURE GRANULOCYTES: 0 %
LYMPHS: 22 %
Lymphocytes Absolute: 1.1 10*3/uL (ref 0.7–3.1)
MCH: 18.9 pg — ABNORMAL LOW (ref 26.6–33.0)
MCHC: 27.1 g/dL — ABNORMAL LOW (ref 31.5–35.7)
MCV: 70 fL — AB (ref 79–97)
Monocytes Absolute: 0.4 10*3/uL (ref 0.1–0.9)
Monocytes: 8 %
NEUTROS PCT: 67 %
Neutrophils Absolute: 3.4 10*3/uL (ref 1.4–7.0)
PLATELETS: 185 10*3/uL (ref 150–450)
RBC: 4.7 x10E6/uL (ref 3.77–5.28)
RDW: 17.9 % — AB (ref 12.3–15.4)
WBC: 5.1 10*3/uL (ref 3.4–10.8)

## 2018-05-24 LAB — BASIC METABOLIC PANEL
BUN/Creatinine Ratio: 16 (ref 9–23)
BUN: 9 mg/dL (ref 6–24)
CHLORIDE: 99 mmol/L (ref 96–106)
CO2: 26 mmol/L (ref 20–29)
Calcium: 9.2 mg/dL (ref 8.7–10.2)
Creatinine, Ser: 0.57 mg/dL (ref 0.57–1.00)
GFR calc non Af Amer: 108 mL/min/{1.73_m2} (ref >59)
GFR, EST AFRICAN AMERICAN: 124 mL/min/{1.73_m2} (ref >59)
Glucose: 360 mg/dL — ABNORMAL HIGH (ref 65–99)
POTASSIUM: 4.5 mmol/L (ref 3.5–5.2)
Sodium: 137 mmol/L (ref 134–144)

## 2018-06-05 ENCOUNTER — Ambulatory Visit (INDEPENDENT_AMBULATORY_CARE_PROVIDER_SITE_OTHER): Payer: Self-pay | Admitting: Family Medicine

## 2018-06-05 ENCOUNTER — Encounter: Payer: Self-pay | Admitting: Family Medicine

## 2018-06-05 ENCOUNTER — Other Ambulatory Visit: Payer: Self-pay

## 2018-06-05 VITALS — BP 112/68 | HR 90 | Temp 98.4°F | Wt 239.2 lb

## 2018-06-05 DIAGNOSIS — E669 Obesity, unspecified: Secondary | ICD-10-CM

## 2018-06-05 DIAGNOSIS — E1169 Type 2 diabetes mellitus with other specified complication: Secondary | ICD-10-CM

## 2018-06-05 DIAGNOSIS — Z1239 Encounter for other screening for malignant neoplasm of breast: Secondary | ICD-10-CM

## 2018-06-05 DIAGNOSIS — B351 Tinea unguium: Secondary | ICD-10-CM

## 2018-06-05 DIAGNOSIS — Z1231 Encounter for screening mammogram for malignant neoplasm of breast: Secondary | ICD-10-CM

## 2018-06-05 DIAGNOSIS — I10 Essential (primary) hypertension: Secondary | ICD-10-CM

## 2018-06-05 LAB — POCT GLYCOSYLATED HEMOGLOBIN (HGB A1C): HBA1C, POC (CONTROLLED DIABETIC RANGE): 9.6 % — AB (ref 0.0–7.0)

## 2018-06-05 MED ORDER — CEPHALEXIN 500 MG PO CAPS: 500.0000 mg | ORAL_CAPSULE | Freq: Four times a day (QID) | ORAL | 0 refills | Status: AC

## 2018-06-05 MED ORDER — TERBINAFINE HCL 250 MG PO TABS: 250.0000 mg | ORAL_TABLET | Freq: Every day | ORAL | 0 refills | Status: AC

## 2018-06-05 NOTE — Patient Instructions (Signed)
It was great to see you!  Our plans for today:  - Continue to take the glipizide 10mg  twice daily - Continue to work on cutting out carbs. - We are prescribing a medication for your fungal infection. - Follow up in one month for your diabetes.  We are checking some labs today, we will call you or send you a letter if they are abnormal.   Take care and seek immediate care sooner if you develop any concerns.   Dr. [facility]  Here is an example of what a healthy plate looks like:    ? Make half your plate fruits and vegetables.     ? Focus on whole fruits.     ? Vary your veggies.  ? Make half your grains whole grains. -     ? Look for the word "whole" at the beginning of the ingredients list    ? Some whole-grain ingredients include whole oats, whole-wheat flour,        whole-grain corn, whole-grain brown rice, and whole rye.  ? Move to low-fat and fat-free milk or yogurt.  ? Vary your protein routine. - Meat, fish, poultry (chicken, [place]), eggs, beans (kidney, pinto), dairy.  ? Drink and eat less sodium, saturated fat, and added sugars.    Diet Recommendations for Diabetes   1. Eat at least 3 meals and 1-2 snacks per day. Never go more than 4-5 hours while awake without eating. Eat breakfast within the first hour of getting up.   2. Limit starchy foods to TWO per meal and ONE per snack. ONE portion of a starchy  food is equal to the following:   - ONE slice of bread (or its equivalent, such as half of a hamburger bun).   - 1/2 cup of a "scoopable" starchy food such as potatoes or rice.   - 15 grams of Total Carbohydrate as shown on food label.  3. Include at every meal: a protein food, a carb food, and vegetables and/or fruit.   - Obtain twice the volume of vegetables as protein or carbohydrate foods for both lunch and dinner.   - Fresh or frozen vegetables are best.   - Keep frozen vegetables on hand for a quick vegetable serving.       Starchy  (carb) foods: Bread, rice, pasta, potatoes, corn, cereal, grits, crackers, bagels, muffins, all baked goods.  (Fruits, milk, and yogurt also have carbohydrate, but most of these foods will not spike your blood sugar as most starchy foods will.)  A few fruits do cause high blood sugars; use small portions of bananas (limit to 1/2 at a time), grapes, watermelon, oranges, and most tropical fruits.    Protein foods: Meat, fish, poultry, eggs, dairy foods, and beans such as pinto and kidney beans (beans also provide carbohydrate).

## 2018-06-05 NOTE — Progress Notes (Signed)
Subjective:   Patient ID: [place]    DOB: 1966/03/11, 52 y.o. female   MRN: [id number]  Michala Deblanc is a 52 y.o. female with a history of HTN, GERD, DM2, BPPV, OA here for   Diabetes, Type 2 - Last A1c 9.7 02/2018 - Medications: glipizide 10mg  BID, metformin 1000mg  BID - Compliance: reports good - Checking BG at home: yes (fasting 130. 2 hours after meal 250-290s, but is unsure of lowest and highest numbers) - Diet: changed diet since last appointment.  - has cut down on added sugar.  - Breakfast: PB&J toast, yogurt, [place] sandwich  - Lunch: tuna, salads, corn. Avoiding rice and tortillas (one or two at the most)  - Dinner - toast with PB  - often has 2 snacks in between meals. Often has 2 meals per day. - salads with meat (chicken) - Exercise: walks, cleaning (occupation). - Eye exam: due - Foot exam: due - Denies symptoms of hypoglycemia, polyuria, polydipsia, numbness extremities, foot ulcers/trauma - Previously was seen 2 weeks ago for increased urination which she states has improved since last visit.  Hypertension: - Medications: lisinopril 20mg  - Compliance: good - Checking BP at home: no - Denies any SOB, CP, vision changes, LE edema, medication SEs, or symptoms of hypotension - Diet: see above - Exercise: see above  Fungal infection Endorses brittle R thumb nail for the past few weeks with associated pain and redness. States current symptoms are similar to previous fungal infection. Denies fevers, other rashes, N/V.  Review of Systems:  Per HPI.  [place], medications and smoking status reviewed.  Objective:   BP 112/68   Pulse 90   Temp 98.4 F (36.9 C) (Oral)   Wt 239 lb 3.2 oz (108.5 kg)   SpO2 98%   BMI 39.80 kg/m  Vitals and nursing note reviewed.  General: morbidly obese female, in no acute distress with non-toxic appearance Neck: supple, non-tender without lymphadenopathy CV: regular rate and rhythm without murmurs,  rubs, or gallops, no lower extremity edema Lungs: clear to auscultation bilaterally with normal work of breathing Skin: warm, dry, no rashes. Brittle tip of R thumb nail with small amount of white discharge in the nail bed. Surrounding erythema noted to the hyponychium. (see picture) MSK: ROM grossly intact, strength intact, gait normal Neuro: Alert and oriented, speech normal      Assessment & Plan:   HYPERTENSION, BENIGN SYSTEMIC BP 112/68 today, well controlled on current regimen. Continue current therapy.  Diabetes mellitus type 2 in obese A1c today 9.6. Patient reports changing her diet and increasing her exercise since seen last 2 weeks ago. Reports urinary frequency and dysuria are improved. Recently increased glipizide dose at last visit. As patient reports decreased symptoms of hyperglycemia, will continue current regimen and f/u in one month with recorded CBG. Foot exam performed today wnl.  Onychomycosis Exam consistent with fungal infection with possible bacterial superinfection given discharge under nail bed and surrounding erythema. Will obtain LFTs and prescribe lamisil as well as Keflex for bacterial infection. RTC if no better or worsened. Red flags discussed.  Orders Placed This Encounter  Procedures  . MM DIGITAL SCREENING BILATERAL    Standing Status:   Future    Standing Expiration Date:   06/08/2019    Order Specific Question:   Reason for Exam (SYMPTOM  OR DIAGNOSIS REQUIRED)    Answer:   screening for breast cancer    Order Specific Question:   Is the patient pregnant?    Answer:  No    Order Specific Question:   Preferred imaging location?    Answer:   [facility]  . Comprehensive metabolic panel  . Ambulatory referral to Ophthalmology    Referral Priority:   Routine    Referral Type:   Consultation    Referral Reason:   Specialty Services Required    Requested Specialty:   Ophthalmology    Number of Visits Requested:   1  . HgB A1c   Meds ordered  this encounter  Medications  . terbinafine (LAMISIL) 250 MG tablet    Sig: Take 1 tablet (250 mg total) by mouth daily.    Dispense:  42 tablet    Refill:  0  . cephALEXin (KEFLEX) 500 MG capsule    Sig: Take 1 capsule (500 mg total) by mouth 4 (four) times daily for 5 days.    Dispense:  20 capsule    Refill:  0    Rory Percy, DO PGY-2, [facility] 06/07/2018 5:01 PM

## 2018-06-06 LAB — COMPREHENSIVE METABOLIC PANEL
ALBUMIN: 3.8 g/dL (ref 3.5–5.5)
ALK PHOS: 114 IU/L (ref 39–117)
ALT: 70 IU/L — ABNORMAL HIGH (ref 0–32)
AST: 54 IU/L — ABNORMAL HIGH (ref 0–40)
Albumin/Globulin Ratio: 1.5 (ref 1.2–2.2)
BUN / CREAT RATIO: 18 (ref 9–23)
BUN: 11 mg/dL (ref 6–24)
Bilirubin Total: <0.2 mg/dL (ref 0.0–1.2)
CO2: 20 mmol/L (ref 20–29)
CREATININE: 0.61 mg/dL (ref 0.57–1.00)
Calcium: 9.5 mg/dL (ref 8.7–10.2)
Chloride: 102 mmol/L (ref 96–106)
GFR calc non Af Amer: 105 mL/min/{1.73_m2} (ref >59)
GFR, EST AFRICAN AMERICAN: 121 mL/min/{1.73_m2} (ref >59)
GLOBULIN, TOTAL: 2.6 g/dL (ref 1.5–4.5)
Glucose: 201 mg/dL — ABNORMAL HIGH (ref 65–99)
Potassium: 4.3 mmol/L (ref 3.5–5.2)
SODIUM: 142 mmol/L (ref 134–144)
Total Protein: 6.4 g/dL (ref 6.0–8.5)

## 2018-06-07 ENCOUNTER — Encounter: Payer: Self-pay | Admitting: Family Medicine

## 2018-06-07 ENCOUNTER — Other Ambulatory Visit: Payer: Self-pay | Admitting: Family Medicine

## 2018-06-07 DIAGNOSIS — B351 Tinea unguium: Secondary | ICD-10-CM

## 2018-06-07 NOTE — Assessment & Plan Note (Signed)
BP 112/68 today, well controlled on current regimen. Continue current therapy.

## 2018-06-07 NOTE — Assessment & Plan Note (Signed)
A1c today 9.6. Patient reports changing her diet and increasing her exercise since seen last 2 weeks ago. Reports urinary frequency and dysuria are improved. Recently increased glipizide dose at last visit. As patient reports decreased symptoms of hyperglycemia, will continue current regimen and f/u in one month with recorded CBG. Foot exam performed today wnl.

## 2018-06-07 NOTE — Assessment & Plan Note (Signed)
Exam consistent with fungal infection with possible bacterial superinfection given discharge under nail bed and surrounding erythema. Will obtain LFTs and prescribe lamisil as well as Keflex for bacterial infection. RTC if no better or worsened. Red flags discussed.

## 2018-06-12 ENCOUNTER — Telehealth: Payer: Self-pay | Admitting: *Deleted

## 2018-06-12 NOTE — Telephone Encounter (Signed)
-----   Message from Rory Percy, DO sent at 06/07/2018  3:29 PM EDT ----- Attempted to call patient through interpreter services, voicemail not set up. Her liver enzymes are slightly elevated but we will recheck them in 6 weeks to make sure they are not elevating further on her antifungal medication. Will be happy to answer any questions she may have.

## 2018-06-12 NOTE — Telephone Encounter (Signed)
[place] interpreter used [place] ID# [phone].  Tried calling patient on [phone] and there was no answer or option for voicemail.  Tried calling the other phone number [phone] and this voicemail has not been set either.  Will inform MD and we may have to send a letter. Jazmin Hartsell,CMA

## 2018-08-13 ENCOUNTER — Other Ambulatory Visit: Payer: Self-pay

## 2018-08-13 ENCOUNTER — Ambulatory Visit (INDEPENDENT_AMBULATORY_CARE_PROVIDER_SITE_OTHER): Payer: Self-pay | Admitting: Family Medicine

## 2018-08-13 VITALS — BP 118/68 | HR 66 | Temp 98.1°F | Ht 65.0 in | Wt 234.0 lb

## 2018-08-13 DIAGNOSIS — B351 Tinea unguium: Secondary | ICD-10-CM

## 2018-08-13 DIAGNOSIS — E669 Obesity, unspecified: Secondary | ICD-10-CM

## 2018-08-13 DIAGNOSIS — L089 Local infection of the skin and subcutaneous tissue, unspecified: Secondary | ICD-10-CM

## 2018-08-13 DIAGNOSIS — E1169 Type 2 diabetes mellitus with other specified complication: Secondary | ICD-10-CM

## 2018-08-13 HISTORY — DX: Local infection of the skin and subcutaneous tissue, unspecified: L08.9

## 2018-08-13 MED ORDER — TRIAMCINOLONE ACETONIDE 0.1 % EX CREA: 1.0000 "application " | TOPICAL_CREAM | Freq: Two times a day (BID) | CUTANEOUS | 1 refills | Status: DC

## 2018-08-13 MED ORDER — TERBINAFINE HCL 250 MG PO TABS: 250.0000 mg | ORAL_TABLET | Freq: Every day | ORAL | 2 refills | Status: DC

## 2018-08-13 NOTE — Assessment & Plan Note (Addendum)
Patient's diabetes continues to be well controlled according to her reported blood sugars.  We will continue her current medication regimen and check her A1c in 1 month.

## 2018-08-13 NOTE — Progress Notes (Signed)
Subjective:    [place] - 52 y.o. female MRN [id number]  Date of birth: 03/15/1966  HPI  Meredith Maynard is here for follow up of diabetes.  Also like to discuss her right thumb pain and her toenail changes.  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 -No hypoglycemic symptoms  Right thumb pain -It was prescribed Keflex and Lamisil at her previous appointment, but she did not finish the Lamisil because she moved to a new house -Did finish the Keflex, but notes that her thumb is getting worse -Describes her pain as "stabbing, burning, just below her right thumbnail, and pain radiates down her right thumb and sometimes involves her whole right hand -Notes no drainage, but says skin is very thin -Cleans with chemicals, and even though she wears gloves, she says the chemicals still irritate her finger  Toenail changes  Patient notes toenail thickening, which is been going on for several months -Also says that she will often bump her feet into things, and she wonders if her toenails are dysmorphic due to trauma -Has had to have her toenails removed in the past before -Has not gotten her LFTs checked since she did not finish her Lamisil     Health Maintenance:  - Health Maintenance Due  Topic Date Due  . OPHTHALMOLOGY EXAM  10/21/1976  . HIV Screening  10/21/1981  . MAMMOGRAM  10/21/2016  . COLONOSCOPY  10/21/2016    -  reports that she has never smoked. She has never used smokeless tobacco. - Review of Systems: Per HPI. - Past Medical History: Patient Active Problem List   Diagnosis Date Noted  . Skin inflammation 08/13/2018  . UTI symptoms 05/23/2018  . Osteoarthritis of spine with radiculopathy, cervical region 03/06/2018  . Menorrhagia with  regular cycle 05/25/2017  . Tension headache 04/26/2017  . Anemia 10/27/2016  . Pelvic pain 09/05/2016  . BPPV (benign paroxysmal positional vertigo) 02/02/2016  . Recurrent incisional hernia with incarceration 01/22/2016  . Recurrent ventral incisional hernia with obstruction 01/04/2016  . Cough 12/17/2015  . Eye pain 02/20/2015  . Onychomycosis 02/20/2015  . Unspecified constipation 05/26/2014  . Abdominal pain, epigastric 11/28/2013  . Musculoskeletal pain 08/14/2013  . Health care maintenance 04/25/2013  . Trigger finger, acquired 04/25/2013  . Fatigue 04/24/2013  . Status post tooth extraction 11/21/2012  . Plantar fasciitis of right foot 07/31/2012  . Right leg pain 07/08/2012  . LUQ abdominal pain 04/30/2012  . Abdominal pain 04/27/2012  . Multiple joint pain 04/11/2012  . Chest pain 12/14/2011  . Shoulder mass 12/08/2011  . Irregular periods/menstrual cycles 09/28/2011  . Leg length discrepancy 09/27/2011  . Pes planus 09/27/2011  . Back pain 08/31/2011  . 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 Umbilical hernia 09/32/6712  . Sciatica of right side 04/18/2011  . GERD 04/08/2009  . Hyperlipidemia 03/20/2009  . UNSPECIFIED HEARING LOSS 12/31/2008  . Diabetes mellitus type 2 in obese ([place]) 01/17/2007  . DIZZINESS, CHRONIC 01/17/2007  . OBESITY, NOS 12/28/2006  . Iron deficiency anemia due to chronic blood loss 12/28/2006  . ANXIETY 12/28/2006  . HYPERTENSION, BENIGN SYSTEMIC 12/28/2006   - Medications: reviewed and updated   Objective:   Physical Exam BP 118/68   Pulse 66   Temp 98.1 F (36.7 C) (Oral)   Ht 5\' 5"  (  1.651 m)   Wt 234 lb (106.1 kg)   LMP 08/12/2018 (Exact Date)   SpO2 99%   BMI 38.94 kg/m  Gen: NAD, alert, cooperative with exam, well-appearing CV: RRR, good S1/S2, no murmur Resp: CTABL, no wheezes, non-labored Extremities: Slightly erythematous, non-pustular appearing, swollen area at the tip of patient's right thumb with some skin peeling.   Tender to palpation.  Bilateral toenails thickened and dark in color.  No other signs of deformities of feet. Diabetic Foot Exam - Simple   Simple Foot Form Diabetic Foot exam was performed with the following findings:  Yes 08/13/2018  1:58 PM  Visual Inspection No deformities, no ulcerations, no other skin breakdown bilaterally:  Yes Sensation Testing Intact to touch and monofilament testing bilaterally:  Yes Pulse Check Posterior Tibialis and Dorsalis pulse intact bilaterally:  Yes Comments           Assessment & Plan:   Diabetes mellitus type 2 in obese Patient's diabetes continues to be well controlled according to her reported blood sugars.  We will continue her current medication regimen and check her A1c in 1 month.  Skin inflammation Due to the chronic nature of patient's symptoms and the lack of pus visualized under her skin, patient's thumb pain may be more inflammatory rather than infectious in nature.  Will prescribe topical steroid cream and gave patient a protective metal splint.  Would like to follow-up on her thumb in about 2 weeks to 1 month.  Onychomycosis It appears that she has a fungal infection of her toenails, and previous treatment was not very effective because she did not take it for very long.  Will restart terbinafine prescription and have patient get her LFTs checked in about 1 month.  Patient was advised that she may need to be on this therapy for several months, since onychomycosis of the toenails is often difficult to eradicate.    Maia Breslow, M.D. 08/14/2018, 10:22 AM PGY-2, [facility]

## 2018-08-13 NOTE — Patient Instructions (Addendum)
It was nice meeting you today Meredith Maynard!  I am sending in a prescription for terbinafine for your toe nail fungal infection and triamcinolone cream for your thumb.  Please take one pill daily and apply the cream two times daily.  You have refills of each of these medications.  Keep the splint on your thumb to protect it.  Please return in 1-2 weeks so that we can check your thumb.  We will also need to get a blood test in the next month to check on your liver enzymes due to the terbinafine.  If you have any questions or concerns, please feel free to call the clinic.   Be well,  Dr. Shan Levans

## 2018-08-14 NOTE — Assessment & Plan Note (Signed)
It appears that she has a fungal infection of her toenails, and previous treatment was not very effective because she did not take it for very long.  Will restart terbinafine prescription and have patient get her LFTs checked in about 1 month.  Patient was advised that she may need to be on this therapy for several months, since onychomycosis of the toenails is often difficult to eradicate.

## 2018-08-14 NOTE — Assessment & Plan Note (Signed)
Due to the chronic nature of patient's symptoms and the lack of pus visualized under her skin, patient's thumb pain may be more inflammatory rather than infectious in nature.  Will prescribe topical steroid cream and gave patient a protective metal splint.  Would like to follow-up on her thumb in about 2 weeks to 1 month.

## 2018-08-16 ENCOUNTER — Ambulatory Visit (INDEPENDENT_AMBULATORY_CARE_PROVIDER_SITE_OTHER): Payer: Self-pay | Admitting: Family Medicine

## 2018-08-16 DIAGNOSIS — L089 Local infection of the skin and subcutaneous tissue, unspecified: Secondary | ICD-10-CM

## 2018-08-16 NOTE — Patient Instructions (Signed)
It was a pleasure seeing you today.   Today we discussed your thumb pain.   For your thumb: please continue the steroid cream. If continued pain you can use ibuprofen as needed. Please follow up in 2-3 weeks.   Please follow up in 2-3 weeks or sooner if symptoms persist or worsen. Please call the clinic immediately if you have any concerns.   Our clinic's number is [phone]. Please call with questions or concerns.   Please go to the emergency room if you start developing high fevers.   Thank you,  Caroline More, DO

## 2018-08-16 NOTE — Assessment & Plan Note (Signed)
Area appears to be improving compared to previous pictures in patient's chart.  Area continues to lack any drainage reported by patient or visualized pustular drainage.  Unlikely infectious in nature as patient has already had a trial of Keflex and continues to not have any systemic symptoms.  But patient has only started topical steroid cream a few days ago so likely that she has not been using it long enough.  Encourage patient to continue to use topical steroid cream as well as protective splint.  No systemic symptoms noted.  Patient also continues to use terbinafine for her foot fungal infection.  Strict return precautions given.  Advised patient to follow-up in 2 weeks, patient reports that she already has a scheduled follow-up appointment.

## 2018-08-16 NOTE — Progress Notes (Signed)
   Subjective:    Patient ID: Meredith Maynard, female    DOB: 1965-12-22, 52 y.o.   MRN: [id number]   CC: Right thumb pain  HPI: Right thumb pain Patient coming in today for follow-up of right thumb pain.  Patient initially seen on August 6 by her PCP who at that time prescribed Lamisil and Keflex for bacterial infection.  She and then came back on October 14 due to worsening symptoms.  At that time had stabbing and burning pain.  Patient was prescribed topical steroid cream and a protective metal splint at that time and advised to follow-up in 2 to 4 weeks.  Patient presenting today because she continues to have stabbing pain around her skin.  Patient only started medications a few days ago.  States that area initially was very inflamed but started ointment and this inflammation has decreased.  States that it looks like her skin has been "eating away".  Patient is worried because she has a history of diabetes and does not want a severe infection.  Denies any fevers or chills at this time.  States that when pain occurs it goes from the top of her thumb to the base of her thumb.   Objective:  BP 122/78   Pulse 69   Temp 97.9 F (36.6 C) (Oral)   Wt 235 lb (106.6 kg)   LMP 08/12/2018 (Exact Date)   SpO2 98%   BMI 39.11 kg/m  Vitals and nursing note reviewed  General: well nourished, in no acute distress HEENT: normocephalic, moist mucous membranes  Thumb:  Skin: warm and dry  Neuro: alert and oriented, no focal deficits   Assessment & Plan:    Skin inflammation Area appears to be improving compared to previous pictures in patient's chart.  Area continues to lack any drainage reported by patient or visualized pustular drainage.  Unlikely infectious in nature as patient has already had a trial of Keflex and continues to not have any systemic symptoms.  But patient has only started topical steroid cream a few days ago so likely that she has not been using it long enough.   Encourage patient to continue to use topical steroid cream as well as protective splint.  No systemic symptoms noted.  Patient also continues to use terbinafine for her foot fungal infection.  Strict return precautions given.  Advised patient to follow-up in 2 weeks, patient reports that she already has a scheduled follow-up appointment.    Return in about 2 weeks (around 08/30/2018).   Caroline More, DO, PGY-2

## 2018-08-27 ENCOUNTER — Encounter: Payer: Self-pay | Admitting: Family Medicine

## 2018-08-27 ENCOUNTER — Other Ambulatory Visit: Payer: Self-pay

## 2018-08-27 ENCOUNTER — Ambulatory Visit (INDEPENDENT_AMBULATORY_CARE_PROVIDER_SITE_OTHER): Payer: Self-pay | Admitting: Family Medicine

## 2018-08-27 VITALS — BP 108/68 | HR 72 | Temp 98.6°F | Ht 65.0 in | Wt 237.0 lb

## 2018-08-27 DIAGNOSIS — B351 Tinea unguium: Secondary | ICD-10-CM

## 2018-08-27 DIAGNOSIS — E669 Obesity, unspecified: Secondary | ICD-10-CM

## 2018-08-27 DIAGNOSIS — E1169 Type 2 diabetes mellitus with other specified complication: Secondary | ICD-10-CM

## 2018-08-27 DIAGNOSIS — L089 Local infection of the skin and subcutaneous tissue, unspecified: Secondary | ICD-10-CM

## 2018-08-27 LAB — POCT GLYCOSYLATED HEMOGLOBIN (HGB A1C): HbA1c, POC (controlled diabetic range): 8.3 % — AB (ref 0.0–7.0)

## 2018-08-27 NOTE — Assessment & Plan Note (Signed)
Improve A1c from 9.6 to 8.3 today.  Patient will discuss management with PCP at next office visit.

## 2018-08-27 NOTE — Assessment & Plan Note (Signed)
Patient is currently on terbinafine for onychomycosis.  She has been treated for more than 6 weeks and is to have LFTs checked today. --Order CMP --She will follow-up with PCP

## 2018-08-27 NOTE — Progress Notes (Signed)
Subjective:    Patient ID: Meredith Maynard, female    DOB: October 23, 1966, 52 y.o.   MRN: [id number]   CC: Follow-up for right thumb pain  HPI: Patient is a 52 year old female with past medical history significant for diabetes who presents today to follow-up on right thumb pain.  Patient reports that she has been using steroid cream as prescribed and has noticed an improvement.  She still reports some discomfort under the nail.  She also is concerned that she may have a deeply seeded infection around the nailbed where she has noted hyperpigmentation.  Patient denies any fever, chills drainage or significant pain in that area.  Patient has been through one course of Keflex.  Smoking status reviewed   ROS: all other systems were reviewed and are negative other than in the HPI   Past Medical History:  Diagnosis Date  . Anxiety   . Fatty liver   . Gastritis   . GERD (gastroesophageal reflux disease)   . HLD (hyperlipidemia)   . Hypertension   . Meniere syndrome   . Panic attacks   . Somatic complaints, multiple   . T2DM (type 2 diabetes mellitus) ([place])     Past Surgical History:  Procedure Laterality Date  . Abdominal U/S  2010   Other than fatty liver, normal  . CESAREAN SECTION  2000,2004,2005   x3  . Exercise Treadmill Test  2010   Normal, poor exercise tolerance  . INCISIONAL HERNIA REPAIR N/A 01/04/2016   Procedure: RECURRENT INCISIONAL HERNIA;  Surgeon: Fanny Skates, MD;  Location: [facility];  Service: General;  Laterality: N/A;  . INSERTION OF MESH N/A 01/04/2016   Procedure: INSERTION OF MESH;  Surgeon: Fanny Skates, MD;  Location: [place] [facility];  Service: General;  Laterality: N/A;  PHASIX MESH  . LAPAROTOMY N/A 01/04/2016   Procedure: EXPLORATORY LAPAROTOMY AND LYSIS OF ADHESIONS ;  Surgeon: Fanny Skates, MD;  Location: [facility];  Service: General;  Laterality: N/A;  . SMALL INTESTINE SURGERY  2007   Dr. Hulen Skains  . TUBAL LIGATION    . UMBILICAL HERNIA REPAIR  2007   Dr.  Hulen Skains    Past medical history, surgical, family, and social history reviewed and updated in the EMR as appropriate.  Objective:  BP 108/68   Pulse 72   Temp 98.6 F (37 C) (Oral)   Ht 5\' 5"  (1.651 m)   Wt 237 lb (107.5 kg)   LMP 08/12/2018 (Exact Date)   SpO2 97%   BMI 39.44 kg/m   Vitals and nursing note reviewed    General: NAD, pleasant, able to participate in exam Cardiac: RRR, normal heart sounds, no murmurs. 2+ radial and PT pulses bilaterally Respiratory: CTAB, normal effort, No wheezes, rales or rhonchi Abdomen: soft, nontender, nondistended, no hepatic or splenomegaly, +BS Extremities: Right thumb nail with mild hyperpigmentation at the tip of the nail skin lower nailbeds mildly hyperpigmented with no fluctuance or significant tenderness to palpation.  Skin tip of her thumb looks well-healed with evidence of mild hypopigmentation.  No fluctuance, erythema or drainage. Skin: warm and dry, no rashes noted Neuro: alert and oriented x4, no focal deficits Psych: Normal affect and mood   Assessment & Plan:   Onychomycosis Patient is currently on terbinafine for onychomycosis.  She has been treated for more than 6 weeks and is to have LFTs checked today. --Order CMP --She will follow-up with PCP  Diabetes mellitus type 2 in obese Improve A1c from 9.6 to 8.3 today.  Patient  will discuss management with PCP at next office visit.  Skin inflammation Tip of right thum appears well healed with continued use of triamcinolone cream.  Initial thought was lesion was secondary to possible fungal infection with possible superimposed bacterial infection.  Patient has completed a course of Keflex.  Since then she has been on steroid cream with pretty good response.  There is no area of fluctuance, irritation or erythema noted on exam.  She continues to endorse mild tenderness under the nail however did not see any concerning signs for infection on exam.  Hyperpigmentation noted around  nailbed with no signs of infection.  Patient will follow-up with PCP in 2 weeks she will continue with steroid cream as needed.  Is currently also on terbinafine for lower extremity onychomycosis.  If initial lesion was suspected to be secondary to fungal infection I expect continued improvement current use of terbinafine.  Will check LFTs today.    Marjie Skiff, MD [place] PGY-3

## 2018-08-27 NOTE — Patient Instructions (Signed)
It was great seeing you today! We have addressed the following issues today  1. Please continue using the steroid cream as prescribed. 2. Your A1c is much improved 8.3.  3. I will check your liver function today and follow up on the results. 4. See your regular doctor in two weeks. Appointment has been made for you.  If we did any lab work today, and the results require attention, either me or my nurse will get in touch with you. If everything is normal, you will get a letter in mail and a message via . If you don't hear from [place] in two weeks, please give [place] a call. Otherwise, we look forward to seeing you again at your next visit. If you have any questions or concerns before then, please call the clinic at [phone].  Please bring all your medications to every doctors visit  Sign up for My Chart to have easy access to your labs results, and communication with your Primary care physician. Please ask Front Desk for some assistance.   Please check-out at the front desk before leaving the clinic.    Take Care,   Dr. Andy Gauss

## 2018-08-27 NOTE — Assessment & Plan Note (Addendum)
Tip of right thum appears well healed with continued use of triamcinolone cream.  Initial thought was lesion was secondary to possible fungal infection with possible superimposed bacterial infection.  Patient has completed a course of Keflex.  Since then she has been on steroid cream with pretty good response.  There is no area of fluctuance, irritation or erythema noted on exam.  She continues to endorse mild tenderness under the nail however did not see any concerning signs for infection on exam.  Hyperpigmentation noted around nailbed with no signs of infection.  Patient will follow-up with PCP in 2 weeks she will continue with steroid cream as needed.  Is currently also on terbinafine for lower extremity onychomycosis.  If initial lesion was suspected to be secondary to fungal infection I expect continued improvement current use of terbinafine.  Will check LFTs today.

## 2018-08-28 LAB — COMPREHENSIVE METABOLIC PANEL
ALT: 31 IU/L (ref 0–32)
AST: 21 IU/L (ref 0–40)
Albumin/Globulin Ratio: 1.4 (ref 1.2–2.2)
Albumin: 3.6 g/dL (ref 3.5–5.5)
Alkaline Phosphatase: 104 IU/L (ref 39–117)
BUN/Creatinine Ratio: 24 — ABNORMAL HIGH (ref 9–23)
BUN: 13 mg/dL (ref 6–24)
CALCIUM: 8.9 mg/dL (ref 8.7–10.2)
CHLORIDE: 101 mmol/L (ref 96–106)
CO2: 25 mmol/L (ref 20–29)
Creatinine, Ser: 0.55 mg/dL — ABNORMAL LOW (ref 0.57–1.00)
GFR calc Af Amer: 126 mL/min/{1.73_m2} (ref >59)
GFR calc non Af Amer: 109 mL/min/{1.73_m2} (ref >59)
Globulin, Total: 2.5 g/dL (ref 1.5–4.5)
Glucose: 258 mg/dL — ABNORMAL HIGH (ref 65–99)
Potassium: 4 mmol/L (ref 3.5–5.2)
Sodium: 138 mmol/L (ref 134–144)
Total Protein: 6.1 g/dL (ref 6.0–8.5)

## 2018-09-03 ENCOUNTER — Other Ambulatory Visit (HOSPITAL_COMMUNITY): Payer: Self-pay | Admitting: *Deleted

## 2018-09-03 DIAGNOSIS — Z1231 Encounter for screening mammogram for malignant neoplasm of breast: Secondary | ICD-10-CM

## 2018-09-12 ENCOUNTER — Ambulatory Visit: Payer: Self-pay | Admitting: Family Medicine

## 2018-09-17 NOTE — Progress Notes (Signed)
Subjective:   Patient ID: [place]    DOB: [birth year], 52 y.o. female   MRN: [id number]  Meredith Maynard is a 52 y.o. female with a history of HTN, GERD, DM2, OA, obesity, anxiety here for   Fungal infection of finger - Was seen previously 8/6, 10/14, 10/17, 10/28. Completed keflex course. Restarted lamisil 10/14 given incomplete course. LFTs checked at last visit wnl and improved from prior. Also with noted toe onychomycosis as well. - States thumb improved after initial Keflex course (completed entire course) but has since worsened and now notes increased pain and discharge. She has been using Neosporin on the area.  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   - no fevers, eating and drinking normally  Diabetes, Type 2 - Last A1c 8.3 08/27/2018 - Medications: glipizide 5mg  BID, metformin 100mg  BID - Compliance: only taking metformin. - Checking BG at home: 200, 180. Fasting 87-100. - Diet: limits carbs, focusing on portion control - Exercise: walking every day, ~4 hrs per day - Eye exam: order placed - Foot exam: UTD - Microalbumin: N/A  VAGINAL BLEEDING Patient reports irregular vaginal bleeding, states she has not gotten her menses this month but bled twice last month.  Each cycle lasts 4 to 5 days.  She normally has regular cycles with normal flow and passes clots on days 2-3. She thinks mom went through menopause at 56yo. Endorses night sweats, fatigue. Sometimes gets nausea with periods. She is taking iron supplements once daily for anemia.  She is currently sexually active without concern for STI exposure denies personal or family history of uterine cancer.  Symptoms Weight loss: no Weight gain: no Dysuria: sometimes when sugars are high Back pain: lower back, no new pain Genital sores or ulcers: no Pain during sex: no  Review of Systems:  Per HPI.  [place], medications and smoking status  reviewed.  Objective:   BP 120/70 (BP Location: Left Arm, Patient Position: Sitting, Cuff Size: Normal)   Pulse 76   Temp 98.8 F (37.1 C) (Oral)   Wt 236 lb 12.8 oz (107.4 kg)   SpO2 98%   BMI 39.41 kg/m  Vitals and nursing note reviewed.  General: Obese female, in no acute distress with non-toxic appearance CV: regular rate and rhythm without murmurs, rubs, or gallops, no lower extremity edema Lungs: clear to auscultation bilaterally with normal work of breathing Skin: warm, dry. R thumb with clear discharge and surrounding erythema without streaking. Opaque, fluctuant area noted under nailbed, exquisitely tender to palpation. Extremities: warm and well perfused, normal tone MSK: ROM grossly intact, strength intact, gait normal Neuro: Alert and oriented, speech normal  Assessment & Plan:   Diabetes mellitus type 2 in obese Improvement in A1c to 8.3 from 9.6 with diet and exercise changes and metformin. Congratulated patient on lifestyle modifications. Given improvement, will not make medication adjustments today. Will have patient f/u in 2 months for repeat A1c.  Irregular menstrual cycle Likely 2/2 menopause given concurrent sx of fatigue, night sweats. Advised symptomatic relief with conservative measures. Could also try black cohosh for relief. Will check CBC given h/o anemia. Will also check TSH given fatigue and occasional constipation.  OBESITY, NOS Contributing to diabetes and likely menopause sx. Encouraged continued lifestyle modifications through diet and exercise.   Onychomycosis Ongoing, continues on terbinafine. Now with superimposed infection with discharge and fluctuance under nail bed. Will prescribe Keflex course. Encouraged to wear gloves while cleaning to prevent further insult.  Orders Placed This Encounter  Procedures  . CBC  . Iron, TIBC and Ferritin Panel  . TSH   Meds ordered this encounter  Medications  . cephALEXin (KEFLEX) 500 MG capsule     Sig: Take 1 capsule (500 mg total) by mouth 4 (four) times daily.    Dispense:  20 capsule    Refill:  0    Rory Percy, DO PGY-2, [facility] 09/18/2018 3:26 PM

## 2018-09-18 ENCOUNTER — Encounter: Payer: Self-pay | Admitting: Family Medicine

## 2018-09-18 ENCOUNTER — Ambulatory Visit (INDEPENDENT_AMBULATORY_CARE_PROVIDER_SITE_OTHER): Payer: Self-pay | Admitting: Family Medicine

## 2018-09-18 VITALS — BP 120/70 | HR 76 | Temp 98.8°F | Wt 236.8 lb

## 2018-09-18 DIAGNOSIS — E669 Obesity, unspecified: Secondary | ICD-10-CM

## 2018-09-18 DIAGNOSIS — N926 Irregular menstruation, unspecified: Secondary | ICD-10-CM

## 2018-09-18 DIAGNOSIS — R5383 Other fatigue: Secondary | ICD-10-CM

## 2018-09-18 DIAGNOSIS — D649 Anemia, unspecified: Secondary | ICD-10-CM

## 2018-09-18 DIAGNOSIS — B351 Tinea unguium: Secondary | ICD-10-CM

## 2018-09-18 DIAGNOSIS — Z6839 Body mass index (BMI) 39.0-39.9, adult: Secondary | ICD-10-CM

## 2018-09-18 DIAGNOSIS — E1169 Type 2 diabetes mellitus with other specified complication: Secondary | ICD-10-CM

## 2018-09-18 MED ORDER — CEPHALEXIN 500 MG PO CAPS: 500.0000 mg | ORAL_CAPSULE | Freq: Four times a day (QID) | ORAL | 0 refills | Status: DC

## 2018-09-18 NOTE — Patient Instructions (Signed)
It was great to see you!  Our plans for today:  - Congratulations on watching what your eat! Your blood sugars are better, keep doing what you are doing! - Try black cohosh for your menopause symptoms. - Call the pharmacy about your refills. - We are checking some labs today, we will call you or send you a letter if they are abnormal.   Take care and seek immediate care sooner if you develop any concerns.   Dr. [facility]  La menopausia (Menopause) La menopausia es el perodo normal de la vida en el cual los perodos menstruales cesan por completo. Se completa cuando no se tienen 12 perodos menstruales consecutivos. Ocurre generalmente en mujeres entre los 40 y los 26 aos. Muy rara vez una mujer tiene la menopausia antes de los 40 [place]. En la menopausia, los ovarios dejan de producir las hormonas femeninas estrgeno y Immunologist. Esto puede causar sntomas indeseables y Technical brewer a Technical sales engineer. A veces los sntomas aparecen entre 4 y 5 aos antes de que comience la menopausia. No existe una relacin [employer] y:  [place] anticonceptivos orales.  La cantidad de hijos que tuvo.  La raza.  La edad en que comenzaron los perodos menstruales (menarca). Las mujeres que fuman mucho y las que son muy delgadas pueden desarrollar la menopausia a una edad ms temprana. CAUSAS  Los ovarios dejan de producir las hormonas femeninas estrgeno y Immunologist.  Otras causas son: ? [place] en la que se extirpan ambos ovarios. ? Los ovarios que dejan de funcionar sin un motivo conocido. ? Tumores de la glndula pituitaria en el cerebro. ? Enfermedades que [employer] ovarios y la produccin de hormonas. ? Radioterapia en el abdomen o la pelvis. ? Quimioterapia que afecte a los ovarios.  SNTOMAS  Acaloramiento.  Sudoracin nocturna.  Disminucin del deseo sexual.  Sequedad vaginal y adelgazamiento de la vagina, lo que causa relaciones sexuales  dolorosas.  Sequedad de la piel y aparicin de Glass blower/designer.  Dolores de [place].  Cansancio.  Irritabilidad.  Problemas de memoria.  Aumento de [place].  Infeccin de la vejiga.  Aparicin de vello en el rostro y [place].  Infertilidad. Los sntomas ms graves pueden incluir:  Prdida de masa sea osteoporosis) que favorece la rotura de huesos(fracturas).  Depresin.  Endurecimiento y Public librarian de las arterias (aterosclerosis) lo que puede causar infarto e ictus. DIAGNSTICO  El perodo menstrual no aparece por 12 meses seguidos.  Examen fsico.  Estudios hormonales de Herbalist.  TRATAMIENTO Hay muchas opciones de tratamiento y casi tantas dudas como opciones existen. La decisin de tratar o no los cambios que trae la menopausia es una decisin que realiza el profesional de acuerdo con cada Advertising copywriter. El Management consultant las opciones de tratamiento con usted. Juntos podrn decidir qu tratamiento es el mejor para usted. Las opciones de tratamiento pueden incluir:  Terapia hormonal (estrgenos y Immunologist).  Medicamentos no hormonales.  Tratamiento de los sntomas individuales con medicamentos (por ejemplo antidepresivos para la depresin).  Algunos medicamentos herbales pueden ayudar en sntomas especficos.  Psicoterapia con un psiclogo o psiquiatra.  Terapia grupal.  Cambios en el estilo de vida, como: ? Consumir una dieta saludable. ? Actividad fsica regular. ? Limitar el consumo de cafena y alcohol. ? Control del estrs y Public affairs consultant.  No realizar tratamiento. [place] todos los medicamentos como el mdico le indique.  Descanse y duerma lo suficiente.  Haga ejercicios  regularmente.  Consuma una dieta que contenga calcio (bueno para los [place]) y productos derivados de la soja (actan como estrgenos).  Evite las bebidas alcohlicas.  No fume.  Si tiene sofocones, vstase en  capas.  Tome suplementos de calcio y vitamina D para fortalecer los [place].  Puede utilizar lubricantes y humectantes de venta libre para la sequedad vaginal.  En algunos casos la terapia de grupo podr ayudarla.  La acupuntura puede ser de ayuda en ciertos casos.  SOLICITE ATENCIN MDICA SI:  No est segura de estar en la menopausia.  Tiene sntomas de [place] y [place] asesoramiento y Clinical research associate.  Tiene 75 aos y an tiene Chartered certified accountant.  Tiene dolor durante las [employer].  La menopausia se ha completado (no ha tenido el perodo menstrual durante 12 meses) y tiene un sangrado vaginal.  Necesita ser derivada a un especialista (gineclogo, psiquiatra, o psiclogo) para Arts administrator.  SOLICITE ATENCIN MDICA DE INMEDIATO SI:  Siente una depresin intensa e incontrolable.  Tiene una hemorragia vaginal abundante.  Siente y cree que se ha fracturado un hueso.  Siente dolor al [employer].  Siente dolor en el pecho o en la pierna.  Tiene latidos cardacos rpidos (palpitaciones).  Siente dolor de cabeza intenso.  Tiene problemas de visin.  Siente un bulto en la mama.  Siente un dolor abdominal intenso o indigestin.  Esta informacin no tiene Marine scientist el consejo del mdico. Asegrese de hacerle al mdico cualquier pregunta que tenga. Document Released: 11/28/2006 Document Revised: 06/19/2013 Document Reviewed: 05/16/2013 Elsevier Interactive Patient Education  2017 [employer].

## 2018-09-18 NOTE — Assessment & Plan Note (Signed)
Contributing to diabetes and likely menopause sx. Encouraged continued lifestyle modifications through diet and exercise.

## 2018-09-18 NOTE — Assessment & Plan Note (Signed)
Improvement in A1c to 8.3 from 9.6 with diet and exercise changes and metformin. Congratulated patient on lifestyle modifications. Given improvement, will not make medication adjustments today. Will have patient f/u in 2 months for repeat A1c.

## 2018-09-18 NOTE — Assessment & Plan Note (Signed)
Ongoing, continues on terbinafine. Now with superimposed infection with discharge and fluctuance under nail bed. Will prescribe Keflex course. Encouraged to wear gloves while cleaning to prevent further insult.

## 2018-09-18 NOTE — Assessment & Plan Note (Signed)
Likely 2/2 menopause given concurrent sx of fatigue, night sweats. Advised symptomatic relief with conservative measures. Could also try black cohosh for relief. Will check CBC given h/o anemia. Will also check TSH given fatigue and occasional constipation.

## 2018-09-19 LAB — CBC
HEMATOCRIT: 37.5 % (ref 34.0–46.6)
Hemoglobin: 11.3 g/dL (ref 11.1–15.9)
MCH: 22.8 pg — ABNORMAL LOW (ref 26.6–33.0)
MCHC: 30.1 g/dL — AB (ref 31.5–35.7)
MCV: 76 fL — ABNORMAL LOW (ref 79–97)
PLATELETS: 223 10*3/uL (ref 150–450)
RBC: 4.96 x10E6/uL (ref 3.77–5.28)
RDW: 17.9 % — AB (ref 12.3–15.4)
WBC: 8.3 10*3/uL (ref 3.4–10.8)

## 2018-09-19 LAB — IRON,TIBC AND FERRITIN PANEL
FERRITIN: 5 ng/mL — AB (ref 15–150)
IRON: 36 ug/dL (ref 27–159)
Iron Saturation: 8 % — CL (ref 15–55)
TIBC: 429 ug/dL (ref 250–450)
UIBC: 393 ug/dL (ref 131–425)

## 2018-09-19 LAB — TSH: TSH: 0.264 u[IU]/mL — AB (ref 0.450–4.500)

## 2018-09-20 ENCOUNTER — Telehealth: Payer: Self-pay

## 2018-09-20 NOTE — Telephone Encounter (Signed)
-----   Message from Rory Percy, DO sent at 09/19/2018  6:20 PM EST ----- Please let patient know her iron is low, she should take her iron supplementation TWICE daily instead of once daily as she had been doing.

## 2018-09-20 NOTE — Telephone Encounter (Signed)
Using pacific interpreter [phone], pt called and informed of the need to take her iron supplement twice daily.

## 2018-09-24 ENCOUNTER — Ambulatory Visit (INDEPENDENT_AMBULATORY_CARE_PROVIDER_SITE_OTHER): Payer: Self-pay | Admitting: Family Medicine

## 2018-09-24 ENCOUNTER — Other Ambulatory Visit: Payer: Self-pay

## 2018-09-24 ENCOUNTER — Ambulatory Visit (HOSPITAL_COMMUNITY)
Admission: RE | Admit: 2018-09-24 | Discharge: 2018-09-24 | Disposition: A | Discharge status: Home / Self Care | Payer: Self-pay | Source: Ambulatory Visit | Attending: Family Medicine | Admitting: Family Medicine

## 2018-09-24 ENCOUNTER — Other Ambulatory Visit: Payer: Self-pay | Admitting: Family Medicine

## 2018-09-24 VITALS — BP 118/78 | HR 85 | Temp 97.9°F | Ht 65.0 in | Wt 236.0 lb

## 2018-09-24 DIAGNOSIS — D649 Anemia, unspecified: Secondary | ICD-10-CM

## 2018-09-24 DIAGNOSIS — R7989 Other specified abnormal findings of blood chemistry: Secondary | ICD-10-CM

## 2018-09-24 DIAGNOSIS — L03011 Cellulitis of right finger: Secondary | ICD-10-CM

## 2018-09-24 DIAGNOSIS — R42 Dizziness and giddiness: Secondary | ICD-10-CM

## 2018-09-24 HISTORY — DX: Dizziness and giddiness: R42

## 2018-09-24 LAB — POCT HEMOGLOBIN: HEMOGLOBIN: 9.5 g/dL (ref 9.5–13.5)

## 2018-09-24 MED ORDER — MUPIROCIN 2 % EX OINT: 1.0000 "application " | TOPICAL_OINTMENT | Freq: Two times a day (BID) | CUTANEOUS | 0 refills | Status: DC

## 2018-09-24 NOTE — Assessment & Plan Note (Addendum)
Acute.  Uncertain etiology.  EKG NSR at 71 bpm without ST changes or T wave abnormalities, QTC is 465.  Normal orthostatics.  Hemoglobin stable consistent with prior hemoglobin last week.  Recent TSH did appear low which could contribute to symptoms.  May also be related to Keflex given correlation with onset of symptoms when treatment was initiated, dizziness is a common side effect. - Discontinue Keflex and initiate topical mupirocin for paronychia - Reviewed return precautions

## 2018-09-24 NOTE — Progress Notes (Signed)
   Subjective   Patient ID: [place]    DOB: Nov 07, 1965, 52 y.o. female   MRN: [id number]  CC: "I am feeling dizzy"  HPI: Meredith Maynard is a 52 y.o. female who presents to clinic today for the following:  DIZZINESS  Feeling dizzy for 3 days. Dizziness is lightheadedness, denies objects moving that should not Feels like room spins: no Lightheadedness when stands: no Palpitations or heart racing: no Prior dizziness: yes, diagnosed with vertigo 2017, this is different Medications tried: was taking Keflex for finger infection which she thinks caused the onset of dizziness Taking blood thinners: no  Symptoms Hearing Loss: no Ear Pain or fullness: some fullness to right ear with vertigo but not this Nausea or vomiting: no Vision difficulty or double vision: no, just black spots at times Falls: no Head trauma: no Weakness in arm or leg: no Speaking problems: no Headache: yes  ROS: see HPI for pertinent.  [place]: Reviewed. Smoking status reviewed. Medications reviewed.  Objective   BP 128/78   Pulse 88   Temp 97.9 F (36.6 C) (Oral)   Ht 5\' 5"  (1.651 m)   Wt 236 lb (107 kg)   SpO2 99%   BMI 39.27 kg/m  Vitals and nursing note reviewed.  General: well nourished, well developed, NAD with non-toxic appearance HEENT: normocephalic, atraumatic, moist mucous membranes, PERRLA, EOMI, no nystagmus Neck: supple, non-tender without lymphadenopathy Cardiovascular: regular rate and rhythm without murmurs, rubs, or gallops Lungs: clear to auscultation bilaterally with normal work of breathing Abdomen: obese soft, non-tender, non-distended, normoactive bowel sounds Skin: warm, dry, no rashes or lesions, cap refill < 2 seconds Extremities: warm and well perfused, normal tone, no edema Neuro: CN-XII intact, fine motor intact, negative Romberg sign  Assessment & Plan   Lightheadedness Acute.  Uncertain etiology.  EKG NSR at 71 bpm without ST changes or  T wave abnormalities, QTC is 465.  Normal orthostatics.  Hemoglobin stable consistent with prior hemoglobin last week.  Recent TSH did appear low which could contribute to symptoms.  May also be related to Keflex given correlation with onset of symptoms when treatment was initiated, dizziness is a common side effect. - Discontinue Keflex and initiate topical mupirocin for paronychia - Reviewed return precautions   Orders Placed This Encounter  Procedures  . Hemoglobin  . EKG 12-Lead   Meds ordered this encounter  Medications  . mupirocin ointment (BACTROBAN) 2 %    Sig: Place 1 application into the nose 2 (two) times daily.    Dispense:  22 g    Refill:  0    Harriet Butte, DO [place], PGY-3 09/24/2018, 4:10 PM

## 2018-09-24 NOTE — Patient Instructions (Signed)
Thank you for coming in to see [place] today. Please see below to review our plan for today's visit.  Your blood levels are normal and you are not related to your lightheadedness.  This could likely represent menopause.  We checked your heart which did not show any arrhythmias.  Dizziness is a common side effect for the antibiotic you are he received.  I am not certain that this is the source, however we can discontinue it and start you on a different antibiotic that you can apply to the finger daily after a 10-minute soak in warm water over the next week.  Please call the clinic at [phone] if your symptoms worsen or you have any concerns. It was our pleasure to serve you.  Harriet Butte, [place], PGY-3

## 2018-09-25 NOTE — Progress Notes (Signed)
Used phone interpreter- [phone]. Spoke with husband, he said he would let pt know about making lab appt.  Salvatore Marvel, CMA

## 2018-11-12 ENCOUNTER — Telehealth: Payer: Self-pay | Admitting: Family Medicine

## 2018-11-12 ENCOUNTER — Other Ambulatory Visit: Payer: Self-pay

## 2018-11-12 DIAGNOSIS — E1169 Type 2 diabetes mellitus with other specified complication: Secondary | ICD-10-CM

## 2018-11-12 DIAGNOSIS — E669 Obesity, unspecified: Principal | ICD-10-CM

## 2018-11-12 MED ORDER — METFORMIN HCL 1000 MG PO TABS: 1000.0000 mg | ORAL_TABLET | Freq: Two times a day (BID) | ORAL | 3 refills | Status: DC

## 2018-11-12 NOTE — Telephone Encounter (Signed)
Error. Patient made an appointment for ATC tomorrow.  Jazmin Hartsell,CMA

## 2018-11-13 ENCOUNTER — Other Ambulatory Visit: Payer: Self-pay

## 2018-11-13 ENCOUNTER — Telehealth: Payer: Self-pay

## 2018-11-13 ENCOUNTER — Ambulatory Visit (INDEPENDENT_AMBULATORY_CARE_PROVIDER_SITE_OTHER): Payer: Self-pay | Admitting: Family Medicine

## 2018-11-13 VITALS — BP 124/70 | HR 68 | Temp 98.6°F | Wt 237.2 lb

## 2018-11-13 DIAGNOSIS — E1169 Type 2 diabetes mellitus with other specified complication: Secondary | ICD-10-CM

## 2018-11-13 DIAGNOSIS — M79644 Pain in right finger(s): Secondary | ICD-10-CM

## 2018-11-13 DIAGNOSIS — E669 Obesity, unspecified: Secondary | ICD-10-CM

## 2018-11-13 DIAGNOSIS — G8929 Other chronic pain: Secondary | ICD-10-CM

## 2018-11-13 MED ORDER — LISINOPRIL 20 MG PO TABS: 20.0000 mg | ORAL_TABLET | Freq: Every day | ORAL | 11 refills | Status: DC

## 2018-11-13 NOTE — Telephone Encounter (Signed)
Pt wanted me to cancel her ultrasound appt that was made for Jan. 21, 2019, due to having to self pay. Salvatore Marvel, CMA

## 2018-11-13 NOTE — Progress Notes (Signed)
med

## 2018-11-13 NOTE — Progress Notes (Signed)
Subjective:    Patient ID: Meredith Maynard, female    DOB: [birth year], 53 y.o.   MRN: [id number]   CC: Medication refill and right thumb pain  HPI: Patient is a 53 yo female who presents today to discuss medication refill and chronic right thumb pain. Patient reports she has ran out of her metformin and will need new prescription for that. She also reports only having a few Lisinopril tabs left. Patient also here to discuss right thumb/nail pain. Patient is currently being treated fungal infection with Terbinafine and only have another day to go. She reports that she feels like something is wrong with her nail and the soft tissue below it. She report pain with palpation. She denies any drainage, redness or bleeding from her finger.  Smoking status reviewed   ROS: all other systems were reviewed and are negative other than in the HPI   Past Medical History:  Diagnosis Date  . Anxiety   . Fatty liver   . Gastritis   . GERD (gastroesophageal reflux disease)   . HLD (hyperlipidemia)   . Hypertension   . Meniere syndrome   . Panic attacks   . Somatic complaints, multiple   . T2DM (type 2 diabetes mellitus) ([place])     Past Surgical History:  Procedure Laterality Date  . Abdominal U/S  2010   Other than fatty liver, normal  . CESAREAN SECTION  2000,2004,2005   x3  . Exercise Treadmill Test  2010   Normal, poor exercise tolerance  . INCISIONAL HERNIA REPAIR N/A 01/04/2016   Procedure: RECURRENT INCISIONAL HERNIA;  Surgeon: Fanny Skates, MD;  Location: [facility];  Service: General;  Laterality: N/A;  . INSERTION OF MESH N/A 01/04/2016   Procedure: INSERTION OF MESH;  Surgeon: Fanny Skates, MD;  Location: [place];  Service: General;  Laterality: N/A;  PHASIX MESH  . LAPAROTOMY N/A 01/04/2016   Procedure: EXPLORATORY LAPAROTOMY AND LYSIS OF ADHESIONS ;  Surgeon: Fanny Skates, MD;  Location: [facility];  Service: General;  Laterality: N/A;  . SMALL INTESTINE SURGERY  2007   Dr.  Hulen Skains  . TUBAL LIGATION    . UMBILICAL HERNIA REPAIR  2007   Dr. Hulen Skains    Past medical history, surgical, family, and social history reviewed and updated in the EMR as appropriate.  Objective:  BP 124/70   Pulse 68   Temp 98.6 F (37 C) (Oral)   Wt 237 lb 4 oz (107.6 kg)   LMP 10/17/2018   SpO2 97%   BMI 39.48 kg/m   Vitals and nursing note reviewed  General: NAD, pleasant, able to participate in exam Cardiac: RRR, normal heart sounds, no murmurs. 2+ radial and PT pulses bilaterally Respiratory: CTAB, normal effort, No wheezes, rales or rhonchi Abdomen: soft, nontender, nondistended, no hepatic or splenomegaly, +BS Extremities: no edema or cyanosis. WWP.  Right thumbnail with mild discoloration.  Surrounding soft tissue with any erythema, swelling, drainage.  Full range of motion left thumb at the PIP and DIP.  Vascularly intact sensation intact strength intact Skin: warm and dry, no rashes noted Neuro: alert and oriented x4, no focal deficits Psych: Normal affect and mood   Assessment & Plan:   Chronic pain of right thumb Patient presents today complaining of continued right thumb pain.  Patient is currently on terbinafine for fungal infection of the right thumb.  Mild discoloration noted of the nail however remaining of the exam is within normal limits.  Minimal concern for abscess based on  exam findings.  However, given chronicity of patient's symptoms, will order soft tissue ultrasound of the right thumb to rule out any subungual abscess which could be causing of thumb pain.  Patient is about to complete therapy which was started on 10/14.  Patient does not have insurance but will like ultrasound of her thumb.  Will place order and based on cost, patient will decide if she wants to move forward or follow-up with PCP for thumb pain as needed.    Marjie Skiff, MD [place] PGY-3

## 2018-11-13 NOTE — Assessment & Plan Note (Addendum)
Patient presents today complaining of continued right thumb pain.  Patient is currently on terbinafine for fungal infection of the right thumb.  Mild discoloration noted of the nail however remaining of the exam is within normal limits.  Minimal concern for abscess based on exam findings.  However, given chronicity of patient's symptoms, will order soft tissue ultrasound of the right thumb to rule out any subungual abscess which could be causing of thumb pain.  Patient is about to complete therapy which was started on 10/14.  Patient does not have insurance but will like ultrasound of her thumb.  Will place order and based on cost, patient will decide if she wants to move forward or follow-up with PCP for thumb pain as needed.

## 2018-11-20 ENCOUNTER — Ambulatory Visit (HOSPITAL_COMMUNITY): Payer: Self-pay

## 2018-12-06 ENCOUNTER — Ambulatory Visit (HOSPITAL_COMMUNITY)
Admission: RE | Admit: 2018-12-06 | Discharge: 2018-12-06 | Disposition: A | Discharge status: Home / Self Care | Payer: Self-pay | Source: Ambulatory Visit | Attending: Obstetrics and Gynecology | Admitting: Obstetrics and Gynecology

## 2018-12-06 ENCOUNTER — Encounter (HOSPITAL_COMMUNITY): Payer: Self-pay

## 2018-12-06 ENCOUNTER — Ambulatory Visit
Admission: RE | Admit: 2018-12-06 | Discharge: 2018-12-06 | Disposition: A | Discharge status: Home / Self Care | Payer: Self-pay | Source: Ambulatory Visit | Attending: Obstetrics and Gynecology | Admitting: Obstetrics and Gynecology

## 2018-12-06 VITALS — BP 124/78 | Wt 232.0 lb

## 2018-12-06 DIAGNOSIS — Z1231 Encounter for screening mammogram for malignant neoplasm of breast: Secondary | ICD-10-CM

## 2018-12-06 DIAGNOSIS — Z1239 Encounter for other screening for malignant neoplasm of breast: Secondary | ICD-10-CM

## 2018-12-06 NOTE — Patient Instructions (Signed)
Explained breast self awareness with [employer]. Patient did not need a Pap smear today due to last Pap smear was 09/07/2016. Let her know [facility] will cover Pap smears every 3 years unless has a history of abnormal Pap smears. Referred patient to the [facility] for a screening mammogram. Appointment scheduled for Thursday, December 06, 2018 at 1310. Patient aware of appointment and will be there. Let patient know the [facility] will follow up with her within the next couple weeks with results of mammogram by letter or phone. [place] verbalized understanding.  Shamila Lerch, [place], RN 2:12 PM

## 2018-12-06 NOTE — Progress Notes (Signed)
No complaints today.   Pap Smear: Pap smear not completed today. Last Pap smear was 09/07/2016 at [facility] and normal. Per patient has no history of an abnormal Pap smear. Last Pap smear result is in Epic.  Physical exam: Breasts Breasts symmetrical. No skin abnormalities bilateral breasts. No nipple retraction bilateral breasts. No nipple discharge bilateral breasts. No lymphadenopathy. No lumps palpated bilateral breasts. No complaints of pain or tenderness on exam. Referred patient to the [facility] for a screening mammogram. Appointment scheduled for Thursday, December 06, 2018 at 1310.        Pelvic/Bimanual No Pap smear completed today since last Pap smear was 09/07/2016. Pap smear not indicated per BCCCP guidelines.   Smoking History: Patient has never smoked.  Patient Navigation: Patient education provided. Access to services provided for patient through [facility] program. Spanish interpreter provided.   Colorectal Cancer Screening: Per patient has never had a colonoscopy completed. No complaints today. FIT Test given to patient to complete and return to [facility]CP.  Breast and Cervical Cancer Risk Assessment: Patient has no family history of breast cancer, known genetic mutations, or radiation treatment to the chest before age 53. Patient has no history of cervical dysplasia, immunocompromised, or DES exposure in-utero.  Risk Assessment    Risk Scores      12/06/2018   Last edited by: Armond Hang, LPN   5-year risk: 0.9 %   Lifetime risk: 7.6 %         Used Spanish interpreter Rudene Anda from [place].

## 2018-12-07 ENCOUNTER — Encounter (HOSPITAL_COMMUNITY): Payer: Self-pay | Admitting: *Deleted

## 2018-12-24 ENCOUNTER — Ambulatory Visit: Payer: Self-pay

## 2019-01-10 ENCOUNTER — Ambulatory Visit (INDEPENDENT_AMBULATORY_CARE_PROVIDER_SITE_OTHER): Payer: Self-pay | Admitting: Family Medicine

## 2019-01-10 ENCOUNTER — Other Ambulatory Visit: Payer: Self-pay

## 2019-01-10 ENCOUNTER — Encounter: Payer: Self-pay | Admitting: Family Medicine

## 2019-01-10 VITALS — BP 120/70 | HR 63 | Temp 98.1°F | Wt 230.0 lb

## 2019-01-10 DIAGNOSIS — E1169 Type 2 diabetes mellitus with other specified complication: Secondary | ICD-10-CM

## 2019-01-10 DIAGNOSIS — E669 Obesity, unspecified: Secondary | ICD-10-CM

## 2019-01-10 DIAGNOSIS — D171 Benign lipomatous neoplasm of skin and subcutaneous tissue of trunk: Secondary | ICD-10-CM

## 2019-01-10 DIAGNOSIS — I10 Essential (primary) hypertension: Secondary | ICD-10-CM

## 2019-01-10 DIAGNOSIS — M4722 Other spondylosis with radiculopathy, cervical region: Secondary | ICD-10-CM

## 2019-01-10 LAB — POCT GLYCOSYLATED HEMOGLOBIN (HGB A1C): HbA1c, POC (controlled diabetic range): 10.9 % — AB (ref 0.0–7.0)

## 2019-01-10 MED ORDER — GABAPENTIN 100 MG PO CAPS: 200.0000 mg | ORAL_CAPSULE | Freq: Two times a day (BID) | ORAL | 3 refills | Status: DC

## 2019-01-10 MED ORDER — METFORMIN HCL 1000 MG PO TABS: 1000.0000 mg | ORAL_TABLET | Freq: Two times a day (BID) | ORAL | 3 refills | Status: DC

## 2019-01-10 MED ORDER — LISINOPRIL 20 MG PO TABS: 20.0000 mg | ORAL_TABLET | Freq: Every day | ORAL | 3 refills | Status: DC

## 2019-01-10 NOTE — Assessment & Plan Note (Signed)
Uncontrolled and worsening.  Counseled on diet and told to continue metformin 1000 mg twice daily.  Advised patient come back for appointment with PCP as she will likely need additional diabetes medications for better control.

## 2019-01-10 NOTE — Patient Instructions (Signed)
Come back when you have insurance for bloodwork and diabetes

## 2019-01-10 NOTE — Progress Notes (Signed)
Subjective:  Meredith Maynard is a 53 y.o. female who presents to the [facility] today with a chief complaint of neck pain and concern about a lump on her shoulder.   HPI:  She has had longstanding neck pain that has radiated down her left arm previously and is now radiating down her right.  Is improved by gabapentin she has had no new injuries or trauma.  It is a dull aching sensation that has been there for years.  She does have some sensation of tingling.  She says over the last few months that she has noticed swelling of just her right wrist that is worse in the mornings and then resolved.  She has had a small lump in her upper back right area that is been there since at least 2013.  It has not changed.  She has had no redness or tenderness of the area.  She just wants it looked at today  She needs refills on her metformin and lisinopril.  She takes metformin 1000 mg twice a day and is tolerating well.  She was previously on glipizide but has not been on it in some time.    ROS: Per HPI  Social Hx: She reports that she has never smoked. She has never used smokeless tobacco. She reports that she does not drink alcohol or use drugs.   Objective:  Physical Exam: BP 120/70   Pulse 63   Temp 98.1 F (36.7 C) (Oral)   Wt 230 lb (104.3 kg)   LMP 12/15/2018   SpO2 99%   BMI 38.27 kg/m   Gen: NAD, resting comfortably CV: RRR with no murmurs appreciated Pulm: NWOB, CTAB with no crackles, wheezes, or rhonchi GI: Normal bowel sounds present. Soft, Nontender, Nondistended. MSK: no edema, cyanosis, or clubbing noted. No cervical midline tenderness. 5/5 strength in bilateral UL. DTRs 2+ in UL. Sensation intact. No appreciable joint swelling of both wrists or fingers. Skin: warm, dry. Small 1 cm subcutaneous mass without erythema or pain on palpation Neuro: grossly normal, moves all extremities Psych: Normal affect and thought content  Results for orders placed or performed in  visit on 01/10/19 (from the past 72 hour(s))  HgB A1c     Status: Abnormal   Collection Time: 01/10/19 10:19 AM  Result Value Ref Range   Hemoglobin A1C     HbA1c POC (<> result, manual entry)     HbA1c, POC (prediabetic range)     HbA1c, POC (controlled diabetic range) 10.9 (A) 0.0 - 7.0 %     Assessment/Plan:  Osteoarthritis of spine with radiculopathy, cervical region Patient has known severe degenerative disc at C5-C6 with bilateral neuroforaminal stenosis as seen on x-ray on 02/26/2018.  She has had symptomatic improvement with gabapentin and NSAIDs in the past.  I suspect that this is the cause of her current pain.  Given her complaints of joint swelling that were not appreciated on exam.  Offered blood work to look for inflammatory markers.  Patient declined due to cost prefers to wait until her orange card is renewed.  Lipoma Per chart review patient has had a stable lipoma in this area since at least February 2013.  Reassured patient  Diabetes mellitus type 2 in obese Uncontrolled and worsening.  Counseled on diet and told to continue metformin 1000 mg twice daily.  Advised patient come back for appointment with PCP as she will likely need additional diabetes medications for better control.  HYPERTENSION, BENIGN SYSTEMIC Stable refills of lisinopril  given   Bufford Lope, DO PGY-3, [facility] 01/10/2019 10:29 AM

## 2019-01-10 NOTE — Assessment & Plan Note (Signed)
Patient has known severe degenerative disc at C5-C6 with bilateral neuroforaminal stenosis as seen on x-ray on 02/26/2018.  She has had symptomatic improvement with gabapentin and NSAIDs in the past.  I suspect that this is the cause of her current pain.  Given her complaints of joint swelling that were not appreciated on exam.  Offered blood work to look for inflammatory markers.  Patient declined due to cost prefers to wait until her orange card is renewed.

## 2019-01-10 NOTE — Assessment & Plan Note (Signed)
Stable refills of lisinopril given

## 2019-01-10 NOTE — Assessment & Plan Note (Signed)
Per chart review patient has had a stable lipoma in this area since at least February 2013.  Reassured patient

## 2019-02-01 ENCOUNTER — Ambulatory Visit: Payer: PRIVATE HEALTH INSURANCE | Admitting: Family Medicine

## 2019-02-11 ENCOUNTER — Other Ambulatory Visit: Payer: Self-pay

## 2019-02-11 ENCOUNTER — Ambulatory Visit (INDEPENDENT_AMBULATORY_CARE_PROVIDER_SITE_OTHER): Payer: PRIVATE HEALTH INSURANCE | Admitting: Family Medicine

## 2019-02-11 VITALS — BP 112/62 | HR 72 | Temp 98.3°F | Ht 65.0 in | Wt 234.2 lb

## 2019-02-11 DIAGNOSIS — L03019 Cellulitis of unspecified finger: Secondary | ICD-10-CM

## 2019-02-11 DIAGNOSIS — E669 Obesity, unspecified: Secondary | ICD-10-CM

## 2019-02-11 DIAGNOSIS — E1169 Type 2 diabetes mellitus with other specified complication: Secondary | ICD-10-CM

## 2019-02-11 MED ORDER — AGAMATRIX PRESTO W/DEVICE KIT: 1.0000 | PACK | Freq: Every day | 0 refills | Status: AC

## 2019-02-11 MED ORDER — CEPHALEXIN 500 MG PO CAPS: 500.0000 mg | ORAL_CAPSULE | Freq: Three times a day (TID) | ORAL | 0 refills | Status: DC

## 2019-02-11 MED ORDER — GLIPIZIDE 5 MG PO TABS: 5.0000 mg | ORAL_TABLET | Freq: Two times a day (BID) | ORAL | 3 refills | Status: DC

## 2019-02-11 NOTE — Assessment & Plan Note (Signed)
Abscess easily visualized on the pad of patient's right thumb.  After consent was provided, patient's thumb was cleaned with alcohol, a cool spray was applied, and an 18-gauge needle was used to incise and drain the abscess.  Copious yellow pus was drained and a sample was collected for culture.  After antibiotic ointment and a bandage were applied to the area, patient was given a prescription for Keflex 500 mg 3 times daily for 7 days.  A follow-up appointment was also made for her for this Friday so that improvement could be monitored.  Patient was given return precautions, including worsening pain, more collection of pus, pain in the joints of her right thumb, and streaking erythema of her thumb.  She expressed understanding.

## 2019-02-11 NOTE — Progress Notes (Signed)
Subjective:    [place] - 53 y.o. female MRN [id number]  Date of birth: [birth year]  CC:  Meredith Maynard is here for concern for an infection of her right thumb.  HPI:  Right thumb infection Patient reports that 1 week ago on Monday, patient slammed the distal end of her right thumb between her car window and car door.  Since then, she has had pain, and a pins and needle sensation, and an expanding area of pus just underneath her right thumbnail.  She has had normal range of motion of her fingers and has had no constitutional symptoms.  She has tried cleaning the area with alcohol, using an antibiotic cream, and soaking her finger in salt water without improvement in her symptoms.  She denies any redness extending up her finger or in her hand.  Type 2 Diabetes Mellitus Blood sugars: Patient has lost her glucometer and lost her job 3 weeks ago, so she has not been able to afford a new one Medications: Continues to take metformin 1000 mg twice daily and restarted glipizide 5 mg twice daily 2 days ago because she was worried that her high blood sugars were affecting the healing of her right thumb, and she thought her blood sugars were high because she had frequent dry mouth and burning on urination.  She says that she has had urinalyses before when she has had burning on urination that only showed glucosuria, so she does not think that she has a UTI.  She says that she can easily afford the glipizide and metformin at [facility] since these medications are less than $10. Is amenable to starting insulin in the future if needed, but would like to meet with her PCP in order to start this.  Health Maintenance:  Health Maintenance Due  Topic Date Due  . OPHTHALMOLOGY EXAM  10/21/1976  . HIV Screening  10/21/1981  . COLONOSCOPY  10/21/2016    -  reports that she has never smoked. She has never used smokeless tobacco. - Review of Systems: Per HPI. - Past Medical History:  Patient Active Problem List   Diagnosis Date Noted  . Felon of finger 02/11/2019  . Chronic pain of right thumb 11/13/2018  . Lightheadedness 09/24/2018  . Skin inflammation 08/13/2018  . Osteoarthritis of spine with radiculopathy, cervical region 03/06/2018  . Menorrhagia with regular cycle 05/25/2017  . Anemia 10/27/2016  . BPPV (benign paroxysmal positional vertigo) 02/02/2016  . Recurrent incisional hernia with incarceration 01/22/2016  . Recurrent ventral incisional hernia with obstruction 01/04/2016  . Onychomycosis 02/20/2015  . Trigger finger, acquired 04/25/2013  . Fatigue 04/24/2013  . Status post tooth extraction 11/21/2012  . Plantar fasciitis of right foot 07/31/2012  . Multiple joint pain 04/11/2012  . Lipoma 12/08/2011  . Irregular menstrual cycle 09/28/2011  . Leg length discrepancy 09/27/2011  . Pes planus 09/27/2011  . 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 Umbilical hernia 62/22/9798  . Sciatica of right side 04/18/2011  . GERD 04/08/2009  . Hyperlipidemia 03/20/2009  . UNSPECIFIED HEARING LOSS 12/31/2008  . Diabetes mellitus type 2 in obese ([place]) 01/17/2007  . DIZZINESS, CHRONIC 01/17/2007  . OBESITY, NOS 12/28/2006  . Iron deficiency anemia due to chronic blood loss 12/28/2006  . ANXIETY 12/28/2006  . HYPERTENSION, BENIGN SYSTEMIC 12/28/2006   - Medications: reviewed and updated   Objective:   Physical Exam BP 112/62   Pulse 72   Temp 98.3 F (36.8 C) (Oral)   Ht 5\' 5"  (1.651 m)  Wt 234 lb 3.2 oz (106.2 kg)   SpO2 98%   BMI 38.97 kg/m  Gen: NAD, alert, cooperative with exam, well-appearing, pleasant Resp: No cough, non-labored Skin: Collection of pus underneath her right thumbnail on the pad of her right thumb without any streaking erythema.  Interphalangeal joint and metacarpophalangeal joints of right thumb are nontender to palpation and are not swollen.  She has normal range of motion of her right hand. Psych: good insight, alert and oriented         Assessment & Plan:   Felon of finger Abscess easily visualized on the pad of patient's right thumb.  After consent was provided, patient's thumb was cleaned with alcohol, a cool spray was applied, and an 18-gauge needle was used to incise and drain the abscess.  Copious yellow pus was drained and a sample was collected for culture.  After antibiotic ointment and a bandage were applied to the area, patient was given a prescription for Keflex 500 mg 3 times daily for 7 days.  A follow-up appointment was also made for her for this Friday so that improvement could be monitored.  Patient was given return precautions, including worsening pain, more collection of pus, pain in the joints of her right thumb, and streaking erythema of her thumb.  She expressed understanding.  Diabetes mellitus type 2 in obese Discussed with patient that her diabetes is uncontrolled, and patient is aware of this.  We will continue metformin 1000 mg twice daily and a new prescription for glipizide was provided since patient does not have any further refills.  Prescription for a new glucometer was also provided.  Patient understands that she will need to make an appointment with her PCP to discuss possibly starting insulin or other methods to better control her glucose.  She was also encouraged to reduce her consumption of refined carbohydrates such as tortillas, rice, and sugar sweetened beverages.  Her diabetes does put her at greater risk of infections, so follow-up provider should ensure that patient's continues to heal well; if needed, broader spectrum antibiotics can be given at that time.    Maia Breslow, M.D. 02/11/2019, 11:10 AM PGY-2, [place]

## 2019-02-11 NOTE — Addendum Note (Signed)
Addended by: Maia Breslow C on: 02/11/2019 01:33 PM   Modules accepted: Orders

## 2019-02-11 NOTE — Assessment & Plan Note (Addendum)
Discussed with patient that her diabetes is uncontrolled, and patient is aware of this.  We will continue metformin 1000 mg twice daily and a new prescription for glipizide was provided since patient does not have any further refills.  Prescription for a new glucometer was also provided.  Patient understands that she will need to make an appointment with her PCP to discuss possibly starting insulin or other methods to better control her glucose.  She was also encouraged to reduce her consumption of refined carbohydrates such as tortillas, rice, and sugar sweetened beverages.  Her diabetes does put her at greater risk of infections, so follow-up provider should ensure that patient's continues to heal well; if needed, broader spectrum antibiotics can be given at that time.

## 2019-02-11 NOTE — Patient Instructions (Signed)
Mucho gusto Sra. Meredith Maynard!  Por favor mira su dedo y llamanos si hay rojo o mas dolor.  Por favor toma el antibiotico Keflex tres veces al dia por siete dias para su infeccion.    He recetado mas glipizide y un aparato para chequear su Chief of Staff de [place].    Por favor regresa el viernes a las diez y cuarto.  [place], Dr. Shan Levans

## 2019-02-14 LAB — WOUND CULTURE

## 2019-02-15 ENCOUNTER — Encounter: Payer: Self-pay | Admitting: Family Medicine

## 2019-02-15 ENCOUNTER — Other Ambulatory Visit: Payer: Self-pay

## 2019-02-15 ENCOUNTER — Ambulatory Visit (INDEPENDENT_AMBULATORY_CARE_PROVIDER_SITE_OTHER): Payer: Self-pay | Admitting: Family Medicine

## 2019-02-15 VITALS — BP 106/62 | HR 82 | Temp 98.7°F | Wt 234.2 lb

## 2019-02-15 DIAGNOSIS — L03019 Cellulitis of unspecified finger: Secondary | ICD-10-CM

## 2019-02-15 NOTE — Patient Instructions (Signed)
Your thumb looks like it is healing well. Keep taking your antibiotics until you finish the bottle. Protect your thumb with the bulky dressings until you finish the antibiotics or the pain in the thumb goes away completely, which ever is longer.  You may remove the dressing to wash, but please re-wrap afterwards.   Please return to our office next Thursday at 10:50 am to see if any pus has come back while off the antibiotics.      Infeccin en la punta de los dedos (Fingertip Infection) Existen dos tipos principales de infecciones en la punta de los dedos:  Paroniquia. Se trata de una infeccin que se presenta alrededor de la ua. Este tipo de infeccin puede comenzar de Geographical information systems officer repentina en una ua o manifestarse con el paso del [place], y Print production planner a ms de una ua. La paroniquia a Barrister's clerk (crnica) puede engrosar y Adult nurse las uas de las [place].  Panadizo. Se trata de una infeccin bacteriana en la yema del dedo. Adems, el panadizo puede causar la formacin de acumulacin dolorosa de pus (absceso) dentro de la punta del dedo. Si la infeccin no se trata, se puede diseminar hacia las capas profundas y llegar al hueso. CAUSAS La paroniquia puede deberse a bacterias, a hongos o a una combinacin de ambos. Por lo general, la causa de los panadizos son las bacterias que se encuentran normalmente en la piel. Puede presentarse una infeccin si las bacterias se diseminan por la piel hasta llegar a la capa de tejido en el interior de la punta del dedo. FACTORES DE RIESGO Es ms probable que las siguientes personas tengan una infeccin en la punta de los dedos:  Las que tienen diabetes.  Las que tienen debilitado el sistema de defensa del organismo (sistema inmunitario).  Las que trabajan con las [place].  Aquellas cuyas manos estn expuestas a humedad, a sustancias qumicas o a agentes irritantes [employer].  Las que tienen mala circulacin.  Las que se muerden, se comen o se  hurgan las uas de las [place]. SNTOMAS Los sntomas de la paroniquia pueden Print production planner a Ardelia Mems o a ms uas de [employer], e incluir lo siguiente:  Social research officer, government, hinchazn y enrojecimiento alrededor de la ua.  Bolsas llenas de pus en la base o a los costados de la ua (cutcula).  Uas gruesas que se separan del lecho ungueal.  Supuracin de pus del lecho ungueal. Los sntomas del panadizo suelen afectar solamente a la yema de un dedo e incluyen lo siguiente:  Dolor intenso, punzante.  Enrojecimiento.  Hinchazn.  Calor.  Dolor a la palpacin de la punta del dedo afectado. DIAGNSTICO La infeccin en la punta de los dedos se diagnostica en funcin de la historia clnica y de un examen fsico. Si hay supuracin de pus de la infeccin, es posible que le tomen una [place] con un hisopo y la enven al laboratorio para Optometrist un cultivo. Tal vez le hagan una radiografa para determinar si la infeccin se ha diseminado al [employer]. [place] una infeccin en la punta de los dedos puede incluir lo siguiente:  Poner el dedo en remojo en agua tibia o en agua con sal varias veces al da.  Antibiticos. Pueden ser ungentos o comprimidos.  Ungento con corticoides.  Comprimidos antimicticos.  Drenaje de las bolsas de pus. Este procedimiento se realiza por medio de un corte quirrgico (incisin) para abrir la punta del dedo y extraer el pus.  Usar guantes para protegerse las uas.  [place] o aplquese los medicamentos de venta libre y recetados solamente como se lo haya indicado el mdico.  Si le recetaron antibiticos, tmelos o aplqueselos como se lo haya indicado el mdico. No deje de usar el antibitico aunque la afeccin mejore. Cuidados de la herida  Limpie la zona de la infeccin [employer] con agua tibia o agua con sal, o como se lo haya indicado el mdico. ? Lave suavemente la zona de la infeccin con agua y  un jabn neutro. ? Enjuague la zona de la infeccin con agua para quitar todo el [place]. ? Seque la zona de la infeccin dando golpecitos con una toalla limpia. No la frote. ? Para preparar la mezcla de agua con sal, disuelva totalmente de media a 1cucharadita de sal en 1taza de agua tibia.  Siga las indicaciones del mdico acerca de lo siguiente: ? Cmo cuidar la infeccin. ? Cmo y cundo cambiar las vendas (vendaje). ? Cundo retirar el vendaje.  Controle todos los das la zona infectada para detectar ms signos de infeccin. Est atento a lo siguiente: ? Aumento del enrojecimiento, la hinchazn o Conservation officer, historic buildings. ? Ms lquido Delorise Shiner. ? Calor. ? [employer]. Instrucciones generales  Mantenga el vendaje seco hasta que el mdico le diga que se lo puede quitar. No tome baos de inmersin, no nade, no use el jacuzzi ni haga ninguna actividad en la que la herida quede debajo del agua hasta que el mdico lo autorice.  Cuando est sentado o acostado, mantenga elevada la zona de la infeccin por encima del nivel del corazn, o como se lo haya indicado el mdico.  No se rasque ni se toque la infeccin.  Use guantes como se lo haya indicado el mdico, si corresponde.  Concurra a todas las visitas de control como se lo haya indicado el mdico. Esto es importante. PREVENCIN  Use guantes cuando trabaje con las manos.  Lvese frecuentemente las manos con jabn antibacterial.  Evite tener las manos hmedas o irritadas durante [place].  No se coma las uas. No se arranque las cutculas. No se chupe los dedos.  Crtese las uas con tijeras o alicates limpios. No se deje las uas muy cortas. SOLICITE ATENCIN MDICA SI:  El medicamento no Production designer, theatre/television/film.  Aumentan el enrojecimiento, la hinchazn o Conservation officer, historic buildings alrededor de la punta del dedo.  Sigue teniendo secrecin de lquido, sangre o pus de la punta del dedo.  La zona de la infeccin est caliente al tacto.  Sigue percibiendo que  sale mal olor de la punta del dedo o del vendaje. SOLICITE ATENCIN MDICA DE INMEDIATO SI:  La zona de enrojecimiento se est extendiendo u observa una lnea roja que sale de la punta del dedo.  Tiene fiebre. Esta informacin no tiene Marine scientist el consejo del mdico. Asegrese de hacerle al mdico cualquier pregunta que tenga. Document Released: 10/17/2005 Document Revised: 02/08/2016 Document Reviewed: 04/06/2015 Elsevier Interactive Patient Education  2019 [employer].

## 2019-02-15 NOTE — Progress Notes (Signed)
   Subjective:    Patient ID: Meredith Maynard, female    DOB: January 11, 1966, 53 y.o.   MRN: [id number] Meredith Maynard is alone Sources of clinical information for visit is/are patient and past medical records. Nursing assessment for this office visit was reviewed with the patient for accuracy and revision.   Previous Report(s) Reviewed: historical medical records  Depression screen [facility] 2/9 02/15/2019  Decreased Interest 0  Down, Depressed, Hopeless 0  PHQ - 2 Score 0  Some recent data might be hidden   Fall Risk  02/15/2019 02/11/2019 08/27/2018 08/13/2018 06/05/2018  Falls in the past year? 0 0 No No No    History/P.E. limitations: communication barrier Language  Interview conducted with Spanish interpreter via [employer].   Adult vaccines due  Topic Date Due  . TETANUS/TDAP  09/07/2026  . PNEUMOCOCCAL POLYSACCHARIDE VACCINE AGE 19-64 HIGH RISK  Completed    Diabetes Health Maintenance Due  Topic Date Due  . OPHTHALMOLOGY EXAM  10/21/1976  . HEMOGLOBIN A1C  07/13/2019  . FOOT EXAM  08/14/2019    Health Maintenance Due  Topic Date Due  . OPHTHALMOLOGY EXAM  10/21/1976  . HIV Screening  10/21/1981  . COLONOSCOPY  10/21/2016     Chief Complaint  Patient presents with  . Other    Felon S/P I&D recheck     HPI  Felon S/P I&D - Right handed - Onset right thumb pad injury 02/04/19 after compression injury in closing of a car door on thumb.  - Developed throbbing pain in thumb, Dx with Felon (infection of thumb pad) by Dr Vergie Living on 02/11/19 . Treated successfully with Needle I&D followed by oral cephalexin for 7 days . Drainage sent for cx: grew on mixed skin flora including GPR, GNR, and GPC. No MRSA.  - Pain in thumb much reduced after I&D.  Throbbing resolved.  Only minor pain at distal tip of right thumb.   - Able to perform housework using right hand without difficulty now. - No fever/chills.  No drainage from incision site.    Review of Systems    Objective:   Physical Exam Vitals:   02/15/19 1013  BP: 106/62  Pulse: 82  Temp: 98.7 F (37.1 C)  SpO2: 97%   Gen: NAD Right Hand: thumb without erythema, edema, nor incr warmth.  Minimal tenderness to palpation of very distal tip of thumb.  No expressible discharge.  No drainage.  No obvious fluctuant site.  Able to palpate distal thumb pad without tenderness.  Full extension and flexion of thumb IP joint without pain.     Assessment & Plan:  Visit Problem List with A/P  Felon of finger A/ POD #4 I&D thumb pad abscess of mixed skin flora.  Clinically much improved with return of usual function with minimal discomfort.  No current evidence of inflammation-infection.   Plan Complete remaining 3-4 days of cephalexin oral  Bulky dressing to thumb to prevent re-injury, continue until complete anbiotics or pain resolves, whichever is longer.  RTC 3 days after completion of cephalexin to monitor for return of infection from other currently senescent, infected thumb pad septae.  If no return of abscesses, then release from monitoring.  If return of abscess, recommend urgent referral to hand surgeon for definitive surgical treatment of thumb pad infection.

## 2019-02-15 NOTE — Assessment & Plan Note (Signed)
A/ POD #4 I&D thumb pad abscess of mixed skin flora.  Clinically much improved with return of usual function with minimal discomfort.  No current evidence of inflammation-infection.   Plan Complete remaining 3-4 days of cephalexin oral  Bulky dressing to thumb to prevent re-injury, continue until complete anbiotics or pain resolves, whichever is longer.  RTC 3 days after completion of cephalexin to monitor for return of infection from other currently senescent, infected thumb pad septae.  If no return of abscesses, then release from monitoring.  If return of abscess, recommend urgent referral to hand surgeon for definitive surgical treatment of thumb pad infection.

## 2019-02-21 ENCOUNTER — Other Ambulatory Visit: Payer: Self-pay

## 2019-02-21 ENCOUNTER — Ambulatory Visit (INDEPENDENT_AMBULATORY_CARE_PROVIDER_SITE_OTHER): Payer: Self-pay | Admitting: Family Medicine

## 2019-02-21 VITALS — BP 102/62 | HR 73 | Temp 98.1°F | Ht 65.0 in | Wt 234.0 lb

## 2019-02-21 DIAGNOSIS — L03019 Cellulitis of unspecified finger: Secondary | ICD-10-CM

## 2019-02-21 NOTE — Patient Instructions (Addendum)
La infeccin del pulgar derecho se ve mucho mejor. Mientras no tengas dolor, enrojecimiento, hinchazn o notar que el pus drene de la zona, no necesitas hacer un seguimiento. Si usted nota CUALQUIERA de estas cosas por favor llame a nuestra oficina INMEDIATAMENTE. Qudate en casa y [place] a salvo!  Harolyn Rutherford, DO [facility], PGY-2  Lesin del lecho ungueal Nail Bed Injury El lecho ungueal es el tejido blando que se encuentra debajo de las uas de las manos o de los pies. Se puede lastimar (lesionarse) de [employer]. Es posible que le suceda lo siguiente:  Marin Comment pueden aparecer hematomas o hemorragias debajo de la ua.  Puede haber cortes en la ua o en el lecho ungueal.  Puede perder Ardelia Mems parte de la ua o la ua completa. Despus de que se lastima o se rompe el lecho ungueal, pueden pasar varios meses hasta que vuelve a crecer. Es posible que la ua no vuelva a Arboriculturist. Siga estas indicaciones en su casa: Control del dolor, la rigidez y la hinchazn  Cuando est sentado o acostado, alce (eleve) la zona de la lesin por encima del nivel del corazn.  Proteja la lesin con vendas (vendajes) o con frulas, segn le indique el [place] vendas limpias y secas. Renae Gloss o retire las vendas tal como se lo haya indicado el mdico.  Si tiene lesionada una ua del pie: ? Trate de no caminar con la pierna lesionada. ? Use calzado sin puntera para caminar. ? Intente no dejar caer (colgar) la pierna cuando est sentado o acostado. Instrucciones generales   [employer] de venta libre y los recetados solamente como se lo haya indicado el mdico.  Si le recetaron un antibitico, tmelo como se lo haya indicado el mdico. No deje de usar el antibitico aunque comience a Sports administrator.  No conduzca ni use maquinaria pesada mientras toma analgsicos recetados.  Concurra a todas las visitas de seguimiento como se lo haya indicado el mdico. Esto es  importante. Comunquese con un mdico si:  Los medicamentos no Forensic psychologist.  Si tiene alguno de [employer] lugar de la lesin: ? Aumenta el dolor. ? Ms secrecin (drenaje). ? Ms Almon [place].  El lugar de la lesin est rojo, se le hincha o le duele.  Tiene fiebre, y los sntomas empeoran. Solicite ayuda de inmediato si:  Pierde la sensibilidad (adormecimiento) en el dedo de la mano o del pie.  El dedo de la mano o del pie est [place]. Resumen  El lecho ungueal es el tejido blando que se encuentra debajo de las uas de las manos o de los pies. Este Chiropractor.  A veces, despus de que se lastima una ua o un lecho ungueal, es posible que la ua no vuelva a crecer normalmente.  Mantenga el lugar de la lesin limpio y [place] o retire las vendas (vendajes) solamente como se lo haya indicado el mdico.  Si le recetaron un antibitico, tmelo como se lo haya indicado el mdico. No deje de usar el antibitico aunque comience a Sports administrator. Esta informacin no tiene Marine scientist el consejo del mdico. Asegrese de hacerle al mdico cualquier pregunta que tenga. Document Released: 02/11/2013 Document Revised: 07/17/2017 Document Reviewed: 07/17/2017 Elsevier Interactive Patient Education  2019 [employer].

## 2019-02-21 NOTE — Progress Notes (Signed)
     Subjective: Chief Complaint  Patient presents with  . Follow-up    finger    HPI: Meredith Maynard is a 53 y.o. presenting to clinic today to discuss the following:  Felon of Right Thumb Patient has finished her antibiotics of 7 days of Keflex. She is day 10 s/p I&D of the abcess on her right thumb pad that grew mixed skin flora. She still has some pain but it is much improved with no signs of return of abcsess or skin infection today.   She denies fever, chills, nausea, vomiting.     ROS noted in HPI.   Past Medical, Surgical, Social, and Family History Reviewed & Updated per EMR.   Pertinent Historical Findings include:   Social History   Tobacco Use  Smoking Status Never Smoker  Smokeless Tobacco Never Used   Objective: BP 102/62   Pulse 73   Temp 98.1 F (36.7 C) (Oral)   Ht 5\' 5"  (1.651 m)   Wt 234 lb (106.1 kg)   SpO2 98%   BMI 38.94 kg/m  Vitals and nursing notes reviewed  Physical Exam Gen: Alert and Oriented x 3, NAD HEENT: Normocephalic, atraumatic CV: RRR, no murmurs, normal S1, S2 split Resp: CTAB, no wheezing, rales, or rhonchi, comfortable work of breathing Ext: no clubbing, cyanosis, or edema        Right Thumb: no erythema or swelling, still some TTP at the tip of the thumb pad, normal ROM with non-painful movement and full grasp strength    Skin: warm, dry, intact, no rashes  Assessment/Plan:  Felon of finger Completed antibiotic Cephalaxin s/p I&D #10. No signs of infection or abscess. No erythema, swelling, or drainage.  - No further follow up needed unless pain, swelling, redness return.   PATIENT EDUCATION PROVIDED: See AVS    Diagnosis and plan along with any newly prescribed medication(s) were discussed in detail with this patient today. The patient verbalized understanding and agreed with the plan. Patient advised if symptoms worsen return to clinic or ER.    Harolyn Rutherford, DO 02/21/2019, 10:55 AM PGY-2 [place]

## 2019-02-28 NOTE — Assessment & Plan Note (Signed)
Completed antibiotic Cephalaxin s/p I&D #10. No signs of infection or abscess. No erythema, swelling, or drainage.  - No further follow up needed unless pain, swelling, redness return.

## 2019-05-08 ENCOUNTER — Other Ambulatory Visit: Payer: Self-pay

## 2019-05-08 ENCOUNTER — Telehealth (INDEPENDENT_AMBULATORY_CARE_PROVIDER_SITE_OTHER): Payer: Self-pay | Admitting: Family Medicine

## 2019-05-08 DIAGNOSIS — T7840XA Allergy, unspecified, initial encounter: Secondary | ICD-10-CM

## 2019-05-08 MED ORDER — FLUTICASONE PROPIONATE 50 MCG/ACT NA SUSP: 2.0000 | Freq: Every day | NASAL | 6 refills | Status: DC

## 2019-05-08 MED ORDER — CETIRIZINE HCL 10 MG PO TABS: 10.0000 mg | ORAL_TABLET | Freq: Every day | ORAL | 11 refills | Status: DC

## 2019-05-08 NOTE — Progress Notes (Signed)
[facility] Telemedicine Visit  Patient consented to have virtual visit. Method of visit: Telephone  Encounter participants: Patient: [place] Mor[place]Cont[place] - located at home Provider: Daisy Floro - located at work from home Others (if applicable): [place] #[id number]  Chief Complaint: phlegm, cough, hearing heartbeat in the left ear  HPI: Is feeling a heart beat in her left ear, no fever cough or flu symptoms, but at 5am she has felt this 3 days in a row. She has a lot of phlegm in her throat, this is making her worried. In her left ear she is feeling her heart beat and doing gargles to help her throat have less pain.   When I was very little (a teenager) and in hot weather her tonsils would become swollen especially in the sun or the heat.  No sneezing or runny nose.  No fevers, headaches, cough, or body aches.   ROS: per HPI  Pertinent PMHx: HTN, Meniere's Disease, T2DM  Exam:  Respiratory: speaking in full sentences  Assessment/Plan:  - Patient prescribed fluticasone nasal spray and cetirizine to help with tonsil swelling and ear symptoms  Time spent during visit with patient: 25:16 minutes  Milus Banister, [place], PGY-2 05/08/2019 3:56 PM

## 2019-05-29 ENCOUNTER — Encounter: Payer: Self-pay | Admitting: Family Medicine

## 2019-05-29 ENCOUNTER — Ambulatory Visit (INDEPENDENT_AMBULATORY_CARE_PROVIDER_SITE_OTHER): Payer: Self-pay | Admitting: Family Medicine

## 2019-05-29 ENCOUNTER — Other Ambulatory Visit: Payer: Self-pay

## 2019-05-29 VITALS — BP 110/62 | HR 87 | Wt 240.0 lb

## 2019-05-29 DIAGNOSIS — E1169 Type 2 diabetes mellitus with other specified complication: Secondary | ICD-10-CM

## 2019-05-29 DIAGNOSIS — R42 Dizziness and giddiness: Secondary | ICD-10-CM

## 2019-05-29 DIAGNOSIS — E669 Obesity, unspecified: Secondary | ICD-10-CM

## 2019-05-29 DIAGNOSIS — H8110 Benign paroxysmal vertigo, unspecified ear: Secondary | ICD-10-CM

## 2019-05-29 LAB — POCT GLYCOSYLATED HEMOGLOBIN (HGB A1C): HbA1c, POC (controlled diabetic range): 10 % — AB (ref 0.0–7.0)

## 2019-05-29 MED ORDER — MECLIZINE HCL 12.5 MG PO TABS: 12.5000 mg | ORAL_TABLET | Freq: Three times a day (TID) | ORAL | 0 refills | Status: DC | PRN

## 2019-05-29 NOTE — Assessment & Plan Note (Addendum)
Patient's A1c 10% today, patient was informed she will need to start insulin. -Continue glipizide 5 mg twice daily and metformin 1000 mg twice daily. -Patient will be seen in clinic 06/06/2019 for insulin education.

## 2019-05-29 NOTE — Patient Instructions (Addendum)
Meclizine 12.5mg  para Vertigo!  Diabetes mellitus y nutricin, en adultos Diabetes Mellitus and Nutrition, Adult Si sufre de diabetes (diabetes mellitus), es muy importante tener hbitos alimenticios saludables debido a que sus niveles de Designer, television/film set sangre (glucosa) se ven afectados en gran medida por lo que come y bebe. [place] alimentos saludables en las cantidades [place], aproximadamente a la [employer], [place] ayudar a:  Aeronautical engineer glucemia.  Disminuir el riesgo de sufrir una enfermedad cardaca.  Mejorar la presin arterial.  Science writer o mantener un peso saludable. Todas las personas que sufren de diabetes son diferentes y cada una tiene necesidades diferentes en cuanto a un plan de alimentacin. El mdico puede recomendarle que trabaje con un especialista en dietas y nutricin (nutricionista) para Financial trader plan para usted. Su plan de alimentacin puede variar segn factores como:  Las caloras que necesita.  Los medicamentos que toma.  Su peso.  Sus niveles de glucemia, presin arterial y colesterol.  Su nivel de [place].  Otras afecciones que tenga, como enfermedades cardacas o renales. Cmo me afectan los carbohidratos? Los carbohidratos, o hidratos de carbono, afectan su nivel de glucemia ms que cualquier otro tipo de alimento. La ingesta de carbohidratos naturalmente aumenta la cantidad de [employer]. El recuento de carbohidratos es un mtodo destinado a Catering manager un registro de la cantidad de carbohidratos que se consumen. El recuento de carbohidratos es importante para Theatre manager la glucemia a un nivel saludable, especialmente si utiliza insulina o toma determinados medicamentos por va oral para la diabetes. Es importante conocer la cantidad de carbohidratos que se pueden ingerir en cada comida sin correr Engineer, manufacturing. Esto es Psychologist, forensic. Su nutricionista puede ayudarlo a calcular la cantidad de carbohidratos que debe ingerir  en cada comida y en cada refrigerio. Entre los alimentos que contienen carbohidratos, se incluyen:  Pan, cereal, arroz, pastas y galletas.  Papas y maz.  Guisantes, frijoles y lentejas.  Leche y Estate agent.  Lambert Mody y [place].  Postres, como pasteles, galletas, helado y caramelos. Cmo me afecta el alcohol? El alcohol puede provocar disminuciones sbitas de la glucemia (hipoglucemia), especialmente si utiliza insulina o toma determinados medicamentos por va oral para la diabetes. La hipoglucemia es una afeccin potencialmente mortal. Los sntomas de la hipoglucemia (somnolencia, mareos y confusin) son similares a los sntomas de haber consumido demasiado alcohol. Si el mdico afirma que el alcohol es seguro para usted, [place] estas pautas:  Limite el consumo de alcohol a no ms de 4medida por da si es mujer y no est [place], y a 1medidas si es hombre. Una medida equivale a 12oz (323ml) de cerveza, 5oz (178ml) de vino o 1oz (31ml) de bebidas alcohlicas de alta graduacin.  No beba con el estmago vaco.  Mantngase hidratado bebiendo agua, refrescos dietticos o t helado sin azcar.  Tenga en cuenta que los refrescos comunes, los jugos y otras bebida para Optician, dispensing pueden contener mucha azcar y se deben contar como carbohidratos. Cules son algunos consejos para seguir este plan?  Leer las etiquetas de los alimentos  Comience por leer el tamao de la porcin en la "Informacin nutricional" en las etiquetas de los alimentos envasados y las bebidas. La cantidad de caloras, carbohidratos, grasas y otros nutrientes mencionados en la etiqueta se basan en una porcin del alimento. Muchos alimentos contienen ms de una porcin por envase.  Verifique la cantidad total de gramos (g) de carbohidratos totales en una porcin. Puede calcular la cantidad de porciones  de carbohidratos al dividir el total de carbohidratos por 15. Por ejemplo, si un alimento tiene un total de 30g de  carbohidratos, equivale a 2 porciones de carbohidratos.  Verifique la cantidad de gramos (g) de grasas saturadas y grasas trans en una porcin. Escoja alimentos que no contengan grasa o que tengan un bajo contenido.  Verifique la cantidad de miligramos (mg) de sal (sodio) en una porcin. La mayora de las personas deben limitar la ingesta de sodio total a menos de 2300mg  por Training and development officer.  Siempre consulte la informacin nutricional de los alimentos etiquetados como "con bajo contenido de grasa" o "sin grasa". Estos alimentos pueden tener un mayor contenido de Location manager agregada o carbohidratos refinados, y deben evitarse.  Hable con su nutricionista para identificar sus objetivos diarios en cuanto a los nutrientes mencionados en la etiqueta. Al ir de compras  Evite comprar alimentos procesados, enlatados o precocinados. Estos alimentos tienden a Special educational needs teacher mayor cantidad de [place], sodio y azcar agregada.  Compre en la zona exterior de la tienda de comestibles. Esta zona incluye frutas y verduras frescas, granos a granel, carnes frescas y productos lcteos frescos. Al cocinar  Utilice mtodos de coccin a baja temperatura, como hornear, en lugar de mtodos de coccin a alta temperatura, como frer en abundante aceite.  Cocine con aceites saludables, como el aceite de [place], canola o [place].  Evite cocinar con manteca, crema o carnes con alto contenido de grasa. Planificacin de las comidas  Coma las comidas y los refrigerios regularmente, preferentemente a la misma hora todos [place]. Evite pasar largos perodos de tiempo sin comer.  Consuma alimentos ricos en fibra, como frutas frescas, verduras, frijoles y cereales integrales. Consulte a su nutricionista sobre cuntas porciones de carbohidratos puede consumir en cada comida.  Consuma entre 4 y 6 onzas (oz) de protenas magras por da, como carnes [place], pollo, pescado, huevos o tofu. Una onza de protena magra equivale a: ? 1 onza de carne,  pollo o pescado. ? 1huevo. ?  taza de tofu.  Coma algunos alimentos por da que contengan grasas saludables, como aguacates, frutos secos, semillas y pescado. Estilo de vida  Controle su nivel de glucemia con regularidad.  Haga actividad fsica habitualmente como se lo haya indicado el mdico. Esto puede incluir lo siguiente: ? 164minutos semanales de ejercicio de intensidad moderada o alta. Esto podra incluir caminatas dinmicas, ciclismo o gimnasia acutica. ? Realizar ejercicios de elongacin y de fortalecimiento, como yoga o levantamiento de pesas, por lo menos 2veces por semana.  Tome los [employer] se lo haya indicado el mdico.  No consuma ningn producto que contenga nicotina o tabaco, como cigarrillos y Psychologist, sport and exercise. Si necesita ayuda para dejar de fumar, consulte al [employer] con un asesor o instructor en diabetes para identificar estrategias para controlar el estrs y cualquier desafo emocional y social. Preguntas para hacerle al mdico  Es necesario que consulte a Radio broadcast assistant en el cuidado de la diabetes?  Es necesario que me rena con un nutricionista?  A qu nmero puedo llamar si tengo preguntas?  Cules son los mejores momentos para controlar la glucemia? Dnde encontrar ms informacin:  Asociacin Estadounidense de la Diabetes (American Diabetes Association): diabetes.org  Academia de Nutricin y Information systems manager (Academy of Nutrition and Dietetics): www.eatright.[facility] Diabetes y las Enfermedades Digestivas y Ren[place] [facility] of Diabetes and Digestive and Kidney Diseases, NIH): [employer].dk Resumen  Un plan de alimentacin saludable lo ayudar a Aeronautical engineer glucemia y Theatre manager  un estilo de vida saludable.  Trabajar con un especialista en dietas y nutricin (nutricionista) puede ayudarlo a Insurance claims handler de alimentacin para usted.  Tenga en cuenta que los carbohidratos (hidratos  de carbono) y el alcohol tienen efectos inmediatos en sus niveles de glucemia. Es importante contar los carbohidratos que ingiere y consumir alcohol con prudencia. Esta informacin no tiene Marine scientist el consejo del mdico. Asegrese de hacerle al mdico cualquier pregunta que tenga. Document Released: 01/24/2008 Document Revised: 06/27/2017 Document Reviewed: 02/06/2017 Elsevier Patient Education  2020 [employer].

## 2019-05-29 NOTE — Progress Notes (Signed)
   Subjective:    Patient ID: Meredith Maynard, female    DOB: 1965-11-21, 53 y.o.   MRN: [id number]  [facility] #[id number]  CC: Uncontrolled diabetes, dizziness  HPI: Diabetes: Patient reports that Sunday evening her blood sugar was high (couldn't recall what it was).  At the time she had a headache and felt some minor chest discomfort, but no shortness of breath, or abdominal pain.  She took her metformin and went for a walk and everything got better.  She reports she had eaten something sweet that day, which she knows she is not supposed to eat.  Dizziness: patient has had the sensation of the room spinning, primarily at night, since 3 days. She is having a little bit of nausea. Turning in bed makes it worse.  She has had the symptoms one time before.  She has not tried anything to make the symptoms better or worse.  She also denies runny nose, watery eyes, cough, sneezing, fevers, and malaise.  She says the dizziness does not make her feel like passing out.  Smoking status reviewed: non-smoker  Review of Systems: See HPI   Objective:  BP 110/62   Pulse 87   Wt 240 lb (108.9 kg)   LMP 05/24/2019   SpO2 98%   BMI 39.94 kg/m  Vitals and nursing note reviewed  General: well nourished, in no acute distress, pleasant patient HEENT: TM's visualized bilaterally, no nasal discharge, good dentition without erythema or discharge noted in posterior oropharynx Neck: supple, non-tender, without lymphadenopathy Cardiac: RRR, clear S1 and S2, no murmurs Respiratory: clear to auscultation bilaterally, no increased work of breathing Abdomen: soft, nontender, nondistended, no masses or organomegaly. Bowel sounds present Extremities: no edema or cyanosis. Warm, well perfused. 2+ radial and PT pulses bilaterally Skin: warm and dry, no rashes noted Neuro: alert and oriented, no focal deficits  Assessment & Plan:   Diabetes mellitus type 2 in obese ([place]) Patient's A1c 10.4%  today, patient was informed she will need to start insulin. -Continue glipizide 5 mg twice daily and metformin 1000 mg twice daily. -Patient will be seen in clinic 06/06/2019 for insulin education.  BPPV (benign paroxysmal positional vertigo) Dizziness most likely due to uncontrolled diabetes, the patient has had vertigo in the past.  Episode seems to be acute on chronic phenomenon. -Patient given meclizine 12.5 mg to be taken at night as needed, or up to 3 times daily as needed.  Patient informed medicine can cause her to feel sleepy. -Will follow-up on 06/06/2019.   Return in about 2 weeks (around 06/12/2019) for F/u Vertigo.   Dr. Milus Banister [facility], PGY-2

## 2019-05-29 NOTE — Assessment & Plan Note (Signed)
Dizziness most likely due to uncontrolled diabetes, the patient has had vertigo in the past.  Episode seems to be acute on chronic phenomenon. -Patient given meclizine 12.5 mg to be taken at night as needed, or up to 3 times daily as needed.  Patient informed medicine can cause her to feel sleepy. -Will follow-up on 06/06/2019.

## 2019-06-03 ENCOUNTER — Other Ambulatory Visit: Payer: Self-pay

## 2019-06-03 ENCOUNTER — Ambulatory Visit (INDEPENDENT_AMBULATORY_CARE_PROVIDER_SITE_OTHER): Payer: Self-pay | Admitting: Family Medicine

## 2019-06-03 ENCOUNTER — Encounter: Payer: Self-pay | Admitting: Family Medicine

## 2019-06-03 VITALS — BP 130/72 | HR 81

## 2019-06-03 DIAGNOSIS — E669 Obesity, unspecified: Secondary | ICD-10-CM

## 2019-06-03 DIAGNOSIS — E1169 Type 2 diabetes mellitus with other specified complication: Secondary | ICD-10-CM

## 2019-06-03 DIAGNOSIS — R202 Paresthesia of skin: Secondary | ICD-10-CM | POA: Insufficient documentation

## 2019-06-03 DIAGNOSIS — H8110 Benign paroxysmal vertigo, unspecified ear: Secondary | ICD-10-CM

## 2019-06-03 MED ORDER — MECLIZINE HCL 12.5 MG PO TABS: 12.5000 mg | ORAL_TABLET | Freq: Three times a day (TID) | ORAL | 0 refills | Status: DC | PRN

## 2019-06-03 NOTE — Assessment & Plan Note (Addendum)
Exam consistent with BPPV.  Reassured that patient's neurological exam is intact and she is without focal deficit.  Given improvement with meclizine, will continue meclizine.  Patient advised that this can be somewhat sedating.  She was given appropriate return precautions including focal weakness, new visual disturbances, slurring of speech, facial droop.  She voiced understanding of these precautions.  Will obtain CMP and CBC to rule out electrolyte disturbance or anemia.  Precepted with Dr. Erin Hearing.

## 2019-06-03 NOTE — Assessment & Plan Note (Signed)
Suspect that patient's uncontrolled diabetes is likely contributing to her vertigo as well as tingling in her hands.  She is planning on following up with PCP in 3 days, therefore will defer to PCP for changes in diabetes management.  Will obtain lipid panel while getting blood work today per patient request so that these can be discussed at her follow-up appointment.

## 2019-06-03 NOTE — Assessment & Plan Note (Addendum)
2nd-5th digits on left hand, sparing palm and thumb.  Negative Tinel's and Phalen's, no evidence of carpal tunnel.  See diabetes plan.  Likely secondary to uncontrolled diabetes.  Will obtain CBC, CMP, lipid panel today.  Most recent A1c was 10.  Patient has follow-up appointment with PCP in 3 days, will defer diabetes management to this.  Return precautions discussed with patient, she voiced understanding.

## 2019-06-03 NOTE — Progress Notes (Signed)
Subjective: Chief Complaint  Patient presents with  . Dizziness   Ipad interpretor [id number]  HPI: Meredith Maynard is a 53 y.o. presenting to clinic today to discuss the following:  Dizziness and Left Arm and Leg Numbness Patient reports that she was seen on 7/29 for dizziness and diagnosed with vertigo.  She was given a prescription for meclizine at that time, which she has been taking and has been helping with her dizziness.  She states that she is still concerned about her dizziness.  She notes that dizziness is worse when she bends over or when she goes from sitting to lying down.  She notes that this is made it difficult for her to work, which is causing stress on her family.  Patient also concerned because 2 days ago, she started to have tingling in her left second through fifth digits.  She states that the tingling spares her palm and thumb.  She notes that this happens constantly throughout the day since it first started.  She also reports that last p.m., she started to have tingling in her left midfoot to toes.  She stated this is since resolved, but has caused her some worries.  She denies current headaches.  Does state that she has had occasional blurry vision, flashing lights, spots in her vision that are chronic, but are now occurring when she is dizzy.  She denies any weakness, states that she is able to walk without symptoms and without difficulty at home.  She states that she has not lost her balance.  She denies chest pain, shortness of breath, fevers, or recent illness.  Diabetes Patient's diabetes not well controlled, A1c 10 at visit on 7/29.  She has an appointment in 3 days with her PCP to discuss insulin.     ROS noted in HPI.   Past Medical, Surgical, Social, and Family History Reviewed & Updated per EMR.   Pertinent Historical Findings include:   Social History   Tobacco Use  Smoking Status Never Smoker  Smokeless Tobacco Never Used       Objective: BP 130/72   Pulse 81   LMP 05/24/2019   SpO2 97%  Vitals and nursing notes reviewed  Physical Exam:  General: 53 y.o. female in NAD HEENT: NCAT, PERRL, EOMI Cardio: RRR Lungs: Breathing comfortably on room air Skin: warm and dry Neuro: CN II through XII grossly intact, sensation grossly intact throughout with "a little less sensation in V2 distribution, 5 out of 5 strength BUE/BLE, negative Romberg, normal gait, positive Dix-Hallpike on right Extremities: Negative Tinel's at left wrist, negative Phalen's    No results found for this or any previous visit (from the past 72 hour(s)).  Assessment/Plan:  BPPV (benign paroxysmal positional vertigo) Exam consistent with BPPV.  Reassured that patient's neurological exam is intact and she is without focal deficit.  Given improvement with meclizine, will continue meclizine.  Patient advised that this can be somewhat sedating.  She was given appropriate return precautions including focal weakness, new visual disturbances, slurring of speech, facial droop.  She voiced understanding of these precautions.  Will obtain CMP and CBC to rule out electrolyte disturbance or anemia.  Precepted with Dr. Erin Hearing.  Diabetes mellitus type 2 in obese [facility]) Suspect that patient's uncontrolled diabetes is likely contributing to her vertigo as well as tingling in her hands.  She is planning on following up with PCP in 3 days, therefore will defer to PCP for changes in diabetes management.  Will obtain  lipid panel while getting blood work today per patient request so that these can be discussed at her follow-up appointment.  Paresthesia of finger 2nd-5th digits on left hand, sparing palm and thumb.  Negative Tinel's and Phalen's, no evidence of carpal tunnel.  See diabetes plan.  Likely secondary to uncontrolled diabetes.  Will obtain CBC, CMP, lipid panel today.  Most recent A1c was 10.  Patient has follow-up appointment with PCP in 3 days, will defer  diabetes management to this.  Return precautions discussed with patient, she voiced understanding.     PATIENT EDUCATION PROVIDED: See AVS    Diagnosis and plan along with any newly prescribed medication(s) were discussed in detail with this patient today. The patient verbalized understanding and agreed with the plan. Patient advised if symptoms worsen return to clinic or ER.     Orders Placed This Encounter  Procedures  . Comprehensive metabolic panel  . CBC with Differential  . Lipid Panel    Meds ordered this encounter  Medications  . meclizine (ANTIVERT) 12.5 MG tablet    Sig: Take 1 tablet (12.5 mg total) by mouth 3 (three) times daily as needed for dizziness.    Dispense:  30 tablet    Refill:  0     Ari[place] Constable, DO 06/03/2019, 1:43 PM PGY-2 [place]

## 2019-06-03 NOTE — Patient Instructions (Signed)
Thank you for coming to see me today. It was a pleasure. Today we talked about:   Your dizziness: Continue to take meclizine as needed.  We will get labs today.  Come back in three days to discuss them with Dr. Ky Barban.   If you have any questions or concerns, please do not hesitate to call the office at [phone].  Best,   Arizona Constable, DO

## 2019-06-04 LAB — COMPREHENSIVE METABOLIC PANEL
ALT: 32 IU/L (ref 0–32)
AST: 24 IU/L (ref 0–40)
Albumin/Globulin Ratio: 1.9 (ref 1.2–2.2)
Albumin: 4.1 g/dL (ref 3.8–4.9)
Alkaline Phosphatase: 110 IU/L (ref 39–117)
BUN/Creatinine Ratio: 18 (ref 9–23)
BUN: 10 mg/dL (ref 6–24)
Bilirubin Total: 0.2 mg/dL (ref 0.0–1.2)
CO2: 24 mmol/L (ref 20–29)
Calcium: 9.6 mg/dL (ref 8.7–10.2)
Chloride: 102 mmol/L (ref 96–106)
Creatinine, Ser: 0.55 mg/dL — ABNORMAL LOW (ref 0.57–1.00)
GFR calc Af Amer: 125 mL/min/{1.73_m2} (ref >59)
GFR calc non Af Amer: 108 mL/min/{1.73_m2} (ref >59)
Globulin, Total: 2.2 g/dL (ref 1.5–4.5)
Glucose: 290 mg/dL — ABNORMAL HIGH (ref 65–99)
Potassium: 4.4 mmol/L (ref 3.5–5.2)
Sodium: 141 mmol/L (ref 134–144)
Total Protein: 6.3 g/dL (ref 6.0–8.5)

## 2019-06-04 LAB — CBC WITH DIFFERENTIAL/PLATELET
Basophils Absolute: 0.1 10*3/uL (ref 0.0–0.2)
Basos: 1 %
EOS (ABSOLUTE): 0.1 10*3/uL (ref 0.0–0.4)
Eos: 2 %
Hematocrit: 37.2 % (ref 34.0–46.6)
Hemoglobin: 11.4 g/dL (ref 11.1–15.9)
Immature Grans (Abs): 0 10*3/uL (ref 0.0–0.1)
Immature Granulocytes: 0 %
Lymphocytes Absolute: 1.3 10*3/uL (ref 0.7–3.1)
Lymphs: 23 %
MCH: 24.7 pg — ABNORMAL LOW (ref 26.6–33.0)
MCHC: 30.6 g/dL — ABNORMAL LOW (ref 31.5–35.7)
MCV: 81 fL (ref 79–97)
Monocytes Absolute: 0.4 10*3/uL (ref 0.1–0.9)
Monocytes: 6 %
Neutrophils Absolute: 4 10*3/uL (ref 1.4–7.0)
Neutrophils: 68 %
Platelets: 237 10*3/uL (ref 150–450)
RBC: 4.61 x10E6/uL (ref 3.77–5.28)
RDW: 16.3 % — ABNORMAL HIGH (ref 11.7–15.4)
WBC: 5.9 10*3/uL (ref 3.4–10.8)

## 2019-06-04 LAB — LIPID PANEL
Chol/HDL Ratio: 4 ratio (ref 0.0–4.4)
Cholesterol, Total: 186 mg/dL (ref 100–199)
HDL: 46 mg/dL (ref >39)
LDL Calculated: 100 mg/dL — ABNORMAL HIGH (ref 0–99)
Triglycerides: 198 mg/dL — ABNORMAL HIGH (ref 0–149)
VLDL Cholesterol Cal: 40 mg/dL (ref 5–40)

## 2019-06-05 ENCOUNTER — Other Ambulatory Visit: Payer: Self-pay

## 2019-06-05 ENCOUNTER — Encounter: Payer: Self-pay | Admitting: Family Medicine

## 2019-06-05 ENCOUNTER — Ambulatory Visit (INDEPENDENT_AMBULATORY_CARE_PROVIDER_SITE_OTHER): Payer: Self-pay | Admitting: Family Medicine

## 2019-06-05 VITALS — BP 116/70 | HR 77 | Ht 65.0 in | Wt 239.1 lb

## 2019-06-05 DIAGNOSIS — E669 Obesity, unspecified: Secondary | ICD-10-CM

## 2019-06-05 DIAGNOSIS — E1169 Type 2 diabetes mellitus with other specified complication: Secondary | ICD-10-CM

## 2019-06-05 MED ORDER — LIRAGLUTIDE 18 MG/3ML ~~LOC~~ SOPN: 0.6000 mg | PEN_INJECTOR | Freq: Every day | SUBCUTANEOUS | 3 refills | Status: DC

## 2019-06-05 MED ORDER — ROSUVASTATIN CALCIUM 5 MG PO TABS: 5.0000 mg | ORAL_TABLET | Freq: Every day | ORAL | 3 refills | Status: DC

## 2019-06-05 NOTE — Assessment & Plan Note (Addendum)
Uncontrolled, most recent a1c 10.0 05/29/19.  Due to lower blood sugars when she takes glipizide in addition to lack of mortality benefit, will discontinue this.  Instead, will start victoza today in attempt to achieve better glycemic control as well as weight loss.  Advised patient to make appointment with pharmacy for further diabetic teaching as well as evaluation for ambulatory CGM monitoring given patient report of labile blood sugars.  We will also start rosuvastatin, patient will call if she gets muscle aches.  Referral placed for diabetic eye exam.  Follow-up in 1 month.

## 2019-06-05 NOTE — Progress Notes (Signed)
  Subjective:   Patient ID: [place]    DOB: [birth year], 53 y.o. female   MRN: [id number]  Meredith Maynard is a 53 y.o. female with a history of HTN, DM2, GERD, morbid obestiy, chronic dizziness, ventral hernia, OA here for   Diabetes, Type 2 - Last A1c 10.0 05/29/19 - Medications: glipizide 5mg  BID, metformin 1000mg  BID - Compliance: doesn't take glipizide every day. Just started back taking a few weeks ago.   - Checking BG at home: not every day, last check 175 Monday morning fasting. - Exercise: Previously walked a lot at work, has not walked a lot since the pandemic. 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 - Eye exam: due - Foot exam: UTD - Microalbumin: n/a - Statin: no, previously had muscle aches with simvastatin - reports she will sometimes have low blood sugars (~70) after she takes glipizide, will see colored lights, become shaky and sweaty.   Review of Systems:  Per HPI.  [place], medications and smoking status reviewed.  Objective:   BP 116/70   Pulse 77   Ht 5\' 5"  (1.651 m)   Wt 239 lb 2 oz (108.5 kg)   LMP 05/17/2019   SpO2 97%   BMI 39.79 kg/m  Vitals and nursing note reviewed.   General: obese female, in no acute distress with non-toxic appearance CV: regular rate, no lower extremity edema Lungs: normal work of breathing, no respiratory distress Skin: warm, dry, no rashes or lesions Extremities: warm and well perfused, normal tone MSK: ROM grossly intact, strength intact, gait normal Neuro: Alert and oriented, speech normal  Assessment & Plan:   Diabetes mellitus type 2 in obese ([facility]) Uncontrolled, most recent a1c 10.0 05/29/19.  Due to lower blood sugars when she takes glipizide in addition to lack of mortality benefit, will discontinue this.  Instead, will start victoza today in attempt to achieve better glycemic control as well as weight loss.  Advised patient to make appointment with pharmacy for further diabetic teaching as well as  evaluation for ambulatory CGM monitoring given patient report of labile blood sugars.  We will also start rosuvastatin, patient will call if she gets muscle aches.  Referral placed for diabetic eye exam.  Follow-up in 1 month.  Orders Placed This Encounter  Procedures  . Ambulatory referral to Ophthalmology    Referral Priority:   Routine    Referral Type:   Consultation    Referral Reason:   Specialty Services Required    Requested Specialty:   Ophthalmology    Number of Visits Requested:   1   Meds ordered this encounter  Medications  . rosuvastatin (CRESTOR) 5 MG tablet    Sig: Take 1 tablet (5 mg total) by mouth daily at 6 PM.    Dispense:  90 tablet    Refill:  3  . liraglutide (VICTOZA) 18 MG/3ML SOPN    Sig: Inject 0.1 mLs (0.6 mg total) into the skin daily.    Dispense:  1 pen    Refill:  [place], DO PGY-3, [facility] 06/05/2019 5:32 PM

## 2019-06-05 NOTE — Patient Instructions (Addendum)
  Estuvo muy bien verte!  Nuestros planes para hoy: - DETENER la glipizida - COMIENCE un nuevo medicamento, liraglutida (Victoza). [address]. - COMIENCE la rosuvastatina (Crestor), esto es para su colesterol. - Haga una cita con nuestro farmacutico, el Dr. Valentina Lucks, para recibir ms educacin sobre la diabetes. Es probable que Barrister's clerk use un monitor de Designer, television/film set sangre durante 58 das para tener una mejor idea de cules son sus niveles de Dispensing optician. - Seguimiento en 1 mes.  Debe prestar atencin a su hemoglobina A1C. Es una prueba de tres meses sobre su nivel promedio de Dispensing optician. Si el A1C es - <7.0 es genial. Ese es nuestro objetivo para tratarlo. - Entre 7.0 y 9.0 no es tan bueno. Necesitaramos trabajar para hacerlo mejor. - Por encima de 9.0 es terrible. Realmente necesitara trabajar con nosotros para tenerlo bajo control.  A1C ms reciente = 10.0  Tenga cuidado y busque atencin inmediata antes si desarrolla alguna inquietud.  Dr. [facility]

## 2019-06-06 ENCOUNTER — Ambulatory Visit: Payer: PRIVATE HEALTH INSURANCE | Admitting: Family Medicine

## 2019-06-13 ENCOUNTER — Encounter: Payer: Self-pay | Admitting: Pharmacist

## 2019-06-13 ENCOUNTER — Other Ambulatory Visit: Payer: Self-pay

## 2019-06-13 ENCOUNTER — Ambulatory Visit (INDEPENDENT_AMBULATORY_CARE_PROVIDER_SITE_OTHER): Payer: Self-pay | Admitting: Pharmacist

## 2019-06-13 DIAGNOSIS — E1169 Type 2 diabetes mellitus with other specified complication: Secondary | ICD-10-CM

## 2019-06-13 DIAGNOSIS — E669 Obesity, unspecified: Secondary | ICD-10-CM

## 2019-06-13 NOTE — Progress Notes (Signed)
S:     Chief Complaint  Patient presents with  . Medication Management    Diabetes    Patient arrives in good spirits and ambulating without assistance.  Her visit was conducted with use of interpreter Fabio Bering [phone]).  Presents for diabetes evaluation, education, and management.  She reports she has NOT yet taken any liraglutide (Victoza) (states she was waiting to learn more at this visit.  Patient was referred and last seen by Primary Care Provider, Rory Percy on 06/05/2019.    Patient reports Diabetes was diagnosed in 2005 during 2nd pregnancy. Metformin   Family/Social History: DM in mother and brother; Non-tobacco smoker, denies alcohol use  Insurance coverage/medication affordability:   Patient reports adherence with medications.  Current diabetes medications include: glipizide 5 mg ONCE daily Current hypertension medications include: lisinopril 20 mg daily Current hyperlipidemia medications include: Rosuvastatin 5 mg daily  Patient denies hypoglycemic events.  Patient reported dietary habits: eating less tortilla and bread, and eating more vegetables  Patient-reported exercise habits: walks a lot during her job from [place]    Patient reports nocturia. - depends on the amount of water consumed each day, wakes up 1-2 times during the night Patient reports neuropathy. Numbness in fingers and toes but dissipates when exercising  Patient reports visual changes. - cloudy vision  Patient reports self foot exams.     O:  Physical Exam Vitals signs reviewed.  Constitutional:      Appearance: Normal appearance. She is obese.  Pulmonary:     Effort: Pulmonary effort is normal.  Neurological:     Mental Status: She is alert.  Psychiatric:        Mood and Affect: Mood normal.        Behavior: Behavior normal.        Thought Content: Thought content normal.        Judgment: Judgment normal.    Review of Systems  Genitourinary: Positive for frequency.  All  other systems reviewed and are negative.    Lab Results  Component Value Date   HGBA1C 10.0 (A) 05/29/2019   There were no vitals filed for this visit.  Lipid Panel     Component Value Date/Time   CHOL 186 06/03/2019 1328   TRIG 198 (H) 06/03/2019 1328   HDL 46 06/03/2019 1328   CHOLHDL 4.0 06/03/2019 1328   CHOLHDL 3.2 10/14/2016 0835   VLDL 33 (H) 10/14/2016 0835   LDLCALC 100 (H) 06/03/2019 1328   LDLDIRECT 108 (H) 03/12/2014 1002    Home fasting CBG: 170-180s in AM 2 hour post-prandial/random CBG: 150-180s, highest has been 270s (not common)   Clinical ASCVD: No  The 10-year ASCVD risk score Mikey Bussing DC Jr., et al., 2013) is: 3.5%   Values used to calculate the score:     Age: 89 years     Sex: Female     Is Non-Hispanic African American: No     Diabetic: Yes     Tobacco smoker: No     Systolic Blood Pressure: 099 mmHg     Is BP treated: Yes     HDL Cholesterol: 46 mg/dL     Total Cholesterol: 186 mg/dL    A/P: Diabetes longstanding since 2005 currently uncontrolled with A1C 10%. Patient reports adherent with medication.  -Recommend initiating GLP-1RA Ozempic (semaglutide) - patient is hesitant on starting this medication due to side-effects; she would like to do research at home and stated she would return for a visit the  next day on Friday August 14th with final decision.  SHE DID NOT come to this follo-up visit.  -Extensively discussed pathophysiology of DM, recommended lifestyle interventions, dietary effects on glycemic control -Counseled on s/sx of and management of hypoglycemia  She is an excellent candidate for a GLP agent like Ozempic (semaglutide).  Given her non-citizen status, supply of medication long-term would be challenging.  I would be willing to consider up to 6 months of samples to assess improvement in control in this patient with suboptimal control including hypoglycemia on sulfonylurea/metformin therapy.  -Next A1C anticipated in 3 months .    Hypertension longstanding since 2005 currently 131/82.  BP goal < 130/80 mmHg. Patient reports adherent with medication.   Written patient instructions provided.  Total time in face to face counseling 30 minutes.   Follow up Pharmacist/PCP Clinic Visit with Dr. Valentina Lucks on Friday June 14, 2019. Patient seen with Murlean Iba, PharmD Candidate and Lorel Monaco, PharmD PGY-1 Resident.   As she did not return on 8/14 as requested.  She will need to schedule another appointment for our help with initiation.

## 2019-06-13 NOTE — Patient Instructions (Addendum)
Nice to meet you today.   We look forward to working with you to improve your diabetes control,.   Today, we discussed starting a new medication "Ozempic or semaglutide".  This medication is one injection once weekly.  The main side effect is mild nausea.  Most patients have very minimal sild nasuea that resolves with continued use.  Please consider this therapy strongly.  We agreed today that your blood sugar control has been suboptimal for several years.    Please continue metformin and glipizide today.   We look forward to seeing you tomorrow morning.at 8:30 AM (Patient did not return for visit on 8/14)     Un placer conocerte hoy.  Esperamos trabajar con usted para mejorar el control de su diabetes.  Hoy hablamos sobre el inicio de un nuevo medicamento "Ozempic o semaglutide". Este medicamento es una inyeccin una vez a la semana. El principal efecto secundario son las nuseas leves. La [employer] de los pacientes tienen una secrecin nasal muy mnima que se resuelve con el uso continuo. Considere seriamente esta terapia. Hoy acordamos que su control de azcar en sangre ha MCNO [id number] WSF.   Contine con metformina y glipizida hoy.   Esperamos verte maana por la maana. A las 8:30 AM

## 2019-06-14 NOTE — Progress Notes (Signed)
Patient ID: Meredith Maynard, female   DOB: 1966-03-21, 53 y.o.   MRN: [id number] Reviewed: Agree with Dr. Graylin Shiver management.

## 2019-06-14 NOTE — Assessment & Plan Note (Signed)
Diabetes longstanding since 2005 currently uncontrolled with A1C 10%. Patient reports adherent with medication.  -Recommend initiating GLP-1RA Ozempic (semaglutide) - patient is hesitant on starting this medication due to side-effects; she would like to do research at home and stated she would return for a visit the next day on Friday August 14th with final decision.  SHE DID NOT come to this follo-up visit.  -Extensively discussed pathophysiology of DM, recommended lifestyle interventions, dietary effects on glycemic control -Counseled on s/sx of and management of hypoglycemia  She is an excellent candidate for a GLP agent like Ozempic (semaglutide).  Given her non-citizen status, supply of medication long-term would be challenging.  I would be willing to consider up to 6 months of samples to assess improvement in control in this patient with suboptimal control including hypoglycemia on sulfonylurea/metformin therapy.  -Next A1C anticipated in 3 months .

## 2019-07-19 ENCOUNTER — Other Ambulatory Visit: Payer: Self-pay

## 2019-07-19 DIAGNOSIS — M4722 Other spondylosis with radiculopathy, cervical region: Secondary | ICD-10-CM

## 2019-07-19 MED ORDER — GABAPENTIN 100 MG PO CAPS: 200.0000 mg | ORAL_CAPSULE | Freq: Two times a day (BID) | ORAL | 3 refills | Status: DC

## 2019-08-19 ENCOUNTER — Other Ambulatory Visit: Payer: Self-pay

## 2019-08-19 DIAGNOSIS — Z20822 Contact with and (suspected) exposure to covid-19: Secondary | ICD-10-CM

## 2019-08-21 LAB — NOVEL CORONAVIRUS, NAA: SARS-CoV-2, NAA: NOT DETECTED

## 2019-09-03 ENCOUNTER — Other Ambulatory Visit: Payer: Self-pay | Admitting: Family Medicine

## 2019-09-03 DIAGNOSIS — H8110 Benign paroxysmal vertigo, unspecified ear: Secondary | ICD-10-CM

## 2019-09-03 MED ORDER — MECLIZINE HCL 12.5 MG PO TABS: 12.5000 mg | ORAL_TABLET | Freq: Three times a day (TID) | ORAL | 0 refills | Status: DC | PRN

## 2019-09-03 NOTE — Telephone Encounter (Signed)
Pt has made an appointment for med refill on 09/23/19 with Dr. Ky Barban. Pt will be completely out of her Meclizine tomorrow and would like to know if she could have enough refilled to last her until next appointment.   She would like to have this sent to [place] [facility].

## 2019-09-03 NOTE — Telephone Encounter (Signed)
Will forward to MD to advise. Meredith Maynard,CMA  

## 2019-09-20 NOTE — Progress Notes (Signed)
  Subjective:   Patient ID: [place]    DOB: [birth year], 53 y.o. female   MRN: [id number]  Meredith Maynard is a 53 y.o. female with a history of HTN, T2DM, GERD, morbid obesity here for   Diabetes, Type 2 - Last A1c 10.0 05/29/19 - Medications: metformin 1000mg  BID, glipizide 5mg  BID - Compliance: taking glipizide only when having higher CBGs - previously recommended GLP1 agonist but no-showed pharmacy appt, wanted to think about it.  Is very resistant to injectable medications. - Checking BG at home: yes, highest 270, lowest 130s - Eye exam: ordered - Foot exam: due - Microalbumin: due - Statin: yes - Pneumococcal vaccine: UTD - Denies symptoms of hypoglycemia, polyuria, polydipsia, numbness extremities, foot ulcers/trauma  Review of Systems:  Per HPI.  Medications and smoking status reviewed.  Objective:   BP 110/62   Pulse 75   Ht 5\' 5"  (1.651 m)   Wt 238 lb 2 oz (108 kg)   LMP 09/09/2019   SpO2 99%   BMI 39.63 kg/m  Vitals and nursing note reviewed.  General: Morbidly obese female, in no acute distress with non-toxic appearance CV: regular rate  Lungs: normal work of breathing Skin: warm, dry, no rashes or lesions Extremities: warm and well perfused, normal tone.  Foot exam normal.  Assessment & Plan:   Diabetes mellitus type 2 in obese (HCC) Uncontrolled, A1c 10.1 today.  Again discussed initiating GLP-1 agonist to aid in better control as well as weight loss, however patient is very resistant to injectable medications.  Will instead trial Jardiance.  Message sent to pharmacy team in attempts to get patient set up with PAP program.  Advised patient on weight loss through diet and exercise changes.  Foot exam performed today, within normal limits.  Changed statin due to cost.  Urine microalbumin within normal limits.  Follow-up in 1 month after starting Jardiance.  Orders Placed This Encounter  Procedures  . HIV antibody (with reflex)  .  POCT UA - Microalbumin  . HgB A1c   Meds ordered this encounter  Medications  . empagliflozin (JARDIANCE) 10 MG TABS tablet    Sig: Take 10 mg by mouth daily.    Dispense:  90 tablet    Refill:  3  . simvastatin (ZOCOR) 20 MG tablet    Sig: Take 1 tablet (20 mg total) by mouth at bedtime.    Dispense:  90 tablet    Refill:  [place], DO PGY-3, [facility] 09/23/2019 12:18 PM

## 2019-09-23 ENCOUNTER — Encounter: Payer: Self-pay | Admitting: Family Medicine

## 2019-09-23 ENCOUNTER — Ambulatory Visit (INDEPENDENT_AMBULATORY_CARE_PROVIDER_SITE_OTHER): Payer: Self-pay | Admitting: Family Medicine

## 2019-09-23 ENCOUNTER — Other Ambulatory Visit: Payer: Self-pay

## 2019-09-23 ENCOUNTER — Telehealth: Payer: Self-pay | Admitting: Pharmacist

## 2019-09-23 VITALS — BP 110/62 | HR 75 | Ht 65.0 in | Wt 238.1 lb

## 2019-09-23 DIAGNOSIS — E1169 Type 2 diabetes mellitus with other specified complication: Secondary | ICD-10-CM

## 2019-09-23 DIAGNOSIS — E669 Obesity, unspecified: Secondary | ICD-10-CM

## 2019-09-23 LAB — POCT GLYCOSYLATED HEMOGLOBIN (HGB A1C): HbA1c, POC (controlled diabetic range): 10.1 % — AB (ref 0.0–7.0)

## 2019-09-23 LAB — POCT UA - MICROALBUMIN
Albumin/Creatinine Ratio, Urine, POC: <30
Creatinine, POC: 100 mg/dL
Microalbumin Ur, POC: 30 mg/L

## 2019-09-23 MED ORDER — SIMVASTATIN 20 MG PO TABS: 20.0000 mg | ORAL_TABLET | Freq: Every day | ORAL | 3 refills | Status: DC

## 2019-09-23 MED ORDER — EMPAGLIFLOZIN 10 MG PO TABS: 10.0000 mg | ORAL_TABLET | Freq: Every day | ORAL | 3 refills | Status: DC

## 2019-09-23 NOTE — Assessment & Plan Note (Addendum)
Uncontrolled, A1c 10.1 today.  Again discussed initiating GLP-1 agonist to aid in better control as well as weight loss, however patient is very resistant to injectable medications.  Will instead trial Jardiance.  Message sent to pharmacy team in attempts to get patient set up with PAP program.  Advised patient on weight loss through diet and exercise changes.  Foot exam performed today, within normal limits.  Changed statin due to cost.  Urine microalbumin within normal limits.  Follow-up in 1 month after starting Jardiance.

## 2019-09-23 NOTE — Patient Instructions (Signed)
  Estuvo muy bien verte!  Nuestros planes para hoy: - Estamos agregando un nuevo medicamento para su diabetes llamado Jardiance. [place]. Estoy enviando un mensaje al equipo de farmacia para ver si podemos conseguirle este medicamento ms barato. Alguien de ese equipo te Pension scheme manager. - Voy a cambiar su medicamento para el colesterol por algo ms barato. - Consulte las recomendaciones a continuacin para una dieta para diabticos. - Asegrese de Engineer, site [employer]. Se recomienda dar un paseo de 15 minutos despus de la comida ms importante del Training and development officer. - Regrese en 1 mes para seguimiento.  Estamos revisando algunos laboratorios hoy, lo llamaremos o le enviaremos una carta si son anormales.  Tenga cuidado y busque atencin inmediata antes si tiene alguna inquietud.  Dr. [facility]   Recomendaciones dietticas para la diabetes  1. Consuma al menos 3 comidas y 1-2 refrigerios al Training and development officer. Nunca pase ms de 4-5 horas sin comer mientras est despierto. Desayune dentro de la primera hora despus de levantarse. 2. Limite los alimentos con almidn a DOS por comida y UNO por bocadillo. UNA porcin de un alimento con almidn es igual a lo siguiente: - UNA rebanada de pan (o su equivalente, como la mitad de un bollo de [place]). - 1/2 taza de un alimento con almidn "que se puede sacar con pala", como papas o arroz. - 15 gramos de carbohidratos totales como se [place] en la etiqueta de los alimentos. 3. Incluya en cada comida: un alimento con protenas, un alimento con carbohidratos y verduras y / o frutas. - Obtenga el doble de volumen de verduras que los alimentos con protenas o carbohidratos tanto para el almuerzo como para la cena. - Las verduras frescas o congeladas son las mejores. - Tenga verduras congeladas a mano para servirlas rpidamente.    Alimentos con almidn (carbohidratos): pan, arroz, pasta, papas, maz, cereales, smola, galletas  saladas, bagels, muffins, todos los productos horneados. (Las frutas, la [place] y el yogur tambin tienen carbohidratos, Armed forces training and education officer la mayora de estos alimentos no aumentarn su nivel de Location manager en la sangre como lo harn la mayora de los alimentos con almidn). Algunas frutas s causan niveles altos de azcar en la sangre; use pequeas porciones de pltanos (limtese a 1/2 a la vez), uvas, sandas, naranjas y la mayora de las frutas tropicales.  Alimentos con protenas: carne, pescado, aves, huevos, productos lcteos y frijoles como frijoles pintos y frijoles (los frijoles tambin proporcionan carbohidratos).

## 2019-09-23 NOTE — Telephone Encounter (Signed)
Opened in error

## 2019-09-24 LAB — HIV ANTIBODY (ROUTINE TESTING W REFLEX): HIV Screen 4th Generation wRfx: NONREACTIVE

## 2019-10-11 ENCOUNTER — Encounter (HOSPITAL_COMMUNITY): Payer: Self-pay

## 2019-10-11 ENCOUNTER — Ambulatory Visit (INDEPENDENT_AMBULATORY_CARE_PROVIDER_SITE_OTHER): Payer: Self-pay | Admitting: Family Medicine

## 2019-10-11 ENCOUNTER — Ambulatory Visit (HOSPITAL_COMMUNITY)
Admission: RE | Admit: 2019-10-11 | Discharge: 2019-10-11 | Disposition: A | Discharge status: Home / Self Care | Payer: Self-pay | Source: Ambulatory Visit | Attending: Family Medicine | Admitting: Family Medicine

## 2019-10-11 ENCOUNTER — Encounter: Payer: Self-pay | Admitting: Family

## 2019-10-11 ENCOUNTER — Encounter: Payer: Self-pay | Admitting: Family Medicine

## 2019-10-11 ENCOUNTER — Other Ambulatory Visit: Payer: Self-pay

## 2019-10-11 VITALS — BP 110/60 | HR 68

## 2019-10-11 DIAGNOSIS — M79605 Pain in left leg: Secondary | ICD-10-CM | POA: Insufficient documentation

## 2019-10-11 DIAGNOSIS — M25562 Pain in left knee: Secondary | ICD-10-CM

## 2019-10-11 NOTE — Progress Notes (Signed)
  Subjective:   Patient ID: [place]    DOB: [birth year], 53 y.o. female   MRN: [id number]  Meredith Maynard is a 53 y.o. female with a history of HTN, T2DM, GERD, morbid obesity here for   Left knee pain  Pain is: burning and leg "feels hard," intermittent Location: behind L knee, toes feel numb, starts in calf and travels up leg Pain started: Saturday afternoon  Pain prevents: states is hard to walk due to pain Medications tried: advil with relief, dragon pain relief cream Recent trauma: no Similar pain previously: happened twice before but not as bad as now Only L leg. No h/o blood clots, no hormonal meds, no recent travel  Symptoms Redness: no Swelling: yes Fever: no Weakness: no Weight loss: no Rash: no No shortness of breath or chest pain.  Review of Systems:  Per HPI.  Medications and smoking status reviewed.  Objective:   BP 110/60   Pulse 68   LMP 09/30/2019 (Approximate)   SpO2 97%  Vitals and nursing note reviewed.  General: morbidly obese female, in no acute distress with non-toxic appearance Extremities: warm and well perfused, normal tone MSK: Slight tenderness to palpation behind L knee. No swelling or redness appreciated. No joint laxity on exam. No palpable cords. Neuro: Alert and oriented, speech normal  Assessment & Plan:   Knee pain Intermittent pain of L calf and behind knee with burning sensation with unclear etiology. No abnormalities appreciated on exam although slight tenderness to palpation behind L knee. Some concern for DVT given location, unilateral, obesity. Also could have Baker's cyst given pain behind knee but difficult to appreciate given body habitus. Could also have diabetic neuropathy contributing but less likely given unilateral distribution. No h/o trauma or joint laxity on exam to suggest fracture or tendonous/ligamentous injury. Will obtain LE doppler US to r/o DVT and assess for Baker's cyst. Continue  OTC pain relief. Has f/u next month to discuss diabetes.   No orders of the defined types were placed in this encounter.  No orders of the defined types were placed in this encounter.   Rory Percy, DO PGY-3, [facility] 10/12/2019 12:08 PM

## 2019-10-11 NOTE — Patient Instructions (Signed)
  Estuvo muy bien verte!  Nuestros planes para hoy: - Nos harn una ecografa para buscar un cogulo de sangre o un quiste que le cause dolor. - Contine tomando advil segn sea necesario para Conservation officer, historic buildings. - Si presenta fiebre, dificultad para respirar o dolor en el pecho, debe acudir al servicio de urgencias.  Tenga cuidado y busque atencin inmediata antes si tiene alguna inquietud.  Dr. [facility]

## 2019-10-12 DIAGNOSIS — M25569 Pain in unspecified knee: Secondary | ICD-10-CM | POA: Insufficient documentation

## 2019-10-12 DIAGNOSIS — M25561 Pain in right knee: Secondary | ICD-10-CM | POA: Insufficient documentation

## 2019-10-12 NOTE — Assessment & Plan Note (Signed)
Intermittent pain of L calf and behind knee with burning sensation with unclear etiology. No abnormalities appreciated on exam although slight tenderness to palpation behind L knee. Some concern for DVT given location, unilateral, obesity. Also could have Baker's cyst given pain behind knee but difficult to appreciate given body habitus. Could also have diabetic neuropathy contributing but less likely given unilateral distribution. No h/o trauma or joint laxity on exam to suggest fracture or tendonous/ligamentous injury. Will obtain LE doppler US to r/o DVT and assess for Baker's cyst. Continue OTC pain relief. Has f/u next month to discuss diabetes.

## 2019-10-14 ENCOUNTER — Telehealth: Payer: Self-pay | Admitting: Family Medicine

## 2019-10-14 NOTE — Telephone Encounter (Signed)
Will forward to MD to advise. Uriah Trueba,CMA  

## 2019-10-14 NOTE — Telephone Encounter (Signed)
Please call patient with results from some testing she had done last week.  She needs an interpreter.  Phone number is [phone].

## 2019-10-14 NOTE — Telephone Encounter (Signed)
Returned patient call and informed of normal LE doppler results. Patient states pain feels like cramping and is concerned her statin is causing her pain. Told patient it was fine if she wanted to stop her statin until I see her at follow up on 1/7 and to make sure she drinks plenty of water and stretches her legs and calves daily. Patient verbalized understanding.

## 2019-11-07 ENCOUNTER — Other Ambulatory Visit: Payer: Self-pay

## 2019-11-07 ENCOUNTER — Encounter: Payer: Self-pay | Admitting: Family Medicine

## 2019-11-07 ENCOUNTER — Ambulatory Visit (INDEPENDENT_AMBULATORY_CARE_PROVIDER_SITE_OTHER): Payer: Self-pay | Admitting: Family Medicine

## 2019-11-07 VITALS — BP 110/58 | HR 80 | Ht 65.0 in | Wt 238.1 lb

## 2019-11-07 DIAGNOSIS — E1169 Type 2 diabetes mellitus with other specified complication: Secondary | ICD-10-CM

## 2019-11-07 DIAGNOSIS — M25562 Pain in left knee: Secondary | ICD-10-CM

## 2019-11-07 DIAGNOSIS — E669 Obesity, unspecified: Secondary | ICD-10-CM

## 2019-11-07 MED ORDER — GLIPIZIDE 5 MG PO TABS: 5.0000 mg | ORAL_TABLET | Freq: Two times a day (BID) | ORAL | 3 refills | Status: DC

## 2019-11-07 MED ORDER — PRAVASTATIN SODIUM 40 MG PO TABS: 40.0000 mg | ORAL_TABLET | Freq: Every day | ORAL | 3 refills | Status: DC

## 2019-11-07 MED ORDER — METFORMIN HCL 1000 MG PO TABS: 1000.0000 mg | ORAL_TABLET | Freq: Two times a day (BID) | ORAL | 3 refills | Status: DC

## 2019-11-07 MED ORDER — GLUCOSE BLOOD VI STRP: ORAL_STRIP | 12 refills | Status: AC

## 2019-11-07 NOTE — Assessment & Plan Note (Signed)
Still uncontrolled and taking glipizide as needed instead of twice daily, education provided.  Sent message to pharmacy team to help patient get financial assistance for Jardiance.  After much discussion sent to alternative statin to pharmacy even though not likely causing leg pain, see separate problem list note.  Sent test strips and prescription for Metformin and glipizide to pharmacy.  Follow-up in 1 month after starting Jardiance.

## 2019-11-07 NOTE — Assessment & Plan Note (Signed)
Persistent despite discontinuing statin.  LE Doppler negative for cyst or DVT.  Given burning pain and uncontrolled diabetes, may have neuropathy contributing though would be unusual to be unilateral.  Exam within normal limits today and not tender to palpation.  Referred to PT per patient request.  If pain continues to be persistent could consider increasing gabapentin.

## 2019-11-07 NOTE — Patient Instructions (Addendum)
  Estuvo muy bien verte!  Nuestros planes para hoy: - Lo estamos remitiendo a fisioterapia para el dolor de pierna. Contine tomando Advil segn sea necesario para el dolor. La almohadilla trmica tambin puede ser til para reducir el [place] y Baker. Envi resurtidos de estos a la farmacia. - Estamos cambiando su medicacin para el colesterol a pravastatina. Avseme si no lo tolera. - Estoy enviando un mensaje a nuestro equipo de farmacia para ayudarlo a Child psychotherapist un programa de asistencia financiera para Education officer, community. Una vez que est tomando este nuevo medicamento durante 1 mes, programe una cita de seguimiento.  Tenga cuidado y busque atencin inmediata antes si tiene alguna inquietud.  Dr. [facility]

## 2019-11-07 NOTE — Assessment & Plan Note (Addendum)
Contributing to uncontrolled diabetes.  Recommend weight loss through diet and exercise.

## 2019-11-07 NOTE — Progress Notes (Signed)
Subjective:   Patient ID: [place]    DOB: [birth year], 54 y.o. female   MRN: [id number]  Meredith Maynard is a 54 y.o. female with a history of HTN, T2DM, GERD, HLD, morbid obesity here for   Diabetes, Type 2 - Last A1c 10.1 on 09/23/2019 - Medications: Jardiance 10 mg (new since last visit), glipizide 5 mg twice daily, Metformin 1000 mg twice daily - Compliance: wasn't able to get jardiance due to cost. Still taking glipizide and metformin. Taking glipizide most days.  - Checking BG at home: no, needs test strips - Eye exam: ordered - Foot exam: UTD - Microalbumin: UTD - Statin: prescribed but not taking for fear of side effects, see below. - Denies symptoms of hypoglycemia, polyuria, polydipsia, numbness extremities, foot ulcers/trauma  L Leg pain f/u - Previously seen 12/12 for same, LE doppler at that time negative. -Pain is persistent, located in her left leg behind her knee with radiation into her thigh and calf.  Feels her pain has gotten slightly better but still persistent.  Described as burning pain. - Reports difficulties with flexing and stretching L leg secondary to pain. - Discontinued statin but reports persistent pain since stopping statin about a month ago. - Has tried Advil 200 mg, glucosamine, vitamin C. - Wants to be referred to PT as had relief previously.  Review of Systems:  Per HPI.  Medications and smoking status reviewed.  Objective:   BP (!) 110/58   Pulse 80   Ht 5\' 5"  (1.651 m)   Wt 238 lb 2 oz (108 kg)   LMP 10/24/2019   SpO2 98%   BMI 39.63 kg/m  Vitals and nursing note reviewed.  General: Morbidly obese female, in no acute distress with non-toxic appearance CV: regular rate and rhythm without murmurs, rubs, or gallops, no lower extremity edema Lungs: clear to auscultation bilaterally with normal work of breathing Skin: warm, dry, no rashes or lesions Extremities: warm and well perfused, normal tone.  No swelling,  redness, skin changes or tenderness to palpation of left leg.  Gait normal.  Assessment & Plan:   Diabetes mellitus type 2 in obese ([place]) Still uncontrolled and taking glipizide as needed instead of twice daily, education provided.  Sent message to pharmacy team to help patient get financial assistance for Jardiance.  After much discussion sent to alternative statin to pharmacy even though not likely causing leg pain, see separate problem list note.  Sent test strips and prescription for Metformin and glipizide to pharmacy.  Follow-up in 1 month after starting Jardiance.  Morbid obesity ([place]) Contributing to uncontrolled diabetes.  Recommend weight loss through diet and exercise.  Knee pain Persistent despite discontinuing statin.  LE Doppler negative for cyst or DVT.  Given burning pain and uncontrolled diabetes, may have neuropathy contributing though would be unusual to be unilateral.  Exam within normal limits today and not tender to palpation.  Referred to PT per patient request.  If pain continues to be persistent could consider increasing gabapentin.  Orders Placed This Encounter  Procedures  . Ambulatory referral to Physical Therapy    Referral Priority:   Routine    Referral Type:   Physical Medicine    Referral Reason:   Specialty Services Required    Requested Specialty:   Physical Therapy    Number of Visits Requested:   1   Meds ordered this encounter  Medications  . pravastatin (PRAVACHOL) 40 MG tablet    Sig: Take 1 tablet (40 mg  total) by mouth at bedtime.    Dispense:  90 tablet    Refill:  3  . glipiZIDE (GLUCOTROL) 5 MG tablet    Sig: Take 1 tablet (5 mg total) by mouth 2 (two) times daily before a meal.    Dispense:  60 tablet    Refill:  3  . metFORMIN (GLUCOPHAGE) 1000 MG tablet    Sig: Take 1 tablet (1,000 mg total) by mouth 2 (two) times daily with a meal.    Dispense:  60 tablet    Refill:  3  . glucose blood test strip    Sig: Use as instructed     Dispense:  100 each    Refill:  [place], DO PGY-3, [facility] 11/07/2019 12:28 PM

## 2019-11-14 ENCOUNTER — Ambulatory Visit: Payer: Self-pay | Admitting: Pharmacist

## 2019-11-14 DIAGNOSIS — E1169 Type 2 diabetes mellitus with other specified complication: Secondary | ICD-10-CM

## 2019-11-19 ENCOUNTER — Ambulatory Visit: Payer: Self-pay | Admitting: Pharmacist

## 2019-11-19 DIAGNOSIS — E1169 Type 2 diabetes mellitus with other specified complication: Secondary | ICD-10-CM

## 2019-11-19 NOTE — Progress Notes (Signed)
  Chronic Care Management   Outreach Note  11/14/2019 Name: Meredith Maynard MRN: [id number] DOB: Mar 11, 1966  Referred by: Rory Percy, DO Reason for referral : Chronic Care Management   An unsuccessful telephone outreach was attempted today. The patient was referred to the case management team by for assistance with care management and care coordination.   Follow Up Plan: A HIPPA compliant phone message was left for the patient providing contact information and requesting a return call.  The care management team will reach out to the patient again over the next 5-7 days.   SIGNATURE Regina Eck, PharmD, BCPS Clinical Pharmacist, [place]: [phone]

## 2019-11-19 NOTE — Progress Notes (Signed)
  Chronic Care Management   Outreach Note  11/19/2019 Name: Meredith Maynard MRN: [id number] DOB: [birth year]  Referred by: Rory Percy, DO Reason for referral : Chronic Care Management   A second unsuccessful telephone outreach was attempted today. The patient was referred to the case management team for assistance with care management and care coordination.   Follow Up Plan: A HIPPA compliant phone message was left for the patient providing contact information and requesting a return call.  Message left using [place] encouraging call back. The care management team will reach out to the patient again over the next 7-10 days.   SIGNATURE Regina Eck, PharmD, BCPS Clinical Pharmacist, [place]: [phone]

## 2019-11-26 ENCOUNTER — Ambulatory Visit (INDEPENDENT_AMBULATORY_CARE_PROVIDER_SITE_OTHER): Payer: Self-pay | Admitting: Family Medicine

## 2019-11-26 ENCOUNTER — Other Ambulatory Visit: Payer: Self-pay

## 2019-11-26 ENCOUNTER — Ambulatory Visit: Payer: Self-pay | Admitting: Pharmacist

## 2019-11-26 VITALS — BP 118/62 | HR 77 | Wt 239.2 lb

## 2019-11-26 DIAGNOSIS — E669 Obesity, unspecified: Secondary | ICD-10-CM

## 2019-11-26 DIAGNOSIS — E1169 Type 2 diabetes mellitus with other specified complication: Secondary | ICD-10-CM

## 2019-11-26 LAB — GLUCOSE, POCT (MANUAL RESULT ENTRY): POC Glucose: 233 mg/dl — AB (ref 70–99)

## 2019-11-26 NOTE — Assessment & Plan Note (Addendum)
Not due for A1c today.  CBG in clinic 233.  No concern for DKA or HHS.  She was encouraged to continue her current diabetic regimen and we would add on an additional medication today. -Continue glipizide 5 mg twice daily -Continue Metformin 1000 mg twice daily -Start Trulicity weekly injections.  Shown proper injection and use by pharmacy staff.  Advised to watch out for GI symptoms and injection site irritation. -Return in 1 month for diabetes follow-up and A1c check -Placed ambulatory referral to ophthalmology -Placed ambulatory referral to diabetic education

## 2019-11-26 NOTE — Progress Notes (Signed)
    Subjective:  Meredith Maynard is a 54 y.o. female who presents to the [facility] today with a chief complaint of elevated blood sugar.   HPI:  Hyperglycemia Current diabetes medication includes daily, glipizide 5 mg twice daily, metformin 1000 mg twice daily.  She is come to clinic today because she is concerned that her morning blood sugars have been elevated for the past 2 days.  She reports that she checked her blood sugar yesterday in the morning and found it to be 296 she checked it again several hours later and trends to still be elevated at 266.  She is concerned because she does not like that her diabetes is currently poorly controlled and she has at this elevation of blood sugar can lead to bad outcomes down the road.  She is currently denying symptoms of excessive taste, excessive urination, nausea, vomiting.  She is aware of the symptoms of low blood sugar and she denies that she has been having any issues with low blood sugar.  She would like to know what more she can do for her blood sugar right now.  She is aware that she is close to get yearly eye exam but she has not been established with an ophthalmologist.    Objective:  Physical Exam: BP 118/62   Pulse 77   Wt 239 lb 3.2 oz (108.5 kg)   LMP 10/15/2019 (Approximate)   SpO2 99%   BMI 39.80 kg/m    Gen: NAD, resting comfortably seated in the exam room HEENT: Mildly dry mucous membranes. CV: RRR with no murmurs appreciated Pulm: NWOB, CTAB with no crackles, wheezes, or rhonchi Abdomen: No abdominal tenderness at this time   Results for orders placed or performed in visit on 11/26/19 (from the past 72 hour(s))  Glucose (CBG)     Status: Abnormal   Collection Time: 11/26/19 11:12 AM  Result Value Ref Range   POC Glucose 233 (A) 70 - 99 mg/dl     Assessment/Plan:  Diabetes mellitus type 2 in obese [facility]) Not due for A1c today.  CBG in clinic 233.  No concern for DKA or HHS.  She was encouraged to continue  her current diabetic regimen and we would add on an additional medication today. -Continue glipizide 5 mg twice daily -Continue Metformin 1000 mg twice daily -Start Trulicity weekly injections.  Shown proper injection and use by pharmacy staff.  Advised to watch out for GI symptoms and injection site irritation. -Return in 1 month for diabetes follow-up and A1c check -Placed ambulatory referral to ophthalmology -Placed ambulatory referral to diabetic education

## 2019-11-26 NOTE — Progress Notes (Signed)
Chronic Care Management   Visit Note  11/26/2019 Name: Meredith Maynard MRN: [id number] DOB: [birth year]  Referred by: Rory Percy, DO Reason for referral : Chronic Care Management (Diabetes)   Meredith Maynard is a 54 y.o. year old female who is a primary care patient of Rory Percy, DO. The CCM team was consulted for assistance with chronic disease management and care coordination needs related to DMII  Review of patient status, including review of consultants reports, relevant laboratory and other test results, and collaboration with appropriate care team members and the patient's provider was performed as part of comprehensive patient evaluation and provision of chronic care management services.    Patient visit conducted today in clinic with assistance of ipad for today's visit regarding her diabetes and chronic care management  Medications: Outpatient Encounter Medications as of 11/26/2019  Medication Sig Note  . Dulaglutide (TRULICITY) 4.33 IR/5.1OA SOPN Inject 0.75 mg into the skin once a week. 11/26/2019: Tuesdays-started on 11/26/19  . Blood Glucose Monitoring Suppl (AGAMATRIX PRESTO) w/Device KIT 1 Device by Does not apply route daily.   . cetirizine (ZYRTEC) 10 MG tablet Take 1 tablet (10 mg total) by mouth daily.   . famotidine (PEPCID) 20 MG tablet Take 1 tablet (20 mg total) by mouth 2 (two) times daily. 06/13/2019: Only taking prn  . fluticasone (FLONASE) 50 MCG/ACT nasal spray Place 2 sprays into both nostrils daily.   Marland Kitchen gabapentin (NEURONTIN) 100 MG capsule Take 2 capsules (200 mg total) by mouth 2 (two) times daily.   Marland Kitchen glipiZIDE (GLUCOTROL) 5 MG tablet Take 1 tablet (5 mg total) by mouth 2 (two) times daily before a meal.   . GLUCOSAMINE HCL PO Take 1 tablet by mouth daily.   Marland Kitchen glucose blood test strip Use as instructed   . ibuprofen (ADVIL) 200 MG tablet Take 200 mg by mouth every 6 (six) hours as needed (taking most days).   Marland Kitchen lisinopril  (PRINIVIL,ZESTRIL) 20 MG tablet Take 1 tablet (20 mg total) by mouth daily.   . meclizine (ANTIVERT) 12.5 MG tablet Take 1 tablet (12.5 mg total) by mouth 3 (three) times daily as needed for dizziness.   . metFORMIN (GLUCOPHAGE) 1000 MG tablet Take 1 tablet (1,000 mg total) by mouth 2 (two) times daily with a meal.   . Multiple Vitamins-Minerals (MULTIVITAMIN ADULT PO) Take 1 tablet by mouth daily.   . pravastatin (PRAVACHOL) 40 MG tablet Take 1 tablet (40 mg total) by mouth at bedtime.   . [DISCONTINUED] empagliflozin (JARDIANCE) 10 MG TABS tablet Take 10 mg by mouth daily.    No facility-administered encounter medications on file as of 11/26/2019.     Objective:   Goals Addressed            This Visit's Progress     Patient Stated   . I would like to control my diabetes (pt-stated)       Current Barriers:  . Diabetes: C1YS; complicated by chronic medical conditions including HTN, HLD most recent A1c 10.1% on 09/23/19 . Current antihyperglycemic regimen: metformin, glipizide o Starting trulicity 0.63KZ weekly (will increase to 1.59m as tolerated) o Assisted pt with injection  o Application submitted for [employer](trulicity) o Samples given . Denies hypoglycemic symptoms, . Reports hyperglycemic symptoms, including polyuria, polydipsia, nocturia, neuropathy . Current exercise: works, walks . Current blood glucose readings: 200-300 . Cardiovascular risk reduction: o Current hypertensive regimen: lisinopril o Current hyperlipidemia regimen:  pravastatin (LDL 100 on 06/03/19)  Pharmacist Clinical Goal(s):  .  Over the next 90 days, patient will work with PharmD and primary care provider to address needs related to diabetes management   Interventions: . Comprehensive medication review performed, medication list updated in electronic medical record . Reviewed & discussed the following diabetes-related information with patient: o Follow ADA recommended "diabetes-friendly" diet   (reviewed healthy snack/food options) o Discussed & demonstrated GLP-1 injection technique; Patient uses Relion glucometer from [place] o Reviewed medication purpose/side effects-->patient denies adverse events  Patient Self Care Activities:  . Patient will check blood glucose daily, document, and provide at future appointments . Patient will focus on medication adherence  . Patient will take medications as prescribed . Patient will contact provider with any episodes of hypoglycemia . Patient will report any questions or concerns to provider   Initial goal documentation         Plan:   The care management team will reach out to the patient again over the next 7 days.   Provider Signature Regina Eck, PharmD, BCPS Clinical Pharmacist, [place]: [phone]

## 2019-11-26 NOTE — Patient Instructions (Signed)
Gracias por venir para hablar mas sobre su diabetes.  Si, su nivel de azucar esta alta pero no es peligroso hoy.  Vamos a Art gallery manager un otro medicamento para ayudar Aeronautical engineer.  Hablo com la farmacista sobre su medicamento nuevo y la [place] correcta usarlo.    Por favor regrese a Air traffic controller en un mes para repasar sus medicamentos y Airline pilot su A1c de nuevo.  He mandado un orden para un doctor de opthalmologia. Llama la clinica si no ha recibido un llamada para hacer una cita con el ophthalmologo despues de 2 semanas.

## 2019-12-09 ENCOUNTER — Telehealth: Payer: Self-pay

## 2019-12-09 NOTE — Telephone Encounter (Signed)
Patient calling nurse line regarding refill request for Trulicity. Consulted with Lottie Dawson and discussed that we would be able to provide patient with 0.75mg  sample tomorrow morning. Patient verbalized understanding and will come to office around 9am tomorrow morning.   Spanish interpreter used for conversation.   Talbot Grumbling, RN

## 2019-12-10 ENCOUNTER — Ambulatory Visit: Payer: Self-pay | Admitting: Pharmacist

## 2019-12-10 DIAGNOSIS — E1122 Type 2 diabetes mellitus with diabetic chronic kidney disease: Secondary | ICD-10-CM

## 2019-12-10 NOTE — Progress Notes (Signed)
Chronic Care Management  Visit Note  12/10/2019 Name: Meredith Maynard MRN: [id number] DOB: [birth year]  Referred by: Rory Percy, DO Reason for referral : Chronic Care Management (Diabetes)   Meredith Maynard is a 54 y.o. year old female who is a primary care patient of Rory Percy, DO. The CCM team was consulted for assistance with chronic disease management and care coordination needs related to DMII  Review of patient status, including review of consultants reports, relevant laboratory and other test results, and collaboration with appropriate care team members and the patient's provider was performed as part of comprehensive patient evaluation and provision of chronic care management services.       Medications: Outpatient Encounter Medications as of 12/10/2019  Medication Sig Note  . Blood Glucose Monitoring Suppl (AGAMATRIX PRESTO) w/Device KIT 1 Device by Does not apply route daily.   . cetirizine (ZYRTEC) 10 MG tablet Take 1 tablet (10 mg total) by mouth daily.   . Dulaglutide (TRULICITY) 2.70 WC/3.7SE SOPN Inject 0.75 mg into the skin once a week. 11/26/2019: Tuesdays-started on 11/26/19  . famotidine (PEPCID) 20 MG tablet Take 1 tablet (20 mg total) by mouth 2 (two) times daily. 06/13/2019: Only taking prn  . fluticasone (FLONASE) 50 MCG/ACT nasal spray Place 2 sprays into both nostrils daily.   Marland Kitchen gabapentin (NEURONTIN) 100 MG capsule Take 2 capsules (200 mg total) by mouth 2 (two) times daily.   Marland Kitchen glipiZIDE (GLUCOTROL) 5 MG tablet Take 1 tablet (5 mg total) by mouth 2 (two) times daily before a meal.   . GLUCOSAMINE HCL PO Take 1 tablet by mouth daily.   Marland Kitchen glucose blood test strip Use as instructed   . ibuprofen (ADVIL) 200 MG tablet Take 200 mg by mouth every 6 (six) hours as needed (taking most days).   Marland Kitchen lisinopril (PRINIVIL,ZESTRIL) 20 MG tablet Take 1 tablet (20 mg total) by mouth daily.   . meclizine (ANTIVERT) 12.5 MG tablet Take 1 tablet (12.5 mg  total) by mouth 3 (three) times daily as needed for dizziness.   . metFORMIN (GLUCOPHAGE) 1000 MG tablet Take 1 tablet (1,000 mg total) by mouth 2 (two) times daily with a meal.   . Multiple Vitamins-Minerals (MULTIVITAMIN ADULT PO) Take 1 tablet by mouth daily.   . pravastatin (PRAVACHOL) 40 MG tablet Take 1 tablet (40 mg total) by mouth at bedtime.    No facility-administered encounter medications on file as of 12/10/2019.     Objective:   Goals Addressed            This Visit's Progress     Patient Stated   . I would like to control my diabetes (pt-stated)       Current Barriers:  . Diabetes: G3TD; complicated by chronic medical conditions including HTN, HLD most recent A1c 10.1% on 09/23/19 . Current antihyperglycemic regimen: metformin, glipizide, trulicity 1.76 o Starting trulicity 1.60VP weekly (will increase to 1.66m as tolerated) o Assisted pt with injection  o Application submitted for [employer](trulicity) o Trulicity samples given . Denies hypoglycemic symptoms . Reports hyperglycemic symptoms, including polyuria, polydipsia, nocturia, neuropathy . Current exercise: works, walks . Current blood glucose readings: 130-160 . Cardiovascular risk reduction: o Current hypertensive regimen: lisinopril o Current hyperlipidemia regimen:  pravastatin (LDL 100 on 06/03/19)  Pharmacist Clinical Goal(s):  .Marland KitchenOver the next 90 days, patient will work with PharmD and primary care provider to address needs related to diabetes management   Interventions: . Comprehensive medication review performed, medication list updated  in electronic medical record . Reviewed & discussed the following diabetes-related information with patient: o Follow ADA recommended "diabetes-friendly" diet  (reviewed healthy snack/food options) o Discussed & demonstrated GLP-1 injection technique; Patient uses Relion glucometer from [place] o Reviewed medication purpose/side effects-->patient denies adverse  events  Patient Self Care Activities:  . Patient will check blood glucose daily, document, and provide at future appointments . Patient will focus on medication adherence  . Patient will take medications as prescribed . Patient will contact provider with any episodes of hypoglycemia . Patient will report any questions or concerns to provider   Please see past updates related to this goal by clicking on the "Past Updates" button in the selected goal          Plan:   The care management team will reach out to the patient again over the next 14 days.   Provider Signature Regina Eck, PharmD, BCPS Clinical Pharmacist, [place]: [phone]

## 2019-12-17 ENCOUNTER — Ambulatory Visit: Payer: Self-pay | Admitting: Pharmacist

## 2019-12-17 ENCOUNTER — Ambulatory Visit: Payer: PRIVATE HEALTH INSURANCE | Admitting: Dietician

## 2019-12-17 DIAGNOSIS — E1169 Type 2 diabetes mellitus with other specified complication: Secondary | ICD-10-CM

## 2019-12-20 NOTE — Progress Notes (Signed)
Chronic Care Management   Visit Note  12/17/2019 Name: Meredith Maynard MRN: [id number] DOB: [birth year]  Referred by: Rory Percy, DO Reason for referral : Chronic Care Management (Diabetes)   Meredith Maynard is a 54 y.o. year old female who is a primary care patient of Rory Percy, DO. The CCM team was consulted for assistance with chronic disease management and care coordination needs related to DMII  Review of patient status, including review of consultants reports, relevant laboratory and other test results, and collaboration with appropriate care team members and the patient's provider was performed as part of comprehensive patient evaluation and provision of chronic care management services.    SDOH (Social Determinants of Health) assessments performed: Yes    Medications: Outpatient Encounter Medications as of 12/17/2019  Medication Sig Note  . Blood Glucose Monitoring Suppl (AGAMATRIX PRESTO) w/Device KIT 1 Device by Does not apply route daily.   . cetirizine (ZYRTEC) 10 MG tablet Take 1 tablet (10 mg total) by mouth daily.   . Dulaglutide (TRULICITY) 3.81 WE/9.9BZ SOPN Inject 0.75 mg into the skin once a week. 11/26/2019: Tuesdays-started on 11/26/19  . famotidine (PEPCID) 20 MG tablet Take 1 tablet (20 mg total) by mouth 2 (two) times daily. 06/13/2019: Only taking prn  . fluticasone (FLONASE) 50 MCG/ACT nasal spray Place 2 sprays into both nostrils daily.   Marland Kitchen gabapentin (NEURONTIN) 100 MG capsule Take 2 capsules (200 mg total) by mouth 2 (two) times daily.   Marland Kitchen glipiZIDE (GLUCOTROL) 5 MG tablet Take 1 tablet (5 mg total) by mouth 2 (two) times daily before a meal.   . GLUCOSAMINE HCL PO Take 1 tablet by mouth daily.   Marland Kitchen glucose blood test strip Use as instructed   . ibuprofen (ADVIL) 200 MG tablet Take 200 mg by mouth every 6 (six) hours as needed (taking most days).   Marland Kitchen lisinopril (PRINIVIL,ZESTRIL) 20 MG tablet Take 1 tablet (20 mg total) by mouth  daily.   . meclizine (ANTIVERT) 12.5 MG tablet Take 1 tablet (12.5 mg total) by mouth 3 (three) times daily as needed for dizziness.   . metFORMIN (GLUCOPHAGE) 1000 MG tablet Take 1 tablet (1,000 mg total) by mouth 2 (two) times daily with a meal.   . Multiple Vitamins-Minerals (MULTIVITAMIN ADULT PO) Take 1 tablet by mouth daily.   . pravastatin (PRAVACHOL) 40 MG tablet Take 1 tablet (40 mg total) by mouth at bedtime.    No facility-administered encounter medications on file as of 12/17/2019.     Objective:   Goals Addressed            This Visit's Progress     Patient Stated   . I would like to control my diabetes (pt-stated)       Current Barriers:  . Diabetes: J6RC; complicated by chronic medical conditions including HTN, HLD most recent A1c 10.1% on 09/23/19 . Current antihyperglycemic regimen: metformin, glipizide, trulicity 7.89 o Starting trulicity 3.81OF weekly (will increase to 1.70m as tolerated) o Assisted pt with injection  o Application submitted for [employer](trulicity)--patient approved until 27/5102for Trulicity, will increase dose next refill as tolerated.  Medication will ship to PCP office o Trulicity samples given . Denies hypoglycemic symptoms . Reports hyperglycemic symptoms, including polyuria, polydipsia, nocturia, neuropathy . Current exercise: works, walks . Current blood glucose readings: 130-160 . Cardiovascular risk reduction: o Current hypertensive regimen: lisinopril o Current hyperlipidemia regimen:  pravastatin (LDL 100 on 06/03/19)  Pharmacist Clinical Goal(s):  .Marland KitchenOver the next 90  days, patient will work with PharmD and primary care provider to address needs related to diabetes management   Interventions: . Comprehensive medication review performed, medication list updated in electronic medical record . Reviewed & discussed the following diabetes-related information with patient: o Follow ADA recommended "diabetes-friendly" diet  (reviewed  healthy snack/food options) o Discussed & demonstrated GLP-1 injection technique; Patient uses Relion glucometer from [place] o Reviewed medication purpose/side effects-->patient denies adverse events  Patient Self Care Activities:  . Patient will check blood glucose daily, document, and provide at future appointments . Patient will focus on medication adherence  . Patient will take medications as prescribed . Patient will contact provider with any episodes of hypoglycemia . Patient will report any questions or concerns to provider   Please see past updates related to this goal by clicking on the "Past Updates" button in the selected goal          Plan:   The care management team will reach out to the patient again over the next 30 days.   Provider Signature Regina Eck, PharmD, BCPS Clinical Pharmacist, [place]: [phone]

## 2019-12-23 ENCOUNTER — Ambulatory Visit: Payer: Self-pay | Admitting: Pharmacist

## 2019-12-23 ENCOUNTER — Telehealth: Payer: Self-pay

## 2019-12-23 DIAGNOSIS — E669 Obesity, unspecified: Secondary | ICD-10-CM

## 2019-12-23 DIAGNOSIS — E1169 Type 2 diabetes mellitus with other specified complication: Secondary | ICD-10-CM

## 2019-12-23 NOTE — Telephone Encounter (Signed)
Patient's daughter calls nurse line with questions regarding patient receiving trulicity. Spoke with Almyra Free (pharmacist), shipment is to be arriving at office for patient. Will notify patient when shipment has arrived and is ready for pick up.   Almyra Free reaching out to company to check status of shipment.   To Pharmacist  Talbot Grumbling, RN

## 2019-12-26 ENCOUNTER — Telehealth: Payer: Self-pay | Admitting: *Deleted

## 2019-12-26 NOTE — Progress Notes (Signed)
    SUBJECTIVE:   CHIEF COMPLAINT / HPI: Here for diabetes follow-up  Diabetes Last A1c 10.1.  A1c today 8.8 Current medications include -Glipizide 5 BID -Metformin 123XX123 BID -trulicity Q week She notes that she was started on Trulicity with the help of our clinic and was told that her subsequent dose of Trulicity would be sent to our clinic.  It was not sent to her home and she did not receive her Trulicity dose for this past week.  Constipation She reports that she occasionally has hard stools require additional effort.  She typically has a bowel movement every 1 to 2 days.  She is wondering if any of her current medications may be contributing to constipation.  PERTINENT  PMH / PSH: Diabetes, type II  OBJECTIVE:   BP 118/72   Pulse 82   Wt 238 lb (108 kg)   LMP 12/22/2019 (Exact Date)   SpO2 96%   BMI 39.61 kg/m    General: Well-appearing 54 year old woman.  No acute distress.  Resting comfortably in exam chair Respiratory: Breathing comfortably on room air.  Normal respiratory effort.  ASSESSMENT/PLAN:   Diabetes mellitus type 2 in obese (HCC) Improved A1c. -Continue glipizide -Continue Metformin -Continue Trulicity, medication provided in clinic today -Follow-up in 3 months  Constipation -MiraLAX as needed   Health maintenance She is aware that she needs to follow-up with ophthalmology and is also due for colonoscopy.  She is hesitant to move forward with this testing for financial reasons.  She is going through the process to be approved for the orange card and believes it will help her financial situation and ability to have testing done.  She was encouraged to reach out to Leory Plowman with any further questions.  Matilde Haymaker, MD [place]

## 2019-12-26 NOTE — Telephone Encounter (Signed)
LM for patient that medication has arrived and she can come by office to pick them up.  Crandall Harvel,CMA

## 2019-12-26 NOTE — Telephone Encounter (Signed)
LVM using [facility] Shy [id number] for pt to call office to go over screening questions prior to visit.Claudetta Sallie Zimmerman Rumple, CMA

## 2019-12-26 NOTE — Telephone Encounter (Signed)
-----   Message from [place] Pink, [place] sent at 12/26/2019 10:56 AM EST ----- Regarding: meds This patient's meds arrived today

## 2019-12-27 ENCOUNTER — Ambulatory Visit (INDEPENDENT_AMBULATORY_CARE_PROVIDER_SITE_OTHER): Payer: Self-pay | Admitting: Family Medicine

## 2019-12-27 ENCOUNTER — Encounter: Payer: Self-pay | Admitting: Family Medicine

## 2019-12-27 ENCOUNTER — Other Ambulatory Visit: Payer: Self-pay

## 2019-12-27 VITALS — BP 118/72 | HR 82 | Wt 238.0 lb

## 2019-12-27 DIAGNOSIS — K5900 Constipation, unspecified: Secondary | ICD-10-CM

## 2019-12-27 DIAGNOSIS — E669 Obesity, unspecified: Secondary | ICD-10-CM

## 2019-12-27 DIAGNOSIS — E1169 Type 2 diabetes mellitus with other specified complication: Secondary | ICD-10-CM

## 2019-12-27 LAB — POCT GLYCOSYLATED HEMOGLOBIN (HGB A1C): HbA1c, POC (controlled diabetic range): 8.8 % — AB (ref 0.0–7.0)

## 2019-12-27 NOTE — Assessment & Plan Note (Signed)
-  MiraLAX as needed

## 2019-12-27 NOTE — Patient Instructions (Signed)
Mucho gusto en verle de nuevo.  Siga tomando los [employer] para su diabetes: Glipizide Metformin Trulicity  Para mas informacion sobre su tarjeta, hable con [place].  Para informacion sobre la vaccuna contra COVID, me han dicho que es posible llenar una forma para recibir textos sobre su grupo el tiemp correcto recibir una vaccuna.   [email or web address]

## 2019-12-27 NOTE — Assessment & Plan Note (Signed)
Improved A1c. -Continue glipizide -Continue Metformin -Continue Trulicity, medication provided in clinic today -Follow-up in 3 months

## 2019-12-27 NOTE — Progress Notes (Signed)
Chronic Care Management    Visit Note  12/23/2019 Name: Abbrielle Batts MRN: [id number] DOB: 1966-06-24  Referred by: Rory Percy, DO Reason for referral : Chronic Care Management (Diabetes)   Meredith Maynard is a 54 y.o. year old female who is a primary care patient of Rory Percy, DO. The CCM team was consulted for assistance with chronic disease management and care coordination needs related to DMII  Review of patient status, including review of consultants reports, relevant laboratory and other test results, and collaboration with appropriate care team members and the patient's provider was performed as part of comprehensive patient evaluation and provision of chronic care management services.    SDOH (Social Determinants of Health) assessments performed: No See Care Plan activities for detailed interventions related to SDOH)     Medications: Outpatient Encounter Medications as of 12/23/2019  Medication Sig Note  . Blood Glucose Monitoring Suppl (AGAMATRIX PRESTO) w/Device KIT 1 Device by Does not apply route daily.   . cetirizine (ZYRTEC) 10 MG tablet Take 1 tablet (10 mg total) by mouth daily.   . Dulaglutide (TRULICITY) 2.95 JO/8.4ZY SOPN Inject 0.75 mg into the skin once a week. 11/26/2019: Tuesdays-started on 11/26/19  . famotidine (PEPCID) 20 MG tablet Take 1 tablet (20 mg total) by mouth 2 (two) times daily. 06/13/2019: Only taking prn  . fluticasone (FLONASE) 50 MCG/ACT nasal spray Place 2 sprays into both nostrils daily.   Marland Kitchen gabapentin (NEURONTIN) 100 MG capsule Take 2 capsules (200 mg total) by mouth 2 (two) times daily.   Marland Kitchen glipiZIDE (GLUCOTROL) 5 MG tablet Take 1 tablet (5 mg total) by mouth 2 (two) times daily before a meal.   . GLUCOSAMINE HCL PO Take 1 tablet by mouth daily.   Marland Kitchen glucose blood test strip Use as instructed   . ibuprofen (ADVIL) 200 MG tablet Take 200 mg by mouth every 6 (six) hours as needed (taking most days).   Marland Kitchen lisinopril  (PRINIVIL,ZESTRIL) 20 MG tablet Take 1 tablet (20 mg total) by mouth daily.   . meclizine (ANTIVERT) 12.5 MG tablet Take 1 tablet (12.5 mg total) by mouth 3 (three) times daily as needed for dizziness.   . metFORMIN (GLUCOPHAGE) 1000 MG tablet Take 1 tablet (1,000 mg total) by mouth 2 (two) times daily with a meal.   . Multiple Vitamins-Minerals (MULTIVITAMIN ADULT PO) Take 1 tablet by mouth daily.   . pravastatin (PRAVACHOL) 40 MG tablet Take 1 tablet (40 mg total) by mouth at bedtime.    No facility-administered encounter medications on file as of 12/23/2019.     Objective:   Goals Addressed            This Visit's Progress     Patient Stated   . I would like to control my diabetes (pt-stated)       Current Barriers:  . Diabetes: S0YT; complicated by chronic medical conditions including HTN, HLD most recent A1c 10.1% on 09/23/19 . Current antihyperglycemic regimen: metformin, glipizide, trulicity 0.16 o Starting trulicity 0.10XN weekly (will increase to 1.65m as tolerated) o Assisted pt with injection  o Application submitted for [employer](trulicity)--patient approved until 22/3557for Trulicity, will increase dose next refill as tolerated.  Medication shipped  to PCP office (42-monthupply) . Denies hypoglycemic symptoms . Reports hyperglycemic symptoms, including polyuria, polydipsia, nocturia, neuropathy . Current exercise: works, walks . Current blood glucose readings: 130-160 . Cardiovascular risk reduction: o Current hypertensive regimen: lisinopril o Current hyperlipidemia regimen:  pravastatin (LDL 100 on 06/03/19)  Pharmacist Clinical Goal(s):  Marland Kitchen Over the next 90 days, patient will work with PharmD and primary care provider to address needs related to diabetes management   Interventions: . Comprehensive medication review performed, medication list updated in electronic medical record . Reviewed & discussed the following diabetes-related information with  patient: o Follow ADA recommended "diabetes-friendly" diet  (reviewed healthy snack/food options) o Discussed & demonstrated GLP-1 injection technique; Patient uses Relion glucometer from [place] o Reviewed medication purpose/side effects-->patient denies adverse events  Patient Self Care Activities:  . Patient will check blood glucose daily, document, and provide at future appointments . Patient will focus on medication adherence  . Patient will take medications as prescribed . Patient will contact provider with any episodes of hypoglycemia . Patient will report any questions or concerns to provider   Please see past updates related to this goal by clicking on the "Past Updates" button in the selected goal          Plan:   The care management team will reach out to the patient again over the next 30 days.   Provider Signature Regina Eck, PharmD, BCPS Clinical Pharmacist, [place]: [phone]

## 2020-01-03 ENCOUNTER — Other Ambulatory Visit: Payer: Self-pay | Admitting: Family Medicine

## 2020-01-03 DIAGNOSIS — Z1231 Encounter for screening mammogram for malignant neoplasm of breast: Secondary | ICD-10-CM

## 2020-02-10 ENCOUNTER — Other Ambulatory Visit: Payer: Self-pay

## 2020-02-10 ENCOUNTER — Ambulatory Visit
Admission: RE | Admit: 2020-02-10 | Discharge: 2020-02-10 | Disposition: A | Discharge status: Home / Self Care | Payer: PRIVATE HEALTH INSURANCE | Source: Ambulatory Visit | Attending: Family Medicine | Admitting: Family Medicine

## 2020-02-10 DIAGNOSIS — Z1231 Encounter for screening mammogram for malignant neoplasm of breast: Secondary | ICD-10-CM

## 2020-03-13 ENCOUNTER — Other Ambulatory Visit: Payer: Self-pay | Admitting: Family Medicine

## 2020-03-13 DIAGNOSIS — E669 Obesity, unspecified: Secondary | ICD-10-CM

## 2020-03-13 DIAGNOSIS — E1159 Type 2 diabetes mellitus with other circulatory complications: Secondary | ICD-10-CM

## 2020-03-13 MED ORDER — LISINOPRIL 20 MG PO TABS: 20.0000 mg | ORAL_TABLET | Freq: Every day | ORAL | 0 refills | Status: DC

## 2020-03-13 NOTE — Telephone Encounter (Signed)
Pt would like a refill on her BP medication to be called in or have a paper prescription.This will be the second day without her medication. jw

## 2020-03-17 ENCOUNTER — Telehealth: Payer: Self-pay | Admitting: Pharmacist

## 2020-03-17 DIAGNOSIS — E1169 Type 2 diabetes mellitus with other specified complication: Secondary | ICD-10-CM

## 2020-03-17 NOTE — Telephone Encounter (Signed)
Noted and agree. 

## 2020-03-17 NOTE — Telephone Encounter (Signed)
Patient contacted RE blood glucose control.    Interview conducted by Pharmacy Student - Sherre Poot, PharmD Candidate in [place].   Patient reported home readings of 130-180 fasting.  Patient willing to return to office for Pharmacy Clinic visit on 5/25 at 10:00 AM  A1C planned at that time.   Consider dose increase of Trulicity from A999333 to 1.5 at that time.  (Patient has 5 pens remaining)  - No additional PAP form today.

## 2020-03-17 NOTE — Assessment & Plan Note (Signed)
Patient contacted RE blood glucose control.    Interview conducted by Pharmacy Student - Sherre Poot, PharmD Candidate in [place].   Patient reported home readings of 130-180 fasting.  Patient willing to return to office for Pharmacy Clinic visit on 5/25 at 10:00 AM  A1C planned at that time.   Consider dose increase of Trulicity from A999333 to 1.5 at that time.  (Patient has 5 pens remaining)  - No additional PAP form today.

## 2020-03-24 ENCOUNTER — Ambulatory Visit (INDEPENDENT_AMBULATORY_CARE_PROVIDER_SITE_OTHER): Payer: Self-pay | Admitting: Pharmacist

## 2020-03-24 ENCOUNTER — Other Ambulatory Visit: Payer: Self-pay

## 2020-03-24 ENCOUNTER — Encounter: Payer: Self-pay | Admitting: Pharmacist

## 2020-03-24 DIAGNOSIS — E1159 Type 2 diabetes mellitus with other circulatory complications: Secondary | ICD-10-CM

## 2020-03-24 DIAGNOSIS — E1369 Other specified diabetes mellitus with other specified complication: Secondary | ICD-10-CM

## 2020-03-24 DIAGNOSIS — E669 Obesity, unspecified: Secondary | ICD-10-CM

## 2020-03-24 DIAGNOSIS — I10 Essential (primary) hypertension: Secondary | ICD-10-CM

## 2020-03-24 DIAGNOSIS — E1169 Type 2 diabetes mellitus with other specified complication: Secondary | ICD-10-CM

## 2020-03-24 DIAGNOSIS — E7800 Pure hypercholesterolemia, unspecified: Secondary | ICD-10-CM

## 2020-03-24 LAB — POCT GLYCOSYLATED HEMOGLOBIN (HGB A1C): HbA1c, POC (controlled diabetic range): 9.1 % — AB (ref 0.0–7.0)

## 2020-03-24 MED ORDER — TRULICITY 1.5 MG/0.5ML ~~LOC~~ SOAJ: 1.5000 mg | SUBCUTANEOUS | Status: DC

## 2020-03-24 MED ORDER — ATORVASTATIN CALCIUM 40 MG PO TABS: 40.0000 mg | ORAL_TABLET | Freq: Every day | ORAL | 3 refills | Status: DC

## 2020-03-24 NOTE — Assessment & Plan Note (Signed)
Hypertension longstanding currently controlled.  Blood pressure goal = <130/80 mmHg. Medication adherence good.   -Continued lisinopril 20 mg daily.

## 2020-03-24 NOTE — Assessment & Plan Note (Addendum)
ASCVD risk - primary prevention in patient with diabetes. Last LDL is not controlled. ASCVD risk score is not >20%  - moderate intensity statin indicated. Aspirin is not indicated.  -Discontinued pravastatin 40 mg daily. -Initiated atorvastatin 40 mg daily. -Follow-up in 3 months with lipid panel to assess efficacy.

## 2020-03-24 NOTE — Progress Notes (Signed)
S:     Chief Complaint  Patient presents with  . Medication Management    Diabetes Management    Patient arrives in good spirits, well appearing, and ambulating without assistance. She reports being concerned about her blood sugars being high in the morning before breakfast. She mentions wanting to lose weight as well. Presents for diabetes evaluation, education, and management.  Patient was referred and last seen by  Dr. Pilar Maynard on 12/27/2019.   Insurance coverage/medication affordability: Uninsured  Medication adherence reported: good .   Current diabetes medications include: glipizide 5 mg BID, metformin 123XX123 mg BID, Trulicity (dulaglutide) 0.75 mg SQ once weekly Current hypertension medications include: lisinopril 20 mg daily Current hyperlipidemia medications include: pravastatin 40 mg daily  Patient denies hypoglycemic events.  Patient reported dietary habits: Eats ~3 meals/day She reports eating vegetables and fruits. States that she has decreased amount of carbohydrates she eats and has gone down to two tortillas a day, sometimes three. She reports not eating a lot of sugar in her meals.  Patient-reported exercise habits: Patient reports that she walks every day. 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 She also reports walking her children to school. Patient feels that she is exercising a good amount.     O:  Physical Exam Constitutional:      Appearance: Normal appearance. She is obese.  Neurological:     Mental Status: She is alert.  Psychiatric:        Mood and Affect: Mood normal.        Thought Content: Thought content normal.        Judgment: Judgment normal.    Review of Systems  Musculoskeletal: Positive for myalgias.       Pt reports waking up and feeling diabetic "cramps" and muscle aches  All other systems reviewed and are negative.    Lab Results  Component Value Date   HGBA1C 9.1 (A) 03/24/2020   Vitals:   03/24/20 1023  BP: 130/86  Pulse: 79  SpO2: 96%    Lipid Panel     Component Value Date/Time   CHOL 186 06/03/2019 1328   TRIG 198 (H) 06/03/2019 1328   HDL 46 06/03/2019 1328   CHOLHDL 4.0 06/03/2019 1328   CHOLHDL 3.2 10/14/2016 0835   VLDL 33 (H) 10/14/2016 0835   LDLCALC 100 (H) 06/03/2019 1328   LDLDIRECT 108 (H) 03/12/2014 1002    Home fasting blood sugars: 90-170s in past 10 days 2 hour post-meal/random blood sugars: 180-190s in past 10 days with one value of 205 (only 4 values reported)   Clinical Atherosclerotic Cardiovascular Disease (ASCVD): No  The 10-year ASCVD risk score Meredith Maynard DC Jr., et al., 2013) is: 4.8%   Values used to calculate the score:     Age: 54 years     Sex: Female     Is Non-Hispanic African American: No     Diabetic: Yes     Tobacco smoker: No     Systolic Blood Pressure: [id number] mmHg     Is BP treated: Yes     HDL Cholesterol: 46 mg/dL     Total Cholesterol: 186 mg/dL    A/P: Diabetes longstanding and currently uncontrolled per A1c 9.1% (03/24/2020). Fasting blood glucose improved in last 10 days to be 90-170s due to patient reported eating less carbs (decreased amount of tortillas and rice) Medication adherence appears good. Control is suboptimal  due to low Trulicity dose and suboptimal diet. -Increased dose of GLP-1 Trulicity (generic name dulaglutide) to 1.5mg  once weekly.  -Continued metformin 1000 mg BID. -Continued glipizide 5 mg BID. Instructed patient that if BG's < 100, to discontinue glipizide 5 mg BID. -Encouraged patient to keep working on diet and trying to limit carbohydrate intake.  -Extensively discussed pathophysiology of diabetes, recommended lifestyle interventions, dietary effects on blood sugar control -Counseled on s/sx of and management of hypoglycemia -Next A1C anticipated 3 months.   ASCVD risk - primary prevention in patient with diabetes. Last LDL is not controlled. ASCVD risk  score is not >20%  - moderate intensity statin indicated. Aspirin is not indicated.  -Discontinued pravastatin 40 mg daily. -Initiated atorvastatin 40 mg daily. -Follow-up in 3 months with lipid panel to assess efficacy.   Hypertension longstanding currently controlled.  Blood pressure goal = <130/80 mmHg. Medication adherence good.   -Continued lisinopril 20 mg daily.  Written patient instructions provided.  Total time in face to face counseling 30 minutes.   Follow up Pharmacist in 2 weeks via phone call to make sure patient received new Trulicity pens and is tolerating new dose well. PCP Clinic Visit with Dr. Ky Maynard on 04/22/2020. Patient seen with Meredith Maynard, PharmD Candidate and Meredith Maynard, PharmD PGY-1 Resident.

## 2020-03-24 NOTE — Patient Instructions (Addendum)
It was great to see you today! Your blood glucose in the morning has improved over the last 10 days due to your diet changes. Please continue to keep working on your diet and limiting your carbs.   We completed the form to have Trulicity mailed to you.   Follow up plan: - Increase Trulicity to 1.5mg  once weekly.  - If your blood glucose is less than 90 please stop taking your glipizide.  - Please stop taking your pravastatin and start taking atorvastatin 40mg  daily.    We will call you in 2 weeks to see how you are doing with the new Trulicity dose.  Mucho gusto en verte hoy! Su nivel de Chief of Staff en la sangre por la maana ha mejorado durante los ltimos [address] debido a los cambios en su dieta. Continue de limitar cuantos carbohydratos esta comiendo durante el dia.   Completamos la forma para que se le enve por correo su Trulicity.  Plan de seguimiento: - Aumente Trulicity a 1.5 mg una vez a la semana. - Si su nivel de Chief of Staff en la sangre es menos de 90, puede dejar de tomar el glipizide. - Por favor deje de tomar su pravastatin y comience a tomar atorvastatin 40 mg una [place].   La llamaremos en 2 semanas para ver como se siente con su nueva dosis de Trulicity.

## 2020-03-24 NOTE — Assessment & Plan Note (Signed)
Diabetes longstanding and currently uncontrolled per A1c 9.1% (03/24/2020). Fasting blood glucose improved in last 10 days to be 90-170s due to patient reported eating less carbs (decreased amount of tortillas and rice) Medication adherence appears good. Control is suboptimal due to low Trulicity dose and suboptimal diet. -Increased dose of GLP-1 Trulicity (generic name dulaglutide) to 1.5mg  once weekly.  -Continued metformin 1000 mg BID. -Continued glipizide 5 mg BID. Instructed patient that if BG's < 100, to discontinue glipizide 5 mg BID. -Encouraged patient to keep working on diet and trying to limit carbohydrate intake.  -Extensively discussed pathophysiology of diabetes, recommended lifestyle interventions, dietary effects on blood sugar control -Counseled on s/sx of and management of hypoglycemia

## 2020-03-31 NOTE — Progress Notes (Signed)
Reviewed: I agree with the Dr. Koval's documentation and management. 

## 2020-04-01 ENCOUNTER — Other Ambulatory Visit: Payer: Self-pay | Admitting: Family Medicine

## 2020-04-01 DIAGNOSIS — E1159 Type 2 diabetes mellitus with other circulatory complications: Secondary | ICD-10-CM

## 2020-04-01 DIAGNOSIS — E1169 Type 2 diabetes mellitus with other specified complication: Secondary | ICD-10-CM

## 2020-04-02 ENCOUNTER — Telehealth: Payer: Self-pay | Admitting: *Deleted

## 2020-04-02 NOTE — Telephone Encounter (Signed)
-----   Message from [place] Pink, [place] sent at 04/02/2020 11:31 AM EDT ----- Regarding: Rx Received shipment of trulicity this morning for this patient. It is in the med fridge.

## 2020-04-02 NOTE — Telephone Encounter (Signed)
Updated patient that her medications came in today and she can come pick them up at her convenience.  Patient will try and come tomorrow.  Levan Aloia,CMA

## 2020-04-22 ENCOUNTER — Encounter: Payer: Self-pay | Admitting: Family Medicine

## 2020-04-22 ENCOUNTER — Ambulatory Visit (INDEPENDENT_AMBULATORY_CARE_PROVIDER_SITE_OTHER): Payer: Self-pay | Admitting: Family Medicine

## 2020-04-22 ENCOUNTER — Other Ambulatory Visit: Payer: Self-pay

## 2020-04-22 VITALS — BP 124/78 | HR 83 | Ht 65.0 in | Wt 236.0 lb

## 2020-04-22 DIAGNOSIS — E1169 Type 2 diabetes mellitus with other specified complication: Secondary | ICD-10-CM

## 2020-04-22 DIAGNOSIS — R1030 Lower abdominal pain, unspecified: Secondary | ICD-10-CM

## 2020-04-22 DIAGNOSIS — E669 Obesity, unspecified: Secondary | ICD-10-CM

## 2020-04-22 DIAGNOSIS — E1159 Type 2 diabetes mellitus with other circulatory complications: Secondary | ICD-10-CM

## 2020-04-22 DIAGNOSIS — I152 Hypertension secondary to endocrine disorders: Secondary | ICD-10-CM

## 2020-04-22 DIAGNOSIS — K5900 Constipation, unspecified: Secondary | ICD-10-CM

## 2020-04-22 DIAGNOSIS — Z1159 Encounter for screening for other viral diseases: Secondary | ICD-10-CM

## 2020-04-22 DIAGNOSIS — I10 Essential (primary) hypertension: Secondary | ICD-10-CM

## 2020-04-22 MED ORDER — POLYETHYLENE GLYCOL 3350 17 GM/SCOOP PO POWD: 17.0000 g | Freq: Every day | ORAL | 1 refills | Status: DC

## 2020-04-22 NOTE — Progress Notes (Signed)
   SUBJECTIVE:   CHIEF COMPLAINT / HPI:   Diabetes, Type 2 - Last A1c 9.1 02/2020 - Medications: Trulicity 1.5mg  weekly, glipizide 5mg  BID, metformin 1000mg  BID - Compliance: good - Checking BG at home: yes, 150-180 fasting. 200s non fasting. - Eye exam: ordered - Foot exam: UTD - Microalbumin: n/a - Statin: yes - Denies symptoms of hypoglycemia, foot ulcers/trauma - wants to see about getting off trulicity as she thinks she is getting pancreatitis from this.   Hypertension: - Medications: lisinopril 20mg  daily - Compliance: good - Checking BP at home: no - Denies any symptoms of hypotension  Constipation - endorsing 2 month h/o constipation and lower abdominal pain, worse in the last 3 weeks.  - when she pushes on belly, it feels hard - strains with BMs - taking antiacids and teas without relief.  - no blood in stool, diarrhea, fevers, vomiting, trouble urinating, vaginal bleeding or discharge. - hasn't tried miralax. - pain always there but intensity comes and goes.  - +nausea.  - h/o umbilical hernia repair 7793, 2017. Csection and BTL. - drinks 2L of water during the day. Working on increasing fruits and vegetables.  - no FH of colon cancer.  Health Maintenance - due for hep C screening, colonoscopy   PERTINENT  PMH / PSH: HTN, T2DM, GERD, morbid obesity  OBJECTIVE:   BP 124/78   Pulse 83   Ht 5\' 5"  (1.651 m)   Wt 236 lb (107 kg)   LMP 02/21/2020 (Approximate)   SpO2 98%   BMI 39.27 kg/m   Gen: obese, in NAD Card: reg rate Resp: normal WOB Abd: soft, TTP in b/l lower quadrants, +BS. No rebound tenderness or guarding.   ASSESSMENT/PLAN:   Constipation Nausea likely 2/2 longstanding with recent worsening, likely 2/2 poor water and fiber intake. Benign abdominal exam with no findings to concern for pancreatitis, cholecystitis, abdominal hernia or bowel incarceration. Instructed on daily miralax, titrate for effect. Obtain BMP and lipase per patient request.  If nausea still present after resumption of normal bowel movements, can look to adjust GLP-1 agonist dosing if needed. F/u in one month.  Diabetes mellitus type 2 in obese ([place]) Remains uncontrolled despite titration of trulicity. Much counseling today regarding lifestyle factors such as diet and exercise. No findings to suggest current pancreatitis, counseling provided regarding need to continue GLP-1 agonist given poor control, will obtain lipase. Nausea likely related to constipation although if still persistent, may need to adjust trulicity dosing. Expect next A1c in 2 months.  Hypertension associated with diabetes ([place]) At goal without orthostatic sx. Continue current regimen.   Health Maintenance - Obtain hep C screening today. Will see about obtaining orange card coverage to aid in obtaining colonoscopy.  Meredith Maynard, Meredith Maynard

## 2020-04-22 NOTE — Patient Instructions (Signed)
  Estuvo muy bien verte!  Nuestros planes para hoy: - Tome miralax una vez al da. Si no tiene evacuaciones intestinales blandas y Archivist, aumente a Games developer. - Si an tiene nuseas despus de defecar regularmente, regrese a la clnica. - No hay otros cambios en sus medicamentos hoy. Melanee Spry en 1 mes.  Estamos revisando algunos laboratorios hoy, lo llamaremos o le enviaremos una carta si son anormales.  Tenga cuidado y busque atencin inmediata antes si desarrolla alguna inquietud.   Dr. Johnsie [facility]

## 2020-04-23 LAB — BASIC METABOLIC PANEL
BUN/Creatinine Ratio: 16 (ref 9–23)
BUN: 9 mg/dL (ref 6–24)
CO2: 29 mmol/L (ref 20–29)
Calcium: 9.6 mg/dL (ref 8.7–10.2)
Chloride: 101 mmol/L (ref 96–106)
Creatinine, Ser: 0.56 mg/dL — ABNORMAL LOW (ref 0.57–1.00)
GFR calc Af Amer: 123 mL/min/{1.73_m2} (ref >59)
GFR calc non Af Amer: 107 mL/min/{1.73_m2} (ref >59)
Glucose: 139 mg/dL — ABNORMAL HIGH (ref 65–99)
Potassium: 4 mmol/L (ref 3.5–5.2)
Sodium: 139 mmol/L (ref 134–144)

## 2020-04-23 LAB — LIPASE: Lipase: 45 U/L (ref 14–72)

## 2020-04-23 LAB — HEPATITIS C ANTIBODY: Hep C Virus Ab: <0.1 s/co ratio (ref 0.0–0.9)

## 2020-04-23 NOTE — Assessment & Plan Note (Signed)
At goal without orthostatic sx. Continue current regimen.

## 2020-04-23 NOTE — Assessment & Plan Note (Addendum)
Nausea likely 2/2 longstanding with recent worsening, likely 2/2 poor water and fiber intake. Benign abdominal exam with no findings to concern for pancreatitis, cholecystitis, abdominal hernia or bowel incarceration. Instructed on daily miralax, titrate for effect. Obtain BMP and lipase per patient request. If nausea still present after resumption of normal bowel movements, can look to adjust GLP-1 agonist dosing if needed. F/u in one month.

## 2020-04-23 NOTE — Assessment & Plan Note (Signed)
Remains uncontrolled despite titration of trulicity. Much counseling today regarding lifestyle factors such as diet and exercise. No findings to suggest current pancreatitis, counseling provided regarding need to continue GLP-1 agonist given poor control, will obtain lipase. Nausea likely related to constipation although if still persistent, may need to adjust trulicity dosing. Expect next A1c in 2 months.

## 2020-04-27 ENCOUNTER — Telehealth: Payer: Self-pay | Admitting: Family Medicine

## 2020-04-29 ENCOUNTER — Other Ambulatory Visit: Payer: Self-pay

## 2020-04-29 ENCOUNTER — Ambulatory Visit (INDEPENDENT_AMBULATORY_CARE_PROVIDER_SITE_OTHER): Payer: Self-pay | Admitting: Family Medicine

## 2020-04-29 DIAGNOSIS — K0510 Chronic gingivitis, plaque induced: Secondary | ICD-10-CM

## 2020-04-29 DIAGNOSIS — S00522A Blister (nonthermal) of oral cavity, initial encounter: Secondary | ICD-10-CM

## 2020-04-29 MED ORDER — AMOXICILLIN 500 MG PO CAPS: 1000.0000 mg | ORAL_CAPSULE | Freq: Two times a day (BID) | ORAL | 0 refills | Status: AC

## 2020-04-29 NOTE — Patient Instructions (Addendum)
It was nice to meet you today,  I have prescribed you 7 days of an antibiotic.  You can take this twice a day.  Each time you take it you should take 2 pills.  There are 28 pills in total, this should last to 7 days.  In the meantime I would like you to call your dentist and tell them that your doctor said it was okay to operate with your blood sugar levels where they are.  Also tell them that you are having some inflammation and that you were just started on an antibiotic.  I would continue taking the anti-inflammatory medicine that you have.  You should take this medicine every 12 hours or twice a day.  Please come back if you are having any fevers vomiting or trouble swallowing.  Otherwise you can follow-up after your tooth is removed if you still have concerns.  Have a great day,  Clemetine Marker, MD

## 2020-04-29 NOTE — Progress Notes (Signed)
    SUBJECTIVE:   CHIEF COMPLAINT / HPI:   Inflamed gums: Patient states that she broke a tooth in the left upper side a few weeks ago.  She has a Pharmacist, community and made an appointment to get it pulled, but stated that the dentist wanted clearance from her primary physician first due to her diabetes.  After she gained clearance she has not rescheduled the appointment because at that time she developed swelling in the gums near the site of the broken tooth and remembers during previous dental visits they stated they would not remove her tooth while active swelling was occurring.   PERTINENT  PMH / PSH: diabetes  OBJECTIVE:   BP 128/78   Pulse 72   Ht 5\' 5"  (1.651 m)   Wt 239 lb 9.6 oz (108.7 kg)   SpO2 97%   BMI 39.87 kg/m   Gen: alert. No acute distress.  Mouth: tooth in left upper side of mouth (tooth 11 or 12) broken off at the gumline on the inner side.  No drainage from tooth, but the outer gumline above this area has an area of fluctuance with no drainage.  No bleeding seen. No purulence.  Tender to palpation over the area of the swelling.  Poor dentition in general.    ASSESSMENT/PLAN:   Blister of gum with infection Pt has fluctuant area of her gums in the left upper quadrant above a broken tooth.  She has a dentist but did not set up an appointment yet because of the swelling.  Advised pt to call her dentist, inform her of the swelling and try to set up appointment to remove the tooth.  In the meantime we will treat with abx for likely infection of the gums with 7 days of amoxicillin.       Benay Pike, MD [place]

## 2020-05-01 DIAGNOSIS — S00522A Blister (nonthermal) of oral cavity, initial encounter: Secondary | ICD-10-CM | POA: Insufficient documentation

## 2020-05-01 DIAGNOSIS — S025XXA Fracture of tooth (traumatic), initial encounter for closed fracture: Secondary | ICD-10-CM | POA: Insufficient documentation

## 2020-05-01 DIAGNOSIS — K0510 Chronic gingivitis, plaque induced: Secondary | ICD-10-CM | POA: Insufficient documentation

## 2020-05-01 NOTE — Assessment & Plan Note (Signed)
Pt has fluctuant area of her gums in the left upper quadrant above a broken tooth.  She has a dentist but did not set up an appointment yet because of the swelling.  Advised pt to call her dentist, inform her of the swelling and try to set up appointment to remove the tooth.  In the meantime we will treat with abx for likely infection of the gums with 7 days of amoxicillin.

## 2020-06-04 ENCOUNTER — Ambulatory Visit: Payer: Self-pay | Admitting: Family Medicine

## 2020-06-05 ENCOUNTER — Other Ambulatory Visit: Payer: Self-pay

## 2020-06-05 ENCOUNTER — Ambulatory Visit (INDEPENDENT_AMBULATORY_CARE_PROVIDER_SITE_OTHER): Payer: Self-pay | Admitting: Family Medicine

## 2020-06-05 VITALS — BP 118/60 | HR 87 | Wt 235.0 lb

## 2020-06-05 DIAGNOSIS — M545 Low back pain, unspecified: Secondary | ICD-10-CM

## 2020-06-05 DIAGNOSIS — M5441 Lumbago with sciatica, right side: Secondary | ICD-10-CM

## 2020-06-05 MED ORDER — IBUPROFEN 800 MG PO TABS: 800.0000 mg | ORAL_TABLET | Freq: Four times a day (QID) | ORAL | 0 refills | Status: AC | PRN

## 2020-06-05 MED ORDER — BACLOFEN 10 MG PO TABS: 10.0000 mg | ORAL_TABLET | Freq: Three times a day (TID) | ORAL | 0 refills | Status: DC

## 2020-06-05 NOTE — Patient Instructions (Addendum)
Back Pain Treatment - you should: take ibuprofen and tylenol every 6 hours in alternating pattern, only take the ibuprofen for 5 days to prevent kidney injury. Also you may take a muscle relaxant, baclofen, up to 3 times a day as needed. Only take baclofen if you will not be driving a car as it makes you sleepy. You should be better in: you should feel better in 5 days, but all the way better in a few weeks. Call us or go to the ER if you lose control of your bowels or bladder or are weak in one leg . Come back to see [place] if you have no improvement in 1-2 weeks.  Dolor de espalda Tratamiento: debe: tomar ibuprofeno y tylenol cada 6 horas en un patrn alterno, solo tome el ibuprofeno durante 5 das para prevenir una lesin renal. Tambin puede tomar un relajante muscular, baclofeno, hasta 3 veces al da segn sea necesario. Solo tome baclofeno si no va a conducir un automvil, ya que [employer]. Debera estar mejor en: debera sentirse mejor en 5 das, pero completamente mejor en unas pocas semanas. Llmenos o vaya a la sala de emergencias si pierde el control de sus intestinos o vejiga o si tiene una pierna dbil. Vuelva a vernos si no ha mejorado en 1-2 semanas.   Citica Sciatica  La citica es el dolor, debilidad, hormigueo o prdida de la sensibilidad (adormecimiento) a lo largo del nervio citico. El nervio citico comienza en la parte inferior de la espalda y desciende por la parte posterior de cada pierna. Suele desaparecer por s sola o con tratamiento. A veces, la citica puede volver a aparecer (ser recurrente). Cules son las causas? Esta afeccin se produce cuando el nervio citico se comprime o se ejerce presin sobre l. Esto puede ser el resultado de:  Un disco que sobresale demasiado entre los huesos de la columna vertebral (hernia de disco).  Los cambios que se producen [employer] discos vertebrales al Veterinary surgeon.  Una afeccin en un msculo de las nalgas.  Un crecimiento seo  adicional cerca del nervio citico.  Una rotura (fractura) de la zona que est entre los huesos de la cadera (pelvis).  Embarazo.  Tumor. Esto es poco frecuente. Qu incrementa el riesgo? Es ms probable que tengan esta afeccin las personas que:  Therapist, occupational deportes que ponen presin o tensin sobre la columna vertebral.  Tienen poca fuerza y facilidad de movimiento (flexibilidad).  Han tenido una lesin en la espalda en el pasado.  Han tenido una ciruga en la espalda.  Permanecen sentadas durante largos perodos.  Realizan actividades que implican agacharse o levantar objetos una y [place].  Tienen mucho sobrepeso (es obeso). Cules son los signos o los sntomas? Los sntomas pueden variar de leves a muy graves. Pueden incluir los siguientes:  Cualquiera de los siguientes problemas en la parte inferior de la espalda, piernas, cadera o nalgas: ? Hormigueo leve, prdida de la sensibilidad o dolor sordo. ? Sensacin de ardor. ? Dolor agudo.  Prdida de la sensibilidad en la parte posterior de la pantorrilla o la planta del pie.  Debilidad en las piernas.  Dolor muy intenso en la espalda que dificulta el movimiento. Estos sntomas pueden empeorar al toser, Brewing technologist o rer. Tambin pueden empeorar al sentarse o estar de pie durante largos perodos. Cmo se trata? A menudo, esta afeccin mejora sin tratamiento. Sin embargo, el tratamiento puede incluir:  Quarry manager de [place] fsica o reducirla cuando siente dolor.  Hacer ejercicios  y estiramientos.  Aplicar hielo o calor sobre la zona afectada.  Medicamentos para lo siguiente: ? Aliviar el dolor y la inflamacin. ? Relajar los msculos.  Inyecciones de medicamentos que ayudan a Best boy, la irritacin y la hinchazn.  Ciruga. Siga estas instrucciones en su casa: Medicamentos  [employer] de venta libre y los recetados solamente como se lo haya indicado el mdico.  Consulte a su mdico si  el medicamento que le recetaron: ? Hace que sea necesario que evite conducir o usar maquinaria pesada. ? Puede causarle dificultad para defecar (estreimiento). Es posible que deba tomar estas medidas para prevenir o tratar los problemas para defecar:  Electronics engineer suficiente lquido para Contractor pis (la orina) de color amarillo plido.  Tomar medicamentos recetados o de [employer].  Comer alimentos ricos en fibra. Entre ellos, frijoles, cereales integrales y frutas y verduras frescas.  Limitar los alimentos con alto contenido de grasa y Location manager. Estos incluyen alimentos fritos o dulces. Control del dolor      Si se lo indican, aplique hielo en la zona afectada. ? Ponga el hielo en una bolsa plstica. ? Coloque una [employer] piel y Therapist, nutritional. ? Coloque el hielo durante 4minutos, 2 a 3veces por da.  Si se lo indican, aplique calor en la zona afectada. Use la fuente de calor que el mdico le indique, por ejemplo, una compresa de calor hmedo o una almohadilla trmica. ? Coloque una [employer] piel y la fuente de Freight forwarder. ? Aplique calor durante 20 a 75minutos. ? Retire la fuente de calor si la piel se pone de color rojo brillante. Esto es muy importante si no puede Education officer, environmental, calor o fro. Puede correr un riesgo mayor de sufrir quemaduras. Actividad   Retome sus actividades habituales como se lo haya indicado el mdico. Pregntele al mdico qu actividades son seguras para usted.  Evite las [employer] sntomas.  Descanse por breves perodos Agricultural consultant. ? Cuando descanse durante perodos ms largos, haga alguna actividad fsica o un estiramiento entre los perodos de descanso. ? Evite estar sentado durante largos perodos sin moverse. Levntese y [place] al menos una vez cada hora.  Haga ejercicios y estrese con regularidad, como se lo indic el mdico.  No levante nada que pese ms de 10libras (4.5kg) mientras tenga sntomas de  citica. ? Aunque no tenga sntomas, evite levantar objetos pesados. ? Evite levantar objetos pesados de forma repetida.  Al levantar objetos, hgalo siempre de una forma que sea segura para su cuerpo. Para esto, debe hacer lo siguiente: ? [place]. ? Mantenga el objeto cerca del cuerpo. ? No gire el cuerpo. Instrucciones generales  Mantenga un peso saludable.  Use calzado cmodo, que le d soporte al pie. Evite usar tacones.  Evite dormir sobre un colchn que sea demasiado blando o demasiado duro. Es posible que sienta menos dolor si duerme en un colchn con apoyo suficientemente firme para la espalda.  Concurra a todas las visitas de seguimiento como se lo haya indicado el mdico. Esto es importante. Comunquese con un mdico si:  Tiene un dolor con estas caractersticas: ? Lo despierta cuando est dormido. ? Empeora al estar recostado. ? Es Event organiser que tena en el pasado. ? Dura ms de 4semanas.  Pierde peso sin proponrselo. Solicite ayuda inmediatamente si:  No puede controlar la orina (miccin) ni la evacuacin de la materia fecal (defecacin).  Tiene debilidad en alguna de  estas zonas, y la debilidad empeora: ? La parte inferior de la espalda. ? La zona que se encuentra entre los [employer] caderas. ? Las nalgas. ? Las piernas.  Siente irritacin o inflamacin en la espalda.  Tiene sensacin de ardor al [employer]. Resumen  La citica es el dolor, debilidad, hormigueo o prdida de la sensibilidad (adormecimiento) a lo largo del nervio citico.  Esta afeccin se produce cuando el nervio citico se comprime o se ejerce presin sobre l.  La citica puede Engineer, drilling, hormigueo o prdida de la sensibilidad (adormecimiento) en la parte inferior de la espalda, las piernas, las caderas y las nalgas.  El tratamiento a menudo incluye reposo, ejercicio, medicamentos y Midwife hielo o calor en la zona afectada. Esta informacin no tiene Hydrologist el consejo del mdico. Asegrese de hacerle al mdico cualquier pregunta que tenga. Document Revised: 12/12/2018 Document Reviewed: 12/12/2018 Elsevier Patient Education  [place].

## 2020-06-05 NOTE — Assessment & Plan Note (Signed)
2 weeks of right sided-low back pain, some radiculopathy, mostly muscle cramping and tightness. Likely some component of sciatica with muscle spasms.  - Referral to PT per patient request (improved last time) - No red flag symptoms - Ibuprofen 800 mg q6h alternating with tylenol x 5 days - Baclofen 10 mg TID PRN - Continue heat as needed for relief - Continue gentle walking, stretching- no bed rest - Go to ED if red flag symptoms present, or RTC if no improvement in 2-4 weeks or as needed (see AVS)

## 2020-06-05 NOTE — Progress Notes (Signed)
    SUBJECTIVE:   CHIEF COMPLAINT / HPI: right-sided low back pain  Patient has history of sciatica diagnosis about 5 years ago, got better with PT. 2 Saturdays ago she started having pain in her Right low back, right glute, and right thigh. The pain is mostly cramping in sensation and gets better with application of heat, but comes back after a few hours. She was not lifting or performing other strenuous work when her pain started. She does not have numbness, but sometimes tingling. She denies saddle anesthesia or incontinence. She has also tried aleve once to twice a day with minimal relief. Her main concern is that her right hamstring and glute feel "tight" and like she cannot take a full step.   PERTINENT  PMH / PSH: obesity, DMT2, sciatica  OBJECTIVE:   BP 118/60   Pulse 87   Wt 235 lb (106.6 kg)   LMP 06/04/2020 (Exact Date)   SpO2 96%   BMI 39.11 kg/m   Physical Exam Vitals and nursing note reviewed.  Constitutional:      General: She is not in acute distress.    Appearance: Normal appearance. She is obese. She is not ill-appearing, toxic-appearing or diaphoretic.  HENT:     Head: Normocephalic and atraumatic.  Musculoskeletal:     Comments: Back: no paraspinal tenderness to palpation b/l, no stepoffs on central column, mild tenderness to palpation over right SI/gluteus. No bruises, erythema, or edema present on exam.  SLR: (+) some tingling in right thigh on ipsilateral and contralateral exams, recreates pain. ROM of R hip limited by pain and tight hamstring muscle  Skin:    General: Skin is warm and dry.  Neurological:     General: No focal deficit present.     Mental Status: She is alert. Mental status is at baseline.  Psychiatric:        Mood and Affect: Mood normal.        Behavior: Behavior normal.    ASSESSMENT/PLAN:   Right-sided low back pain with right-sided sciatica 2 weeks of right sided-low back pain, some radiculopathy, mostly muscle cramping and  tightness. Likely some component of sciatica with muscle spasms.  - Referral to PT per patient request (improved last time) - No red flag symptoms - Ibuprofen 800 mg q6h alternating with tylenol x 5 days - Baclofen 10 mg TID PRN - Continue heat as needed for relief - Continue gentle walking, stretching- no bed rest - Go to ED if red flag symptoms present, or RTC if no improvement in 2-4 weeks or as needed (see AVS)    Gladys Damme, MD [place]

## 2020-06-24 ENCOUNTER — Ambulatory Visit (INDEPENDENT_AMBULATORY_CARE_PROVIDER_SITE_OTHER): Payer: Self-pay | Admitting: Family Medicine

## 2020-06-24 ENCOUNTER — Other Ambulatory Visit: Payer: Self-pay

## 2020-06-24 VITALS — BP 112/62 | HR 98 | Ht 64.5 in | Wt 235.2 lb

## 2020-06-24 DIAGNOSIS — E669 Obesity, unspecified: Secondary | ICD-10-CM

## 2020-06-24 DIAGNOSIS — E1169 Type 2 diabetes mellitus with other specified complication: Secondary | ICD-10-CM

## 2020-06-24 DIAGNOSIS — H1131 Conjunctival hemorrhage, right eye: Secondary | ICD-10-CM

## 2020-06-24 DIAGNOSIS — R519 Headache, unspecified: Secondary | ICD-10-CM

## 2020-06-24 LAB — POCT GLYCOSYLATED HEMOGLOBIN (HGB A1C): HbA1c, POC (controlled diabetic range): 8.1 % — AB (ref 0.0–7.0)

## 2020-06-24 LAB — SEDIMENTATION RATE: Sed Rate: 24 mm/hr (ref 0–40)

## 2020-06-24 MED ORDER — PREDNISONE 50 MG PO TABS: ORAL_TABLET | ORAL | 0 refills | Status: DC

## 2020-06-24 NOTE — Patient Instructions (Signed)
Arteritis temporal Temporal Arteritis  La arteritis temporal es una afeccin que hace que las arterias se inflamen. Por lo general, afecta las arterias de la cabeza y la cara, pero pueden inflamarse las arterias de cualquier parte del cuerpo. La afeccin tambin se conoce como arteritis de clulas gigantes.  La arteritis temporal puede causar problemas graves, como ceguera. El tratamiento temprano puede ayudar a Facilities manager. Cules son las causas? Se desconoce la causa de esta afeccin. Qu incrementa el riesgo? Los siguientes factores pueden hacer que usted sea ms propenso a Best boy esta afeccin:  Ser mayor de 24MWN.  Ser mujer.  Ser de Science writer.  Tener ascendencia danesa, suiza, finlandesa, noruega o islandesa.  Tener antecedentes familiares de esta afeccin.  Tener una afeccin que causa dolor y rigidez muscular (polimialgia reumtica, PMR). Cules son los signos o sntomas? Algunas personas con arteritis temporal tienen un solo sntoma, mientras que otras tienen varios sntomas. La mayora de los sntomas se relacionan con la cabeza y la cara. Estos pueden incluir lo siguiente:  Dolor de [place].  Sienes duras o hinchadas. Esto es frecuente. Las sienes son las reas que estn a ambos lados de la frente. Si las sienes estn hinchadas, puede sentir dolor al tocarlas.  Dolor al peinarse o al apoyar la cabeza al recostarse.  Dolor mandibular al [employer].  Dolor de Psychologist, forensic.  Problemas de visin, por ejemplo, prdida repentina de la visin en un ojo o visin doble. Otros sntomas pueden incluir lo siguiente:  Systems analyst.  Cansancio (fatiga).  Tos seca.  Dolor en las caderas o los hombros.  Dolor en los brazos al hacer ejercicio.  Depresin.  Prdida de peso. Cmo se diagnostica? Esta afeccin se puede diagnosticar en funcin de lo siguiente:  Sus sntomas.  Sus antecedentes mdicos.  Un examen fsico.  Otros estudios que  pueden realizarle incluyen: ? Anlisis de [place]. ? Una prueba en la que se extrae [place] de tejido de una arteria, de modo que pueda ser examinada (biopsia). ? Pruebas de diagnstico por imgenes, como una ecografa o una resonancia magntica (RM). Cmo se trata? El tratamiento de esta afeccin puede incluir:  Un tipo de medicamento para reducir la inflamacin (corticoesteroide).  Medicamentos que debilitan el sistema inmunitario (inmunosupresores).  Otros medicamentos para tratar los problemas de visin. Deber ver al mdico mientras reciba tratamiento. Los medicamentos que se usan para tratar Personnel officer pueden aumentar el riesgo de tener problemas como prdida sea y diabetes. Durante las visitas de control, el mdico har lo siguiente para Hydrographic surveyor problemas:  Anlisis de sangre y pruebas de densidad sea.  Verificar su presin arterial y Immunologist. Siga estas indicaciones en su casa: Medicamentos  [employer] de venta libre y los recetados solamente como se lo haya indicado el [place] o los suplementos vitamnicos que le haya recomendado el mdico. Estos pueden incluir vitaminaD y calcio, que ayudan a Product/process development scientist que los huesos se debiliten. Comida y bebida   Siga una dieta cardiosaludable. Esto puede incluir: ? Consumir alimentos ricos en fibra, como frutas y verduras frescas, cereales integrales y frijoles. ? Consumir grasas saludables para el corazn (grasas omega3), como pescado, lino y aceite de [place]. ? Limitar el consumo de alimentos ricos en grasas saturadas y colesterol, como los alimentos procesados y fritos, carne con grasa y lcteos enteros. ? Limitar la cantidad de sal (sodio) que consume.  Incluya calcio y vitamina D en su dieta.  Las fuentes adecuadas de calcio y vitaminaD incluyen: ? Productos lcteos con bajo contenido de [place], [place], yogur y [place]. ? Ciertos pescados, como el salmn, el atn y las  sardinas, frescos o enlatados. ? Productos que contengan calcio y vitamina D agregados (productos fortificados), como los cereales o los jugos fortificados. Indicaciones generales  Actividad fsica. Consulteal mdico sobre los tipos de ejercicios convenientes para usted. A menudo, se recomiendan los ejercicios que aumentan la frecuencia cardaca (ejercicio aerbico), Administrator, sports. El ejercicio aerbico ayuda a Chief Technology Officer la presin arterial y a Publishing rights manager prdida sea.  Est al da con todas las vacunas segn se lo haya indicado el mdico.  Concurra a todas las visitas de control como se lo haya indicado el mdico. Esto es importante. Comunquese con un mdico si:  Los sntomas empeoran.  Tiene signos de infeccin, como fiebre, hinchazn, enrojecimiento, calor y dolor con la palpacin. Solicite ayuda de inmediato si:  Pierde la visin.  El dolor no desaparece, incluso despus de tomar medicamentos.  Siente dolor en el pecho.  Tiene dificultad para respirar.  Repentinamente, siente adormecimiento o debilidad en un lado del rostro o del cuerpo. Estos sntomas pueden representar un problema grave que constituye Engineer, maintenance (IT). No espere a ver si los sntomas desaparecen. Solicite atencin mdica de inmediato. Comunquese con el servicio de emergencias de su localidad (911 en los [place]). No conduzca por sus propios medios [employer]. Resumen  La arteritis temporal es una afeccin que hace que las arterias se inflamen. Por lo general, afecta las arterias de la cabeza y la cara.  Esta afeccin puede causar problemas graves, como ceguera. El tratamiento puede ayudar a prevenir [employer].  Los sntomas pueden incluir notar las sienes duras o sensibles al tacto, Social research officer, government en la mandbula al Engineer, manufacturing systems, problemas de visin o dolor en las caderas y los hombros.  Tome los medicamentos recetados y de venta libre como se lo haya indicado el mdico. Esta informacin no tiene Buyer, retail el consejo del mdico. Asegrese de hacerle al mdico cualquier pregunta que tenga. Document Revised: 01/29/2018 Document Reviewed: 01/29/2018 Elsevier Patient Education  [place].

## 2020-06-24 NOTE — Progress Notes (Signed)
SUBJECTIVE:   CHIEF COMPLAINT / HPI: headache  [facility] Patient reports at around 8pm. She reports pain on inner eye and with eye movements. Denies any visual disturbances, photophobia, itching or drainage from eyes. No recent trauma.  She reports having had similar symptoms in the past the spontaneously resolved.    Headache New sudden onset of severe headache that started at 4 am this morning.  She describes the pain shooting up from right eye to temporal area to top of head and now head pressure.  She took ASA 81 mg x 2 and Lisinopril 20 mg at that time as she thought her BP was elevated and reports seemed to help a little. She continues to have headache but is now milder. Denies any nasal drainage or fevers.  Reports intermittent nasal congestion.  She takes Cetirizine as needed. She has no history of migraines and has never had headache like this.  Denies any weakness, decrease in sensation, slurred speech or difficulty ambulating.     PERTINENT  PMH / PSH:  DM Type 2 HTN  OBJECTIVE:   BP 112/62   Pulse 98   Ht 5' 4.5" (1.638 m)   Wt 235 lb 3.2 oz (106.7 kg)   LMP 06/04/2020 (Exact Date)   SpO2 97%   BMI 39.75 kg/m    General: Alert and oriented, no apparent distress  Eyes: PEERLA, Visual acuity wnl ENTM: Right temporal tenderness, temporal pulse present. No pharyengeal erythema Neck: nontender Cardiovascular: RRR with no murmurs noted Respiratory: CTA bilaterally  Gastrointestinal: Bowel sounds present. No abdominal pain MSK: Upper extremity strength 5/5 bilaterally, Lower extremity strength 5/5 bilaterally  Derm: No rashes noted Neuro: CN III-XII intact, sensation, motor and gait intact.  No facial asymetry or slurred speech. Psych: Behavior and speech appropriate to situation  ASSESSMENT/PLAN:   Headache Acute onset of severe headache.  Considered SAH but Neuro exam shows no focal deficits and headache relieved with ASA and Lisinopril.  Normotensive today, no  recorded elevated BP last night so unclear if HTN contributed to severe headache.  Patient has pain on palpation over right temporal area so considered Temporal arteritis as cause of headache.   -CBC, Bmet, ESR today -Start Prednisone 50 mg x 7/7, if ESR wnl can discontinue, if not continue and refer to Vascular for temporal biopsy -ESR 22, called patient and she is aware to discontinue steroid and cancel Vascular referral -MRI head negative for intracranial abnormality or hemorrhage. -Follow up with PCP for hyperglycemia and low MCV, advise to start Iron supplements  Subconjunctival hemorrhage of right eye Likely red eye secondary to subconjunctival hemorrhage.  Risk factors include ASA therapy, HTN and uncontrolled DM.  On exam hemorrhagic area noted to medial sclera, no conjunctival redness, pupils equal and reactive, no photophobia.  Patient has had similar symptoms in the past and was recently seen by eye doctor and reports not concerns.  Considered Iritis but ESR negative.  No history of glaucoma, less likely given recent eye exam normal, per patient report.   -Visual acuity wnl -Continue to monitor -If worsens consider Ophthalmology consult  -Follow up with PCP      Tanya Walsh, MD [facility]  

## 2020-06-25 ENCOUNTER — Ambulatory Visit (HOSPITAL_COMMUNITY)
Admission: RE | Admit: 2020-06-25 | Discharge: 2020-06-25 | Disposition: A | Discharge status: Home / Self Care | Payer: Self-pay | Source: Ambulatory Visit | Attending: Family Medicine | Admitting: Family Medicine

## 2020-06-25 DIAGNOSIS — R519 Headache, unspecified: Secondary | ICD-10-CM | POA: Insufficient documentation

## 2020-06-25 LAB — BASIC METABOLIC PANEL
BUN/Creatinine Ratio: 16 (ref 9–23)
BUN: 9 mg/dL (ref 6–24)
CO2: 25 mmol/L (ref 20–29)
Calcium: 9.4 mg/dL (ref 8.7–10.2)
Chloride: 103 mmol/L (ref 96–106)
Creatinine, Ser: 0.56 mg/dL — ABNORMAL LOW (ref 0.57–1.00)
GFR calc Af Amer: 123 mL/min/{1.73_m2} (ref >59)
GFR calc non Af Amer: 107 mL/min/{1.73_m2} (ref >59)
Glucose: 239 mg/dL — ABNORMAL HIGH (ref 65–99)
Potassium: 4.2 mmol/L (ref 3.5–5.2)
Sodium: 140 mmol/L (ref 134–144)

## 2020-06-25 LAB — CBC WITH DIFFERENTIAL/PLATELET
Basophils Absolute: 0.1 10*3/uL (ref 0.0–0.2)
Basos: 2 %
EOS (ABSOLUTE): 0.1 10*3/uL (ref 0.0–0.4)
Eos: 1 %
Hematocrit: 32.2 % — ABNORMAL LOW (ref 34.0–46.6)
Hemoglobin: 9.6 g/dL — ABNORMAL LOW (ref 11.1–15.9)
Immature Grans (Abs): 0 10*3/uL (ref 0.0–0.1)
Immature Granulocytes: 0 %
Lymphocytes Absolute: 1.2 10*3/uL (ref 0.7–3.1)
Lymphs: 20 %
MCH: 21.9 pg — ABNORMAL LOW (ref 26.6–33.0)
MCHC: 29.8 g/dL — ABNORMAL LOW (ref 31.5–35.7)
MCV: 73 fL — ABNORMAL LOW (ref 79–97)
Monocytes Absolute: 0.3 10*3/uL (ref 0.1–0.9)
Monocytes: 5 %
Neutrophils Absolute: 4.5 10*3/uL (ref 1.4–7.0)
Neutrophils: 72 %
Platelets: 230 10*3/uL (ref 150–450)
RBC: 4.39 x10E6/uL (ref 3.77–5.28)
RDW: 17.7 % — ABNORMAL HIGH (ref 11.7–15.4)
WBC: 6.1 10*3/uL (ref 3.4–10.8)

## 2020-06-25 MED ORDER — GADOBUTROL 1 MMOL/ML IV SOLN
10.0000 mL | Freq: Once | INTRAVENOUS | Status: AC | PRN
  Administered 2020-06-25: 10 mL via INTRAVENOUS

## 2020-06-28 ENCOUNTER — Encounter: Payer: Self-pay | Admitting: Family Medicine

## 2020-06-28 DIAGNOSIS — R519 Headache, unspecified: Secondary | ICD-10-CM | POA: Insufficient documentation

## 2020-06-28 DIAGNOSIS — H1131 Conjunctival hemorrhage, right eye: Secondary | ICD-10-CM | POA: Insufficient documentation

## 2020-06-28 NOTE — Assessment & Plan Note (Signed)
Acute onset of severe headache.  Considered SAH but Neuro exam shows no focal deficits and headache relieved with ASA and Lisinopril.  Normotensive today, no recorded elevated BP last night so unclear if HTN contributed to severe headache.  Patient has pain on palpation over right temporal area so considered Temporal arteritis as cause of headache.   -CBC, Bmet, ESR today -Start Prednisone 50 mg x 7/7, if ESR wnl can discontinue, if not continue and refer to Vascular for temporal biopsy -ESR 22, called patient and she is aware to discontinue steroid and cancel Vascular referral -MRI head negative for intracranial abnormality or hemorrhage. -Follow up with PCP for hyperglycemia and low MCV, advise to start Iron supplements

## 2020-06-28 NOTE — Assessment & Plan Note (Signed)
Likely red eye secondary to subconjunctival hemorrhage.  Risk factors include ASA therapy, HTN and uncontrolled DM.  On exam hemorrhagic area noted to medial sclera, no conjunctival redness, pupils equal and reactive, no photophobia.  Patient has had similar symptoms in the past and was recently seen by eye doctor and reports not concerns.  Considered Iritis but ESR negative.  No history of glaucoma, less likely given recent eye exam normal, per patient report.   -Visual acuity wnl -Continue to monitor -If worsens consider Ophthalmology consult  -Follow up with PCP

## 2020-07-07 ENCOUNTER — Other Ambulatory Visit: Payer: Self-pay | Admitting: Family Medicine

## 2020-07-07 DIAGNOSIS — I152 Hypertension secondary to endocrine disorders: Secondary | ICD-10-CM

## 2020-07-07 DIAGNOSIS — E1169 Type 2 diabetes mellitus with other specified complication: Secondary | ICD-10-CM

## 2020-07-07 MED ORDER — LISINOPRIL 20 MG PO TABS: 20.0000 mg | ORAL_TABLET | Freq: Every day | ORAL | 3 refills | Status: DC

## 2020-07-07 NOTE — Telephone Encounter (Signed)
Will forward to MD. Shjon Lizarraga,CMA  

## 2020-07-07 NOTE — Telephone Encounter (Signed)
Pt came to office stating she needs a prescription for BP medication.  Do not have meds for today.  Please call in to [place] at [facility] at [facility] . Ph [phone]

## 2020-07-28 ENCOUNTER — Telehealth: Payer: Self-pay

## 2020-07-28 NOTE — Telephone Encounter (Signed)
[facility] needs to verify Mirna Mires license expiration-usually providers month of birth/day of birth/ current or next year(2021/2022).  Who would I contact for this info or are you able to provide me with this?  We can give it to [place] verbally or refax the script with the license # and expiration written on it.

## 2020-07-28 NOTE — Telephone Encounter (Signed)
Received phone call from patient requesting refill on Trulicity. Patient reports having approx. 2 more weeks of medication. However, patient has been receiving medication through mail pharmacy and wanted to request refill early to give time for processing. Upon chart review, it seems that patient receives medication refills through [facility].   I am unsure how to check status of refills with this patient assistance program. Will include Dr. Valentina Lucks in message for further clarification.   Talbot Grumbling, RN

## 2020-08-05 NOTE — Telephone Encounter (Signed)
I never received Dr. Cecille Rubin license expiration.  We can try to resend the script under another MD possibly.Marland KitchenMarland KitchenMarland Kitchen

## 2020-08-05 NOTE — Telephone Encounter (Signed)
Requested refill for patient.  Meds should be delivered to [facility] by 08/13/20.

## 2020-08-05 NOTE — Telephone Encounter (Signed)
Sent teams message to Gemma Payor with Dr. Cecille Rubin expiration date as 03/29/21. Salvatore Marvel, CMA

## 2020-08-10 ENCOUNTER — Ambulatory Visit: Payer: Self-pay | Admitting: Family Medicine

## 2020-08-11 ENCOUNTER — Telehealth: Payer: Self-pay | Admitting: Family Medicine

## 2020-08-11 ENCOUNTER — Ambulatory Visit (INDEPENDENT_AMBULATORY_CARE_PROVIDER_SITE_OTHER): Payer: Self-pay | Admitting: Family Medicine

## 2020-08-11 ENCOUNTER — Other Ambulatory Visit: Payer: Self-pay

## 2020-08-11 ENCOUNTER — Encounter: Payer: Self-pay | Admitting: Family Medicine

## 2020-08-11 VITALS — BP 104/62 | HR 74 | Wt 231.6 lb

## 2020-08-11 DIAGNOSIS — R109 Unspecified abdominal pain: Secondary | ICD-10-CM

## 2020-08-11 DIAGNOSIS — K5900 Constipation, unspecified: Secondary | ICD-10-CM

## 2020-08-11 DIAGNOSIS — K430 Incisional hernia with obstruction, without gangrene: Secondary | ICD-10-CM

## 2020-08-11 DIAGNOSIS — Z9889 Other specified postprocedural states: Secondary | ICD-10-CM

## 2020-08-11 DIAGNOSIS — Z8719 Personal history of other diseases of the digestive system: Secondary | ICD-10-CM

## 2020-08-11 MED ORDER — POLYETHYLENE GLYCOL 3350 17 GM/SCOOP PO POWD: 17.0000 g | Freq: Two times a day (BID) | ORAL | 1 refills | Status: DC | PRN

## 2020-08-11 NOTE — Assessment & Plan Note (Addendum)
-   last repair in 2017, patient with recent Valsalva event having immediate pain and concern for recurrence of hernia - no signs of obstruction on exam today, abdominal exam without signs of hernia or incarceration, and no vomiting, continuing to have regular BM daily - sent for stat CT abd/pelvis with oral contrast only after discussion with surgery office - if showing recurrence of hernia will send for referral to [facility], Dr Ralene Ok, for discussion of future hernia repair due to patients extensive history of incarceration - discussed strict return precautions with patient including: worsening, consistent abdominal pain, nausea/vomiting, inability to pass gas or have a bowel movement - also starting Miralax BID to soften stools and avoid further abdominal straining

## 2020-08-11 NOTE — Telephone Encounter (Signed)
Called and left VM for patient to call clinic back to update her on plan going forward. Interpreter used at call at 14:11 today was Alma Friendly, [phone].  --  When she calls back, would like to tell her that I have ordered STAT CT abd/pelvis with oral contrast after discussion with the general surgery clinic for further evaluation, and someone should be calling her to schedule that in the next couple of days. In the meantime, if she has any of the symptoms we discussed including nausea/vomiting, constant increasing abdominal pain at hernia site, or not able to pas gas or have a bowel movement, she needs to go to ER due to risk that these could be signs of her hernia getting stuck or strangulated.  Thanks! Leda Quail MD

## 2020-08-11 NOTE — Patient Instructions (Signed)
It was wonderful to see you today.  Please bring ALL of your medications with you to every visit.   Today we talked about:  - I will call your surgeon and get back to you on imaging recommendations - Reasons to call clinic or go to ER: nausea, vomiting, inability to use bathroom - Starting Miralax 2x day for soft stools daily   Thank you for choosing [facility].   Please call [phone] with any questions about today's appointment.  Please be sure to schedule follow up at the front  desk before you leave today.   Yehuda Savannah, MD  Family Medicine

## 2020-08-11 NOTE — Progress Notes (Signed)
    SUBJECTIVE:   CHIEF COMPLAINT / HPI:   54 yo female with PMH T2DM, obesity, HTN who presents for acute concern of hernia pain.  History of recurrent incisional hernia- has had three Cesarean sections and after that had an incision hernia. She has had two hernia repairs, the first in 2007 with repair and also partial small bowel resection for obstruction and the second umbilical hernia surgery March 2017 with Dr Dalbert Batman- exploratory laparotomy with repair of recurrent incisional hernia with Prolene mesh and debridement of skin and subcutaneous tissue of abdominal wall after incarceration. Last week she went to bathroom and was straining due to her constant constipation, and as standing up felt something pull with immediate pain, now sometimes when walks something is pulling with pain and also when she sits up. Not every time, she has been moving carefully, and the pain will come and go, is not constant. Occasionally feels a little nauseous when she has the pain, but no vomiting, no fevers or chills, and has been intermittent over the past week since this happened. She continues to have two small hard BM daily, which she states is normal for her to be constipated. Currently not having any pain.  Interpretor Manolo #[id number] used for entire visit.  PERTINENT  PMH / PSH: as above, she denies other abdominal surgeries besides Cesarean section x3 and incisional hernia repair x2  OBJECTIVE:   BP 104/62   Pulse 74   Wt 231 lb 9.6 oz (105.1 kg)   LMP 06/09/2020 (Approximate)   SpO2 99%   BMI 39.14 kg/m   General: A&O, NAD HEENT: No sign of trauma, EOM grossly intact Respiratory:normal WOB GI: Soft, non-distended, mild TTP along scar from previous operations, no protrusion appreciated even with Valsalva, no guarding or rebound, no pain at site with Valsalva per patient report Extremities: NTTP, no peripheral edema. Neuro: Normal gait, moves all four extremities appropriately. Psych: Appropriate  mood and affect  ASSESSMENT/PLAN:   History of incisional hernia repair - last repair in 2017, patient with recent Valsalva event having immediate pain and concern for recurrence of hernia - no signs of obstruction on exam today, abdominal exam benign, and no vomiting, continuing to have regular BM - sent for stat CT abd/pelvis with oral contrast only after discussion with surgery office - if showing recurrence of hernia will send for referral to [facility], Dr Ralene Ok, for discussion of future hernia repair due to patients extensive history of incarceration - discussed strict return precautions with patient including: worsening, consistent abdominal pain, nausea/vomiting, inability to pass gas or have a bowel movement - also starting Miralax BID to soften stools and avoid further abdominal straining   Called and left VM for patient to call clinic back to discuss that I have ordered STAT CT abd/pelvis with oral contrast after discussion with the general surgery clinic for further evaluation. Interpreter used at call at 14:11 today was [place], [id number].  Lenoria Chime, MD [place]

## 2020-08-14 ENCOUNTER — Telehealth: Payer: Self-pay | Admitting: *Deleted

## 2020-08-14 NOTE — Telephone Encounter (Signed)
Patient is aware that  Medication is here to pick up.  Sarha Bartelt,CMA

## 2020-08-14 NOTE — Telephone Encounter (Signed)
Contacted pt using Meredith Maynard to let her know that her CT is scheduled for 08/17/2020 @ [facility] hospital 1st floor radiology.  She is to arrive at 10 am to begin drinking the contrast and once she completes this the imaging will take place.. Instructed her to not eat or drink after midnight incase she needs to be fasting, told her she could have her medicines that am. She sends her thanks to Dr. Thompson Grayer for being so quick in getting things done for her care. Aeisha Minarik Zimmerman Rumple, CMA

## 2020-08-14 NOTE — Telephone Encounter (Signed)
Medication given to patient

## 2020-08-14 NOTE — Telephone Encounter (Signed)
-----   Message from [place] Pink, [place] sent at 08/13/2020 10:32 AM EDT ----- Regarding: MEDS This patient's trulicity came today, it's in the vaccine fridge.

## 2020-08-14 NOTE — Addendum Note (Signed)
Addended by: Yehuda Savannah E on: 08/14/2020 03:41 PM   Modules accepted: Orders

## 2020-08-17 ENCOUNTER — Ambulatory Visit (HOSPITAL_COMMUNITY)
Admission: RE | Admit: 2020-08-17 | Discharge: 2020-08-17 | Disposition: A | Discharge status: Home / Self Care | Payer: Self-pay | Source: Ambulatory Visit | Attending: Family Medicine | Admitting: Family Medicine

## 2020-08-17 ENCOUNTER — Other Ambulatory Visit: Payer: Self-pay

## 2020-08-17 DIAGNOSIS — R109 Unspecified abdominal pain: Secondary | ICD-10-CM | POA: Insufficient documentation

## 2020-08-17 NOTE — Telephone Encounter (Signed)
Awesome thank you team for scheduling this so quickly.

## 2020-08-18 ENCOUNTER — Telehealth: Payer: Self-pay | Admitting: Family Medicine

## 2020-08-18 NOTE — Telephone Encounter (Signed)
Please see result note for full details, called patient using interpretor services to discuss CT showing no signs of recurrent hernia.

## 2020-09-02 ENCOUNTER — Other Ambulatory Visit: Payer: Self-pay | Admitting: *Deleted

## 2020-09-02 DIAGNOSIS — M4722 Other spondylosis with radiculopathy, cervical region: Secondary | ICD-10-CM

## 2020-09-02 MED ORDER — GABAPENTIN 100 MG PO CAPS: 200.0000 mg | ORAL_CAPSULE | Freq: Two times a day (BID) | ORAL | 3 refills | Status: DC

## 2020-11-05 ENCOUNTER — Telehealth: Payer: Self-pay

## 2020-11-05 NOTE — Telephone Encounter (Signed)
Trulicity came in for pt. Called pt using [employer]. (Christopher,[phone]). LVM informing pt the medication is at the office and she can pick it up at her convince. Sunday Spillers, CMA

## 2020-12-25 ENCOUNTER — Other Ambulatory Visit: Payer: Self-pay

## 2020-12-25 MED ORDER — MECLIZINE HCL 12.5 MG PO TABS: 12.5000 mg | ORAL_TABLET | Freq: Three times a day (TID) | ORAL | 0 refills | Status: DC | PRN

## 2020-12-25 NOTE — Telephone Encounter (Signed)
Patients daughter calls nurse line requesting a refill on Meclizine 12.5mg . Patient has an apt next week with PCP. Please advise.

## 2020-12-29 NOTE — Progress Notes (Addendum)
    SUBJECTIVE:   CHIEF COMPLAINT / HPI:   55 yo female with PMH T2DM, obesity, HTN who presents for follow-up vertigo.  Vertigo- started 15 years ago, takes meclizine and feels like it helps. Not very often that it happens, only ever couple of months. Got a refill for only 30 tablets. On days when she needs it, will take it three times a day. But not taking it every day. Took it Sunday through today three times a day because still feeling dizziness. Sometimes episodes last three days in a row. Is better now than it used to be. Currently when she goes from laying down to sitting up or other position changes it happens. Lately since the problem is more common. Notes she has seen ENT in the past and told she has crystals in her ear, but without insurance is unable to afford vestibular therapy but needs to be able to drive her kids and so meclizine is what has worked for her. No hearing loss, vision changes, chest pain, palpitations, headaches associated with this.   Diabetes: Denies polyuria, polydipsia. Discussed needs dedicated visit for diabetes as A1c 9.9 not well controlled today.   PERTINENT  PMH / PSH: as above  OBJECTIVE:   BP 108/68   Pulse 82   Ht 5\' 5"  (1.651 m)   Wt 225 lb 3.2 oz (102.2 kg)   LMP 11/02/2020 (Exact Date)   SpO2 97%   BMI 37.48 kg/m   General: A&O, NAD HEENT: No sign of trauma, EOM intact, PERRL, no nystagmus noted with EOM but declined Marye Round exam today Respiratory: normal WOB GI: non-distended  Extremities: no peripheral edema. Neuro: Normal gait, moves all four extremities appropriately Skin: no lesions/rashes visualized Psych: Appropriate mood and affect   ASSESSMENT/PLAN:   BPPV (benign paroxysmal positional vertigo) - no records of ENT notes in our chart but patient notes has been present for  >15 years - demonstrated Epley maneuver for her today and given instructions in Spanish for how to do at home - refilled full three months of  meclizine after shared decision making- this has been working for patient, she often does not take for more than 3-4 days in a row, and no red flag signs/symptoms today  Diabetes mellitus type 2 in obese ([place]) - poorly controlled, A1c 9.9 today, patient denying symptoms like polyuria or polydipsia but due to time unable to address fully today - follow-up appointment scheduled in 1 week, patient instructed to bring all of her medications to next appointment, will need eye exam, foot exam, vaccinations updated - BMP and lipid panel checked today, will discuss at follow-up appointment     Lenoria Chime, MD [place]

## 2020-12-30 ENCOUNTER — Other Ambulatory Visit: Payer: Self-pay

## 2020-12-30 ENCOUNTER — Ambulatory Visit (INDEPENDENT_AMBULATORY_CARE_PROVIDER_SITE_OTHER): Payer: Self-pay | Admitting: Family Medicine

## 2020-12-30 ENCOUNTER — Encounter: Payer: Self-pay | Admitting: Family Medicine

## 2020-12-30 VITALS — BP 108/68 | HR 82 | Ht 65.0 in | Wt 225.2 lb

## 2020-12-30 DIAGNOSIS — E1169 Type 2 diabetes mellitus with other specified complication: Secondary | ICD-10-CM

## 2020-12-30 DIAGNOSIS — H8112 Benign paroxysmal vertigo, left ear: Secondary | ICD-10-CM

## 2020-12-30 DIAGNOSIS — E669 Obesity, unspecified: Secondary | ICD-10-CM

## 2020-12-30 LAB — POCT GLYCOSYLATED HEMOGLOBIN (HGB A1C): Hemoglobin A1C: 9.9 % — AB (ref 4.0–5.6)

## 2020-12-30 MED ORDER — MECLIZINE HCL 12.5 MG PO TABS: 12.5000 mg | ORAL_TABLET | Freq: Three times a day (TID) | ORAL | 2 refills | Status: AC | PRN

## 2020-12-30 NOTE — Assessment & Plan Note (Signed)
-   poorly controlled, A1c 9.9 today, patient denying symptoms like polyuria or polydipsia but due to time unable to address fully today - follow-up appointment scheduled in 1 week, patient instructed to bring all of her medications to next appointment, will need eye exam, foot exam, vaccinations updated - BMP and lipid panel checked today, will discuss at follow-up appointment

## 2020-12-30 NOTE — Patient Instructions (Addendum)
It was wonderful to see you today.  Please bring ALL of your medications with you to every visit.   Today we talked about:  - Refilled your vertigo medications - Showed you the Epley maneuver - F/u in 1 week to discuss blood sugars   Thank you for choosing [facility].   Please call [phone] with any questions about today's appointment.  Please be sure to schedule follow up at the front  desk before you leave today.   Yehuda Savannah, MD  Family Medicine    Cmo realizar la [place] de Epley How to Perform the Epley Maneuver La maniobra de Epley es un ejercicio que [place] los sntomas del vrtigo. El vrtigo es la sensacin de que usted o todo lo que lo rodea se mueven cuando en realidad eso no sucede. Cuando una persona siente vrtigo, puede tener la sensacin de que la habitacin da vueltas y puede tener dificultad para caminar. La maniobra de Epley se [place] para un tipo de vrtigo que es causado por un depsito de calcio en una parte del odo interno. La maniobra implica poner la cabeza en distintas posiciones para ayudar a que el depsito salga de la zona. Puede realizar [employer] en su casa cuando tenga los sntomas de vrtigo. Puede repetirla tras 24 horas si el vrtigo no ha desaparecido. Aunque la Yahoo de Engineer, petroleum lo [place] del vrtigo durante algunas semanas, es posible que los sntomas vuelvan. Esta [employer] sntomas del vrtigo, pero no los Pioneer. Cules son los riesgos? Si se realiza de forma correcta, la [place] de Epley se considera un procedimiento seguro. En ocasiones, puede provocar mareos o nuseas que desaparecen despus de un breve perodo. Si tiene otros sntomas, como cambios en la visin, debilidad o entumecimiento, deje de Statistician y llame al mdico. Materiales necesarios:  Una cama o una mesa.  Fermin [place]ab. Cmo hacer la maniobra de Epley 1. Sintese en el borde de una cama o una mesa con la espalda recta y las  piernas extendidas o colgando sobre el borde de la cama o la mesa. 2. Gire la cabeza a medias hacia el odo o el lado [place], como se lo haya indicado el mdico. 3. Recustese hacia atrs rpidamente con la cabeza girada hasta que se encuentre recostado sobre la espalda. Es Medical laboratory scientific officer una almohada debajo de los hombros. 4. Mantenga esta posicin durante 30segundos como mnimo. Si se siente mareado o tiene sntomas de vrtigo, contine sosteniendo la posicin hasta que los sntomas desaparezcan. 5. Gire la cabeza en direccin opuesta hasta que el odo no afectado est orientado al suelo. 6. Mantenga esta posicin durante 30segundos como mnimo. Si se siente mareado o tiene sntomas de vrtigo, contine sosteniendo la posicin hasta que los sntomas desaparezcan. 7. Gire todo el cuerpo [employer] mismo lado que la cabeza de modo que quede recostado de lado. La cabeza ahora estar casi mirando hacia abajo. Mantenga esta posicin durante 30 segundos como mnimo. Si se siente mareado o tiene sntomas de vrtigo, contine sosteniendo la posicin hasta que los sntomas desaparezcan. 8. Vuelva a sentarse. Puede repetir la maniobra tras 24 horas si el vrtigo no desaparece.      Siga estas instrucciones en su casa: [place] 24 horas despus de Statistician de Epley:  Mantenga la cabeza en posicin erguida.  Cuando se acueste para dormir o descansar, mantenga la cabeza levantada (elevada) con dos o ms almohadas.  Evite hacer movimientos excesivos con el cuello.  Actividad  No conduzca vehculos ni opere maquinaria si se siente mareado.  Despus de [employer] de Epley, reanude sus actividades normales como se lo haya indicado el mdico. Pregntele al mdico qu actividades son seguras para usted. Instrucciones generales  Beba suficiente lquido como para mantener la orina de color amarillo plido.  No beba alcohol.  Use los medicamentos de venta libre y los recetados  solamente como se lo haya indicado el mdico.  Consulting civil engineer a todas las visitas de seguimiento como se lo haya indicado el mdico. Esto es importante. Prevencin de los sntomas del vrtigo Pregntele al mdico si debe hacer algo en su casa para evitar el vrtigo. El mdico puede recomendarle lo siguiente:  Theatre manager la cabeza elevada con dos o ms [employer]	[place] duerme.  No dormir sobre el lado del odo afectado.  Levantarse lentamente de la cama.  Evitar los movimientos repentinos Agricultural consultant.  [place] y los movimientos de [place] extremos, como mirar hacia arriba o doblarse. Comunquese con un mdico si:  El vrtigo empeora.  Tiene otros sntomas, como los siguientes: ? Nuseas. ? Vmitos. ? Dolor de [place]. Busque ayuda de inmediato si:  Tiene cambios en la visin.  Tiene dolor de [place] o de cuello que es intenso o que [place].  No puede parar de vomitar.  Siente un nuevo adormecimiento o debilidad en alguna parte del cuerpo. Resumen  El vrtigo es la sensacin de que usted o todo lo que lo rodea se mueven cuando en realidad eso no sucede.  La Yahoo de Epley es un ejercicio que [place] los sntomas del vrtigo.  Si se realiza de forma correcta, la [place] de Epley se considera un procedimiento seguro y [place] el vrtigo rpidamente. Esta informacin no tiene Marine scientist el consejo del mdico. Asegrese de hacerle al mdico cualquier pregunta que tenga. Document Revised: 10/30/2019 Document Reviewed: 10/30/2019 Elsevier Patient Education  [place].

## 2020-12-30 NOTE — Assessment & Plan Note (Signed)
-   no records of ENT notes in our chart but patient notes has been present for  >15 years - demonstrated Epley maneuver for her today and given instructions in Spanish for how to do at home - refilled full three months of meclizine after shared decision making- this has been working for patient, she often does not take for more than 3-4 days in a row, and no red flag signs/symptoms today

## 2020-12-31 LAB — BASIC METABOLIC PANEL
BUN/Creatinine Ratio: 21 (ref 9–23)
BUN: 11 mg/dL (ref 6–24)
CO2: 27 mmol/L (ref 20–29)
Calcium: 9.5 mg/dL (ref 8.7–10.2)
Chloride: 104 mmol/L (ref 96–106)
Creatinine, Ser: 0.52 mg/dL — ABNORMAL LOW (ref 0.57–1.00)
Glucose: 232 mg/dL — ABNORMAL HIGH (ref 65–99)
Potassium: 4.4 mmol/L (ref 3.5–5.2)
Sodium: 144 mmol/L (ref 134–144)
eGFR: 110 mL/min/{1.73_m2} (ref >59)

## 2020-12-31 LAB — LIPID PANEL
Chol/HDL Ratio: 4.8 ratio — ABNORMAL HIGH (ref 0.0–4.4)
Cholesterol, Total: 184 mg/dL (ref 100–199)
HDL: 38 mg/dL — ABNORMAL LOW (ref >39)
LDL Chol Calc (NIH): 74 mg/dL (ref 0–99)
Triglycerides: 457 mg/dL — ABNORMAL HIGH (ref 0–149)
VLDL Cholesterol Cal: 72 mg/dL — ABNORMAL HIGH (ref 5–40)

## 2021-01-01 ENCOUNTER — Telehealth: Payer: Self-pay | Admitting: Family Medicine

## 2021-01-01 NOTE — Progress Notes (Signed)
SUBJECTIVE:   CHIEF COMPLAINT / HPI:   Diabetes: Last three A1C's below. Currently on Trulicity 1.4HF weekly, metformin 1067m BID, and glipizide 568mdaily. Endorses compliance. She notes she never takes the glipizide BID because even just once daily she has episodes of symptomatic hypoglycemia and has to carry juice with her. Notes CBGs range 200s, highest 260. Denies any polyuria, polydipsia, polyphagia. Due for eye exam, foot exam, flu vaccine, and COVID booster.  Lab Results  Component Value Date   HGBA1C 9.9 (A) 12/30/2020   HGBA1C 8.1 (A) 06/24/2020   HGBA1C 9.1 (A) 03/24/2020   Diabetes Preventive Care [x]  Lipids (qYr): last checked on 12/30/2020; 10-year ASCVD risk: 4.4% [x]  Statin: atorvastatin 4054[x]  ASA: taking daily [x]  RFP (qYr): last Cr 0.52 and eGFR >60 on 12/30/2020 [DUE] Urine albWYO:VZCHYIFOYDtio (qYr): <30 on 09/23/2019 (nl < 30), on ACEI [n/a] Smoking cessation: nonsmoker [x]  DM/diet/exercise education: last discussed on today [x]  BP control: well controlled [x]  ACE-I for renal protection: lisinopril 25m31meeds] Glucometer: runs in 200s, notes 260 is the highest [x]  Foot exam: last checked on 01/08/2021 [DUE] Eye exam: due [x]  Pneumococcal vaccine: PPSV23 12/2015 [DUE] Flu vaccine (qYr): pt desires, discussed health department   HLD: Last lipid panel below. Currently on atovastatin 40mg40mly, stopped pravastatin 40mg 29mto muscle cramps. Endorses compliance. Still having cramps in her . The 10-year ASCVD risk score (Goff Mikey Bussing., et al., 2013) is: 4.1%  Labs last week with moderate hypertriglyceridemi non-fasting, denies abdominal pain.    Lab Results  Component Value Date   CHOL 184 12/30/2020   HDL 38 (L) 12/30/2020   LDLCALC 74 12/30/2020   LDLDIRECT 108 (H) 03/12/2014   TRIG 457 (H) 12/30/2020   CHOLHDL 4.8 (H) 12/30/2020   HTN- takes lisinopril daily. Denies side effects. Recent BMP within normal limits.  PERTINENT  PMH / PSH: as  above  OBJECTIVE:   BP 104/62   Pulse 71   Ht 5' 5"  (1.651 m)   Wt 224 lb 12.8 oz (102 kg)   LMP 11/04/2020 (Approximate)   SpO2 96%   BMI 37.41 kg/m   General: A&O, NAD HEENT: No sign of trauma, EOM grossly intact Cardiac: RRR, no m/r/g Respiratory: CTAB, normal WOB, no w/c/r GI: Soft, NTTP, non-distended  Extremities: NTTP, no peripheral edema. Neuro: Normal gait, moves all four extremities appropriately. Psych: Appropriate mood and affect  Dermatologic Exam: Nails: No onchomycosis. No nail bed thickening. Callouses: No callouses. No fissures. Web spaces: No macerations or open lesions Redness/Erythema: None  Musculoskeletal Exam: No bunion, hammertoes, prominent metatarsals, collapsed arch, or previous amputation. Vascular Assessment: Pedal hair growth present. 2+ posterior tibial and dorsalis pedis pulses. Neurologic Exam: 10-gram monofilament exam, R 6/6 and L 6/6.   ASSESSMENT/PLAN:   Hypertension associated with diabetes (HCC) -Enhautrmotensive today, continue home lisinopril  Diabetes mellitus type 2 in obese (HCC) -[place] Trulicity to 3mg we52my, she will use two pens as she recently got a supply of 1.5mg aut61mjectors, discussed side effects including nausea/diarrhea and signs/symptoms of pancreatitis to watch for - discuss to check fasting BG several times per week and bring to next appointment so we can see how her sugars respond - continue metformin, discontinue glipizide as she is having symptomatic hypoglycemia with this - foot exam wnl today - given COVID booster today, given number for Health Department to call to schedule flu shot  Secondary diabetes mellitus with hypercholesterolemia (HCC) - sRandlettl noting muscle aches on the atorvastatin,  which she was switched to from pravastatin, plan to discontinue at this time and see if symptoms improve, she will call office in a week and if muscle aches improved we will consider other medications for  hyperlipidemia    Last mammogram 01/2020 was normal, discussed options for annual versus biannual mammograms, patient prefers annual.   COVID booster offered, given today, discussed screening mammogram needs to be scheduled >6 weeks out, patient in agreement and wanting booster today.  Discussed can get flu vaccine cheapest at the health department.  Lenoria Chime, MD [place]

## 2021-01-01 NOTE — Telephone Encounter (Signed)
Called patient with Spanish speaking interpretor [phone].  Patient did not pick up, left a HIPAA compliant voicemail with our clinic number to call back. Voicemail did state to remind patient of her appointment next week on Friday, 01/08/2021 at 10:50 AM, and to come fasting.  If patient calls back, please let her know her triglyceride levels were high as well as her cholesterol levels, I wanted to confirm if she has been taking her Atorvastatin 40 mg daily. We will plan to repeat her lipid panel fasting next week, if her triglycerides remain high we will make changes to her medications. Kidney function was normal. Please remind patient to bring all of her medications to her next appointment.  Yehuda Savannah MD

## 2021-01-08 ENCOUNTER — Ambulatory Visit (INDEPENDENT_AMBULATORY_CARE_PROVIDER_SITE_OTHER): Payer: Self-pay

## 2021-01-08 ENCOUNTER — Encounter: Payer: Self-pay | Admitting: Family Medicine

## 2021-01-08 ENCOUNTER — Other Ambulatory Visit: Payer: Self-pay

## 2021-01-08 ENCOUNTER — Ambulatory Visit (INDEPENDENT_AMBULATORY_CARE_PROVIDER_SITE_OTHER): Payer: Self-pay | Admitting: Family Medicine

## 2021-01-08 DIAGNOSIS — Z23 Encounter for immunization: Secondary | ICD-10-CM

## 2021-01-08 DIAGNOSIS — E1369 Other specified diabetes mellitus with other specified complication: Secondary | ICD-10-CM

## 2021-01-08 DIAGNOSIS — E669 Obesity, unspecified: Secondary | ICD-10-CM

## 2021-01-08 DIAGNOSIS — T7840XA Allergy, unspecified, initial encounter: Secondary | ICD-10-CM

## 2021-01-08 DIAGNOSIS — I152 Hypertension secondary to endocrine disorders: Secondary | ICD-10-CM

## 2021-01-08 DIAGNOSIS — E1159 Type 2 diabetes mellitus with other circulatory complications: Secondary | ICD-10-CM

## 2021-01-08 DIAGNOSIS — E1169 Type 2 diabetes mellitus with other specified complication: Secondary | ICD-10-CM

## 2021-01-08 DIAGNOSIS — E7800 Pure hypercholesterolemia, unspecified: Secondary | ICD-10-CM

## 2021-01-08 MED ORDER — CETIRIZINE HCL 10 MG PO TABS: 10.0000 mg | ORAL_TABLET | Freq: Every day | ORAL | 11 refills | Status: DC

## 2021-01-08 NOTE — Patient Instructions (Addendum)
It was wonderful to see you today.  Please bring ALL of your medications with you to every visit.   Today we talked about:  - Increase Trulicity to 3mg  daily (two injections per week), monitor for signs of nausea or abdominal pain - Stop atorvastatin 40mg  due to muscle cramping, monitor your muscle cramps and call us back in two weeks and let me know how your symptoms are doing - COVID booster given today - plan for screening mammogram 6 weeks after this vaccine - F/u in one month, come fasting to recheck your cholesterol - Health department number to get flu vaccine: [phone]   Thank you for choosing [facility].   Please call [phone] with any questions about today's appointment.  Please be sure to schedule follow up at the front  desk before you leave today.   Yehuda Savannah, MD  Family Medicine    Diabetes mellitus y nutricin, en adultos Diabetes Mellitus and Nutrition, Adult Si sufre de diabetes, o diabetes mellitus, es muy importante tener hbitos alimenticios saludables debido a que sus niveles de Designer, television/film set sangre (glucosa) se ven afectados en gran medida por lo que come y bebe. [place] alimentos saludables en las cantidades correctas, aproximadamente a la misma hora todos los [place], [place] ayudar a:  Aeronautical engineer glucemia.  Disminuir el riesgo de sufrir una enfermedad cardaca.  Mejorar la presin arterial.  Science writer o mantener un peso saludable. Qu puede afectar mi plan de alimentacin? Todas las personas que sufren de diabetes son diferentes y cada una tiene necesidades diferentes en cuanto a un plan de alimentacin. El mdico puede recomendarle que trabaje con un nutricionista para elaborar el mejor plan para usted. Su plan de alimentacin puede variar segn factores como:  Las caloras que necesita.  Los medicamentos que toma.  Su peso.  Sus niveles de glucemia, presin arterial y colesterol.  Su nivel de [place].  Otras  afecciones que tenga, como enfermedades cardacas o renales. Cmo me afectan los carbohidratos? Los carbohidratos, o hidratos de carbono, afectan su nivel de glucemia ms que cualquier otro tipo de alimento. La ingesta de carbohidratos naturalmente aumenta la cantidad de [employer]. El recuento de carbohidratos es un mtodo destinado a Catering manager un registro de la cantidad de carbohidratos que se consumen. El recuento de carbohidratos es importante para Theatre manager la glucemia a un nivel saludable, especialmente si utiliza insulina o toma determinados medicamentos por va oral para la diabetes. Es importante conocer la cantidad de carbohidratos que se pueden ingerir en cada comida sin correr Engineer, manufacturing. Esto es Psychologist, forensic. Su nutricionista puede ayudarlo a calcular la cantidad de carbohidratos que debe ingerir en cada comida y en cada refrigerio. Cmo me afecta el alcohol? El alcohol puede provocar disminuciones sbitas de la glucemia (hipoglucemia), especialmente si utiliza insulina o toma determinados medicamentos por va oral para la diabetes. La hipoglucemia es una afeccin potencialmente mortal. Los sntomas de la hipoglucemia, como somnolencia, mareos y confusin, son similares a los sntomas de haber consumido demasiado alcohol.  No beba alcohol si: ? Su mdico le indica no hacerlo. ? Est embarazada, puede estar embarazada o est tratando de quedar embarazada.  Si bebe alcohol: ? No beba con el estmago vaco. ? Limite la cantidad que bebe:  De 0 a 1 medida por da para las mujeres.  De 0 a 2 medidas por da para los hombres. ? Est atento a la cantidad de alcohol que hay en las bebidas  que toma. En los [place], una medida equivale a una botella de cerveza de 12oz (386ml), un vaso de vino de 5oz (147ml) o un vaso de una bebida alcohlica de alta graduacin de 1oz (63ml). ? Mantngase hidratado bebiendo agua, refrescos dietticos o t helado sin  azcar.  Tenga en cuenta que los refrescos comunes, los jugos y otras bebida para Optician, dispensing pueden contener mucha azcar y se deben contar como carbohidratos. Consejos para seguir Photographer las etiquetas de los alimentos  Comience por leer el tamao de la porcin en la "Informacin nutricional" en las etiquetas de los alimentos envasados y las bebidas. La cantidad de caloras, carbohidratos, grasas y otros nutrientes mencionados en la etiqueta se basan en una porcin del alimento. Muchos alimentos contienen ms de una porcin por envase.  Verifique la cantidad total de gramos (g) de carbohidratos totales en una porcin. Puede calcular la cantidad de porciones de carbohidratos al dividir el total de carbohidratos por 15. Por ejemplo, si un alimento tiene un total de 30g de carbohidratos totales por porcin, equivale a 2 porciones de carbohidratos.  Verifique la cantidad de gramos (g) de grasas saturadas y grasas trans de una porcin. Escoja alimentos que no contengan estas grasas o que su contenido de estas sea [place].  Verifique la cantidad de miligramos (mg) de sal (sodio) en una porcin. La mayora de las personas deben limitar la ingesta de sodio total a menos de 2300mg  por Training and development officer.  Siempre consulte la informacin nutricional de los alimentos etiquetados como "con bajo contenido de grasa" o "sin grasa". Estos alimentos pueden tener un mayor contenido de Location manager agregada o carbohidratos refinados, y deben evitarse.  Hable con su nutricionista para identificar sus objetivos diarios en cuanto a los nutrientes mencionados en la etiqueta. Al ir de compras  Evite comprar alimentos procesados, enlatados o precocidos. Estos alimentos tienden a Special educational needs teacher mayor cantidad de [place], sodio y azcar agregada.  Compre en la zona exterior de la tienda de comestibles. Esta es la zona donde se encuentran con mayor frecuencia las frutas y las verduras frescas, los cereales a granel, las carnes frescas y los  productos lcteos frescos. Al cocinar  Utilice mtodos de coccin a baja temperatura, como hornear, en lugar de mtodos de coccin a alta temperatura, como frer en abundante aceite.  Cocine con aceites saludables, como el aceite de [place], canola o [place].  Evite cocinar con manteca, crema o carnes con alto contenido de grasa. Planificacin de las comidas  Coma las comidas y los refrigerios regularmente, preferentemente a la misma hora todos [place]. Evite pasar largos perodos de tiempo sin comer.  Consuma alimentos ricos en fibra, como frutas frescas, verduras, frijoles y cereales integrales. Consulte a su nutricionista sobre cuntas porciones de carbohidratos puede consumir en cada comida.  Consuma entre 4 y 6 onzas (entre 112 y 168g) de protenas magras por da, como carnes magras, pollo, pescado, huevos o tofu. Una onza (oz) de protena magra equivale a: ? 1 onza (28g) de carne, pollo o pescado. ? 1huevo. ?  de taza (62 g) de tofu.  Coma algunos alimentos por da que contengan grasas saludables, como aguacates, frutos secos, semillas y pescado.   Qu alimentos debo comer? Lambert Mody Bayas. Manzanas. Naranjas. Duraznos. Damascos. Ciruelas. Uvas. Mango. Papaya. [place]. Kiwi. Cerezas. [place] Valeda Malm. Espinaca. Verduras de [employer], que incluyen col rizada, [place], hojas de [place] y de [place]. Remolachas. Coliflor. Repollo. Brcoli. Zanahorias. Judas verdes. Tomates. Pimientos. Cebollas. Pepinos. Coles de Bruselas. Julieta Bellini  integrales, como panes, galletas, tortillas, cereales y pastas de salvado o integrales. Avena sin azcar. Quinua. Arroz integral o salvaje. Carnes y Psychiatric nurse. Carne de ave sin piel. Cortes magros de ave y carne de res. Tofu. Frutos secos. Semillas. Lcteos Productos lcteos sin grasa o con bajo contenido de [place], [place], yogur y [place]. Es posible que los productos que se enumeran ms [place] no constituyan una lista completa de los  alimentos y las bebidas que puede tomar. Consulte a un nutricionista para obtener ms informacin. Qu alimentos debo evitar? Lambert Mody Frutas enlatadas al almbar. Verduras Verduras enlatadas. Verduras congeladas con mantequilla o salsa de crema. Granos Productos elaborados con [place] y [place], como panes, pastas, bocadillos y cereales. Evite todos los alimentos procesados. Carnes y otras protenas Cortes de carne con alto contenido de Lobbyist. Carne de ave con piel. Carnes empanizadas o fritas. Carne procesada. Evite las grasas saturadas. Lcteos Yogur, [place] enteros. Bebidas Bebidas azucaradas, como gaseosas o t helado. Es posible que los productos que se enumeran ms [place] no constituyan una lista completa de los alimentos y las bebidas que Nurse, adult. Consulte a un nutricionista para obtener ms informacin. Preguntas para hacerle al mdico  Es necesario que me rena con Radio broadcast assistant en el cuidado de la diabetes?  Es necesario que me rena con un nutricionista?  A qu nmero puedo llamar si tengo preguntas?  Cules son los mejores momentos para controlar la glucemia? Dnde encontrar ms informacin:  Asociacin Estadounidense de la Diabetes (American Diabetes Association): diabetes.org  Academy of Nutrition and Dietetics (Academia de Nutricin y Information systems manager): www.eatright.CSX Corporation of Diabetes and Digestive and Kidney Diseases (Anna la Diabetes y [place] y [place]): DesMoinesFuneral.dk  Association of Diabetes Care and Education Specialists (Asociacin de Especialistas en Atencin y Educacin sobre la Diabetes): www.diabeteseducator.org Resumen  Es importante tener hbitos alimenticios saludables debido a que sus niveles de Designer, television/film set sangre (glucosa) se ven afectados en gran medida por lo que come y bebe.  Un plan de alimentacin saludable lo ayudar a controlar la glucemia y Theatre manager un estilo de  vida saludable.  El mdico puede recomendarle que trabaje con un nutricionista para elaborar el mejor plan para usted.  Tenga en cuenta que los carbohidratos (hidratos de carbono) y el alcohol tienen efectos inmediatos en sus niveles de glucemia. Es importante contar los carbohidratos que ingiere y consumir alcohol con prudencia. Esta informacin no tiene Marine scientist el consejo del mdico. Asegrese de hacerle al mdico cualquier pregunta que tenga. Document Revised: 11/21/2019 Document Reviewed: 11/21/2019 Elsevier Patient Education  2021 [employer].

## 2021-01-09 NOTE — Assessment & Plan Note (Signed)
-   normotensive today, continue home lisinopril

## 2021-01-09 NOTE — Assessment & Plan Note (Signed)
-   increased Trulicity to 3mg  weekly, she will use two pens as she recently got a supply of 1.5mg  autoinjectors, discussed side effects including nausea/diarrhea and signs/symptoms of pancreatitis to watch for - discuss to check fasting BG several times per week and bring to next appointment so we can see how her sugars respond - continue metformin, discontinue glipizide as she is having symptomatic hypoglycemia with this - foot exam wnl today - given COVID booster today, given number for Health Department to call to schedule flu shot

## 2021-01-09 NOTE — Assessment & Plan Note (Signed)
-   still noting muscle aches on the atorvastatin, which she was switched to from pravastatin, plan to discontinue at this time and see if symptoms improve, she will call office in a week and if muscle aches improved we will consider other medications for hyperlipidemia

## 2021-02-04 NOTE — Progress Notes (Signed)
SUBJECTIVE:   CHIEF COMPLAINT / HPI:   Entire visit was completed with Spanish speaking interpreter Marianna Fuss ID number [phone].  T2DM-  Increased to trulicity 1m last time. Having worsening nausea, no severe abd pain or radiating to her back. Would like to decrease the dose to what she was tolerating at 1.5541mweekly.  She does not like she has had a decreased appetite, she just feels like she is intentionally eating less and trying to watch her weight.  We discussed that she lost 8 pounds from last visit.  HLD- was having muscle aches, switched to atorvastatin, aches continued but are better off of the atorvastatin.   The 10-year ASCVD risk score (GMikey BussingC JrBrooke Bonito et al., 2013) is: 4.5%   Values used to calculate the score:     Age: [age]     Sex: Female     Is Non-Hispanic African American: No     Diabetic: Yes     Tobacco smoker: No     Systolic Blood Pressure: 11774mHg     Is BP treated: Yes     HDL Cholesterol: 38 mg/dL     Total Cholesterol: 184 mg/dL  Muscles aches-she notes that starting in August 2021 she started having shoulder, hips, and upper thigh muscular aches.  They are worse first thing in the morning and she notes she is very stiff when she gets up, but notes that she continues to move around that the symptoms improve.  Denies joint pain in shoulder, hips, or back pain. Sometimes the muscle aches will wake her up from sleep.  Denies headaches, vision changes, jaw claudication. Takes OTC calcium/protease supplement that she brought with her today, discussed stopping that for now. Denies only OTC supplements. No trauma or fracture history when all this started.  PERTINENT  PMH / PSH: HTN, T2DM, HLD  OBJECTIVE:   BP 110/60   Pulse 72   Ht 5' 5"  (1.651 m)   Wt 216 lb 12.8 oz (98.3 kg)   LMP 11/04/2020   SpO2 97%   BMI 36.08 kg/m   General: A&O, NAD HEENT: No sign of trauma, EOM grossly intact, no scalp or forehead tenderness to palpation Respiratory: normal  WOB GI: non-distended  Extremities: no peripheral edema. Neuro: Normal gait, moves all four extremities appropriately, strength 5/5 in bilateral upper and lower extremities, no proximal muscle weakness appreciated, mild TTP of bilateral trapezius muscles Skin: no lesions/rashes visualized Psych: Appropriate mood and affect    ASSESSMENT/PLAN:   Myalgia, unspecified site - slightly improved but persistant off of statins, will check CPK today - with nature of worse in morning and improving with activity, more suspicious for rheumatologic cause such as PMR, will get ESR/CRP to assess for inflammation, if elevated consider starting low dose steroids although this would complicate her poorly controlled DM - no joint pain and she is really describing muscle aches not arthritic type pain  Diabetes mellitus type 2 in obese ([place]- did not tolerate increased dose of Trulicity due to GI side effects, will decrease back to 1.41m66meekly and continue metformin 1000 mg BID - have sent message to our pharmacy team to meet with patient to discuss future diabetes control- I discussed possibility of starting insulin with her today if we cannot get covered an SGLT2 which I think she would be a good candidate for - given [facility] Financial Assistance packet to fill out today to help with cost of visits/medications  Secondary diabetes mellitus with hypercholesterolemia (  [place]) - intolerant to both pravastatin and atorvastatin, work-up as above for possible rheumatologic cause of muscle aches - will consider trying once weekly rosuvastatin 68m to decrease potential myalgia side effects pending work-up as above     MLenoria Chime MGeorgetown

## 2021-02-05 ENCOUNTER — Other Ambulatory Visit: Payer: Self-pay

## 2021-02-05 ENCOUNTER — Ambulatory Visit (INDEPENDENT_AMBULATORY_CARE_PROVIDER_SITE_OTHER): Payer: Self-pay | Admitting: Family Medicine

## 2021-02-05 VITALS — BP 110/60 | HR 72 | Ht 65.0 in | Wt 216.8 lb

## 2021-02-05 DIAGNOSIS — M791 Myalgia, unspecified site: Secondary | ICD-10-CM | POA: Insufficient documentation

## 2021-02-05 DIAGNOSIS — E1369 Other specified diabetes mellitus with other specified complication: Secondary | ICD-10-CM

## 2021-02-05 DIAGNOSIS — E669 Obesity, unspecified: Secondary | ICD-10-CM

## 2021-02-05 DIAGNOSIS — E7800 Pure hypercholesterolemia, unspecified: Secondary | ICD-10-CM

## 2021-02-05 DIAGNOSIS — E1169 Type 2 diabetes mellitus with other specified complication: Secondary | ICD-10-CM

## 2021-02-05 NOTE — Assessment & Plan Note (Signed)
-  slightly improved but persistant off of statins, will check CPK today - with nature of worse in morning and improving with activity, more suspicious for rheumatologic cause such as PMR, will get ESR/CRP to assess for inflammation, if elevated consider starting low dose steroids although this would complicate her poorly controlled DM - no joint pain and she is really describing muscle aches not arthritic type pain

## 2021-02-05 NOTE — Patient Instructions (Addendum)
It was wonderful to see you today.  Please bring ALL of your medications with you to every visit.   Today we talked about:  - Fill out [employer] assistance packet - We will go back to Trulicity 1.5mg  weekly and metformin 1000mg  twice daily - Lets hold on starting a new cholesterol medication today and checking labs today for look for rheumatologic causes of your muscle aches and morning stiffness - F/u in 1 month with me, and pharmacy will call you in next 1-2 weeks to make f/u appt for your diabetes   Thank you for choosing [facility].   Please call [phone] with any questions about today's appointment.  Please be sure to schedule follow up at the front  desk before you leave today.   Fue maravilloso verte hoy.  Por favor traiga TODOS sus medicamentos a cada visita.  Hoy hablamos de:  - Complete el paquete de asistencia financiera de [facility] - Volveremos a Trulicity 1,5 mg semanales y metformina 1000 mg dos veces al da - Sigamos comenzando un nuevo medicamento para el colesterol hoy y revisando los laboratorios hoy para buscar causas reumatolgicas de sus dolores musculares y Media planner. - F/u en 1 mes conmigo, y la farmacia lo llamar en las prximas 1-2 semanas para programar f/u cita para su diabetes   Gracias por elegir Medicina familiar [place].  Llame al [phone] si tiene alguna pregunta sobre la cita de [place].  Asegrese de programar un seguimiento en la recepcin antes de irse hoy.   Yehuda Savannah, MD  Family Medicine

## 2021-02-05 NOTE — Assessment & Plan Note (Signed)
-   intolerant to both pravastatin and atorvastatin, work-up as above for possible rheumatologic cause of muscle aches - will consider trying once weekly rosuvastatin 10mg  to decrease potential myalgia side effects pending work-up as above

## 2021-02-05 NOTE — Assessment & Plan Note (Signed)
-   did not tolerate increased dose of Trulicity due to GI side effects, will decrease back to 1.5mg  weekly and continue metformin 1000 mg BID - have sent message to our pharmacy team to meet with patient to discuss future diabetes control- I discussed possibility of starting insulin with her today if we cannot get covered an SGLT2 which I think she would be a good candidate for - given [facility] Financial Assistance packet to fill out today to help with cost of visits/medications

## 2021-02-06 LAB — CK: Total CK: 48 U/L (ref 32–182)

## 2021-02-06 LAB — SEDIMENTATION RATE: Sed Rate: 10 mm/hr (ref 0–40)

## 2021-02-06 LAB — C-REACTIVE PROTEIN: CRP: 1 mg/L (ref 0–10)

## 2021-02-24 ENCOUNTER — Telehealth: Payer: Self-pay | Admitting: Pharmacist

## 2021-02-24 NOTE — Telephone Encounter (Signed)
Attempted to contact patient x2 to schedule appt per Dr. Thompson Grayer.   Utilized interpreting services and left voicemail.

## 2021-03-23 NOTE — Progress Notes (Signed)
    SUBJECTIVE:   CHIEF COMPLAINT / HPI:   [id number]- taking Trulicity and metformin. Open to adding another medication. Denies polyuria and polydipsia. When she checks her BG at home it is usually 160-180s fasting, she only usu checks while fasting.  HLD- wanting to try another medication, she has been intolerant to statins two different ones due to myalgias.  Anemia- noted to have Hgb 9.6 with MCV 73 on chart review 6 months ago, she notes she has been told she has anemia in the past when she was pregnant and with her menses. Notes a history of normal menses, but in the past year they have gotten heavier. Her last period was in Jan 2022 and then she hadn't had one until 6 days ago started again, in the past year her periods have been heavier and she has passed some clots. She has never had a colonoscopy, denies blood in her stools or dark black stools. Sometimes she feels a little lightheaded during her menses, denies lightheadedness today, syncope or pre-syncope, chest pain, or dyspnea. She notes she has taken OCPs in the past to prevent pregnancy but has never taken to help with periods being heavy because they have only been heavier in the past year.  OBJECTIVE:   BP 130/82   Pulse 71   Ht 5\' 5"  (1.651 m)   Wt 216 lb (98 kg)   LMP 03/20/2021 (Exact Date)   SpO2 97%   BMI 35.94 kg/m   General: alert & oriented, no apparent distress, well groomed HEENT: normocephalic, atraumatic, EOM grossly intact, oral mucosa moist, neck supple, conjunctiva with mild pallor Respiratory: normal respiratory effort GI: non-distended Skin: no rashes, no jaundice Psych: appropriate mood and affect    ASSESSMENT/PLAN:   Microcytic anemia - will recheck CBC and iron studies today, pt asymptomatic - discussed this could be due to her heavier menses, but also due to age cannot rule out GI source and need for colonoscopy, will refer to CCM to help with her patient assistance paperwork as she is currently  uninsured - discussed based on her bleeding pattern she is likely perimenopausal, if develops AUB she may need a endometrial biopsy in the future, also discussed based on her degree of anemia we could discuss treatment options to lighten menses if remaining heavy and contributing to anemia  Diabetes mellitus type 2 in obese ([place]) - started Jardiance 10mg  today, covered for  $10 through [employer] and [place], introduced patient to [employer] at end of visit to help with patient assistance paperwork - BMP today, scheduled f/u with pharmacy in 3 weeks to repeat BMP and assess tolerability - discussed side effects of dehydration, vaginal yeast infections and urinary tract infections, discussed staying hydrated, discussed signs/symptoms of euglycemic DKA as a potential side effect and reasons to stop medication and ED precautions - continue trulicity and metformin at this time  Secondary diabetes mellitus with hypercholesterolemia ([place]) - starting Zetia 10mg  daily, also sent to [employer] and [place] for low cost for patient     Lenoria Chime, MD [place]

## 2021-03-25 ENCOUNTER — Encounter: Payer: Self-pay | Admitting: Family Medicine

## 2021-03-25 ENCOUNTER — Other Ambulatory Visit: Payer: Self-pay

## 2021-03-25 ENCOUNTER — Ambulatory Visit (INDEPENDENT_AMBULATORY_CARE_PROVIDER_SITE_OTHER): Payer: Self-pay | Admitting: Family Medicine

## 2021-03-25 VITALS — BP 130/82 | HR 71 | Ht 65.0 in | Wt 216.0 lb

## 2021-03-25 DIAGNOSIS — E1369 Other specified diabetes mellitus with other specified complication: Secondary | ICD-10-CM

## 2021-03-25 DIAGNOSIS — E7800 Pure hypercholesterolemia, unspecified: Secondary | ICD-10-CM

## 2021-03-25 DIAGNOSIS — E669 Obesity, unspecified: Secondary | ICD-10-CM

## 2021-03-25 DIAGNOSIS — E785 Hyperlipidemia, unspecified: Secondary | ICD-10-CM

## 2021-03-25 DIAGNOSIS — E1169 Type 2 diabetes mellitus with other specified complication: Secondary | ICD-10-CM

## 2021-03-25 DIAGNOSIS — D509 Iron deficiency anemia, unspecified: Secondary | ICD-10-CM

## 2021-03-25 MED ORDER — EMPAGLIFLOZIN 10 MG PO TABS
10.0000 mg | ORAL_TABLET | Freq: Every day | ORAL | 3 refills | Status: DC
  Filled 2021-03-25: qty 30, 30d supply, fill #0

## 2021-03-25 MED ORDER — EZETIMIBE 10 MG PO TABS
10.0000 mg | ORAL_TABLET | Freq: Every day | ORAL | 2 refills | Status: DC
  Filled 2021-03-25: qty 30, 30d supply, fill #0
  Filled 2021-05-25: qty 30, 30d supply, fill #1
  Filled 2021-12-28: qty 30, 30d supply, fill #0

## 2021-03-25 NOTE — Patient Instructions (Addendum)
It was wonderful to see you today.  Please bring ALL of your medications with you to every visit.   Today we talked about:  - We will get you set up with patient assistance so you can get a colonoscopy - Checking lab work for your anemia and rechecking your A1c - We are starting Jardiance 10mg  daily, [facility] located at [facility], [place], [place] [address] and Zetia 10mg  daily for your cholesterol - You may have some increased urination, if you feel you are getting lightheaded, nauseous, vomiting, dizziness, pass out, chest pain, dyspnea please give our office a call or go to ED this could be a side effect of the new medication - Scheduled a follow up in 3 weeks with pharmacy to recheck your lab work and assess your new medications   Thank you for choosing [facility].   Please call [phone] with any questions about today's appointment.  Please be sure to schedule follow up at the front  desk before you leave today.   Meredith Savannah, MD  Family Medicine    Fue maravilloso verte hoy.  Por favor traiga TODOS sus medicamentos a cada visita.  Hoy hablamos de:  - Lo prepararemos con la asistencia del paciente para que pueda obtener una colonoscopia. - Verificar el trabajo de laboratorio para su anemia y Location manager a verificar su A1c - Estamos comenzando con Jardiance 10 mg diarios, esto se envi a la farmacia comunitaria de salud y [place] en [place], [place], [place] [address] y Zetia 10 mg diarios para su colesterol - Es posible que tenga un aumento en la miccin, si siente que se [place], tiene nuseas, vmitos, mareos, [employer], [employer], [place], llame a nuestra oficina o vaya al servicio de urgencias, esto podra ser un efecto secundario del [place]. - Program un seguimiento en 3 semanas con la farmacia para volver a verificar su trabajo de laboratorio y evaluar sus nuevos  medicamentos   Gracias por elegir Medicina familiar [place].  Llame al [phone] si tiene alguna pregunta sobre la cita de [place].  Asegrese de programar un seguimiento en la recepcin antes de irse hoy.  Meredith Savannah, MD

## 2021-03-26 LAB — HEMOGLOBIN A1C
Est. average glucose Bld gHb Est-mCnc: 217 mg/dL
Hgb A1c MFr Bld: 9.2 % — ABNORMAL HIGH (ref 4.8–5.6)

## 2021-03-26 LAB — BASIC METABOLIC PANEL
BUN/Creatinine Ratio: 20 (ref 9–23)
BUN: 10 mg/dL (ref 6–24)
CO2: 24 mmol/L (ref 20–29)
Calcium: 9.1 mg/dL (ref 8.7–10.2)
Chloride: 103 mmol/L (ref 96–106)
Creatinine, Ser: 0.49 mg/dL — ABNORMAL LOW (ref 0.57–1.00)
Glucose: 136 mg/dL — ABNORMAL HIGH (ref 65–99)
Potassium: 4 mmol/L (ref 3.5–5.2)
Sodium: 141 mmol/L (ref 134–144)
eGFR: 112 mL/min/{1.73_m2} (ref >59)

## 2021-03-26 LAB — CBC
Hematocrit: 32.8 % — ABNORMAL LOW (ref 34.0–46.6)
Hemoglobin: 10.7 g/dL — ABNORMAL LOW (ref 11.1–15.9)
MCH: 27.4 pg (ref 26.6–33.0)
MCHC: 32.6 g/dL (ref 31.5–35.7)
MCV: 84 fL (ref 79–97)
Platelets: 159 10*3/uL (ref 150–450)
RBC: 3.91 x10E6/uL (ref 3.77–5.28)
RDW: 15.6 % — ABNORMAL HIGH (ref 11.7–15.4)
WBC: 6.3 10*3/uL (ref 3.4–10.8)

## 2021-03-26 LAB — IRON,TIBC AND FERRITIN PANEL
Ferritin: 4 ng/mL — ABNORMAL LOW (ref 15–150)
Iron Saturation: 7 % — CL (ref 15–55)
Iron: 26 ug/dL — ABNORMAL LOW (ref 27–159)
Total Iron Binding Capacity: 365 ug/dL (ref 250–450)
UIBC: 339 ug/dL (ref 131–425)

## 2021-03-26 NOTE — Assessment & Plan Note (Signed)
-   started Jardiance 10mg  today, covered for  $10 through [employer] and [place], introduced patient to [employer] at end of visit to help with patient assistance paperwork - BMP today, scheduled f/u with pharmacy in 3 weeks to repeat BMP and assess tolerability - discussed side effects of dehydration, vaginal yeast infections and urinary tract infections, discussed staying hydrated, discussed signs/symptoms of euglycemic DKA as a potential side effect and reasons to stop medication and ED precautions - continue trulicity and metformin at this time

## 2021-03-26 NOTE — Assessment & Plan Note (Signed)
-   will recheck CBC and iron studies today, pt asymptomatic - discussed this could be due to her heavier menses, but also due to age cannot rule out GI source and need for colonoscopy, will refer to CCM to help with her patient assistance paperwork as she is currently uninsured - discussed based on her bleeding pattern she is likely perimenopausal, if develops AUB she may need a endometrial biopsy in the future, also discussed based on her degree of anemia we could discuss treatment options to lighten menses if remaining heavy and contributing to anemia

## 2021-03-26 NOTE — Assessment & Plan Note (Signed)
-   starting Zetia 10mg  daily, also sent to [facility] for low cost for patient

## 2021-03-30 ENCOUNTER — Telehealth: Payer: Self-pay | Admitting: Family Medicine

## 2021-03-30 ENCOUNTER — Other Ambulatory Visit: Payer: Self-pay

## 2021-03-30 DIAGNOSIS — D509 Iron deficiency anemia, unspecified: Secondary | ICD-10-CM

## 2021-03-30 MED ORDER — FERROUS SULFATE 325 (65 FE) MG PO TABS
325.0000 mg | ORAL_TABLET | ORAL | 2 refills | Status: DC
  Filled 2021-03-30: qty 15, 30d supply, fill #0

## 2021-03-30 NOTE — Telephone Encounter (Signed)
Called with Spanish speaking interpretor ID: [phone].   Discussed lab work showing iron deficiency anemia, Hgb 10.7. Discussed sending in iron tab every other day, she notes she has some at home that she has not been taking. Discussed she could restart ferrous sulfate 325mg  every other day, I sent a new script into her pharmacy. She has stopped having her menses and has no further vaginal bleeding, discussed I suspect she is perimenopausal but if she has further bleeding in between periods, or heavy bleeding, or spotting, needs to make an appointment as this would be abnormal and require further evaluation. Discussed that I have sent a referral to CCM to reach out to her to help with patient assistance program as she still needs a colonoscopy. I reiterated that it is important with her iron deficiency anemia and age for screening for colon polyps and risk of colon cancer. She is aware and awaits the call.   For her diabetes, her A1c was 9.2. She has not yet picked up the Zetia or Jardiance but will go get them tomorrow. Discussed that I still feel like she needs these and reminded her of importance and her follow up appointment with our pharmacy on 04/14/2021.  Yehuda Savannah MD

## 2021-03-31 ENCOUNTER — Other Ambulatory Visit: Payer: Self-pay

## 2021-04-01 NOTE — Progress Notes (Signed)
Submitted application for JARDIANCE to [employer] ([employer]) for patient assistance.   Phone: [phone]  

## 2021-04-14 ENCOUNTER — Ambulatory Visit: Payer: Self-pay | Admitting: Pharmacist

## 2021-04-16 NOTE — Progress Notes (Signed)
Received notification from [employer]) regarding approval for [employer]. Patient assistance approved from 04/14/21 to 04/14/22.  Medication will ship 3 month supply to pt's home.  Phone: [phone]

## 2021-04-20 ENCOUNTER — Telehealth: Payer: Self-pay

## 2021-04-20 NOTE — Telephone Encounter (Signed)
Received VM from patient regarding request for Trulicity samples. Please advise if patient can receive samples or if patient can be enrolled in medication assistance program for medication.   Talbot Grumbling, RN

## 2021-04-21 ENCOUNTER — Encounter: Payer: Self-pay | Admitting: Family Medicine

## 2021-04-21 ENCOUNTER — Ambulatory Visit (INDEPENDENT_AMBULATORY_CARE_PROVIDER_SITE_OTHER): Payer: Self-pay | Admitting: Family Medicine

## 2021-04-21 ENCOUNTER — Ambulatory Visit (HOSPITAL_COMMUNITY)
Admission: RE | Admit: 2021-04-21 | Discharge: 2021-04-21 | Disposition: A | Discharge status: Home / Self Care | Payer: Self-pay | Source: Ambulatory Visit | Attending: Family Medicine | Admitting: Family Medicine

## 2021-04-21 ENCOUNTER — Other Ambulatory Visit: Payer: Self-pay

## 2021-04-21 VITALS — BP 120/70 | HR 74 | Ht 65.0 in | Wt 214.0 lb

## 2021-04-21 DIAGNOSIS — R079 Chest pain, unspecified: Secondary | ICD-10-CM | POA: Insufficient documentation

## 2021-04-21 DIAGNOSIS — R0789 Other chest pain: Secondary | ICD-10-CM | POA: Insufficient documentation

## 2021-04-21 DIAGNOSIS — Z6835 Body mass index (BMI) 35.0-35.9, adult: Secondary | ICD-10-CM | POA: Insufficient documentation

## 2021-04-21 DIAGNOSIS — I152 Hypertension secondary to endocrine disorders: Secondary | ICD-10-CM

## 2021-04-21 DIAGNOSIS — I10 Essential (primary) hypertension: Secondary | ICD-10-CM | POA: Insufficient documentation

## 2021-04-21 DIAGNOSIS — E1159 Type 2 diabetes mellitus with other circulatory complications: Secondary | ICD-10-CM

## 2021-04-21 DIAGNOSIS — E1169 Type 2 diabetes mellitus with other specified complication: Secondary | ICD-10-CM | POA: Insufficient documentation

## 2021-04-21 NOTE — Patient Instructions (Addendum)
Gracias por venir hoy. En este momento su corazn no muestra ningn signo de dao y su examen neurolgico fue normal. Creo que repetir la prueba de estrs en un futuro prximo sera Meredith Maynard buena idea. Llmenos si sus sntomas reaparecen o si decide que desea una derivacin a cardiologa. Asegrese de hacer un seguimiento con su mdico de familia en julio segn lo programado. Contine con su medicamento para la presin arterial segn lo prescrito. Puede tomar la siguiente dosis maana a la hora habitual.    Thank you for coming in today.  At this time your heart does not show any signs of damage and your neurological exam was normal.  I do think that a repeat stress test in the near future would be a good idea.  Please give [place] a call if your symptoms recur or if you decide you would like a referral to cardiology Please be sure to follow up with your family doctor in July as scheduled. Continue your blood pressure medicine as prescribed. You can take the next dose tomorrow at your normal time.

## 2021-04-21 NOTE — Assessment & Plan Note (Addendum)
Acute. Episode of head pressure with mild chest pressure and SOB that resolved over period a few hours. Blood pressure at time of event was only mildly elevated. EKG today without signs of cardiac damage. Neuro exam normal. No symptoms with exertion. Per chart review, normal stress test in 2008. No current pain. Does have history of panic attacks that she notes does present similarly. Has had more stress lately. Unclear etiology. I do feel reassured that EKG and neuro exam are normal today. Panic attack does appear most likely etiology however offered cardiology referral for stress test but patient opted to think it over and discuss with PCP at her follow up in 1 month. Discussed return precautions. She voiced understanding and agreement with plan.

## 2021-04-21 NOTE — Progress Notes (Signed)
Subjective:   Patient ID: [place]    DOB: Jul 17, 1966, 54 y.o. female   MRN: [id number]  Meredith Maynard is a 55 y.o. female with a history of hypertension, GERD, type 2 diabetes, BPPV, osteoarthritis, back pain, constipation, microcytic anemia, morbid obesity, myalgia here for blood pressure check.  HTN:  BP: 120/70 today. Currently on Lisinopril 20mg  QD. Endorses compliance. Non-smoker. Patient notes that she went to the gym around 9-10 40 5 PM last night and did a walk on the treadmill for at least 20 minutes.  She was at home on the phone with her sister commonly having a conversation when she developed pressure in the back of her head that started to radiate towards the front.  She denies any pain but feeling like "a balloon was inflating and deflating".  She all of a sudden developed whole body "weakness" and describes it as feeling like she had no tone.  She also developed mild chest pressure and mild shortness of breath.  She placed her feet up onto a chair and sat still. She took her blood pressure several times which was noted to be 139/95 and 140/95. She took an additional lisinopril that night and 80mg  ASA. She notes the pain slowly went away.  She denies any current pain.  Denies any pain with her exercise routine.  She does note that she has been a lot more stressed lately and has a history of panic attacks that did present similarly to this other than the headache.  Lab Results  Component Value Date   CREATININE 0.49 (L) 03/25/2021   CREATININE 0.52 (L) 12/30/2020   CREATININE 0.56 (L) 06/24/2020    Review of Systems:  Per HPI.   Objective:   BP 120/70   Pulse 74   Ht 5\' 5"  (1.651 m)   Wt 214 lb (97.1 kg)   LMP 02/22/2021   SpO2 97%   BMI 35.61 kg/m  Vitals and nursing note reviewed.  General: pleasant young female, sitting comfortably in exam chair, well nourished, well developed, in no acute distress with non-toxic appearance CV:  regular rate and rhythm without murmurs, rubs, or gallops, 2+ radial and pedal pulses bilaterally Lungs: clear to auscultation bilaterally with normal work of breathing on room air, speaking in full sentences Skin: warm, dry Extremities: warm and well perfused, normal tone MSK:  gait normal Neuro: Alert and oriented, speech normal, CN grossly intact, 5/5 strength in UE and LE, PERRL, EOMI  EKG: NSR, HR 76, No ST changes  Assessment & Plan:   Chest pressure Acute. Episode of head pressure with mild chest pressure and SOB that resolved over period a few hours. Blood pressure at time of event was only mildly elevated. EKG today without signs of cardiac damage. Neuro exam normal. No symptoms with exertion. Per chart review, normal stress test in 2008. No current pain. Does have history of panic attacks that she notes does present similarly. Has had more stress lately. Unclear etiology. I do feel reassured that EKG and neuro exam are normal today. Panic attack does appear most likely etiology however offered cardiology referral for stress test but patient opted to think it over and discuss with PCP at her follow up in 1 month. Discussed return precautions. She voiced understanding and agreement with plan.  Hypertension associated with diabetes ([place]) Chronic, well controlled. Only mildly elevated on home readings. Recommended continuing current Lisinopril 20mg  QD Follow up with PCP as scheduled.  Orders Placed This Encounter  Procedures  EKG 12-Lead    No orders of the defined types were placed in this encounter.     Mina Marble, DO PGY-3, [facility] 04/21/2021 5:54 PM

## 2021-04-21 NOTE — Assessment & Plan Note (Signed)
Chronic, well controlled. Only mildly elevated on home readings. Recommended continuing current Lisinopril 20mg  QD Follow up with PCP as scheduled.

## 2021-04-22 NOTE — Progress Notes (Signed)
Submitted application for TRULICITY 1.5MG /0.5ML to [place] for patient assistance.   Phone: [phone]

## 2021-04-29 ENCOUNTER — Telehealth: Payer: Self-pay | Admitting: *Deleted

## 2021-04-29 NOTE — Telephone Encounter (Signed)
   Telephone encounter was:  Unsuccessful.  04/29/2021 Name: Sandhya Denherder MRN: [id number] DOB: 10/19/1966  Unsuccessful outbound call made today to assist with:   orange card   Outreach Attempt:  2nd Attempt  No answer  [place], Care Management  [phone] [address] , [place] [address] Email : [email or web address]

## 2021-04-30 ENCOUNTER — Telehealth: Payer: Self-pay

## 2021-04-30 NOTE — Telephone Encounter (Signed)
Pt's medication shipped to clinic from [facility]. Medication is ready in med room fridge (4 boxes). Pt is aware it is ready for pickup.

## 2021-04-30 NOTE — Telephone Encounter (Signed)
Medication provided to the patient.

## 2021-05-04 ENCOUNTER — Other Ambulatory Visit: Payer: Self-pay

## 2021-05-04 ENCOUNTER — Ambulatory Visit (INDEPENDENT_AMBULATORY_CARE_PROVIDER_SITE_OTHER): Payer: Self-pay | Admitting: Pharmacist

## 2021-05-04 DIAGNOSIS — E1369 Other specified diabetes mellitus with other specified complication: Secondary | ICD-10-CM

## 2021-05-04 DIAGNOSIS — I152 Hypertension secondary to endocrine disorders: Secondary | ICD-10-CM

## 2021-05-04 DIAGNOSIS — E1169 Type 2 diabetes mellitus with other specified complication: Secondary | ICD-10-CM

## 2021-05-04 DIAGNOSIS — E7800 Pure hypercholesterolemia, unspecified: Secondary | ICD-10-CM

## 2021-05-04 DIAGNOSIS — E669 Obesity, unspecified: Secondary | ICD-10-CM

## 2021-05-04 DIAGNOSIS — E1159 Type 2 diabetes mellitus with other circulatory complications: Secondary | ICD-10-CM

## 2021-05-04 MED ORDER — TRULICITY 3 MG/0.5ML ~~LOC~~ SOAJ: 3.0000 mg | SUBCUTANEOUS | Status: DC

## 2021-05-04 NOTE — Patient Instructions (Addendum)
Meredith Maynard fue un placer verla hoy.  Por favor haz lo siguiente:  1. Aumente Trulicity a 3 mg una vez al [employer] se indica hoy durante su cita. Si tiene alguna pregunta o si cree que ha ocurrido algo debido a este cambio, llmeme a m oa su mdico para informarnos. Tomar dos tomas de Trulicity una vez a la semana el domingo. 2. Contine controlando los niveles de [employer] optician en casa. Es muy importante que los registre y los lleve a su prxima cita con el mdico. 3. Contine haciendo los cambios de estilo de vida que hemos discutido juntos durante nuestra visita. La dieta y el ejercicio juegan un papel importante en la mejora de los niveles de azcar en la [place]. 4. Seguimiento conmigo en un mes.   Hipoglucemia o nivel bajo de azcar en la sangre:  El nivel bajo de azcar en la sangre puede ocurrir rpidamente y convertirse en una emergencia si no se trata de inmediato.  Si bien esto no debera suceder con frecuencia, puede ocurrir si se salta una comida o no come lo suficiente. Adems, si su insulina u otros medicamentos para la diabetes tienen una dosis demasiado alta, esto puede hacer que su nivel de azcar en la sangre baje.  Las seales de advertencia de niveles bajos de azcar en la sangre incluyen: 1. Sentirse tembloroso o mareado 2. Sentirse dbil o cansado 3. Hambre excesiva 4. Sentirse ansioso o molesto 5. Sudar incluso cuando no haces ejercicio  Qu hacer si tengo un nivel bajo de azcar en la sangre? Sigue la regla del 15 1. Controle su nivel de azcar en la sangre con su medidor. Si es inferior a 70, contine con el paso 2. 2. Trate con 15 gramos de carbohidratos de accin rpida que se encuentran en 3-4 tabletas de glucosa. Si no hay ninguno disponible, puede probar con caramelos duros, 1 cucharada de azcar o miel, 4 onzas de jugo de frutas o 6 onzas de refresco NORMAL. 3. Vuelva a controlar su nivel de azcar en 15 minutos. Si todava est por debajo  de 70, vuelva a hacer lo que hizo en el paso 2. Si su nivel de azcar en la sangre ha vuelto a subir, contine y coma un refrigerio o una comida pequea compuesta de carbohidratos complejos (p. ej., granos integrales) y protenas en este momento para evitar la recurrencia del nivel bajo de azcar en la sangre.  Ms. Meredith Maynard it was a pleasure seeing you today.   Please do the following:  Increase Trulicity to 3mg  once daily as directed today during your appointment. If you have any questions or if you believe something has occurred because of this change, call me or your doctor to let one of [place] know. You will take two shots of Trulicity once a week on Sunday. Continue checking blood sugars at home. It's really important that you record these and bring these in to your next doctor's appointment.  Continue making the lifestyle changes we've discussed together during our visit. Diet and exercise play a significant role in improving your blood sugars.  Follow-up with me in one month.    Hypoglycemia or low blood sugar:   Low blood sugar can happen quickly and may become an emergency if not treated right away.   While this shouldn't happen often, it can be brought upon if you skip a meal or do not eat enough. Also, if your insulin or other diabetes medications are dosed too high, this  can cause your blood sugar to go to low.   Warning signs of low blood sugar include: Feeling shaky or dizzy Feeling weak or tired  Excessive hunger Feeling anxious or upset  Sweating even when you aren't exercising  What to do if I experience low blood sugar? Follow the Rule of 15 Check your blood sugar with your meter. If lower than 70, proceed to step 2.  Treat with 15 grams of fast acting carbs which is found in 3-4 glucose tablets. If none are available you can try hard candy, 1 tablespoon of sugar or honey,4 ounces of fruit juice, or 6 ounces of REGULAR soda.  Re-check your sugar in 15 minutes. If it  is still below 70, do what you did in step 2 again. If your blood sugar has come back up, go ahead and eat a snack or small meal made up of complex carbs (ex. Whole grains) and protein at this time to avoid recurrence of low blood sugar.

## 2021-05-04 NOTE — Progress Notes (Signed)
Subjective:    Patient ID: Meredith Maynard, female    DOB: 09/14/1966, 55 y.o.   MRN: [id number]  HPI Patient is a 55 y.o. female who presents for diabetes management. She is in good spirits and presents without assistance. Utilized intepreting services ID [id number]. Patient was referred on 03/26/21 and last seen by provider, Dr. Tarry Kos, on 04/21/21.   Patient expressed a lot of concerns regarding Jardiance. She reports that she took the medication for two weeks and felt her blood glucose and blood pressure readings were increasing. She also states she felt "weird" and short of breath while on the medication. In addition she states it caused her vaginal dryness and that her "private parts were more sensitive and painful." She subsequently stopped the medication after the two week time period and reports that most of her symptoms have resolved. She states she was seen here recently (04/21/21) for the shortness of breath and chest pain but the cause of this was not identified and may have been attributed to her history of panic attacks. Recommendation for referral to cardiology was made at that appt but patient states she would still like to hold off until she sees Dr. Thompson Grayer in a few weeks.  Patient reports diabetes was diagnosed in 2006.   Insurance coverage/medication affordability: self-pay  Current diabetes medications include: Trulicity 1.5mg , metformin 1000mg  BID Current hypertension medications include: lisinopril 20mg  Current hyperlipidemia medications include: ezetimibe 10mg  Patient states that She is taking her medications as prescribed. Patient denies adherence with medications. Patient states that She misses her medications 1-2 times per week, on average.  Do you feel that your medications are working for you?  yes  Have you been experiencing any side effects to the medications prescribed? Yes; jardiance  Do you have any problems obtaining medications due to transportation or  finances?  no    Patient denies hypoglycemic events. Patient reports polyuria (increased urination) when she initiated Jardiance but states this has subsided. Patient denies polyphagia (increased appetite).  Patient denies polydipsia (increased thirst).  Patient denies neuropathy (nerve pain). Patient reports visual changes. Patient reports seeing a black dot in her vision field of her left eye. She states she has not seen an eye doctor for this. Recommended patient be seen for this and will route message to PCP.  Patient denies self foot exams.   Home fasting blood sugars: 195, 197, 150   Home blood pressure readings range from 409-811 systolic and 91-478 diastolic  Objective:   Labs:   Physical Exam Neurological:     Mental Status: She is alert and oriented to person, place, and time.    Review of Systems  Gastrointestinal:  Negative for nausea and vomiting.   Lab Results  Component Value Date   HGBA1C 9.2 (H) 03/25/2021   HGBA1C 9.9 (A) 12/30/2020   HGBA1C 8.1 (A) 06/24/2020    Vitals:   05/04/21 0933  BP: 129/70  Pulse: 84    Lab Results  Component Value Date   MICRALBCREAT <30 09/23/2019    Lipid Panel     Component Value Date/Time   CHOL 184 12/30/2020 1158   TRIG 457 (H) 12/30/2020 1158   HDL 38 (L) 12/30/2020 1158   CHOLHDL 4.8 (H) 12/30/2020 1158   CHOLHDL 3.2 10/14/2016 0835   VLDL 33 (H) 10/14/2016 0835   LDLCALC 74 12/30/2020 1158   LDLDIRECT 108 (H) 03/12/2014 1002    Clinical Atherosclerotic Cardiovascular Disease (ASCVD): No  The 10-year ASCVD risk score ([place]  Brooke Bonito., et al., 2013) is: 6.2%   Values used to calculate the score:     Age: 85 years     Sex: Female     Is Non-Hispanic African American: No     Diabetic: Yes     Tobacco smoker: No     Systolic Blood Pressure: 269 mmHg     Is BP treated: Yes     HDL Cholesterol: 38 mg/dL     Total Cholesterol: 184 mg/dL    Assessment/Plan:   T2DM is not controlled likely due to  suboptimal adherence and patient discontinuing Jardiance after two weeks. Patient previously did not tolerate increased dose of Trulicity from 1.5mg  to 3mg  but willing to re-try due to not tolerating Jardiance. Will likely need to consider basal insulin in the future if patient continues to be hyperglycemic. Following instruction patient verbalized understanding of treatment plan.    Increased dose of GLP-1 dulaglutide (Trulicity) to 3mg  once weekly (Sundays). Discontinued SGLT2-I empagliflozin (Jardiance) 10mg .  Extensively discussed pathophysiology of diabetes, dietary effects on blood sugar control, and recommended lifestyle interventions. Counseled on s/sx of and management of hypoglycemia Next A1C anticipated August 2022.   ASCVD risk - primary prevention in patient with diabetes. Last LDL is controlled. ASCVD risk score is not >20%  Patient with history of intolerance to statin therapy.  Continued ezetimibe 10mg .  Hypertension longstanding currently controlled and has been in clinic despite patient reported increase in home blood pressure readings. This may be due to improper technique, improper cuff size, faulty blood pressure device, or potential outside factors such as anxiety leading to increase in blood pressure.  Recommended patient can bring in blood pressure device to our next visit for comparison to in-office readings.  Follow-up appointment in 4 weeks to review sugar readings. Written patient instructions provided.  This appointment required 75 minutes of patient care (this includes precharting, chart review, review of results, and face-to-face care).  Thank you for involving pharmacy to assist in providing this patient's care.

## 2021-05-04 NOTE — Assessment & Plan Note (Signed)
T2DM is not controlled likely due to suboptimal adherence and patient discontinuing Jardiance after two weeks. Patient previously did not tolerate increased dose of Trulicity from 1.5mg  to 3mg  but willing to re-try due to not tolerating Jardiance. Will likely need to consider basal insulin in the future if patient continues to be hyperglycemic. Following instruction patient verbalized understanding of treatment plan.    1. Increased dose of GLP-1 dulaglutide (Trulicity) to 3mg  once weekly (Sundays). 2. Discontinued SGLT2-I empagliflozin (Jardiance) 10mg .  3. Extensively discussed pathophysiology of diabetes, dietary effects on blood sugar control, and recommended lifestyle interventions. 4. Counseled on s/sx of and management of hypoglycemia 5. Next A1C anticipated August 2022.

## 2021-05-04 NOTE — Assessment & Plan Note (Signed)
ASCVD risk - primary prevention in patient with diabetes. Last LDL is controlled. ASCVD risk score is not >20%  Patient with history of intolerance to statin therapy.  1. Continued ezetimibe 10mg .

## 2021-05-04 NOTE — Assessment & Plan Note (Signed)
Hypertension longstanding currently controlled and has been in clinic despite patient reported increase in home blood pressure readings. This may be due to improper technique, improper cuff size, faulty blood pressure device, or potential outside factors such as anxiety leading to increase in blood pressure.  1. Recommended patient can bring in blood pressure device to our next visit for comparison to in-office readings.

## 2021-05-04 NOTE — Addendum Note (Signed)
Addended by: Hughes Better on: 05/04/2021 11:33 AM   Modules accepted: Orders

## 2021-05-05 ENCOUNTER — Telehealth: Payer: Self-pay | Admitting: *Deleted

## 2021-05-05 NOTE — Telephone Encounter (Signed)
   Telephone encounter was:  Successful.  05/05/2021 Name: Meredith Maynard MRN: [id number] DOB: 1966-09-13  Meredith Maynard is a 55 y.o. year old female who is a primary care patient of Pray, Norwood Levo, MD . The community resource team was consulted for assistance with intrepreter [phone] [name] was able to talk with patient she has application for orange card will reach  out if she needs assisitance   Care guide performed the following interventions: Patient provided with information about care guide support team and interviewed to confirm resource needs Follow up call placed to community resources to determine status of patients referral.  Follow Up Plan:  No further follow up planned at this time. The patient has been provided with needed resources.  [place], Care Management  [phone] [address] , [place] [address] Email : Ashby [email or web address]. [email or web address]

## 2021-05-13 NOTE — Progress Notes (Signed)
Re enrollment approved 04/30/21 - 04/30/22

## 2021-05-21 ENCOUNTER — Ambulatory Visit (INDEPENDENT_AMBULATORY_CARE_PROVIDER_SITE_OTHER): Payer: Self-pay | Admitting: Family Medicine

## 2021-05-21 ENCOUNTER — Other Ambulatory Visit: Payer: Self-pay

## 2021-05-21 ENCOUNTER — Encounter: Payer: Self-pay | Admitting: Family Medicine

## 2021-05-21 DIAGNOSIS — L84 Corns and callosities: Secondary | ICD-10-CM

## 2021-05-21 DIAGNOSIS — N951 Menopausal and female climacteric states: Secondary | ICD-10-CM

## 2021-05-21 DIAGNOSIS — M25562 Pain in left knee: Secondary | ICD-10-CM | POA: Insufficient documentation

## 2021-05-21 DIAGNOSIS — E1169 Type 2 diabetes mellitus with other specified complication: Secondary | ICD-10-CM

## 2021-05-21 DIAGNOSIS — E669 Obesity, unspecified: Secondary | ICD-10-CM

## 2021-05-21 DIAGNOSIS — D509 Iron deficiency anemia, unspecified: Secondary | ICD-10-CM

## 2021-05-21 MED ORDER — FERROUS SULFATE 325 (65 FE) MG PO TABS: 325.0000 mg | ORAL_TABLET | ORAL | 2 refills | Status: DC

## 2021-05-21 MED ORDER — FERROUS SULFATE 325 (65 FE) MG PO TABS
325.0000 mg | ORAL_TABLET | ORAL | 2 refills | Status: DC
  Filled 2021-05-21: qty 30, 60d supply, fill #0

## 2021-05-21 NOTE — Assessment & Plan Note (Addendum)
I suspect she is going through menopause as she had one period in last six months. No bleeding in between and no further occurrences thus not meeting criteria for abnormal uterine bleeding.  The patient's history does not yet indicate further screening. Will continue to monitor the patient's menstrual bleeding and will reassess for further workup in the future.

## 2021-05-21 NOTE — Assessment & Plan Note (Addendum)
The patient reports tolerating Trulicity well and her history and exam show no new diabetic symptoms. She has mild neuropathy shown by her diabetic foot exam, which will be noted for future follow-ups, allowing providers to follow the clinical course of her diabetes. Provided the patient with a list of ophthalmologists for a diabetic eye exam. No further intervention is indicated at this time. The patient will return in a month for her A1c check and evaluation.

## 2021-05-21 NOTE — Assessment & Plan Note (Deleted)
For past few months, getting worse, will be addressed at next visit

## 2021-05-21 NOTE — Assessment & Plan Note (Addendum)
The nonpainful lesions the patient has are consistent with calluses. Briefly discussed her walking shoes and purchasing different footwear, but the patient's finances may limit her purchasing options. At this time, no intervention is needed, though soaking the feet and using pumice stones on the calluses can be of use. Recommended addressing any foot pains or aches OTC NSAIDs or Tylenol.

## 2021-05-21 NOTE — Progress Notes (Signed)
SUBJECTIVE:   CHIEF COMPLAINT / HPI:   Meredith Maynard is a 55 y.o. female presenting to the clinic for evaluation of a foot lesion as well as diabetes and menopause follow-up.  T2DM The patient has started 3 mg of Trulicity 3 weeks ago. She was on 1.5 mg of Trulicity 2 months ago. At the time, she experienced sever LUQ pain and severe nausea and discontinued the Trulicity in favor of Jardiance, which she also experienced side effects with (headaches, blood pressure issues). Currently, the patient is tolerating the 3 mg of Trulicity well. She reports an occasional 2/10 abdominal pain and occasional light nausea, but these do not bother her significantly.  She has not been urinating or drinking more than usual. She does not experience any tingling or numbness in her hands or feet. She is concerned what she thinks are foot ulcers. She is exercising more and limiting her intake of carbohydrates.  Foot lesion The patient identifies a foot "ulcer" on the plantar surface of her left foot, which started bothering her 6-7 months ago. She does not feel any pain from the lesion specifically, but her feet throb sometimes after a day of walking. She has been biking and walking more in the last few months. The patient has not noticed any bleeding or drainage from the lesions.  Menses & perimenopause The patient previously had regular periods, then stopped having periods in Jan 2022. She had another 5 day menses in May, but none since. Her last period was at the end of May. She previously had normal periods. She endorses hot flashes and periods of heavy swelling. She does not have abdominal or pubic pain or bleeding outside of her irregular periods.  Financial aid; [employer] The patient recently got an application to renew her financial aid in the mail and is getting her documents together. She does not have any documents with her today and declined counseling from social work. She will bring  some papers the next time she has an appointment.  Problems for future follow-up -Left hand 3rd digit swelling Occasional, cannot close, then goes away Been swelling less now that she is exercising more -Left knee pain The patient has been experiencing more pain recently given her increased exercise. The Gabapentin she takes for her cervical radiculopathy helps and Advil occasionally helps as well she cannot currently afford PT, but that will change if she can get financial aid.   PERTINENT  PMH / PSH:   Current medications: ezetimibe (ZETIA) 10 mg, Oral, Daily  famotidine (PEPCID) 20 mg, Oral, 2 times daily  ferrous sulfate 325 mg, Oral, Every other day  fluticasone (FLONASE) 50 MCG/ACT nasal spray 2 sprays, Each Nare, Daily  gabapentin (NEURONTIN) 200 mg, Oral, 2 times daily  lisinopril (ZESTRIL) 20 mg, Oral, Daily  meclizine (ANTIVERT) 12.5 mg, Oral, 3 times daily PRN  metFORMIN (GLUCOPHAGE) 1000 MG tablet TAKE 1 TABLET BY MOUTH TWICE DAILY WITH A MEAL  Multiple Vitamins-Minerals (MULTIVITAMIN ADULT PO) 1 tablet, Oral, Daily  Trulicity 3 mg, Subcutaneous, Weekly    The patient is not a smoker and has not smoked. She does not drink alcohol.  OBJECTIVE:   BP 100/62   Pulse 70   Wt 218 lb 12.8 oz (99.2 kg)   SpO2 98%   BMI 36.41 kg/m    General: age-appropriate, resting comfortably in chair, NAD, WNWD, alert and at baseline Neck: Supple. No LAD, thyroid smooth and not palpable. Cardiovascular: Regular rate and rhythm. Normal S1/S2. No  murmurs, rubs, or gallops appreciated. 2+ radial and pedal pulses bilaterally. Pulmonary: Clear bilaterally to ascultation. No increased WOB, no accessory muscle usage. No wheezes, rales, or crackles. Abdominal: Normoactive bowel sounds. No tenderness to deep or light palpation. No rebound or guarding. No HSM. Extremities: No peripheral edema. Diabetic foot exam reveals loss of sensation on plantar surface and tip of 1st, and 3rd phalanges  bilaterally. Sensation intact on dorsal surfaces of feet and toes. 2 calluses present on plantar surface of left foot beneath the metatarsals and 1 callus present on the medial side of the right foot. Calluses are non-fluctuant, non-tender to palpation, and without skin breaks. Psych: Pleasant and appropriate  ASSESSMENT/PLAN:   Diabetes mellitus type 2 in obese [facility]) The patient reports tolerating Trulicity well and her history and exam show no new diabetic symptoms. She has mild neuropathy shown by her diabetic foot exam, which will be noted for future follow-ups, allowing providers to follow the clinical course of her diabetes. Provided the patient with a list of ophthalmologists for a diabetic eye exam. No further intervention is indicated at this time. The patient will return in a month for her A1c check and evaluation.  Callus of foot The nonpainful lesions the patient has are consistent with calluses. Briefly discussed her walking shoes and purchasing different footwear, but the patient's finances may limit her purchasing options. At this time, no intervention is needed, though soaking the feet and using pumice stones on the calluses can be of use. Recommended addressing any foot pains or aches OTC NSAIDs or Tylenol.  Perimenopause I suspect she is going through menopause as she had one period in last six months. No bleeding in between and no further occurrences thus not meeting criteria for abnormal uterine bleeding.  The patient's history does not yet indicate further screening. Will continue to monitor the patient's menstrual bleeding and will reassess for further workup in the future.   Preventative Recommended finishing Orange Card paperwork so that colonoscopy can be scheduled.  Problems for future follow-up -Left hand 3rd digit swelling -Left knee pain  The patient will return for diabetes follow-up in a month or earlier if needed.   Attestation of Supervision of Student:  I  confirm that I have verified the information documented in the medical student's note and that I have also personally performed the history, physical exam and all medical decision making activities.  I have verified that all services and findings are accurately documented in this student's note; and I agree with management and plan as outlined in the documentation. I have also made any necessary editorial changes.  Lenoria Chime, MD [place]

## 2021-05-21 NOTE — Patient Instructions (Addendum)
Fue maravilloso verte hoy.  Por favor traiga TODOS sus medicamentos a cada visita.  Hoy hablamos de:  - Para su diabetes, contine con sus medicamentos - Termina el papeleo de tu tarjeta naranja para que podamos hacerte una colonoscopia. - Llame y programe un examen de la vista para diabticos con: [employer], [place] [address]  [phone]    [facility] [place] familiar [place].  Llame al [phone] si tiene alguna pregunta sobre la cita de [place].  Asegrese de programar un seguimiento en la recepcin antes de irse hoy.  Yehuda Savannah, MD Medicina Familiar    It was wonderful to see you today.  Please bring ALL of your medications with you to every visit.   Today we talked about:  - For your diabetes, continue your medicines - Please finish up your orange card paperwork so  we can get you a colonoscopy - Please call and schedule a diabetic eye exam with : [facility]   [address] [place], [place] [address]   [phone]     Thank you for choosing [facility].   Please call [phone] with any questions about today's appointment.  Please be sure to schedule follow up at the front  desk before you leave today.   Yehuda Savannah, MD  Family Medicine

## 2021-05-24 ENCOUNTER — Other Ambulatory Visit: Payer: Self-pay

## 2021-05-25 ENCOUNTER — Other Ambulatory Visit: Payer: Self-pay

## 2021-05-26 ENCOUNTER — Other Ambulatory Visit: Payer: Self-pay

## 2021-06-02 ENCOUNTER — Ambulatory Visit (INDEPENDENT_AMBULATORY_CARE_PROVIDER_SITE_OTHER): Payer: Self-pay | Admitting: Pharmacist

## 2021-06-02 ENCOUNTER — Other Ambulatory Visit: Payer: Self-pay

## 2021-06-02 DIAGNOSIS — E669 Obesity, unspecified: Secondary | ICD-10-CM

## 2021-06-02 DIAGNOSIS — E1169 Type 2 diabetes mellitus with other specified complication: Secondary | ICD-10-CM

## 2021-06-02 NOTE — Progress Notes (Signed)
Subjective:    Patient ID: Meredith Maynard, female    DOB: [birth year], 55 y.o.   MRN: [id number]  HPI Patient is a 55 y.o. female who presents for diabetes management. She is in good spirits and presents without assistance. Utilized intepreting services ID [id number]. Patient was referred on 03/26/21 and last seen by Primary Care Provider, on 05/21/21.   Patient states "I was feeling good. I have been in the 150's-160's. Around 2 hours after eating it is around 200-210." She reports that she does not currently have Trulicity '3mg'$  pens so she has been taking Trulicity 1.'5mg'$  every 4 days.  Patient reports diabetes was diagnosed in 2006.   Insurance coverage/medication affordability: self-pay  Current diabetes medications include: Trulicity 1.'5mg'$ , metformin '1000mg'$  BID Current hypertension medications include: lisinopril '20mg'$  Current hyperlipidemia medications include: ezetimibe '10mg'$  Patient states that She is taking her medications as prescribed. Patient denies adherence with medications. Patient states that She misses her medications, especially metformin.  Do you feel that your medications are working for you?  Yes  Have you been experiencing any side effects to the medications prescribed? Yes; Trulicity she has a little bit of nausea  Do you have any problems obtaining medications due to transportation or finances?  No   Patient denies hypoglycemic events. Patient denies polyuria (increased urination). Patient denies polyphagia (increased appetite).  Patient denies polydipsia (increased thirst).  Patient denies neuropathy (nerve pain) but was noted on last foot exam by Dr. Thompson Grayer on 05/21/21.  Patient reports visual changes. Patient reports seeing a black dot in her vision field of her left eye. PCP aware and provided lists of offices. Patient is waiting for renewal of orange card to schedule appt with eye doctor. Patient reports self foot exams.   Home fasting blood sugars: lowest  145; usually around mid to high 100's Home random blood sugars: low 200's  Objective:   Labs:   Physical Exam Neurological:     Mental Status: She is alert and oriented to person, place, and time.    Review of Systems  Gastrointestinal:  Positive for nausea. Negative for vomiting.   Lab Results  Component Value Date   HGBA1C 9.2 (H) 03/25/2021   HGBA1C 9.9 (A) 12/30/2020   HGBA1C 8.1 (A) 06/24/2020   Last Weight  Most recent update: 06/02/2021  3:37 PM    Weight  99.8 kg (220 lb)              Lab Results  Component Value Date   MICRALBCREAT <30 09/23/2019    Lipid Panel     Component Value Date/Time   CHOL 184 12/30/2020 1158   TRIG 457 (H) 12/30/2020 1158   HDL 38 (L) 12/30/2020 1158   CHOLHDL 4.8 (H) 12/30/2020 1158   CHOLHDL 3.2 10/14/2016 0835   VLDL 33 (H) 10/14/2016 0835   LDLCALC 74 12/30/2020 1158   LDLDIRECT 108 (H) 03/12/2014 1002    Clinical Atherosclerotic Cardiovascular Disease (ASCVD): No  The 10-year ASCVD risk score Mikey Bussing DC Jr., et al., 2013) is: 3.8%   Values used to calculate the score:     Age: 62 years     Sex: Female     Is Non-Hispanic African American: No     Diabetic: Yes     Tobacco smoker: No     Systolic Blood Pressure: 123XX123 mmHg     Is BP treated: Yes     HDL Cholesterol: 38 mg/dL     Total Cholesterol: 184 mg/dL  Assessment/Plan:   T2DM is not controlled likely due to suboptimal adherence. Patient previously did not tolerate Jardiance. Patient willing to continue Trulicity at '3mg'$  despite having mild nausea. Discussed with patient blood glucose is still elevated and that the next step would likely be a small dose of basal insulin. Patient is not agreeable to discussing insulin at this time. She requests to improve on adherence and have her A1C checked at the end of the month before discussing further. Following instruction patient verbalized understanding of treatment plan.    Sent in prescription to [facility] for GLP-1  dulaglutide (Trulicity) '3mg'$  once weekly (Sundays). Continued metformin '1000mg'$  BID Extensively discussed pathophysiology of diabetes, dietary effects on blood sugar control, and recommended lifestyle interventions. Counseled on s/sx of and management of hypoglycemia Next A1C anticipated August 2022.   ASCVD risk - primary prevention in patient with diabetes. Last LDL is controlled. ASCVD risk score is not >20%  Patient with history of intolerance to statin therapy.  Continued ezetimibe '10mg'$ .  Follow-up appointment with PCP in 4 weeks to review sugar readings and obtain A1C. Will defer follow-up appointment in pharmacy clinic until after patient is seen by PCP. Written patient instructions provided.  This appointment required 30 minutes of direct patient care.  Thank you for involving pharmacy to assist in providing this patient's care.

## 2021-06-02 NOTE — Assessment & Plan Note (Signed)
T2DM is not controlled likely due to suboptimal adherence. Patient previously did not tolerate Jardiance. Patient willing to continue Trulicity at '3mg'$  despite having mild nausea. Discussed with patient blood glucose is still elevated and that the next step would likely be a small dose of basal insulin. Patient is not agreeable to discussing insulin at this time. She requests to improve on adherence and have her A1C checked at the end of the month before discussing further. Following instruction patient verbalized understanding of treatment plan.    1. Sent in prescription to [facility] for GLP-1 dulaglutide (Trulicity) '3mg'$  once weekly (Sundays). 2. Continued metformin '1000mg'$  BID 3. Extensively discussed pathophysiology of diabetes, dietary effects on blood sugar control, and recommended lifestyle interventions. 4. Counseled on s/sx of and management of hypoglycemia 5. Next A1C anticipated August 2022.

## 2021-06-02 NOTE — Patient Instructions (Addendum)
Sra. Mortera-Contreras fue un placer verla hoy.  Por favor haz lo siguiente:  1. Estoy enviando una nueva receta para la dosis de 3 mg de Trulicity. Lo tomars una vez a la semana. 2. Contine controlando los niveles de Dispensing optician en casa. Es muy importante que los registre y los lleve a su prxima cita con el mdico. 3. Contine haciendo los cambios de estilo de vida que hemos discutido juntos durante nuestra visita. La dieta y el ejercicio juegan un papel importante en la mejora de los niveles de azcar en la [place]. 4. Seguimiento con el Dr. Estella Husk 26 de agosto y luego ella puede programar una cita de seguimiento conmigo.   Hipoglucemia o nivel bajo de azcar en la sangre:  El nivel bajo de azcar en la sangre puede ocurrir rpidamente y convertirse en una emergencia si no se trata de inmediato.  Si bien esto no debera suceder con frecuencia, puede ocurrir si se salta una comida o no come lo suficiente. Adems, si su insulina u otros medicamentos para la diabetes tienen una dosis demasiado alta, esto puede hacer que su nivel de azcar en la sangre baje.  Las seales de advertencia de niveles bajos de azcar en la sangre incluyen: 1. Sentirse tembloroso o mareado 2. Sentirse dbil o cansado 3. Hambre excesiva 4. Sentirse ansioso o molesto 5. Sudar incluso cuando no haces ejercicio  Qu hacer si tengo un nivel bajo de azcar en la sangre? Sigue la regla del 15 1. Controle su nivel de azcar en la sangre con su medidor. Si es inferior a 70, contine con el paso 2. 2. Trate con 15 gramos de carbohidratos de accin rpida que se encuentran en 3-4 tabletas de glucosa. Si no hay ninguno disponible, puede probar con caramelos duros, 1 cucharada de azcar o miel, 4 onzas de jugo de frutas o 6 onzas de refresco NORMAL. 3. Vuelva a controlar su nivel de azcar en 15 minutos. Si todava est por debajo de 70, vuelva a hacer lo que hizo en el paso 2. Si su nivel de azcar en la sangre ha  vuelto a subir, contine y coma un refrigerio o una comida pequea compuesta de carbohidratos complejos (p. ej., granos integrales) y protenas en este momento para evitar la recurrencia del nivel bajo de azcar en la sangre.  Ms. Weinel it was a pleasure seeing you today.   Please do the following:  I am sending in a new prescription for the '3mg'$  dose of Trulicity. You will take this once a week.  Continue checking blood sugars at home. It's really important that you record these and bring these in to your next doctor's appointment.  Continue making the lifestyle changes we've discussed together during our visit. Diet and exercise play a significant role in improving your blood sugars.  Follow-up with Dr. Thompson Grayer on August 26th and then she can schedule a follow-up appointment with me.    Hypoglycemia or low blood sugar:   Low blood sugar can happen quickly and may become an emergency if not treated right away.   While this shouldn't happen often, it can be brought upon if you skip a meal or do not eat enough. Also, if your insulin or other diabetes medications are dosed too high, this can cause your blood sugar to go to low.   Warning signs of low blood sugar include: Feeling shaky or dizzy Feeling weak or tired  Excessive hunger Feeling anxious or upset  Sweating even  when you aren't exercising  What to do if I experience low blood sugar? Follow the Rule of 15 Check your blood sugar with your meter. If lower than 70, proceed to step 2.  Treat with 15 grams of fast acting carbs which is found in 3-4 glucose tablets. If none are available you can try hard candy, 1 tablespoon of sugar or honey,4 ounces of fruit juice, or 6 ounces of REGULAR soda.  Re-check your sugar in 15 minutes. If it is still below 70, do what you did in step 2 again. If your blood sugar has come back up, go ahead and eat a snack or small meal made up of complex carbs (ex. Whole grains) and protein at this time  to avoid recurrence of low blood sugar.

## 2021-06-09 NOTE — Progress Notes (Signed)
SUBMITTED DOSE INCREASE TO [facility] ([facility] FOR TRULICTY '3MG'$ .

## 2021-06-22 NOTE — Progress Notes (Signed)
    SUBJECTIVE:   CHIEF COMPLAINT / HPI:   T2DM- taking metformin 123XX123 mg BID and Trulicity '3mg'$  weekly. A1c is 9.1 today. Has been checking BG at home, usu fasting 130-200s, mostly 150s. Does not have abdominal pain with the trulicity and minimal nausea.   HTN- taking lisinopril '20mg'$  daily. No side effects  HLD- taking Zetia '10mg'$  daily. No side effects tolerated well.  Anemia- taking iron every other day, some constipation but overall tolerating well. Has applied for orange card but has not heard back yet.   PERTINENT  PMH / PSH: as above  OBJECTIVE:   BP 118/78   Pulse 74   Ht '5\' 5"'$  (1.651 m)   Wt 217 lb (98.4 kg)   SpO2 96%   BMI 36.11 kg/m   General: alert & oriented, no apparent distress, well groomed HEENT: normocephalic, atraumatic, EOM grossly intact, oral mucosa moist, neck supple Respiratory: normal respiratory effort GI: non-distended Skin: no rashes, no jaundice Psych: appropriate mood and affect   ASSESSMENT/PLAN:   Hypertension associated with diabetes ([facility]) - well controlled today, continue home meds  Diabetes mellitus type 2 in obese ([facility]) - A1c 9.1 today, has been on Trulicity '3mg'$  for one month, will continue to take and f/u with pharmacy in 1 month, and myself in three months, expect once has been on this dose for longer that A1c will continue to improve, continue metformin as well  Secondary diabetes mellitus with hypercholesterolemia ([place]) - Continue Zetia '10mg'$  daily, previously intolerant to multiple statins, repeat LDL in one month at pharmacy visit  Microcytic anemia - taking iron every other day, some constipation but tolerating well - has applied for orange card, waiting to hear back, when recieves will be due for colonoscopy - had normal menses again in July, will continue to monitor, suspect perimenopausal but if not resolving discussed may need TVUS and endometrial biopsy    Fourth COVID booster given today.  Lenoria Chime, MD [place]

## 2021-06-25 ENCOUNTER — Other Ambulatory Visit: Payer: Self-pay

## 2021-06-25 ENCOUNTER — Encounter: Payer: Self-pay | Admitting: Family Medicine

## 2021-06-25 ENCOUNTER — Ambulatory Visit (INDEPENDENT_AMBULATORY_CARE_PROVIDER_SITE_OTHER): Payer: Self-pay | Admitting: Family Medicine

## 2021-06-25 ENCOUNTER — Ambulatory Visit (INDEPENDENT_AMBULATORY_CARE_PROVIDER_SITE_OTHER): Payer: Self-pay

## 2021-06-25 VITALS — BP 118/78 | HR 74 | Ht 65.0 in | Wt 217.0 lb

## 2021-06-25 DIAGNOSIS — I152 Hypertension secondary to endocrine disorders: Secondary | ICD-10-CM

## 2021-06-25 DIAGNOSIS — E1169 Type 2 diabetes mellitus with other specified complication: Secondary | ICD-10-CM

## 2021-06-25 DIAGNOSIS — E669 Obesity, unspecified: Secondary | ICD-10-CM

## 2021-06-25 DIAGNOSIS — E7800 Pure hypercholesterolemia, unspecified: Secondary | ICD-10-CM

## 2021-06-25 DIAGNOSIS — E1159 Type 2 diabetes mellitus with other circulatory complications: Secondary | ICD-10-CM

## 2021-06-25 DIAGNOSIS — D509 Iron deficiency anemia, unspecified: Secondary | ICD-10-CM

## 2021-06-25 DIAGNOSIS — Z23 Encounter for immunization: Secondary | ICD-10-CM

## 2021-06-25 DIAGNOSIS — E1369 Other specified diabetes mellitus with other specified complication: Secondary | ICD-10-CM

## 2021-06-25 LAB — POCT GLYCOSYLATED HEMOGLOBIN (HGB A1C): HbA1c, POC (controlled diabetic range): 9.1 % — AB (ref 0.0–7.0)

## 2021-06-25 NOTE — Assessment & Plan Note (Signed)
-   A1c 9.1 today, has been on Trulicity '3mg'$  for one month, will continue to take and f/u with pharmacy in 1 month, and myself in three months, expect once has been on this dose for longer that A1c will continue to improve, continue metformin as well

## 2021-06-25 NOTE — Assessment & Plan Note (Signed)
-   Continue Zetia '10mg'$  daily, previously intolerant to multiple statins, repeat LDL in one month at pharmacy visit

## 2021-06-25 NOTE — Assessment & Plan Note (Addendum)
-   taking iron every other day, some constipation but tolerating well - has applied for orange card, waiting to hear back, when recieves will be due for colonoscopy - had normal menses again in July, will continue to monitor, suspect perimenopausal but if not resolving discussed may need TVUS and endometrial biopsy

## 2021-06-25 NOTE — Patient Instructions (Addendum)
Fue maravilloso verte hoy.  Por favor traiga TODOS sus medicamentos a cada visita.  Hoy hablamos de:  - Le hice una cita de seguimiento en 1 mes con [facility], contine con sus medicamentos para la diabetes ahora mismo. - F/u conmigo en 3 meses - Avsame cuando te enteres de tu tarjeta naranja.   [place] [facility] por elegir Medicina familiar [place].  Llame al [phone] si tiene alguna pregunta sobre la cita de [place].  Asegrese de programar un seguimiento en la recepcin antes de irse hoy.  Yehuda Savannah, MD Medicina Familiar  It was wonderful to see you today.  Please bring ALL of your medications with you to every visit.   Today we talked about:  - I have made you a follow up appointment in 1 month with [facility], continue your diabetes medications right now - F/u with me in 3 months - Let me know when you hear about your orange card - We gave you your fourth COVID booster today!   Thank you for choosing [facility].   Please call [phone] with any questions about today's appointment.  Please be sure to schedule follow up at the front  desk before you leave today.   Yehuda Savannah, MD  Family Medicine

## 2021-06-25 NOTE — Assessment & Plan Note (Signed)
-   well controlled today, continue home meds

## 2021-06-28 ENCOUNTER — Other Ambulatory Visit: Payer: Self-pay | Admitting: Family Medicine

## 2021-06-28 DIAGNOSIS — E669 Obesity, unspecified: Secondary | ICD-10-CM

## 2021-06-28 DIAGNOSIS — E1169 Type 2 diabetes mellitus with other specified complication: Secondary | ICD-10-CM

## 2021-06-28 NOTE — Telephone Encounter (Signed)
Provider not at this practice

## 2021-06-29 ENCOUNTER — Other Ambulatory Visit: Payer: Self-pay | Admitting: *Deleted

## 2021-06-29 DIAGNOSIS — E1169 Type 2 diabetes mellitus with other specified complication: Secondary | ICD-10-CM

## 2021-06-29 MED ORDER — METFORMIN HCL 1000 MG PO TABS: 1000.0000 mg | ORAL_TABLET | Freq: Two times a day (BID) | ORAL | 3 refills | Status: DC

## 2021-07-27 ENCOUNTER — Ambulatory Visit (INDEPENDENT_AMBULATORY_CARE_PROVIDER_SITE_OTHER): Payer: Self-pay | Admitting: Pharmacist

## 2021-07-27 ENCOUNTER — Other Ambulatory Visit: Payer: Self-pay

## 2021-07-27 DIAGNOSIS — E669 Obesity, unspecified: Secondary | ICD-10-CM

## 2021-07-27 DIAGNOSIS — E1169 Type 2 diabetes mellitus with other specified complication: Secondary | ICD-10-CM

## 2021-07-27 NOTE — Patient Instructions (Addendum)
Meredith Maynard fue un placer verla hoy.  Por favor haz lo siguiente:  1. Contine tomando su Trulicity 3 mg una vez a la [employer] se le indic hoy durante su cita. Si tiene alguna pregunta o si cree que ha ocurrido algo debido a este cambio, llmeme a m oa su mdico para informarnos. 2. Contine controlando los niveles de Dispensing optician en casa. Es muy importante que los registre y los lleve a su prxima cita con el mdico. 3. Contine haciendo los cambios de estilo de vida que hemos discutido juntos durante nuestra visita. La dieta y el ejercicio juegan un papel importante en la mejora de los niveles de azcar en la [place]. 4. Seguimiento con PCP en 2 semanas. Seguimiento conmigo en 6 semanas.   Hipoglucemia o nivel bajo de azcar en la sangre:  El nivel bajo de azcar en la sangre puede ocurrir rpidamente y convertirse en una emergencia si no se trata de inmediato.  Si bien esto no debera suceder con frecuencia, puede ocurrir si se salta una comida o no come lo suficiente. Adems, si su insulina u otros medicamentos para la diabetes tienen una dosis demasiado alta, esto puede hacer que su nivel de azcar en la sangre baje.  Las seales de advertencia de niveles bajos de azcar en la sangre incluyen: 1. Sentirse tembloroso o mareado 2. Sentirse dbil o cansado 3. Hambre excesiva 4. Sentirse ansioso o molesto 5. Sudar incluso cuando no haces ejercicio  Qu hacer si tengo un nivel bajo de azcar en la sangre? Sigue la regla del 15 1. Controle su nivel de azcar en la sangre con su medidor. Si es inferior a 70, contine con el paso 2. 2. Trate con 15 gramos de carbohidratos de accin rpida que se encuentran en 3-4 tabletas de glucosa. Si no hay ninguno disponible, puede probar con caramelos duros, 1 cucharada de azcar o miel, 4 onzas de jugo de frutas o 6 onzas de refresco NORMAL. 3. Vuelva a controlar su nivel de azcar en 15 minutos. Si todava est por debajo de 70,  vuelva a hacer lo que hizo en el paso 2. Si su nivel de azcar en la sangre ha vuelto a subir, contine y coma un refrigerio o una comida pequea compuesta de carbohidratos complejos (p. ej., granos integrales) y protenas en este momento para evitar la recurrencia del nivel bajo de azcar en la sangre.

## 2021-07-27 NOTE — Progress Notes (Signed)
Subjective:    Patient ID: Meredith Maynard, female    DOB: February 05, 1966, 55 y.o.   MRN: [id number]  HPI Patient is a 55 y.o. female who presents for diabetes management. She is in good spirits and presents without assistance. Patient was referred on 03/26/21 and last seen by Primary Care Provider, on 06/25/2021. She was last seen in pharmacy clinic on 06/02/2021. Elita Quick, PharmD - PGY1 Pharmacy Resident spoke with patient in [place].  She reports that diabetes is going better. Reports blood glucose readings being 85 and some being in 200's sometimes. She receives manufacturer assistance Trulicity 3 mg pens and reports having 1-2 new boxes, having started the second pen from one of the boxes last Sunday. She endorses not checking her blood glucose very often.  Patient also complains of pain in left quadrant and rates it a 4 on a 10-point scale. She says it comes and goes. Attributes this pain to utilizing Trulicity. She reports wanting to check her blood glucose when she has the pain to see if it is related to having high blood sugars but has not been able to.   Patient complains of having intense hot flashes every single night. She reports that they wake her up and she is having trouble sleeping. She also reports that she feels them during the day and gave example of being in church and needing a fan to cool down while other people do not. She attributes this to menopause. She is very frustrated with her symptoms and reports that they are affecting her relationship. She shares that she and her husband are undergoing a separation, which she believes could be stemming from her own decreased libido during menopause. She and her husband are still living together but in separate bedrooms. She reports that she does not want to officially leave because she would still like to see her children and is worried about not having food or housing should she leave. She volunteers that her husband is NOT  aggressive towards her. She endorses spontaneously crying a lot. She feels that the stress of menopause and her spouse situation are contributing to elevated blood sugars.   Patient reports diabetes was diagnosed in 2006.   Insurance coverage/medication affordability: self-pay  Current diabetes medications include: Trulicity 1.5mg , metformin 1000mg  BID Current hypertension medications include: lisinopril 20mg  Current hyperlipidemia medications include: ezetimibe 10mg  Patient states that She is taking her medications as prescribed. Patient reports adherence with medications.   Do you feel that your medications are working for you?  Yes  Have you been experiencing any side effects to the medications prescribed? Yes, just a little bit of nausea but not very often  Do you have any problems obtaining medications due to transportation or finances?  No   Patient denies hypoglycemic events. Patient denies polyuria (increased urination), wakes up 1-2 times at night to go to bathroom Patient reports polyphagia (increased appetite) Patient denies polydipsia (increased thirst), feels she is really thirsty at bedtime.  Patient denies neuropathy (nerve pain) but was noted on last foot exam by Dr. Thompson Grayer on 05/21/21.   Reports not eating American food. 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    Review of Systems  Gastrointestinal:  Positive for nausea. Negative for vomiting.   Lab Results  Component Value Date   HGBA1C 9.1 (A) 06/25/2021   HGBA1C 9.2 (H) 03/25/2021   HGBA1C 9.9  (A) 12/30/2020       Lab Results  Component Value Date   MICRALBCREAT <30 09/23/2019    Lipid Panel     Component Value Date/Time   CHOL 184 12/30/2020 1158   TRIG 457 (H) 12/30/2020 1158   HDL 38 (L) 12/30/2020 1158   CHOLHDL 4.8 (H) 12/30/2020 1158   CHOLHDL 3.2 10/14/2016 0835   VLDL 33 (H) 10/14/2016 0835   LDLCALC 74 12/30/2020 1158   LDLDIRECT 108 (H) 03/12/2014 1002    Clinical Atherosclerotic Cardiovascular Disease (ASCVD): No  The 10-year ASCVD risk score (Arnett DK, et al., 2019) is: 5.2%   Values used to calculate the score:     Age: 21 years     Sex: Female     Is Non-Hispanic African American: No     Diabetic: Yes     Tobacco smoker: No     Systolic Blood Pressure: 970 mmHg     Is BP treated: Yes     HDL Cholesterol: 38 mg/dL     Total Cholesterol: 184 mg/dL    Assessment/Plan:   Patient with complaints of hot flashes likely due to menopause which is currently untreated. Thanked patient for feeling comfortable to share personal details. Discussed with patient that should schedule appointment with her PCP to address symptoms and concerns.   Scheduled visit with PCP in 2 weeks to discuss concerns and potential treatment plan.   T2DM is not controlled likely due to suboptimal adherence and other current social issues. Discussed with patient increasing Trulicity since she is tolerating well with minimal side effects. Patient is not agreeable to this. She would like to get menopausal symptoms under control first as she feels that the stress of the situation will likely help improve her blood glucose. Requested she bring in meter for next visit. Following instructions, patient verbalized understanding of treatment plan.    Continued Trulicity (dulaglutide) 3 mg once a week Continued metformin 1000mg  BID Extensively discussed pathophysiology of diabetes, dietary effects on blood sugar control, and recommended lifestyle interventions. Counseled on s/sx of and  management of hypoglycemia Next A1C anticipated November 2022.    Follow-up appointment with PCP in 2 weeks to address menopausal symptoms. Follow-up with pharmacy clinic in 6 weeks to review sugar readings and obtain A1C. Written patient instructions provided.  This appointment required 30 minutes of direct patient care.  Thank you for involving pharmacy to assist in providing this patient's care.  Patient seen with Elita Quick, PharmD - PGY1 Pharmacy Resident

## 2021-07-29 ENCOUNTER — Ambulatory Visit: Payer: Self-pay | Admitting: Pharmacist

## 2021-07-29 NOTE — Assessment & Plan Note (Signed)
T2DM is not controlled likely due to suboptimal adherence and other current social issues. Discussed with patient increasing Trulicity since she is tolerating well with minimal side effects. Patient is not agreeable to this. She would like to get menopausal symptoms under control first as she feels that the stress of the situation will likely help improve her blood glucose. Requested she bring in meter for next visit. Following instructions, patient verbalized understanding of treatment plan.    1. Continued Trulicity (dulaglutide) 3 mg once a week 2. Continued metformin 1000mg  BID 3. Extensively discussed pathophysiology of diabetes, dietary effects on blood sugar control, and recommended lifestyle interventions. 4. Counseled on s/sx of and management of hypoglycemia 5. Next A1C anticipated November 2022.

## 2021-08-09 NOTE — Progress Notes (Signed)
    SUBJECTIVE:   CHIEF COMPLAINT / HPI:   Menopause symptoms- hot flashes and night sweats esp in the morning for the past year. Only three periods this year, none since May. Wondering what options exist for treatment and how long this will last. They have been getting worse in the past month, were just every few days before but now happening nightly and affecting her sleep and ability to get things done during the day.   T2DM- following with pharmacy team. Needs refill of 3mg  of Trulicity.  PERTINENT  PMH / PSH: as above  OBJECTIVE:   BP 139/74   Pulse 74   Ht 5\' 5"  (1.651 m)   Wt 223 lb 6.4 oz (101.3 kg)   SpO2 100%   BMI 37.18 kg/m   General: alert & oriented, no apparent distress, well groomed HEENT: normocephalic, atraumatic, EOM grossly intact, oral mucosa moist, neck supple Respiratory: normal respiratory effort GI: non-distended Skin: no rashes, no jaundice Psych: appropriate mood and affect   ASSESSMENT/PLAN:   Hot flashes due to menopause - patient symptoms including no menses since May and nightly sweats/hot flashes consistent with menopause - discussed options including HRT which she did not want to do, discussed venlafaxine and she would like to try this  - Will start 1 capsule daily x 1 week and increase to 2 capsules daily - discussed if SI or worsening mood to stop medication and call, also discussed potentiall for GI side effects    Message sent to pharmacy to assist with Trulicity refills.  Lenoria Chime, MD [place]

## 2021-08-10 ENCOUNTER — Telehealth: Payer: Self-pay

## 2021-08-10 ENCOUNTER — Encounter: Payer: Self-pay | Admitting: Family Medicine

## 2021-08-10 ENCOUNTER — Other Ambulatory Visit: Payer: Self-pay

## 2021-08-10 ENCOUNTER — Ambulatory Visit (INDEPENDENT_AMBULATORY_CARE_PROVIDER_SITE_OTHER): Payer: Self-pay | Admitting: Family Medicine

## 2021-08-10 VITALS — BP 139/74 | HR 74 | Ht 65.0 in | Wt 223.4 lb

## 2021-08-10 DIAGNOSIS — N951 Menopausal and female climacteric states: Secondary | ICD-10-CM

## 2021-08-10 MED ORDER — VENLAFAXINE HCL ER 37.5 MG PO CP24: ORAL_CAPSULE | ORAL | 2 refills | Status: DC

## 2021-08-10 NOTE — Telephone Encounter (Signed)
Pharmacy calls nurse line requesting clarification on Venlafaxine rx. Clarification needed on if patient needs to take medication 2 capsules BID or once daily. Current quantity would not give a one month supply for patient to take 2 capsules BID.   Please advise.   Talbot Grumbling, RN

## 2021-08-10 NOTE — Patient Instructions (Signed)
It was wonderful to see you today.  Please bring ALL of your medications with you to every visit.   Today we talked about:  - He enviado venlafaxina a su farmacia, tome 1 pestaa una vez al da durante una semana y [employer] aumente a 2 pestaas al da si [employer]. Si tiene nuseas intensas, vmitos, diarrea y no los [place], llame a la oficina. si se siente decado o deprimido o tiene pensamientos de hacerse dao, suspenda el medicamento. Si se siente suicida, puede llamar a la lnea directa de suicidio 988. - Programe el seguimiento en 2 meses   Thank you for choosing [facility].   Please call [phone] with any questions about today's appointment.  Please be sure to schedule follow up at the front  desk before you leave today.   Yehuda Savannah, MD  Family Medicine

## 2021-08-12 DIAGNOSIS — N951 Menopausal and female climacteric states: Secondary | ICD-10-CM | POA: Insufficient documentation

## 2021-08-12 NOTE — Assessment & Plan Note (Signed)
-   patient symptoms including no menses since May and nightly sweats/hot flashes consistent with menopause - discussed options including HRT which she did not want to do, discussed venlafaxine and she would like to try this  - Will start 1 capsule daily x 1 week and increase to 2 capsules daily - discussed if SI or worsening mood to stop medication and call, also discussed potentiall for GI side effects

## 2021-09-08 ENCOUNTER — Ambulatory Visit: Payer: Self-pay | Admitting: Family Medicine

## 2021-09-08 ENCOUNTER — Ambulatory Visit: Payer: Self-pay | Admitting: Pharmacist

## 2021-09-08 NOTE — Progress Notes (Deleted)
Subjective:    Patient ID: Meredith Maynard, female    DOB: Jun 05, 1966, 55 y.o.   MRN: [id number]  HPI Patient is a 55 y.o. female who presents for diabetes management. She is in good spirits and presents without assistance. Patient was referred on 03/26/21 and last seen by Primary Care Provider, on 06/25/2021. She was last seen in pharmacy clinic on 06/02/2021. Elita Quick, PharmD - PGY1 Pharmacy Resident spoke with patient in [place].  She reports that diabetes is going better. Reports blood glucose readings being 85 and some being in 200's sometimes. She receives manufacturer assistance Trulicity 3 mg pens and reports having 1-2 new boxes, having started the second pen from one of the boxes last Sunday. She endorses not checking her blood glucose very often.  Patient also complains of pain in left quadrant and rates it a 4 on a 10-point scale. She says it comes and goes. Attributes this pain to utilizing Trulicity. She reports wanting to check her blood glucose when she has the pain to see if it is related to having high blood sugars but has not been able to.   Patient complains of having intense hot flashes every single night. She reports that they wake her up and she is having trouble sleeping. She also reports that she feels them during the day and gave example of being in church and needing a fan to cool down while other people do not. She attributes this to menopause. She is very frustrated with her symptoms and reports that they are affecting her relationship. She shares that she and her husband are undergoing a separation, which she believes could be stemming from her own decreased libido during menopause. She and her husband are still living together but in separate bedrooms. She reports that she does not want to officially leave because she would still like to see her children and is worried about not having food or housing should she leave. She volunteers that her husband is NOT  aggressive towards her. She endorses spontaneously crying a lot. She feels that the stress of menopause and her spouse situation are contributing to elevated blood sugars.   Patient reports diabetes was diagnosed in 2006.   Insurance coverage/medication affordability: self-pay  Current diabetes medications include: Trulicity 1.5mg , metformin 1000mg  BID Current hypertension medications include: lisinopril 20mg  Current hyperlipidemia medications include: ezetimibe 10mg  Patient states that She is taking her medications as prescribed. Patient reports adherence with medications.   Do you feel that your medications are working for you?  Yes  Have you been experiencing any side effects to the medications prescribed? Yes, just a little bit of nausea but not very often  Do you have any problems obtaining medications due to transportation or finances?  No   Patient denies hypoglycemic events. Patient denies polyuria (increased urination), wakes up 1-2 times at night to go to bathroom Patient reports polyphagia (increased appetite) Patient denies polydipsia (increased thirst), feels she is really thirsty at bedtime.  Patient denies neuropathy (nerve pain) but was noted on last foot exam by Dr. Thompson Grayer on 05/21/21.   Reports not eating American food. 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    Review of Systems  Gastrointestinal:  Positive for nausea. Negative for vomiting.   Lab Results  Component Value Date   HGBA1C 9.1 (A) 06/25/2021   HGBA1C 9.2 (H) 03/25/2021   HGBA1C 9.9  (A) 12/30/2020   ?    Lab Results  Component Value Date   MICRALBCREAT <30 09/23/2019    Lipid Panel     Component Value Date/Time   CHOL 184 12/30/2020 1158   TRIG 457 (H) 12/30/2020 1158   HDL 38 (L) 12/30/2020 1158   CHOLHDL 4.8 (H) 12/30/2020 1158   CHOLHDL 3.2 10/14/2016 0835   VLDL 33 (H) 10/14/2016 0835   LDLCALC 74 12/30/2020 1158   LDLDIRECT 108 (H) 03/12/2014 1002    Clinical Atherosclerotic Cardiovascular Disease (ASCVD): No  The 10-year ASCVD risk score (Arnett DK, et al., 2019) is: 7.2%   Values used to calculate the score:     Age: 50 years     Sex: Female     Is Non-Hispanic African American: No     Diabetic: Yes     Tobacco smoker: No     Systolic Blood Pressure: 017 mmHg     Is BP treated: Yes     HDL Cholesterol: 38 mg/dL     Total Cholesterol: 184 mg/dL    Assessment/Plan:   Patient with complaints of hot flashes likely due to menopause which is currently untreated. Thanked patient for feeling comfortable to share personal details. Discussed with patient that should schedule appointment with her PCP to address symptoms and concerns.   Scheduled visit with PCP in 2 weeks to discuss concerns and potential treatment plan.   T2DM is not controlled likely due to suboptimal adherence and other current social issues. Discussed with patient increasing Trulicity since she is tolerating well with minimal side effects. Patient is not agreeable to this. She would like to get menopausal symptoms under control first as she feels that the stress of the situation will likely help improve her blood glucose. Requested she bring in meter for next visit. Following instructions, patient verbalized understanding of treatment plan.    Continued Trulicity (dulaglutide) 3 mg once a week Continued metformin 1000mg  BID Extensively discussed pathophysiology of diabetes, dietary effects on blood sugar control, and recommended lifestyle interventions. Counseled on s/sx of and  management of hypoglycemia Next A1C anticipated November 2022.    Follow-up appointment with PCP in 2 weeks to address menopausal symptoms. Follow-up with pharmacy clinic in 6 weeks to review sugar readings and obtain A1C. Written patient instructions provided.  This appointment required 30 minutes of direct patient care.  Thank you for involving pharmacy to assist in providing this patient's care.  Patient seen with Elita Quick, PharmD - PGY1 Pharmacy Resident

## 2021-09-20 ENCOUNTER — Other Ambulatory Visit: Payer: Self-pay

## 2021-09-20 ENCOUNTER — Ambulatory Visit (INDEPENDENT_AMBULATORY_CARE_PROVIDER_SITE_OTHER): Payer: Self-pay | Admitting: Family Medicine

## 2021-09-20 ENCOUNTER — Ambulatory Visit: Payer: Self-pay | Admitting: Pharmacist

## 2021-09-20 ENCOUNTER — Encounter: Payer: Self-pay | Admitting: Family Medicine

## 2021-09-20 VITALS — BP 132/71 | HR 77 | Ht 65.0 in | Wt 221.4 lb

## 2021-09-20 DIAGNOSIS — D509 Iron deficiency anemia, unspecified: Secondary | ICD-10-CM

## 2021-09-20 DIAGNOSIS — E669 Obesity, unspecified: Secondary | ICD-10-CM

## 2021-09-20 DIAGNOSIS — E7800 Pure hypercholesterolemia, unspecified: Secondary | ICD-10-CM

## 2021-09-20 DIAGNOSIS — E1169 Type 2 diabetes mellitus with other specified complication: Secondary | ICD-10-CM

## 2021-09-20 DIAGNOSIS — M4722 Other spondylosis with radiculopathy, cervical region: Secondary | ICD-10-CM

## 2021-09-20 DIAGNOSIS — N939 Abnormal uterine and vaginal bleeding, unspecified: Secondary | ICD-10-CM

## 2021-09-20 DIAGNOSIS — T7840XA Allergy, unspecified, initial encounter: Secondary | ICD-10-CM

## 2021-09-20 DIAGNOSIS — E1369 Other specified diabetes mellitus with other specified complication: Secondary | ICD-10-CM

## 2021-09-20 DIAGNOSIS — E785 Hyperlipidemia, unspecified: Secondary | ICD-10-CM

## 2021-09-20 DIAGNOSIS — Z1211 Encounter for screening for malignant neoplasm of colon: Secondary | ICD-10-CM

## 2021-09-20 LAB — POCT GLYCOSYLATED HEMOGLOBIN (HGB A1C): HbA1c, POC (controlled diabetic range): 8.6 % — AB (ref 0.0–7.0)

## 2021-09-20 MED ORDER — GABAPENTIN 100 MG PO CAPS: 200.0000 mg | ORAL_CAPSULE | Freq: Two times a day (BID) | ORAL | 3 refills | Status: DC

## 2021-09-20 MED ORDER — FLUTICASONE PROPIONATE 50 MCG/ACT NA SUSP: 2.0000 | Freq: Every day | NASAL | 6 refills | Status: DC

## 2021-09-20 MED ORDER — FERROUS SULFATE 325 (65 FE) MG PO TABS: 325.0000 mg | ORAL_TABLET | ORAL | 2 refills | Status: DC

## 2021-09-20 NOTE — Assessment & Plan Note (Addendum)
-   On Zetia 10 mg, will repeat lipid panel fasting on Wednesday a.m., intolerant of multiple statins previously

## 2021-09-20 NOTE — Patient Instructions (Signed)
It was wonderful to see you today.  Please bring ALL of your medications with you to every visit.   Today we talked about:  - Referral for colonoscopy - make follow-up for pap smear in the next month   Thank you for choosing [facility].   Please call [phone] with any questions about today's appointment.  Please be sure to schedule follow up at the front  desk before you leave today.   Yehuda Savannah, MD  Family Medicine

## 2021-09-20 NOTE — Progress Notes (Signed)
    SUBJECTIVE:   CHIEF COMPLAINT / HPI:   Menopausal symptoms-had her period last week, has lasted about a week and now is almost done just a small amount of vaginal bleeding.  Previously had not had a period prior to May.  She also notes that she has not had hot flashes since her last appointment, and that she never started the venlafaxine as her husband has been sick and it has been a stressful time.  Type 2 diabetes-A1c is 8.6 today.  She has been taking metformin and the 3 mg weekly of her Trulicity.  She is excited about this A1c.  Anemia-she has been taking the iron for 3 months.  She would like to recheck her labs but can come in on Wednesday to do this.  She has been able to get her orange card and would like a referral for a colonoscopy.  Denies any blood in stool.  PERTINENT  PMH / PSH: As above  OBJECTIVE:   BP 132/71   Pulse 77   Ht 5\' 5"  (1.651 m)   Wt 221 lb 6 oz (100.4 kg)   LMP 09/13/2021   SpO2 99%   BMI 36.84 kg/m   General: alert & oriented, no apparent distress, well groomed HEENT: normocephalic, atraumatic, EOM grossly intact, oral mucosa moist, neck supple Respiratory: normal respiratory effort GI: non-distended Skin: no rashes, no jaundice Psych: appropriate mood and affect   ASSESSMENT/PLAN:   Microcytic anemia - We will repeat CBC after 3 months of iron - Referral to GI sent today for colonoscopy  Diabetes mellitus type 2 in obese (HCC) - A1c improved today, continue current regimen  Secondary diabetes mellitus with hypercholesterolemia ([place]) - On Zetia 10 mg, will repeat lipid panel fasting on Wednesday a.m., intolerant of multiple statins previously  Abnormal uterine bleeding - Patient has had 3 periods in 2022, they were previously regular until January 2022, while this could be perimenopause with her obesity discussed the risk for abnormal endometrial cells causing abnormal uterine bleeding - Discussed doing transvaginal ultrasound and  endometrial biopsy with Pap smear next week, she would prefer to wait at this time, discussed risks of undiagnosed endometrial cancer, patient in agreement we will continue to monitor her bleeding patterns    We have made a follow-up next week to do her Pap smear. Discussed going to pharmacy for flu vaccine and COVID booster.  Lenoria Chime, MD [place]

## 2021-09-20 NOTE — Assessment & Plan Note (Signed)
-   We will repeat CBC after 3 months of iron - Referral to GI sent today for colonoscopy

## 2021-09-20 NOTE — Assessment & Plan Note (Signed)
-   A1c improved today, continue current regimen

## 2021-09-20 NOTE — Assessment & Plan Note (Signed)
-   Patient has had 3 periods in 2022, they were previously regular until January 2022, while this could be perimenopause with her obesity discussed the risk for abnormal endometrial cells causing abnormal uterine bleeding - Discussed doing transvaginal ultrasound and endometrial biopsy with Pap smear next week, she would prefer to wait at this time, discussed risks of undiagnosed endometrial cancer, patient in agreement we will continue to monitor her bleeding patterns

## 2021-09-22 ENCOUNTER — Other Ambulatory Visit: Payer: Self-pay

## 2021-09-22 DIAGNOSIS — E1169 Type 2 diabetes mellitus with other specified complication: Secondary | ICD-10-CM

## 2021-09-22 DIAGNOSIS — D509 Iron deficiency anemia, unspecified: Secondary | ICD-10-CM

## 2021-09-22 DIAGNOSIS — E785 Hyperlipidemia, unspecified: Secondary | ICD-10-CM

## 2021-09-23 LAB — CBC
Hematocrit: 35.4 % (ref 34.0–46.6)
Hemoglobin: 11 g/dL — ABNORMAL LOW (ref 11.1–15.9)
MCH: 26 pg — ABNORMAL LOW (ref 26.6–33.0)
MCHC: 31.1 g/dL — ABNORMAL LOW (ref 31.5–35.7)
MCV: 84 fL (ref 79–97)
Platelets: 232 10*3/uL (ref 150–450)
RBC: 4.23 x10E6/uL (ref 3.77–5.28)
RDW: 15.9 % — ABNORMAL HIGH (ref 11.7–15.4)
WBC: 6.8 10*3/uL (ref 3.4–10.8)

## 2021-09-23 LAB — LIPID PANEL
Chol/HDL Ratio: 4.1 ratio (ref 0.0–4.4)
Cholesterol, Total: 203 mg/dL — ABNORMAL HIGH (ref 100–199)
HDL: 50 mg/dL (ref >39)
LDL Chol Calc (NIH): 131 mg/dL — ABNORMAL HIGH (ref 0–99)
Triglycerides: 122 mg/dL (ref 0–149)
VLDL Cholesterol Cal: 22 mg/dL (ref 5–40)

## 2021-09-27 NOTE — Progress Notes (Signed)
    SUBJECTIVE:   CHIEF COMPLAINT / HPI:   Patient presents today for pap smear. Declined EMB previously for abnormal uterine bleeding. Confirmed today she has not had any new vaginal bleeding and still does not want to do EMB at this time.  Discussed lab work- continue PO ferrous sulfate every other day as Hgb improved to 11.0. Continue Ezetimibe and dietary changes as LDL not as goal but she has been intolerant of statins.  Spanish speaking interpretor used for duration of visit   PERTINENT  PMH / PSH: T2DM, HTN, iron deficiency anemia  OBJECTIVE:   BP 112/70   Pulse 60   Wt 219 lb (99.3 kg)   LMP 09/13/2021   SpO2 90%   BMI 36.44 kg/m   General: alert & oriented, no apparent distress, well groomed HEENT: normocephalic, atraumatic, EOM grossly intact, oral mucosa moist, neck supple Respiratory: normal respiratory effort GI: non-distended Skin: no rashes, no jaundice Psych: appropriate mood and affect GU: Normal appearance of labia majora and minora, without lesions. Vagina tissue pink, moist, without lesions or abrasions. Cervix normal appearance, non-friable, without discharge from os.   Jim Desanctis  present as chaperone for GU exam.  ASSESSMENT/PLAN:   Encounter for Papanicolaou smear for cervical cancer screening - pap smear completed, will call patient with results   Planning to get COVID booster and flu vaccine at pharmacy.   Lenoria Chime, MD [place]

## 2021-09-28 ENCOUNTER — Encounter: Payer: Self-pay | Admitting: Family Medicine

## 2021-09-28 DIAGNOSIS — Z789 Other specified health status: Secondary | ICD-10-CM | POA: Insufficient documentation

## 2021-09-29 ENCOUNTER — Other Ambulatory Visit: Payer: Self-pay

## 2021-09-29 ENCOUNTER — Other Ambulatory Visit (HOSPITAL_COMMUNITY)
Admission: RE | Admit: 2021-09-29 | Discharge: 2021-09-29 | Disposition: A | Discharge status: Home / Self Care | Payer: Self-pay | Source: Ambulatory Visit | Attending: Family Medicine | Admitting: Family Medicine

## 2021-09-29 ENCOUNTER — Encounter: Payer: Self-pay | Admitting: Family Medicine

## 2021-09-29 ENCOUNTER — Ambulatory Visit (INDEPENDENT_AMBULATORY_CARE_PROVIDER_SITE_OTHER): Payer: Self-pay | Admitting: Family Medicine

## 2021-09-29 VITALS — BP 112/70 | HR 60 | Wt 219.0 lb

## 2021-09-29 DIAGNOSIS — Z124 Encounter for screening for malignant neoplasm of cervix: Secondary | ICD-10-CM | POA: Insufficient documentation

## 2021-09-29 NOTE — Patient Instructions (Signed)
Fue maravilloso verte hoy.  Por favor traiga TODOS sus medicamentos a cada visita.  Hoy hablamos de:  - Hicimos tu papanicolaou hoy, te llamar con los resultados   Gracias por elegir Medic[facility] familiar [facility].  Llame al [phone] si tiene alguna pregunta sobre la cita de [place].  Asegrese de programar un seguimiento en la recepcin antes de irse hoy.  Yehuda Savannah, MD [facility] Familiar

## 2021-09-29 NOTE — Assessment & Plan Note (Signed)
-   pap smear completed, will call patient with results

## 2021-09-30 LAB — CYTOLOGY - PAP
Comment: NEGATIVE
Diagnosis: NEGATIVE
High risk HPV: NEGATIVE

## 2021-10-01 ENCOUNTER — Other Ambulatory Visit: Payer: Self-pay | Admitting: Family Medicine

## 2021-10-01 DIAGNOSIS — E669 Obesity, unspecified: Secondary | ICD-10-CM

## 2021-10-01 DIAGNOSIS — E1169 Type 2 diabetes mellitus with other specified complication: Secondary | ICD-10-CM

## 2021-10-01 DIAGNOSIS — E1159 Type 2 diabetes mellitus with other circulatory complications: Secondary | ICD-10-CM

## 2021-10-01 DIAGNOSIS — I152 Hypertension secondary to endocrine disorders: Secondary | ICD-10-CM

## 2021-10-08 ENCOUNTER — Telehealth: Payer: Self-pay | Admitting: Family Medicine

## 2021-10-08 NOTE — Telephone Encounter (Signed)
Copied from [place] [id number]. Topic: General - Other >> Oct 07, 2021  1:37 PM Camille Bal, [place] wrote: Reason for KDP:TELMRAJ needs call back for financial assistance

## 2021-10-08 NOTE — Telephone Encounter (Signed)
I return Pt call, LVM inform her that I don't work for [facility] she need to call them and apply for financial with them

## 2021-11-02 ENCOUNTER — Ambulatory Visit: Payer: Self-pay | Admitting: Family Medicine

## 2021-11-02 NOTE — Progress Notes (Deleted)
° ° °  SUBJECTIVE:   CHIEF COMPLAINT / HPI:   T2DM- taking metformin and Trulicity 3mg  weekly.   Has been called for colonoscopy?  Vaginal bleeding?  PERTINENT  PMH / PSH: ***  OBJECTIVE:   There were no vitals taken for this visit.  ***  ASSESSMENT/PLAN:   No problem-specific Assessment & Plan notes found for this encounter.     Lenoria Chime, MD [place]

## 2021-12-10 ENCOUNTER — Telehealth: Payer: Self-pay

## 2021-12-10 NOTE — Telephone Encounter (Signed)
Patient's daughter calls nurse line to check status of shipment on Trulicity. Reports that mother receives medication assistance through [facility].   Will forward to [place] for further advisement.   Talbot Grumbling, RN

## 2021-12-13 NOTE — Telephone Encounter (Signed)
Spoke to Meredith Maynard and son regarding trulicity shipment. Meredith Maynard is out of medication. Informed them both that this specific medication is having shipment delays and gave her [facility] phone number to f/u on shipment [phone]). Also suggested she let her pcp know the situation and see if they have any further suggestions. Meredith Maynard agreed.

## 2021-12-28 ENCOUNTER — Other Ambulatory Visit: Payer: Self-pay

## 2021-12-29 ENCOUNTER — Other Ambulatory Visit: Payer: Self-pay | Admitting: Family Medicine

## 2021-12-29 DIAGNOSIS — E669 Obesity, unspecified: Secondary | ICD-10-CM

## 2021-12-29 DIAGNOSIS — E1159 Type 2 diabetes mellitus with other circulatory complications: Secondary | ICD-10-CM

## 2021-12-29 DIAGNOSIS — I152 Hypertension secondary to endocrine disorders: Secondary | ICD-10-CM

## 2021-12-29 DIAGNOSIS — E1169 Type 2 diabetes mellitus with other specified complication: Secondary | ICD-10-CM

## 2022-01-04 NOTE — Progress Notes (Signed)
? ? ?  SUBJECTIVE:  ? ?CHIEF COMPLAINT / HPI:  ? ?T2DM- taking metformin $RemoveBeforeDE'1000mg'yjaEkSuQfSMoDxa$  BID and Trulicity $RemoveBefor'3mg'ePSJsgYbIAIw$  weekly but trulicity has been on backorder x 6 weeks through [facility], she has not had it. A1c today is 9.4 . Notes her peripheral neuropathy has been worse since she ran out of trulicity.  ? ?HTN- on lisinopril $RemoveBefor'20mg'xMAjsRwEImFn$  daily. Normotensive. No side effects noted.  ? ?Iron deficiency anemia- has been referred for colonoscopy. Taking ferrous sulfate. ? ?Whole body aching/stiffness- x2 weeks, in her muscles, similar to episodes previous. Feels all muscles are tight. Denies noticeable swelling. Denies chest pain, fevers, shortness of breath. Notes whole body feeling heavy. Denies vaginal bleeding or blood in stool. Notes increased stress over past two weeks. Denies feeling sad or hopeless. Denies new medications in past two weeks, notes decreased exercise in past two weeks. ? ?PERTINENT  PMH / PSH: T2DM, HTN, iron deficiency anemia ? ?OBJECTIVE:  ? ?BP 122/68   Pulse 64   Ht $R'5\' 5"'Um$  (1.651 m)   Wt 223 lb 3.2 oz (101.2 kg)   SpO2 98%   BMI 37.14 kg/m?   ?General: alert & oriented, no apparent distress, well groomed ?HEENT: normocephalic, atraumatic, EOM grossly intact, oral mucosa moist, neck supple ?Respiratory: normal respiratory effort ?GI: non-distended ?MSK: muscle non-tender to palpation, no edema or swelling in arms or legs, no joint erythema, warmth or swelling in hands, elbows, feet, ankles ?Skin: no rashes, no jaundice ?Psych: appropriate mood and affect ? ?Dermatologic Exam: ?Nails: No onchomycosis. No nail bed thickening. ?Callouses: No callouses. No fissures. ?Web spaces: No macerations or open lesions ?Redness/Erythema: None ? ?Musculoskeletal Exam: No bunion, hammertoes, prominent metatarsals, collapsed arch, or previous amputation. ?Vascular Assessment: Pedal hair growth present. 2+ posterior tibial and dorsalis pedis pulses. ?Neurologic Exam: 10-gram monofilament exam, R 6/6 and L  6/6. ? ? ? ?ASSESSMENT/PLAN:  ? ?Diabetes mellitus type 2 in obese [facility]) ?- through shared decision making, decided to start glyburide low dose, discussed sign/symptoms of hypoglycemia to watch for, while awaiting Trulicity, discussed when she gets Trulicity can stop the glyburide ?- foot exam wnl today ?- discussed increased gabapentin to $RemoveBefor'300mg'EeOdUGdqFXFm$  BID for worsening peripheral neuropathy ? ?Aching ?- no red flag signs/symptoms, started two weeks ago, in past have checked ESR/CRP and CK and found all to be negative ?- no signs of joint disease or other abnormality on exam ?- through shared decision making, decided monitoring of symptoms for now, with plan for close follow up, if symptoms persist will return for lab work ordered for future order CK/ESR/CRP ? ?Hypertension associated with diabetes ([place]) ?Well controlled, BMP in past year, refills provided ?  ? ? ?Lenoria Chime, MD ?[place]  ? ?

## 2022-01-05 ENCOUNTER — Other Ambulatory Visit: Payer: Self-pay

## 2022-01-05 ENCOUNTER — Encounter: Payer: Self-pay | Admitting: Family Medicine

## 2022-01-05 ENCOUNTER — Ambulatory Visit (INDEPENDENT_AMBULATORY_CARE_PROVIDER_SITE_OTHER): Payer: Self-pay | Admitting: Family Medicine

## 2022-01-05 VITALS — BP 122/68 | HR 64 | Ht 65.0 in | Wt 223.2 lb

## 2022-01-05 DIAGNOSIS — M4722 Other spondylosis with radiculopathy, cervical region: Secondary | ICD-10-CM

## 2022-01-05 DIAGNOSIS — E785 Hyperlipidemia, unspecified: Secondary | ICD-10-CM

## 2022-01-05 DIAGNOSIS — E1169 Type 2 diabetes mellitus with other specified complication: Secondary | ICD-10-CM

## 2022-01-05 DIAGNOSIS — E1159 Type 2 diabetes mellitus with other circulatory complications: Secondary | ICD-10-CM

## 2022-01-05 DIAGNOSIS — E669 Obesity, unspecified: Secondary | ICD-10-CM

## 2022-01-05 DIAGNOSIS — I152 Hypertension secondary to endocrine disorders: Secondary | ICD-10-CM

## 2022-01-05 DIAGNOSIS — D509 Iron deficiency anemia, unspecified: Secondary | ICD-10-CM

## 2022-01-05 DIAGNOSIS — R52 Pain, unspecified: Secondary | ICD-10-CM | POA: Insufficient documentation

## 2022-01-05 DIAGNOSIS — T7840XA Allergy, unspecified, initial encounter: Secondary | ICD-10-CM

## 2022-01-05 LAB — POCT GLYCOSYLATED HEMOGLOBIN (HGB A1C): HbA1c, POC (controlled diabetic range): 9.4 % — AB (ref 0.0–7.0)

## 2022-01-05 MED ORDER — FERROUS SULFATE 325 (65 FE) MG PO TABS: 325.0000 mg | ORAL_TABLET | ORAL | 2 refills | Status: DC

## 2022-01-05 MED ORDER — METFORMIN HCL 1000 MG PO TABS: 1000.0000 mg | ORAL_TABLET | Freq: Two times a day (BID) | ORAL | 3 refills | Status: DC

## 2022-01-05 MED ORDER — EZETIMIBE 10 MG PO TABS: 10.0000 mg | ORAL_TABLET | Freq: Every day | ORAL | 3 refills | Status: DC

## 2022-01-05 MED ORDER — LISINOPRIL 20 MG PO TABS: 20.0000 mg | ORAL_TABLET | Freq: Every day | ORAL | 3 refills | Status: DC

## 2022-01-05 MED ORDER — GABAPENTIN 100 MG PO CAPS: 200.0000 mg | ORAL_CAPSULE | Freq: Two times a day (BID) | ORAL | 3 refills | Status: DC

## 2022-01-05 MED ORDER — GLYBURIDE 1.25 MG PO TABS: 1.2500 mg | ORAL_TABLET | Freq: Every day | ORAL | 0 refills | Status: DC

## 2022-01-05 MED ORDER — CETIRIZINE HCL 10 MG PO TABS: 10.0000 mg | ORAL_TABLET | Freq: Every day | ORAL | 3 refills | Status: DC

## 2022-01-05 NOTE — Assessment & Plan Note (Signed)
-  no red flag signs/symptoms, started two weeks ago, in past have checked ESR/CRP and CK and found all to be negative ?- no signs of joint disease or other abnormality on exam ?- through shared decision making, decided monitoring of symptoms for now, with plan for close follow up, if symptoms persist will return for lab work ordered for future order CK/ESR/CRP ?

## 2022-01-05 NOTE — Assessment & Plan Note (Signed)
-   through shared decision making, decided to start glyburide low dose, discussed sign/symptoms of hypoglycemia to watch for, while awaiting Trulicity, discussed when she gets Trulicity can stop the glyburide ?- foot exam wnl today ?- discussed increased gabapentin to '300mg'$  BID for worsening peripheral neuropathy ?

## 2022-01-05 NOTE — Patient Instructions (Addendum)
Fue maravilloso verte hoy. ? ?Por favor traiga TODOS sus medicamentos a cada visita. ? ?Hoy hablamos de: ? ?Su A1c de hoy fue de 9,4. Mientras esperamos su Trulicity, probemos con 1,25 mg de gliburida al d?a. Coma con una comida abundante y est? atento a los signos/s?ntomas de hipoglucemia: temblores, aturdimiento, mareos. ? ?Llame a trabajar en su ayuda econ?mica para que pueda obtener ayuda para la colonoscopia. ? ?Aumente la gabapentina a 3 tabletas (300 mg) cada 12 horas. ? ?Conan Bowens elegir [facility] [place]. ? ?Llame al [phone] si tiene alguna pregunta sobre la cita de hoy. ? ?Aseg?rese de programar un seguimiento en la recepci?n antes de irse hoy. ? ?Llegue al menos 15 minutos antes de sus citas programadas. ? ?Si le hicieron un an?lisis de sangre hoy, le enviar? un mensaje de MyChart o una carta si los [place] son normales. De lo contrario, te llamar?. ? ?Si le colocaron Clinical research associate, lo llamar?n para programar una cita. Ll?manos si no recibes respuesta en las pr?ximas 2 semanas. ? ?Si necesita recargas adicionales antes de su pr?xima cita, primero llame a su farmacia. ? ?Yehuda Savannah, MD ?[facility] ? ?It was wonderful to see you today. ? ?Please bring ALL of your medications with you to every visit.  ? ?Today we talked about: ? ?Your A1c today was 9.4 - while we wait for your Trulicity, lets try glyburide 1.25 mg daily. Eat with large meal and watch for signs/symptoms of hypoglycemia - shaking, lightheadedness, dizziness.  ? ?Call to work on your financial aid so you can get aid for the colonoscopy. ? ?Increase gabapentin to 3 tabs ('300mg'$ ) every 12 hours. ? ?Thank you for choosing [facility].  ? ?Please call [phone] with any questions about today's appointment. ? ?Please be sure to schedule follow up at the front  desk before you leave today.  ? ?Please arrive at least 15 minutes prior to your scheduled appointments. ?  ?If you had blood work today,  I will send you a MyChart message or a letter if results are normal. Otherwise, I will give you a call. ?  ?If you had a referral placed, they will call you to set up an appointment. Please give [place] a call if you don't hear back in the next 2 weeks. ?  ?If you need additional refills before your next appointment, please call your pharmacy first.  ? ?Yehuda Savannah, MD  ?Family Medicine   ?

## 2022-01-05 NOTE — Assessment & Plan Note (Signed)
Well controlled, BMP in past year, refills provided ?

## 2022-02-07 ENCOUNTER — Encounter: Payer: Self-pay | Admitting: Family Medicine

## 2022-02-07 ENCOUNTER — Ambulatory Visit (INDEPENDENT_AMBULATORY_CARE_PROVIDER_SITE_OTHER): Payer: Self-pay | Admitting: Family Medicine

## 2022-02-07 ENCOUNTER — Ambulatory Visit: Payer: Self-pay | Admitting: Family Medicine

## 2022-02-07 VITALS — BP 130/82 | HR 75 | Wt 226.0 lb

## 2022-02-07 DIAGNOSIS — E1169 Type 2 diabetes mellitus with other specified complication: Secondary | ICD-10-CM

## 2022-02-07 DIAGNOSIS — E669 Obesity, unspecified: Secondary | ICD-10-CM

## 2022-02-07 LAB — POCT GLYCOSYLATED HEMOGLOBIN (HGB A1C): HbA1c, POC (controlled diabetic range): 9.8 % — AB (ref 0.0–7.0)

## 2022-02-07 NOTE — Progress Notes (Deleted)
? ? ? ?  SUBJECTIVE:  ? ?CHIEF COMPLAINT / HPI:  ? ?Meredith Maynard is a 56 y.o. female presents for follow up ? ?Diabetes ?Pt reports she needs A1c for dental clearance. Last A1c was 1 month ago ?..p ? ? ?*** ? ?[place] Office Visit from 01/05/2022 in [place]  ?PHQ-9 Total Score 0  ? ?  ?  ? ?Health Maintenance Due  ?Topic  ? OPHTHALMOLOGY EXAM   ? COLONOSCOPY (Pts 45-66yr Insurance coverage will need to be confirmed)   ? Zoster Vaccines- Shingrix (1 of 2)  ? COVID-19 Vaccine (5 - Booster for [employer])  ? FOOT EXAM   ? ?  ? ?PERTINENT  PMH / PSH:  ? ?OBJECTIVE:  ? ?There were no vitals taken for this visit.  ? ?General: Alert, no acute distress ?Cardio: Normal S1 and S2, RRR, no r/m/g ?Pulm: CTAB, normal work of breathing ?Abdomen: Bowel sounds normal. Abdomen soft and non-tender.  ?Extremities: No peripheral edema.  ?Neuro: Cranial nerves grossly intact  ? ?ASSESSMENT/PLAN:  ? ?No problem-specific Assessment & Plan notes found for this encounter. ?  ? ?PLattie Haw MD PGY-3 ?[facility] ?

## 2022-02-07 NOTE — Progress Notes (Deleted)
? ? ?  SUBJECTIVE:  ? ?CHIEF COMPLAINT / HPI:  ? ?"Needs A1c for upcoming dental procedure": ?Patient is a 56 y.o. female who present today for diabetic follow up. Recently saw her PCP on 01/05/2022 with an A1c of 9.4.  She was started on glyburide low-dose while awaiting Trulicity with a plan to discontinue glyburide when she gets Trulicity.  Today she states***. ? ?***Spanish interpreter used ? ?PERTINENT  PMH / PSH: *** ? ?OBJECTIVE:  ? ?There were no vitals taken for this visit. *** ? ?General: NAD, pleasant, able to participate in exam ?Cardiac: RRR, no murmurs. ?Respiratory: CTAB, normal effort, No wheezes, rales or rhonchi ?Abdomen: Bowel sounds present, nontender, nondistended, no hepatosplenomegaly. ?Extremities: no edema or cyanosis. ?Skin: warm and dry, no rashes noted ?Neuro: alert, no obvious focal deficits ?Psych: Normal affect and mood ? ?ASSESSMENT/PLAN:  ? ?No problem-specific Assessment & Plan notes found for this encounter. ?  ? ? ?Lurline Del, DO ?[place]  ? ? ?{    This will disappear when note is signed, click to select method of visit    :1} ?

## 2022-02-07 NOTE — Patient Instructions (Signed)
?  Gracias por venir a verme hoy. Fue Engineer, drilling. Hoy hablamos de su diabetes, es 9.8 hoy. ? ?Le hemos dado una copia impresa para el dentista. ? ?Por favor, haga un seguimiento con el PCP para control diab?tico en 2 meses. ? ?Si tiene alguna pregunta o inquietud, no dude en llamar a la oficina al [phone]. ? ?Los mejores deseos, ? ?Dr Posey Pronto ? ? ? ?Thank you for coming to see me today. It was a pleasure. Today we discussed your diabetes, it is 9.8 today.  ? ?We have given you a print out for the dentist. ? ?Please follow-up with PCP for diabetic check in 2 months.  ? ?If you have any questions or concerns, please do not hesitate to call the office at [phone]. ? ?Best wishes,  ? ?Dr Posey Pronto   ?

## 2022-02-07 NOTE — Progress Notes (Signed)
? ? ? ?  SUBJECTIVE:  ? ?CHIEF COMPLAINT / HPI:  ? ?Meredith Maynard is a 56 y.o. female presents for follow up  ? ?A spanish speaking interpretor was used for this encounter.   ? ?Dental procedure ?Pt has a dental procedure (molar tooth extraction) tomorrow and dentist is requiring an updating A1c. Most recent A1c was 1 month ago and was 9.4. Pt takes glyburide 1.'25mg'$  daily metformin '1000mg'$  BID.  Waiting for trulicity as it is on back order.  ? ?[place] Office Visit from 02/07/2022 in [place]  ?PHQ-9 Total Score 0  ? ?  ?  ?PERTINENT  PMH / PSH:  ? ?OBJECTIVE:  ? ?BP 130/82   Pulse 75   Wt 226 lb (102.5 kg)   SpO2 98%   BMI 37.61 kg/m?   ? ?General: Alert, no acute distress ?Cardio: well perfused  ?Pulm: normal work of breathing ?Neuro: Cranial nerves grossly intact  ? ?ASSESSMENT/PLAN:  ? ?Diabetes mellitus type 2 in obese [facility]) ?Repeat A1c 9.8. Printed off record for dentist. Follow up A1c in 2 months.  ?  ? ?Lattie Haw, MD PGY-3 ?[place]  ?

## 2022-02-07 NOTE — Assessment & Plan Note (Addendum)
Repeat A1c 9.8. Printed off record for dentist. Follow up A1c in 2 months.  ?

## 2022-02-09 ENCOUNTER — Ambulatory Visit: Payer: Self-pay | Admitting: Family Medicine

## 2022-02-09 NOTE — Progress Notes (Deleted)
? ? ?  SUBJECTIVE:  ? ?CHIEF COMPLAINT / HPI:  ? ?*** ? ?PERTINENT  PMH / PSH: HTN, T2DM ? ?OBJECTIVE:  ? ?There were no vitals taken for this visit.  ?*** ? ?ASSESSMENT/PLAN:  ? ?No problem-specific Assessment & Plan notes found for this encounter. ?  ? ? ?Lenoria Chime, MD ?[place]  ? ?

## 2022-02-15 ENCOUNTER — Other Ambulatory Visit: Payer: Self-pay

## 2022-02-15 DIAGNOSIS — E1169 Type 2 diabetes mellitus with other specified complication: Secondary | ICD-10-CM

## 2022-02-15 MED ORDER — GLYBURIDE 1.25 MG PO TABS: 1.2500 mg | ORAL_TABLET | Freq: Every day | ORAL | 0 refills | Status: DC

## 2022-02-24 ENCOUNTER — Other Ambulatory Visit: Payer: Self-pay | Admitting: Family Medicine

## 2022-02-24 ENCOUNTER — Other Ambulatory Visit: Payer: Self-pay

## 2022-02-24 DIAGNOSIS — E1169 Type 2 diabetes mellitus with other specified complication: Secondary | ICD-10-CM

## 2022-02-25 ENCOUNTER — Other Ambulatory Visit: Payer: Self-pay

## 2022-02-25 MED ORDER — EZETIMIBE 10 MG PO TABS
10.0000 mg | ORAL_TABLET | Freq: Every day | ORAL | 2 refills | Status: DC
  Filled 2022-02-25 – 2022-03-14 (×2): qty 30, 30d supply, fill #0

## 2022-03-03 ENCOUNTER — Ambulatory Visit (INDEPENDENT_AMBULATORY_CARE_PROVIDER_SITE_OTHER): Payer: Self-pay | Admitting: Family Medicine

## 2022-03-03 ENCOUNTER — Encounter: Payer: Self-pay | Admitting: Family Medicine

## 2022-03-03 VITALS — BP 137/65 | HR 62 | Ht 65.0 in | Wt 224.8 lb

## 2022-03-03 DIAGNOSIS — K089 Disorder of teeth and supporting structures, unspecified: Secondary | ICD-10-CM

## 2022-03-03 DIAGNOSIS — I152 Hypertension secondary to endocrine disorders: Secondary | ICD-10-CM

## 2022-03-03 DIAGNOSIS — E669 Obesity, unspecified: Secondary | ICD-10-CM

## 2022-03-03 DIAGNOSIS — L84 Corns and callosities: Secondary | ICD-10-CM

## 2022-03-03 DIAGNOSIS — E1159 Type 2 diabetes mellitus with other circulatory complications: Secondary | ICD-10-CM

## 2022-03-03 DIAGNOSIS — E1169 Type 2 diabetes mellitus with other specified complication: Secondary | ICD-10-CM

## 2022-03-03 MED ORDER — TRULICITY 3 MG/0.5ML ~~LOC~~ SOAJ: 3.0000 mg | SUBCUTANEOUS | 0 refills | Status: DC

## 2022-03-03 NOTE — Assessment & Plan Note (Addendum)
-   not at goal, confirmed with our pharmacist, restart Trulicity '3mg'$  weekly and once she does this can continue to check sugars, if signs or symptoms of hypoglycemia would discontinue the glyburidem, f/u 2 months to repeat A1c ?

## 2022-03-03 NOTE — Assessment & Plan Note (Addendum)
-   shaved today, pt tolerated well, salicyclic acid placed, discussed one drop twice daily for up two weeks until skin softens, but discussed signs of irritation/skin breakdown and not to use if this occurs ?

## 2022-03-03 NOTE — Patient Instructions (Addendum)
It was wonderful to see you today. ? ?Please bring ALL of your medications with you to every visit.  ? ?Today we talked about: ? ?- For your diabetes, I sent in your Trulicity again, you should be getting this soon ?- We shaved down your callouses today, and put 1 drop of salicyclic acid, you can do the salicyclic acid twice daily x 2 weeks ? ? ?Thank you for choosing [facility].  ? ?Please call [phone] with any questions about today's appointment. ? ?Please be sure to schedule follow up at the front  desk before you leave today.  ? ?Please arrive at least 15 minutes prior to your scheduled appointments. ?  ?If you had blood work today, I will send you a MyChart message or a letter if results are normal. Otherwise, I will give you a call. ?  ?If you had a referral placed, they will call you to set up an appointment. Please give [place] a call if you don't hear back in the next 2 weeks. ?  ?If you need additional refills before your next appointment, please call your pharmacy first.  ? ?Yehuda Savannah, MD  ?Family Medicine   ?

## 2022-03-03 NOTE — Assessment & Plan Note (Signed)
-   at goal, tolerating lisinopril, check yearly BMP today ?

## 2022-03-03 NOTE — Progress Notes (Signed)
? ? ?  SUBJECTIVE:  ? ?CHIEF COMPLAINT / HPI:  ? ?T2DM- last A1c on 02/07/22 was 9.8. Currently taking metformin '1000mg'$  BID, and glyburide 1.'25mg'$  daily. Denies signs or symptoms of hypoglycemia. Blood sugars at home running 150-170s, rarely in 200s. These are fasting sugars.  ? ?HTN- taking lisinopril '20mg'$  daily. Last BMP was 02/2021, due today.  ? ?Callous on foot- causing pain. Has salicyclic acid OTC to use on it and corn holes.  ? ?Referral to [facility]. Had to pay $300 to get tooth extracted earlier this month. ? ?PERTINENT  PMH / PSH: T2DM, GERD, HTN ? ?OBJECTIVE:  ? ?BP 137/65   Pulse 62   Ht '5\' 5"'$  (1.651 m)   Wt 224 lb 12.8 oz (102 kg)   SpO2 100%   BMI 37.41 kg/m?   ?General: alert & oriented, no apparent distress, well groomed ?HEENT: normocephalic, atraumatic, EOM grossly intact, oral mucosa moist, neck supple ?Respiratory: normal respiratory effort ?GI: non-distended ?Skin: no rashes, no jaundice ?Psych: appropriate mood and affect ? ?Dermatologic Exam: ?Nails: No onchomycosis. No nail bed thickening. ?Callouses: + 2 callouses on plantar surface of L foot. No fissures. ?Web spaces: No macerations or open lesions ?Redness/Erythema: None ? ?Musculoskeletal Exam: No bunion, hammertoes, prominent metatarsals, collapsed arch, or previous amputation. ?Vascular Assessment: Pedal hair growth present. 2+ posterior tibial and dorsalis pedis pulses. ?Neurologic Exam: 10-gram monofilament exam, R 6/6 and L 6/6. ? ?After soaking callus with warm washcloth, a 15 blade was used to shave down both calluses on left foot. No skin breakdown or bleeding, and patient tolaterated the procedure well. One drop of salicyclic acid 63% from new package placed on each callus, and callus circles/pads placed once dry. Patient tolerated this well. ? ?ASSESSMENT/PLAN:  ? ?Diabetes mellitus type 2 in obese ([facility]) ?- not at goal, confirmed with our pharmacist, restart Trulicity '3mg'$  weekly and once she does this can  continue to check sugars, if signs or symptoms of hypoglycemia would discontinue the glyburidem, f/u 2 months to repeat A1c ? ?Callus of foot ?- shaved today, pt tolerated well, salicyclic acid placed, discussed one drop twice daily for up two weeks until skin softens, but discussed signs of irritation/skin breakdown and not to use if this occurs ? ?Hypertension associated with diabetes ([place]) ?- at goal, tolerating lisinopril, check yearly BMP today ?  ?Will place referral for dentist through [facility]. ? ?Lenoria Chime, MD ?[place]  ? ?

## 2022-03-04 ENCOUNTER — Other Ambulatory Visit: Payer: Self-pay

## 2022-03-04 LAB — BASIC METABOLIC PANEL
BUN/Creatinine Ratio: 27 — ABNORMAL HIGH (ref 9–23)
BUN: 13 mg/dL (ref 6–24)
CO2: 25 mmol/L (ref 20–29)
Calcium: 9.2 mg/dL (ref 8.7–10.2)
Chloride: 104 mmol/L (ref 96–106)
Creatinine, Ser: 0.49 mg/dL — ABNORMAL LOW (ref 0.57–1.00)
Glucose: 192 mg/dL — ABNORMAL HIGH (ref 70–99)
Potassium: 4.4 mmol/L (ref 3.5–5.2)
Sodium: 140 mmol/L (ref 134–144)
eGFR: 111 mL/min/{1.73_m2} (ref >59)

## 2022-03-10 ENCOUNTER — Telehealth: Payer: Self-pay

## 2022-03-10 NOTE — Telephone Encounter (Signed)
Left message informing pt she has medication ready for pickup ? ?1 box of trulicity labeled and ready in med room fridge ?

## 2022-03-14 ENCOUNTER — Other Ambulatory Visit: Payer: Self-pay

## 2022-03-14 NOTE — Telephone Encounter (Signed)
Medication given to patient

## 2022-03-15 ENCOUNTER — Other Ambulatory Visit: Payer: Self-pay

## 2022-04-14 ENCOUNTER — Telehealth: Payer: Self-pay

## 2022-04-14 NOTE — Telephone Encounter (Signed)
Received VM from [place] regarding Trulicity shipment update.   Reports that patient has not been notified of shipment and wanted to check status with our office.   Will forward to pharmacy team for update.   *Patient also has appointment tomorrow morning in [facility] and could pick up any medication samples at that time.   Talbot Grumbling, RN

## 2022-04-15 ENCOUNTER — Ambulatory Visit (INDEPENDENT_AMBULATORY_CARE_PROVIDER_SITE_OTHER): Payer: Self-pay | Admitting: Family Medicine

## 2022-04-15 VITALS — BP 120/80 | HR 74 | Ht 65.0 in | Wt 223.0 lb

## 2022-04-15 DIAGNOSIS — Z8669 Personal history of other diseases of the nervous system and sense organs: Secondary | ICD-10-CM

## 2022-04-15 DIAGNOSIS — R7989 Other specified abnormal findings of blood chemistry: Secondary | ICD-10-CM

## 2022-04-15 DIAGNOSIS — M4722 Other spondylosis with radiculopathy, cervical region: Secondary | ICD-10-CM

## 2022-04-15 MED ORDER — GABAPENTIN 300 MG PO CAPS: 300.0000 mg | ORAL_CAPSULE | Freq: Three times a day (TID) | ORAL | 0 refills | Status: DC

## 2022-04-15 NOTE — Progress Notes (Unsigned)
    SUBJECTIVE:   CHIEF COMPLAINT / HPI: Sciatic pain  Patient presents to clinic for right sciatic pain.  Reports has been ongoing for years.  Pain starts at buttock and travels down back of leg.  She is requesting a letter with diagnosis of sciatica for her employer.  She denies any fevers, weakness, recent falls, worsening of numbness or tingling of lower extremities.  No incontinence of urine or bowel.  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  No tenderness to vertebral process palpation.  Paraspinous muscles are not tender and without spasm.   Range of motion is full at neck and lumbar sacral regions Neurovascular is intact Sensation intact Gait intact  ASSESSMENT/PLAN:   History of sciatica Patient reports history of sciatica.  Recent L-spine 5 years ago did not show any compression.  Could not find other lower imaging MRI for confirmation in epic.  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 Will increase gabapentin to 300 mg 3 times daily Note for work today provided PCP aware of patient's request for note for provider. Recommend patient to follow-up with PCP in 1 to 2 weeks.  Abnormal serum thyroid stimulating hormone (TSH) level Abnormal TSH levels noted from his 2 checks. Will obtain TSH with reflex today Follow-up with PCP in 1 to 2 weeks.     Carollee Leitz, MD [place]

## 2022-04-15 NOTE — Telephone Encounter (Signed)
Mailed renewal app to pt 03/09/22. Enrolled until 04/23/22.  Last shipment 03/09/22, pt had no more refills.  Authorized refills today with [facility] - 1 refill of 4 month supply ('3mg'$  pen) w/ 0 refills. Should ship in 7-10 business days. This will be the last refill until patient returns re-enrollment form.

## 2022-04-15 NOTE — Patient Instructions (Addendum)
Thank you for coming to see me today. It was a pleasure.   Increase Gabapentin to 300 mg three times a day Stop Gabapentin 200 mg two times a day    Please follow-up with PCP in 1 week  If you have any questions or concerns, please do not hesitate to call the office at [phone].  Best,   Carollee Leitz, MD

## 2022-04-16 LAB — TSH RFX ON ABNORMAL TO FREE T4: TSH: 0.512 u[IU]/mL (ref 0.450–4.500)

## 2022-04-18 ENCOUNTER — Encounter: Payer: Self-pay | Admitting: Family Medicine

## 2022-04-18 DIAGNOSIS — R7989 Other specified abnormal findings of blood chemistry: Secondary | ICD-10-CM | POA: Insufficient documentation

## 2022-04-18 DIAGNOSIS — Z8669 Personal history of other diseases of the nervous system and sense organs: Secondary | ICD-10-CM | POA: Insufficient documentation

## 2022-04-18 NOTE — Assessment & Plan Note (Signed)
Abnormal TSH levels noted from his 2 checks. Will obtain TSH with reflex today Follow-up with PCP in 1 to 2 weeks.

## 2022-04-18 NOTE — Assessment & Plan Note (Signed)
Patient reports history of sciatica.  Recent L-spine 5 years ago did not show any compression.  Could not find other lower imaging MRI for confirmation in epic.  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 Will increase gabapentin to 300 mg 3 times daily Note for work today provided PCP aware of patient's request for note for provider. Recommend patient to follow-up with PCP in 1 to 2 weeks.

## 2022-05-09 NOTE — Progress Notes (Signed)
    SUBJECTIVE:   CHIEF COMPLAINT / HPI:   T2DM - last A1c on 02/07/22 was 9.8, today is 11.1. Currently taking only metformin '1000mg'$  BID. Denies signs or symptoms of hypoglycemia.  She ran out of the glyburide and has not been able to get the Trulicity.  HTN - taking lisinopril.  Recent BMP which was normal.  Vaginal irritation-Burns and itching with urination. Vaginal irritation.  Feels like previous yeast infection.  Has not has not been sexually active with her husband in the past 6 months and does not want STD testing.  Callus-has been using over-the-counter salicylic acid and feels it is improved.  No longer having pain on her feet.  No wounds on her feet or pain there.  Right knee pain-she notes this has been present for many years.  She hurt her knee when slipping on water several years ago, she hyperextended it in that injury and had pain after that.  It did not hurt as bad until a few years ago when she had another injury and fell in the bathroom.  She does not notice any swelling or clicking or locking up.  She does notice worsening pain.  She has good and bad days.  She had previously been seen for her sciatica with the gabapentin dose increase which she feels like helped with the psych [place] and sometimes helps with her knee.  She would like an evaluation as she has never had x-rays or ultrasounds before.  PERTINENT  PMH / PSH: Type 2 diabetes, hypertension,  OBJECTIVE:   BP 106/62   Pulse 66   Wt 225 lb (102.1 kg)   LMP 10/31/2021   SpO2 96%   BMI 37.44 kg/m   General: alert & oriented, no apparent distress, well groomed HEENT: normocephalic, atraumatic, EOM grossly intact, oral mucosa moist, neck supple Respiratory: normal respiratory effort GI: non-distended Skin: no rashes, no jaundice Psych: appropriate mood and affect MSK-right knee without effusion, erythema, or warmth.  Negative McMurray's, negative Lachman's, negative valgus or varus stress, negative anterior  posterior drawer test.  Some tenderness to palpation over the proximal medial patella without crepitus.  Negative Clark's test. Left foot-smooth callus on bottom of foot, no ulceration or skin breakdown, normal sensation   ASSESSMENT/PLAN:   Callus of foot Resolved-continue as needed salicylic acid, no signs of wound or skin breakdown  Hypertension associated with diabetes ([place]) - Well-controlled continue lisinopril, BMP up-to-date  Diabetes mellitus type 2 in obese ([facility]) - Continue metformin, restart glipizide as A1c not controlled today, will send message about Trulicity she previously got this through [employer] cares program - Discussed signs and symptoms of hypoglycemia to watch for, check blood sugar log, consider uptitrating glyburide pending blood sugars  Yeast infection - Declined STD testing, sent fluconazole to pharmacy, discussed if symptoms persist will need vaginal exam  Right knee pain - Maneuvers negative, no effusion noted, suspect potential osteoarthritis, discussed x-ray versus ultrasound and sided on referral to sports med for ultrasound as I have low suspicion for bony injury due to the chronicity of this injury - Sports med appointment scheduled for next week, given date and time location of appointment     Lenoria Chime, MD [place]

## 2022-05-16 ENCOUNTER — Encounter: Payer: Self-pay | Admitting: Family Medicine

## 2022-05-16 ENCOUNTER — Ambulatory Visit (INDEPENDENT_AMBULATORY_CARE_PROVIDER_SITE_OTHER): Payer: Self-pay | Admitting: Family Medicine

## 2022-05-16 VITALS — BP 106/62 | HR 66 | Wt 225.0 lb

## 2022-05-16 DIAGNOSIS — E669 Obesity, unspecified: Secondary | ICD-10-CM

## 2022-05-16 DIAGNOSIS — B379 Candidiasis, unspecified: Secondary | ICD-10-CM | POA: Insufficient documentation

## 2022-05-16 DIAGNOSIS — L84 Corns and callosities: Secondary | ICD-10-CM

## 2022-05-16 DIAGNOSIS — M25561 Pain in right knee: Secondary | ICD-10-CM

## 2022-05-16 DIAGNOSIS — I152 Hypertension secondary to endocrine disorders: Secondary | ICD-10-CM

## 2022-05-16 DIAGNOSIS — E1159 Type 2 diabetes mellitus with other circulatory complications: Secondary | ICD-10-CM

## 2022-05-16 DIAGNOSIS — E1169 Type 2 diabetes mellitus with other specified complication: Secondary | ICD-10-CM

## 2022-05-16 LAB — POCT GLYCOSYLATED HEMOGLOBIN (HGB A1C): HbA1c, POC (controlled diabetic range): 11.1 % — AB (ref 0.0–7.0)

## 2022-05-16 MED ORDER — GLYBURIDE 1.25 MG PO TABS: 1.2500 mg | ORAL_TABLET | Freq: Every day | ORAL | 1 refills | Status: DC

## 2022-05-16 MED ORDER — FLUCONAZOLE 150 MG PO TABS: 150.0000 mg | ORAL_TABLET | Freq: Once | ORAL | 1 refills | Status: AC

## 2022-05-16 NOTE — Assessment & Plan Note (Signed)
Resolved-continue as needed salicylic acid, no signs of wound or skin breakdown

## 2022-05-16 NOTE — Patient Instructions (Addendum)
It was wonderful to see you today.  Please bring ALL of your medications with you to every visit.   Today we talked about:  - Today your Hgb A1c was 11.1, lets restart the glyburide, watch for symptoms of low blood sugar, I will send a message to the pharmacy to get you the Trulicity injections again - I sent fluconazole for your yeast infection, take 1 tab on day one and 1 tab on day 3 - I scheduled you an appointment for your knee pain at [facility] on May 23, 2022 at 3:30 pm. They are located at [address], [place], [place] [address]. Their number to call is [phone]  Please schedule a follow up in 3 months.    Thank you for choosing [facility].   Please call [phone] with any questions about today's appointment.  Please be sure to schedule follow up at the front  desk before you leave today.   Please arrive at least 15 minutes prior to your scheduled appointments.   If you had blood work today, I will send you a MyChart message or a letter if results are normal. Otherwise, I will give you a call.   If you had a referral placed, they will call you to set up an appointment. Please give [place] a call if you don't hear back in the next 2 weeks.   If you need additional refills before your next appointment, please call your pharmacy first.   Fue maravilloso verte hoy.  Por favor traiga TODOS sus medicamentos a cada visita.  Hoy hablamos de:  Anson Crofts su Hgb A1c fue 11.1, reiniciemos la gliburida, est atento a los sntomas de bajo nivel de azcar en la sangre, enviar un mensaje a la farmacia para que le administren las inyecciones de Trulicity nuevamente. - Le envi fluconazol para su candidiasis, tome 1 pestaa el da uno y 1 pestaa el da 3 - Le program una cita para su dolor de rodilla en [facility] el 24 de julio de 2023 a las 3:30 p. m. Estn ubicados en [place], [place],  [address]. Su nmero para llamar es [phone]  Programe un seguimiento en 3 meses.   Clayburn Pert por elegir Medicina familiar [place].  Llame al [phone] si tiene alguna pregunta sobre la cita de [place].  Asegrese de programar un seguimiento en la recepcin antes de irse hoy.  Llegue al menos 15 minutos antes de sus citas programadas.  Si le hicieron un anlisis de [employer], Audiological scientist un mensaje de MyChart o una carta si los [place] son normales. De lo contrario, te llamar.  Si le colocaron una referencia, lo llamarn para programar una cita. Llmanos si no recibes respuesta en las prximas 2 semanas.  Si necesita recargas adicionales antes de su prxima cita, primero llame a su farmacia.  Yehuda Savannah, MD  Family Medicine

## 2022-05-16 NOTE — Assessment & Plan Note (Signed)
-   Maneuvers negative, no effusion noted, suspect potential osteoarthritis, discussed x-ray versus ultrasound and sided on referral to sports med for ultrasound as I have low suspicion for bony injury due to the chronicity of this injury - Sports med appointment scheduled for next week, given date and time location of appointment

## 2022-05-16 NOTE — Assessment & Plan Note (Signed)
-   Well-controlled continue lisinopril, BMP up-to-date

## 2022-05-16 NOTE — Assessment & Plan Note (Signed)
-   Continue metformin, restart glipizide as A1c not controlled today, will send message about Trulicity she previously got this through [facility] - Discussed signs and symptoms of hypoglycemia to watch for, check blood sugar log, consider uptitrating glyburide pending blood sugars

## 2022-05-16 NOTE — Assessment & Plan Note (Signed)
-   Declined STD testing, sent fluconazole to pharmacy, discussed if symptoms persist will need vaginal exam

## 2022-05-23 ENCOUNTER — Other Ambulatory Visit: Payer: Self-pay | Admitting: Family Medicine

## 2022-05-25 ENCOUNTER — Telehealth: Payer: Self-pay

## 2022-05-25 NOTE — Telephone Encounter (Signed)
Spoke with [place] again.   Rep got in touch with pharmacist Ralph Leyden) about previous issue with shipment. Pharmacist seen all previous calls from me & notes. She reached out to mgmt again about this issue and let them know I called to f/u as told. No outcome has been reached yet but Donita Brooks will be calling me back once decision is made about shipment (has been escalated on their end). Rep told me to f/u Friday 05/27/22 if I do not receive a call back.

## 2022-05-25 NOTE — Telephone Encounter (Signed)
Spoke to pt with interpreter.  Pt never rec'd renewal application for Trulicity back in May. Will mail again to her home.   Informed patient I am still working on getting her one last refill in on this medication. We call her once rec'd.  (The company that ships this medication had a mixup when they went under a new name [[employer] to [employer]. Last refill I requested verbally before enrollment expiration was never shipped. Been trying to speak to a supervisor twice about this issue this week and still unsuccessful).

## 2022-06-28 NOTE — Telephone Encounter (Signed)
Submitted RENEWAL application for TRULICITY '3MG'$ /0.5ML to [place] for patient assistance.   Phone: [phone]

## 2022-07-11 NOTE — Telephone Encounter (Signed)
Received notification from [place] [place] regarding approval for TRULICITY '3MG'$ /0.5ML. Patient assistance approved from 06/28/22 to 06/29/23.  MEDICATION WILL SHIP TO PT'S HOME  Phone: [phone]

## 2022-08-25 ENCOUNTER — Telehealth: Payer: Self-pay

## 2022-08-25 NOTE — Telephone Encounter (Signed)
Patients daughter calls nurse line reporting a tooth ache.   She reports symptoms have been present for a few days now. No fevers or swelling. Patient does not have a dentist.   Of note, patient tested positive for covid 1 week ago. Her symptoms started 1.5 weeks ago. Only symptom remaining is a runny nose and some sinus pressure.  Patient scheduled for tomorrow morning for evaluation.

## 2022-08-26 ENCOUNTER — Ambulatory Visit (INDEPENDENT_AMBULATORY_CARE_PROVIDER_SITE_OTHER): Payer: Self-pay | Admitting: Family Medicine

## 2022-08-26 VITALS — BP 138/88 | HR 75 | Ht 65.0 in | Wt 228.0 lb

## 2022-08-26 DIAGNOSIS — K0889 Other specified disorders of teeth and supporting structures: Secondary | ICD-10-CM

## 2022-08-26 MED ORDER — AMOXICILLIN 500 MG PO TABS: 500.0000 mg | ORAL_TABLET | Freq: Two times a day (BID) | ORAL | 0 refills | Status: AC

## 2022-08-26 NOTE — Progress Notes (Signed)
    SUBJECTIVE:   CHIEF COMPLAINT / HPI:   Recent covid infection - Gnerally feeling well - Has cough and some mild headaches - Tired for the last several weeks   Tooth pain - Started about 4 days - Subjective fevers (suddenly has chills and sweating) in the last 2 weeks since COVID - Previously chipped molar tooth, which is where it now hurts - Minimal swelling of the area - More sensitive to cold temperatures  - Has a clinic she goes to for her previous tooth issues, has been told before by the dentist that he won't extract if her sugars are not under control  PERTINENT  PMH / PSH: Reviewed  OBJECTIVE:   BP 138/88   Pulse 75   Ht '5\' 5"'$  (1.651 m)   Wt 228 lb (103.4 kg)   SpO2 95%   BMI 37.94 kg/m   General: NAD, well-appearing, well-nourished Respiratory: No respiratory distress, breathing comfortably, able to speak in full sentences Skin: warm and dry, no rashes noted on exposed skin Psych: Appropriate affect and mood HEENT: poor dentition, mild swelling around the top right molar with tenderness to palpation. No obvious abscess present. Tms clear bilaterally, throat clear, mildly TTP of sinuses  ASSESSMENT/PLAN:   Tooth ache Some mild swelling and tenderness with significantly poor dentition. Given the symptoms and appearance, will treat with antibiotics for short course and recommend following up with dentistry and PCP.  - Amoxicillin '500mg'$  BID x5 days - Dentistry follow-up - Return/ER precautions given  Recent covid  Extensive education regarding diagnosis of COVID and that patient has been symptomatic for 1.5 weeks and is now out of the infectious window.    Meredith Maynard, [place]

## 2022-08-26 NOTE — Patient Instructions (Addendum)
You can use Tylenol and Ibuprofen as needed for the pain in your tooth. I have sent in a twice daily antibiotic for your infection. Please follow up with your PCP and dentist.   You will need to see a dentist regarding continued pain. Please make sure to be evaluated if you are begin to have swelling, fevers, and worsening pain in the area and are unable to get in with a dentist.

## 2022-10-17 ENCOUNTER — Encounter: Payer: Self-pay | Admitting: Family Medicine

## 2022-10-17 ENCOUNTER — Ambulatory Visit (INDEPENDENT_AMBULATORY_CARE_PROVIDER_SITE_OTHER): Payer: Self-pay | Admitting: Family Medicine

## 2022-10-17 VITALS — BP 131/79 | HR 77 | Ht 61.0 in | Wt 234.2 lb

## 2022-10-17 DIAGNOSIS — I152 Hypertension secondary to endocrine disorders: Secondary | ICD-10-CM

## 2022-10-17 DIAGNOSIS — E669 Obesity, unspecified: Secondary | ICD-10-CM

## 2022-10-17 DIAGNOSIS — D509 Iron deficiency anemia, unspecified: Secondary | ICD-10-CM

## 2022-10-17 DIAGNOSIS — W19XXXS Unspecified fall, sequela: Secondary | ICD-10-CM

## 2022-10-17 DIAGNOSIS — Z1321 Encounter for screening for nutritional disorder: Secondary | ICD-10-CM

## 2022-10-17 DIAGNOSIS — E1159 Type 2 diabetes mellitus with other circulatory complications: Secondary | ICD-10-CM

## 2022-10-17 DIAGNOSIS — Z862 Personal history of diseases of the blood and blood-forming organs and certain disorders involving the immune mechanism: Secondary | ICD-10-CM

## 2022-10-17 DIAGNOSIS — E1169 Type 2 diabetes mellitus with other specified complication: Secondary | ICD-10-CM

## 2022-10-17 LAB — POCT GLYCOSYLATED HEMOGLOBIN (HGB A1C): HbA1c, POC (controlled diabetic range): 9.1 % — AB (ref 0.0–7.0)

## 2022-10-17 MED ORDER — GLYBURIDE 1.25 MG PO TABS: 1.2500 mg | ORAL_TABLET | Freq: Every day | ORAL | 1 refills | Status: DC

## 2022-10-17 MED ORDER — EMPAGLIFLOZIN 10 MG PO TABS: 10.0000 mg | ORAL_TABLET | Freq: Every day | ORAL | 3 refills | Status: DC

## 2022-10-17 MED ORDER — FLUCONAZOLE 150 MG PO TABS: 150.0000 mg | ORAL_TABLET | Freq: Once | ORAL | 0 refills | Status: AC

## 2022-10-17 MED ORDER — METFORMIN HCL 1000 MG PO TABS: 1000.0000 mg | ORAL_TABLET | Freq: Two times a day (BID) | ORAL | 3 refills | Status: DC

## 2022-10-17 MED ORDER — FERROUS SULFATE 325 (65 FE) MG PO TABS: 325.0000 mg | ORAL_TABLET | ORAL | 2 refills | Status: DC

## 2022-10-17 MED ORDER — TRULICITY 3 MG/0.5ML ~~LOC~~ SOAJ: 3.0000 mg | SUBCUTANEOUS | 0 refills | Status: DC

## 2022-10-17 MED ORDER — GABAPENTIN 300 MG PO CAPS: 300.0000 mg | ORAL_CAPSULE | Freq: Three times a day (TID) | ORAL | 0 refills | Status: DC

## 2022-10-17 MED ORDER — LISINOPRIL 20 MG PO TABS: 20.0000 mg | ORAL_TABLET | Freq: Every day | ORAL | 3 refills | Status: DC

## 2022-10-17 NOTE — Patient Instructions (Addendum)
Fue maravilloso verte hoy.  Por favor traiga TODOS sus medicamentos a cada visita.  Hoy hablamos de:  Llamara para programar su mamografa.  Alterne Tylenol y Advil segn sea necesario cada 6 horas para Conservation officer, historic buildings. Puede utilizar almohadillas trmicas segn sea necesario. Puede comprar parches de lidocana de venta libre segn sea necesario para Best boy.  Regrese si tiene nuseas, vmitos, empeoramiento del dolor, problemas de incontinencia vesical o intestinal.  Hoy estamos haciendo anlisis de laboratorio para controlar su colesterol, nivel en sangre y nivel de vitamina D. Le enviar un mensaje de MyChart si tiene MyChart. De lo contrario, lo llamar si hay resultados anormales o le enviar una carta si todo volvi a la normalidad. Si no tiene noticias mas en 2 semanas, llame a la oficina.  Gracias por venir a su visita segn lo programado. ltimamente hemos tenido un gran problema de "ausencias" y esto limita significativamente nuestra capacidad para atender y Clinical biochemist a los pacientes. Como recordatorio amistoso: si no puede asistir a su cita, llame para cancelarla. Tenemos una poltica de no presentacin para aquellos que no cancelan dentro de las 24 horas. Nuestra poltica es que si falta o no cancela una cita dentro de las 24 horas, 3 veces en un perodo de 6 meses, es posible que lo despidan de [place].  Clayburn Pert por elegir Medicina familiar [place].  Llame al [phone] si tiene alguna pregunta sobre la cita de [place].  Asegrese de programar un seguimiento en la recepcin antes de irse hoy.  Sharion Settler, D.O. PGY-3 Medicina Familiar

## 2022-10-17 NOTE — Progress Notes (Signed)
SUBJECTIVE:   CHIEF COMPLAINT / HPI:   Meredith Maynard is a 56 y.o. female who presents to the [facility] clinic today to discuss the following concerns:   Left breast pain and abdominal pain s/p fall She tripped over a cable 3 weeks ago. She was covered up in a blanket so she states she could not break her fall with her arms. She scraped up her right elbow.  She notes that the day following her fall she had pain in her abdomen and across her chest. She has been drinking herbal tea and taking Advil as needed for her pain which has helped some. She is concerned because she continues to have pain around the outside of her left breast. She rates her pain as a 3/10. She has intermittent central abdominal pain around her previous scar.  Last Mammogram was 01/2020, she is due for repeat. She states she is working with a company to get a free mammogram, she plans to call them tomorrow to arrange an appointment.   T2DM  Last A1c 11.1.  Taking metformin 1000 mg twice daily, glyburide 1.25 mg daily. Taking 20 mg of Lisinopril. Not on statin but is on zetia. Eye exam is not UTD, she is uninsured and is trying to get insurance.  HM  Overdue for colon cancer screening. She would like to wait to do this when she obtains insurance.   PERTINENT  PMH / PSH: GERD, hypertension, type 2 diabetes, BPPV, chronic dizziness. Hx C/S, umbilical hernia repair   OBJECTIVE:   BP 131/79   Pulse 77   Ht '5\' 1"'$  (1.549 m)   Wt 234 lb 3.2 oz (106.2 kg)   LMP  (LMP Unknown)   SpO2 100%   BMI 44.25 kg/m    General: NAD, pleasant, able to participate in exam Cardiac: RRR, no murmurs. Respiratory: CTAB, normal effort, No wheezes, rales or rhonchi Breast: L breast without any bruising or swelling. No lumps/masses palpated.  Abdomen: Bowel sounds present, nontender in all quadrants, obese, central vertical abdominal scar present, suprapubic scar present, nondistended, no ecchymosis or deformities noted Skin:  small healing abrasion noted to right elbow  Psych: Normal affect and mood  Breast exam chaperoned by Lavell Anchors, CMA   ASSESSMENT/PLAN:   1. Diabetes mellitus type 2 in obese (HCC) A1c 9.1 which is improved from previous of 11.1. She is not on statin but would qualify for at least moderate dose statin given diabetes, hypertension. Last LDL 131  in 08/2021. Will await lipid panel and discuss with patient prior to starting new medication. Given that she is not yet at goal, and given hx of HTN discussed benefits of starting SGLT-2. Patient was hesitant at first but ultimately amenable. Discussed holding for sick days.  - HgB A1c - Lipid Panel - Dulaglutide (TRULICITY) 3 WL/7.9GX SOPN; Inject 3 mg as directed once a week.  Dispense: 2 mL; Refill: 0 - glyBURIDE (DIABETA) 1.25 MG tablet; Take 1 tablet (1.25 mg total) by mouth daily with breakfast.  Dispense: 90 tablet; Refill: 1 - lisinopril (ZESTRIL) 20 MG tablet; Take 1 tablet (20 mg total) by mouth daily.  Dispense: 90 tablet; Refill: 3 - metFORMIN (GLUCOPHAGE) 1000 MG tablet; Take 1 tablet (1,000 mg total) by mouth 2 (two) times daily with a meal.  Dispense: 180 tablet; Refill: 3 - empagliflozin (JARDIANCE) 10 MG TABS tablet; Take 1 tablet (10 mg total) by mouth daily.  Dispense: 90 tablet; Refill: 3 -Ophthalmologic eye exam once has insurance  2. Hypertension associated with diabetes ([place]) At goal today. BMP is UTD.  - lisinopril (ZESTRIL) 20 MG tablet; Take 1 tablet (20 mg total) by mouth daily.  Dispense: 90 tablet; Refill: 3  3. History of iron deficiency anemia Last CBC in 08/2021 with Hgb of 11.  Her anemia was thought to be related to menses.  She has not had a period in 1 year, will check to ensure Hgb is stable. She is due for colon cancer screening but would like to hold off until she has insurance. - ferrous sulfate 325 (65 FE) MG tablet; Take 1 tablet (325 mg total) by mouth every other day.  Dispense: 30 tablet; Refill: 2 -  CBC -Ferritin -Defers colon cancer screening until she has insurance  4. Encounter for vitamin deficiency screening Patient is interested in vitamin D screening. - Vitamin D, 25-hydroxy  5. Accidental fall, sequela Continued soreness to area around abdominal scar and around left breast. Exam today was reassuring and unremarkable. Recommended that she proceed with her screening mammogram because she is overdue.  Otherwise, there is no need for additional imaging at this time.  Continue conservative care.  -Alternate Tylenol and Advil as needed for pain -Recommend heating pads, lidocaine patches as needed -Return if worsening or she develops systemic symptoms    Sharion Settler, [place]

## 2022-10-18 LAB — VITAMIN D 25 HYDROXY (VIT D DEFICIENCY, FRACTURES): Vit D, 25-Hydroxy: 8.3 ng/mL — ABNORMAL LOW (ref 30.0–100.0)

## 2022-10-18 LAB — CBC
Hematocrit: 43.2 % (ref 34.0–46.6)
Hemoglobin: 14 g/dL (ref 11.1–15.9)
MCH: 28.3 pg (ref 26.6–33.0)
MCHC: 32.4 g/dL (ref 31.5–35.7)
MCV: 87 fL (ref 79–97)
Platelets: 156 10*3/uL (ref 150–450)
RBC: 4.94 x10E6/uL (ref 3.77–5.28)
RDW: 13.5 % (ref 11.7–15.4)
WBC: 6.7 10*3/uL (ref 3.4–10.8)

## 2022-10-18 LAB — LIPID PANEL
Chol/HDL Ratio: 3.8 ratio (ref 0.0–4.4)
Cholesterol, Total: 172 mg/dL (ref 100–199)
HDL: 45 mg/dL (ref >39)
LDL Chol Calc (NIH): 85 mg/dL (ref 0–99)
Triglycerides: 252 mg/dL — ABNORMAL HIGH (ref 0–149)
VLDL Cholesterol Cal: 42 mg/dL — ABNORMAL HIGH (ref 5–40)

## 2022-10-19 ENCOUNTER — Other Ambulatory Visit: Payer: Self-pay | Admitting: Family Medicine

## 2022-10-19 MED ORDER — VITAMIN D (ERGOCALCIFEROL) 1.25 MG (50000 UNIT) PO CAPS: 50000.0000 [IU] | ORAL_CAPSULE | ORAL | 2 refills | Status: DC

## 2022-10-26 ENCOUNTER — Telehealth: Payer: Self-pay

## 2022-10-26 ENCOUNTER — Other Ambulatory Visit (HOSPITAL_COMMUNITY): Payer: Self-pay

## 2022-10-26 NOTE — Telephone Encounter (Signed)
Mailed re-enrollment application for Jardiance ([employer]) to pt's home.

## 2022-12-02 ENCOUNTER — Encounter: Payer: Self-pay | Admitting: Family Medicine

## 2022-12-02 ENCOUNTER — Ambulatory Visit (INDEPENDENT_AMBULATORY_CARE_PROVIDER_SITE_OTHER): Payer: Self-pay | Admitting: Family Medicine

## 2022-12-02 VITALS — BP 100/70 | HR 74 | Ht 61.0 in | Wt 234.0 lb

## 2022-12-02 DIAGNOSIS — E669 Obesity, unspecified: Secondary | ICD-10-CM

## 2022-12-02 DIAGNOSIS — S6990XA Unspecified injury of unspecified wrist, hand and finger(s), initial encounter: Secondary | ICD-10-CM | POA: Insufficient documentation

## 2022-12-02 DIAGNOSIS — E1169 Type 2 diabetes mellitus with other specified complication: Secondary | ICD-10-CM

## 2022-12-02 DIAGNOSIS — E559 Vitamin D deficiency, unspecified: Secondary | ICD-10-CM

## 2022-12-02 MED ORDER — VITAMIN D (ERGOCALCIFEROL) 1.25 MG (50000 UNIT) PO CAPS: 50000.0000 [IU] | ORAL_CAPSULE | ORAL | 0 refills | Status: DC

## 2022-12-02 NOTE — Assessment & Plan Note (Addendum)
Due for A1c in 2 months, sent message to pharmacy team to help with Trulicity refills Recommend f/u appt in 2 months

## 2022-12-02 NOTE — Assessment & Plan Note (Signed)
Printed script for 50K weekly x 8 weeks, given to patient

## 2022-12-02 NOTE — Progress Notes (Signed)
    SUBJECTIVE:   CHIEF COMPLAINT / HPI:   R middle finger- slammed in door 2 weeks ago, was at work. Told boss she was going to her doctor and looking for medical advice. Has a spot on top of the finger that is getting bigger. Can bend it but it is painful.  T2DM- last A1c in Dec was 9.1. current medications include Trulicity '3mg'$  once weekly (but has been out for 2 weeks), metformin '1000mg'$  twice daily, and glyburide 1.25 mg daily with breakfast. Not taking Jardiance and requests to have it off of her med list. Needs refill of Trulicity.  PERTINENT  PMH / PSH: T2DM, HTN  OBJECTIVE:   BP 100/70   Pulse 74   Ht '5\' 1"'$  (1.549 m)   Wt 234 lb (106.1 kg)   SpO2 98%   BMI 44.21 kg/m   General: alert & oriented, no apparent distress, well groomed HEENT: normocephalic, atraumatic, EOM grossly intact, oral mucosa moist, neck supple Respiratory: normal respiratory effort GI: non-distended Skin: no rashes, no jaundice MSK: on right middle finger, 1 cm oval nodule on dorsal surface that is soft, no mass, no skin breakdown, able to flex DIP on right middle finger, no erythema of finger Psych: appropriate mood and affect   ASSESSMENT/PLAN:   Diabetes mellitus type 2 in obese (HCC) Due for A1c in 2 months, sent message to pharmacy team to help with Trulicity refills Recommend f/u appt in 2 months  Finger injury, initial encounter No open wound, discussed would recommend X-ray Letter written for employer, she will check with employer as this may be workmans comp case as it happened at work If no fracture on X-ray, suspect soft tissue swelling will go down with time  Vitamin D deficiency Printed script for 50K weekly x 8 weeks, given to patient     Lenoria Chime, MD [place]

## 2022-12-02 NOTE — Assessment & Plan Note (Signed)
No open wound, discussed would recommend X-ray Letter written for employer, she will check with employer as this may be workmans comp case as it happened at work If no fracture on X-ray, suspect soft tissue swelling will go down with time

## 2022-12-02 NOTE — Patient Instructions (Addendum)
It was wonderful to see you today.  Please bring ALL of your medications with you to every visit.   Today we talked about:  Message sent to get Trulicity refills.  I have ordered an X-ray for you to get of your finger, you can go to [facility] En[facility] to get this done.  Thank you for choosing [facility].   Please call [phone] with any questions about today's appointment.  Please arrive at least 15 minutes prior to your scheduled appointments.   If you had blood work today, I will send you a MyChart message or a letter if results are normal. Otherwise, I will give you a call.   If you had a referral placed, they will call you to set up an appointment. Please give [place] a call if you don't hear back in the next 2 weeks.   If you need additional refills before your next appointment, please call your pharmacy first.   Yehuda Savannah, MD  Family Medicine

## 2022-12-07 ENCOUNTER — Ambulatory Visit (HOSPITAL_COMMUNITY)
Admission: RE | Admit: 2022-12-07 | Discharge: 2022-12-07 | Disposition: A | Discharge status: Home / Self Care | Payer: Self-pay | Source: Ambulatory Visit | Attending: Family Medicine | Admitting: Family Medicine

## 2022-12-07 DIAGNOSIS — S6990XA Unspecified injury of unspecified wrist, hand and finger(s), initial encounter: Secondary | ICD-10-CM | POA: Insufficient documentation

## 2022-12-19 NOTE — Telephone Encounter (Signed)
Open in error

## 2023-01-06 ENCOUNTER — Ambulatory Visit (INDEPENDENT_AMBULATORY_CARE_PROVIDER_SITE_OTHER): Payer: Self-pay | Admitting: Family Medicine

## 2023-01-06 ENCOUNTER — Encounter: Payer: Self-pay | Admitting: Family Medicine

## 2023-01-06 ENCOUNTER — Ambulatory Visit: Payer: Self-pay | Admitting: Family Medicine

## 2023-01-06 VITALS — BP 135/71 | HR 71 | Ht 61.0 in | Wt 232.0 lb

## 2023-01-06 DIAGNOSIS — S0990XA Unspecified injury of head, initial encounter: Secondary | ICD-10-CM

## 2023-01-06 NOTE — Progress Notes (Unsigned)
    SUBJECTIVE:   CHIEF COMPLAINT / HPI:   Head Injury Trunk of her car fell and hit the top of her head Occurred ~2 weeks ago No LOC  Pain on top of head but it resolved after a few minutes Now having some headaches over the past 1 week Feels like a pressure on her head Comes and goes Advil helps somewhat No nausea, vomiting, dizziness, vision changes, or weakness No difficulty concentrating, no mood changes Does endorse feeling more fatigued and sluggish at work Works doing housekeeping   PERTINENT  PMH / PSH: T2DM, HTN, BPPV  OBJECTIVE:   BP 135/71   Pulse 71   Ht 5\' 1"  (1.549 m)   Wt 232 lb (105.2 kg)   LMP  (LMP Unknown)   SpO2 99%   BMI 43.84 kg/m   Gen: NAD, pleasant, able to participate in exam HEENT: [place]/AT, PERRLA, EOMI, nares patent bilaterally, TM normal bilaterally, oropharynx unremarkable Neck: supple, no tenderness to palpation, FROM, no cervical or supraclavicular lymphadenopathy CV: RRR, normal S1/S2, no murmur Resp: Normal effort, lungs CTAB Skin: warm and dry, no rashes noted Neuro: alert, CN II-XII intact, sensation intact to light touch throughout, 5/5 strength in all extremities, normal finger-to-nose, normal gait Psych: Normal affect and mood   ASSESSMENT/PLAN:   Head injury Occurred 2 weeks ago, trunk fell on head. No LOC, vomiting, neuro changes, or other red flags. Full neuro exam is normal. Presentation consistent with concussion. Discussed expected illness course, realistic time frame for healing, and importance of avoiding additional injury. Continue daily activities using symptoms as a guide. While patient does take ASA, I have very low suspicion for intracranial pathology at this time. Return precautions reviewed. Follow up in 4 weeks to ensure improvement.   Alcus Dad, MD [place]

## 2023-01-06 NOTE — Patient Instructions (Signed)
Conmocin cerebral en los adultos Concussion, Adult  Ardelia Mems conmocin cerebral es una lesin en el cerebro a causa de un golpe fuerte y directo (traumatismo) en la cabeza o el cuerpo. Este golpe directo hace que el cerebro se sacuda rpidamente hacia atrs y Jordan adelante dentro del crneo. Esto puede daar las clulas cerebrales y causar cambios qumicos en el cerebro. Una conmocin cerebral tambin puede conocerse como traumatismo craneoenceflico (TCE) leve. Los efectos de una conmocin cerebral pueden ser graves. Si usted sufre una conmocin cerebral, debe tener mucho cuidado para evitar sufrir una segunda conmocin cerebral. Cules son las causas? Las causas de esta afeccin son: Un golpe directo en la cabeza. Un movimiento repentino del cuerpo que hace que el cerebro se mueva hacia atrs y Jordan adelante dentro del crneo, como en un choque de automvil. Cules son los signos o sntomas? Los signos de una conmocin cerebral pueden ser difciles de notar. En un primer momento, los pacientes, familiares y profesionales tal vez no los adviertan. Puede ser que usted se vea bien por fuera, pero que acte o se sienta diferente. Cada lesin en la cabeza es diferente. Por lo general, los sntomas son temporales, pero pueden durar algunos das, semanas o incluso meses. Algunos sntomas aparecen de inmediato; sin embargo, otros quizs no se Quarry manager. Sntomas fsicos [place] de [place]. Mareos y problemas de coordinacin o equilibrio. Sensibilidad a la luz o los ruidos. Nuseas o vmitos. Cansancio (fatiga). Problemas de visin o audicin. Convulsiones. Sntomas mentales y emocionales Irritabilidad o cambios de humor. Problemas de memoria. Dificultad para concentrarse, organizarse o tomar decisiones. Cambios en los hbitos de alimentacin o en el sueo. Lentitud para pensar, actuar o reaccionar, hablar o leer. Ansiedad o depresin. Cmo se diagnostica? Esta  afeccin se diagnostica en funcin de los sntomas y de la lesin. Tambin pueden hacerle pruebas, que incluyen las siguientes: Pruebas de diagnstico por imgenes, como una exploracin por tomografa computarizada (TC) o una resonancia magntica (RM). Pruebas neuropsicolgicas. Con ellas, se examinan el pensamiento, la comprensin, el aprendizaje y Sales promotion account executive. Cmo se trata? El tratamiento de esta afeccin incluye lo siguiente: Detener la prctica de deportes o actividades si est lesionado. Reposo fsico y mental y observacin atenta, por lo general, en el hogar. Medicamentos para ayudar con los sntomas tales como dolor de cabeza, [place] para dormir. Derivacin a una clnica o centro de rehabilitacin que se especialice en conmociones cerebrales. Siga estas instrucciones en su casa: Actividad Limite las actividades que requieren mucha atencin o Estate manager/land agent, como las siguientes: Trabajos escolares o trabajos relacionados con el empleo. Mirar televisin. Usar la computadora o el telfono. Jugar juegos de memoria y armar rompecabezas. El reposo favorece la curacin del cerebro. Asegrese de hacer lo siguiente: Duerma lo suficiente. La mayora de los adultos debera dormir entre 7 y 16 horas todas las noches. [place]. Tome siestas o haga pausas cuando se sienta cansado. Evite el ejercicio de alta intensidad y las actividades fsicas que exijan mucho esfuerzo. Interrumpa cualquier [employer] sntomas. El mdico puede recomendarle ejercicios ligeros, Administrator, sports. No realice actividades de alto riesgo que pudieran provocar una segunda conmocin cerebral, como andar en bicicleta o practicar deportes. Pregntele al mdico cundo puede retomar sus actividades normales, como la [place], [place], los deportes y Forensic psychologist. La capacidad para reaccionar puede ser ms lenta luego de una lesin cerebral. Nunca realice estas actividades si se siente  mareado. Instrucciones generales  Use los medicamentos de venta libre y los recetados solamente como se lo haya indicado el mdico. Algunos medicamentos, como los anticoagulantes y la aspirina, pueden aumentar el riesgo de sufrir complicaciones, como hemorragias. Evite tomar analgsicos opioides mientras se recupera de una conmocin cerebral. No beba alcohol hasta que el mdico se lo autorice. Beber alcohol puede demorar su recuperacin y ponerlo en riesgo de nuevas lesiones. Controle sus sntomas y pdales a otras personas cercanas que tambin lo hagan. En algunos casos pueden aparecer complicaciones luego de una conmocin cerebral. Informe a su jefe en el trabajo, los maestros, el departamento de enfermera de la escuela, el consejero escolar o Industrial/product designer deportivo acerca de su lesin, los sntomas y las restricciones que tiene. Consulte a un terapeuta de salud mental si se siente ansioso o deprimido. Controlar esta afeccin puede ser un desafo. Concurra a [place]. El mdico ver cmo va su recuperacin y le dar un plan para volver a sus actividades. Cmo se previene? Es muy importante que evite otra lesin cerebral. En casos poco frecuentes, una nueva lesin puede causar daos cerebrales permanentes, hinchazn del cerebro o la muerte. El riesgo es mayor durante los primeros 7 a 10 das despus de una lesin en la cabeza. Evite las lesiones: [place] actividades que podran causar una segunda conmocin cerebral, como deportes de contacto o recreativos, hasta que su mdico lo autorice. Tome estas medidas una vez que haya regresado a la prctica de deportes o actividades: Evite las jugadas o movimientos que puedan ocasionar que choque contra otra persona. Esta es la forma en que ocurre la mayora de las conmociones cerebrales. Siga las reglas y respete a los dems jugadores. No se involucre en jugadas violentas o ilegales. Haga ejercicio con regularidad que incluya  entrenamiento de fuerza y equilibrio. Use un casco que le calce bien durante la prctica de deportes, al andar en bicicleta o al realizar [employer]. Los cascos pueden ayudar a protegerlo de las lesiones graves en el crneo y el cerebro, pero posiblemente no lo protejan de una conmocin cerebral. Incluso al usar casco, debe tratar de no recibir un golpe en la cabeza. Dnde obtener ms informacin Centers for Disease Control and Prevention (Centros para el Control y la Prevencin de Arboriculturist): StoreMirror.com.cy Comunquese con un mdico si: Los sntomas no mejoran o empeoran. Aparecen nuevos sntomas. Sufre una nueva lesin. Su coordinacin empeora. Tiene cambios de conducta inusuales. Solicite ayuda de inmediato si: Tiene dolor de cabeza intenso o que empeora. Tiene debilidad o adormecimiento en cualquier parte del cuerpo, dificultad para articular palabras, cambios en la visin o confusin. Vomita repetidas veces. Pierde la conciencia, tiene ms sueo de lo normal o tiene dificultad para despertarse. Tiene una convulsin. Estos sntomas pueden Sales executive. Solicite ayuda de inmediato. Llame al 911. No espere a ver si los sntomas desaparecen. No conduzca por sus propios medios [employer]. Solicite ayuda de inmediato tambin si: Piensa acerca de lastimarse o lastimar a Producer, television/film/video. Siga alguno de estos pasos si siente que puede lastimarse a s mismo o a Producer, television/film/video, o tiene pensamientos de poner fin a su vida: Dirjase al centro de urgencias ms cercano. Llame al 911. Llame a National Suicide Prevention Lifeline ([place] para la Prevencin del Suicidio) al 3150655356 o al 988. Est disponible las 24 horas del da. Enve un mensaje de texto a la lnea para casos de crisis al [phone]. Esta informacin no tiene Marine scientist el consejo  del mdico. Asegrese de hacerle al mdico cualquier pregunta que tenga. Document Revised: 03/30/2022  Document Reviewed: 03/30/2022 Elsevier Patient Education  [place].

## 2023-01-07 DIAGNOSIS — S0990XA Unspecified injury of head, initial encounter: Secondary | ICD-10-CM | POA: Insufficient documentation

## 2023-01-07 NOTE — Assessment & Plan Note (Signed)
Occurred 2 weeks ago, trunk fell on head. No LOC, vomiting, neuro changes, or other red flags. Full neuro exam is normal. Presentation consistent with concussion. Discussed expected illness course, realistic time frame for healing, and importance of avoiding additional injury. Continue daily activities using symptoms as a guide. While patient does take ASA, I have very low suspicion for intracranial pathology at this time. Return precautions reviewed. Follow up in 4 weeks to ensure improvement.

## 2023-01-30 ENCOUNTER — Encounter: Payer: Self-pay | Admitting: Family Medicine

## 2023-01-30 ENCOUNTER — Ambulatory Visit (INDEPENDENT_AMBULATORY_CARE_PROVIDER_SITE_OTHER): Payer: Self-pay | Admitting: Family Medicine

## 2023-01-30 VITALS — BP 132/70 | HR 66 | Temp 97.9°F | Ht 61.0 in | Wt 230.6 lb

## 2023-01-30 DIAGNOSIS — E1369 Other specified diabetes mellitus with other specified complication: Secondary | ICD-10-CM

## 2023-01-30 DIAGNOSIS — E7800 Pure hypercholesterolemia, unspecified: Secondary | ICD-10-CM

## 2023-01-30 DIAGNOSIS — E669 Obesity, unspecified: Secondary | ICD-10-CM

## 2023-01-30 DIAGNOSIS — E1169 Type 2 diabetes mellitus with other specified complication: Secondary | ICD-10-CM

## 2023-01-30 DIAGNOSIS — S060XAA Concussion with loss of consciousness status unknown, initial encounter: Secondary | ICD-10-CM | POA: Insufficient documentation

## 2023-01-30 DIAGNOSIS — S060X0D Concussion without loss of consciousness, subsequent encounter: Secondary | ICD-10-CM

## 2023-01-30 LAB — POCT GLYCOSYLATED HEMOGLOBIN (HGB A1C): HbA1c, POC (controlled diabetic range): 11 % — AB (ref 0.0–7.0)

## 2023-01-30 NOTE — Assessment & Plan Note (Addendum)
A1c today poorly controlled, likely in setting of being out of Trulicity for 2 months  Recommended increasing glyburide to 2.5 mg daily, continue metformin Will reach out to pharmacy team for assistance with GLP1, as she has previously tried SGLT2 and did not tolerate, and also had hypoglycemia with higher doses of sulfonylureas She will schedule f/u appt in 2 weeks with pharmacy team (consider Liberate trial?) She has previously not wanted to start insulin but we are running out of medication options for her to control her diabetes. Also overdue for eye exam, and will need repeat BMP at follow up with pharmacy

## 2023-01-30 NOTE — Progress Notes (Signed)
    SUBJECTIVE:   CHIEF COMPLAINT / HPI:   Concussion follow up- head hit on car trunk. Denies headaches, nausea, vomiting, trouble with confusion or decreased acuity. No vision changes. She feels these symptoms she has previously had all resolved and now wanting reassurance.  T2DM- last A1c 9.1, checking today A1c 11.0. Currently taking glyburide 1.25 mg daily, metformin 1000mg  BID. Has been out of Trulicity 3 mg weekly for about two months. Needs an eye exam as sometimes she notes black spots in her vision, but no vision loss. She wonders if her metformin is causing muscle cramps.   PERTINENT  PMH / PSH: T2DM, HLD, GERD, HTN, BPPV, h/o umbilical hernia repair  OBJECTIVE:   BP 132/70   Pulse 66   Temp 97.9 F (36.6 C)   Ht 5\' 1"  (1.549 m)   Wt 230 lb 9.6 oz (104.6 kg)   LMP  (LMP Unknown)   SpO2 97%   BMI 43.57 kg/m   General: A&O, NAD HEENT: No sign of trauma, EOM grossly intact Cardiac: RRR, no m/r/g Respiratory: CTAB, normal WOB, no w/c/r GI: Soft, NTTP, non-distended  Extremities: NTTP, no peripheral edema. Neuro: Normal gait, moves all four extremities appropriately. Psych: Appropriate mood and affect   ASSESSMENT/PLAN:   Secondary diabetes mellitus with hypercholesterolemia A1c today   Type 2 diabetes mellitus with obesity A1c today poorly controlled, likely in setting of being out of Trulicity for 2 months  Recommended increasing glyburide to 2.5 mg daily, continue metformin Will reach out to pharmacy team for assistance with GLP1, as she has previously tried SGLT2 and did not tolerate, and also had hypoglycemia with higher doses of sulfonylureas She will schedule f/u appt in 2 weeks with pharmacy team (consider Liberate trial?) She has previously not wanted to start insulin but we are running out of medication options for her to control her diabetes. Also overdue for eye exam, and will need repeat BMP at follow up with pharmacy  Concussion Symptoms have all  resolved, no neurologic symptoms, no further imaging or work-up required     Lenoria Chime, [place]

## 2023-01-30 NOTE — Assessment & Plan Note (Signed)
Symptoms have all resolved, no neurologic symptoms, no further imaging or work-up required

## 2023-01-30 NOTE — Assessment & Plan Note (Signed)
A1c today.  

## 2023-01-30 NOTE — Patient Instructions (Addendum)
It was wonderful to see you today.  Please bring ALL of your medications with you to every visit.   Today we talked about:  For your diabetes, A1c today was 11.0 I have reached out to pharmacy team to help with your medications  Please take the vitamin D once weekly until gone and then we will recheck your levels  Thank you for choosing [facility].   Please call [phone] with any questions about today's appointment.  Please arrive at least 15 minutes prior to your scheduled appointments.   If you had blood work today, I will send you a MyChart message or a letter if results are normal. Otherwise, I will give you a call.   If you had a referral placed, they will call you to set up an appointment. Please give [place] a call if you don't hear back in the next 2 weeks.   If you need additional refills before your next appointment, please call your pharmacy first.   Yehuda Savannah, MD  Family Medicine

## 2023-02-17 ENCOUNTER — Other Ambulatory Visit: Payer: Self-pay | Admitting: Family Medicine

## 2023-02-17 DIAGNOSIS — T7840XA Allergy, unspecified, initial encounter: Secondary | ICD-10-CM

## 2023-04-18 ENCOUNTER — Other Ambulatory Visit: Payer: Self-pay | Admitting: Family Medicine

## 2023-04-18 DIAGNOSIS — Z862 Personal history of diseases of the blood and blood-forming organs and certain disorders involving the immune mechanism: Secondary | ICD-10-CM

## 2023-04-19 ENCOUNTER — Other Ambulatory Visit: Payer: Self-pay

## 2023-04-19 DIAGNOSIS — E1169 Type 2 diabetes mellitus with other specified complication: Secondary | ICD-10-CM

## 2023-04-19 MED ORDER — GLYBURIDE 1.25 MG PO TABS: 2.5000 mg | ORAL_TABLET | Freq: Every day | ORAL | 1 refills | Status: DC

## 2023-05-18 ENCOUNTER — Other Ambulatory Visit: Payer: Self-pay | Admitting: Family Medicine

## 2023-05-18 DIAGNOSIS — T7840XA Allergy, unspecified, initial encounter: Secondary | ICD-10-CM

## 2023-06-27 ENCOUNTER — Ambulatory Visit (INDEPENDENT_AMBULATORY_CARE_PROVIDER_SITE_OTHER): Payer: Self-pay | Admitting: Family Medicine

## 2023-06-27 VITALS — BP 123/80 | HR 81 | Temp 98.2°F | Ht 61.0 in | Wt 222.2 lb

## 2023-06-27 DIAGNOSIS — J302 Other seasonal allergic rhinitis: Secondary | ICD-10-CM

## 2023-06-27 DIAGNOSIS — E669 Obesity, unspecified: Secondary | ICD-10-CM

## 2023-06-27 DIAGNOSIS — M79605 Pain in left leg: Secondary | ICD-10-CM

## 2023-06-27 DIAGNOSIS — M79604 Pain in right leg: Secondary | ICD-10-CM

## 2023-06-27 DIAGNOSIS — E1169 Type 2 diabetes mellitus with other specified complication: Secondary | ICD-10-CM

## 2023-06-27 NOTE — Patient Instructions (Addendum)
It was wonderful to see you today.  Please bring ALL of your medications with you to every visit.   Today we talked about:  - we are checking lab work and getting you an ultrasound of your legs tomorrow at 4:00 pm - I gave you a note for work to go back Monday  Thank you for choosing [facility].   Please call [phone] with any questions about today's appointment.  Please arrive at least 15 minutes prior to your scheduled appointments.   If you had blood work today, I will send you a MyChart message or a letter if results are normal. Otherwise, I will give you a call.   If you had a referral placed, they will call you to set up an appointment. Please give [place] a call if you don't hear back in the next 2 weeks.   If you need additional refills before your next appointment, please call your pharmacy first.   Burley Saver, MD  Family Medicine

## 2023-06-27 NOTE — Progress Notes (Signed)
    SUBJECTIVE:   CHIEF COMPLAINT / HPI:  Pt presents to discuss leg pain and nasal congestion.  Nasal congestion- feeling pain in her ears, no changes in hearing, and nasal congestion sinus pressure. Some dry cough due to nasal congestion. No shortness of breath. No sick contacts. Worse first thing in the AM. Has been taking cetirizine and flonase. Wanted ears checked. No fevers or chills or muscle aches. Wondering if this is her allergies.  Leg pain- noting crmpaing in her legs and trouble walking for last several months. Will feel like pins and needles but not every day. Takes her gabapentin only once a day and is wondering if it would help to take it more. She also notes her legs feeling cold at night. Wants to have her thyroid checked. No feet turning blue or black discoloration. Feels the pins and needles esp in calfs bilaterally. Has been wearing compression stockings and feels this has helped. No leg swelling or varicose veins noted. Can be worse with walking. 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  PERTINENT  PMH / PSH: T2DM, HLD  OBJECTIVE:   BP 123/80   Pulse 81   Temp 98.2 F (36.8 C)   Ht 5\' 1"  (1.549 m)   Wt 222 lb 3.2 oz (100.8 kg)   LMP  (LMP Unknown)   SpO2 96%   BMI 41.98 kg/m   General: A&O, NAD HEENT: No sign of trauma, EOM grossly intact, b/l TM clear with effusion or purulence, no lymphadenopathy, no thyromegaly, OP with postnasal drip without erythema Cardiac: RRR, no m/r/g Respiratory: CTAB, normal WOB, no w/c/r GI: Soft, NTTP, non-distended  Extremities: NTTP, no peripheral edema. Normal cap refill and warmth of toes bilaterally, normal sensation in feet bilaterally with monofilament testing. 2+ dorsalis pedis and posterior tibial pulses bilaterally. Hair growth noted on bilateral feet/big toes. Neuro: Normal gait, moves all four extremities appropriately. Psych: Appropriate mood and affect  Dermatologic Exam: Nails: No onchomycosis. No nail  bed thickening. Callouses: No callouses. No fissures. Web spaces: No macerations or open lesions Redness/Erythema: None  Musculoskeletal Exam: No bunion, hammertoes, prominent metatarsals, collapsed arch, or previous amputation. Vascular Assessment: Pedal hair growth present. 2+ posterior tibial and dorsalis pedis pulses. Neurologic Exam: 10-gram monofilament exam, R 6/6 and L 6/6.    ASSESSMENT/PLAN:   Bilateral leg pain Ddx includes peripheral neuropathy 2/2 diabetes or B12 def from metformin use, PAD, or venous insufficiency Reassuringly normal pulses and cap refill of feet bilaterally. ABI ordered for tomorrow, instructed patient to not wear compression stockings until we discuss the results of these, note for work provided Will check B12, TSH, CMP, A1c today Discussed increasing gabapentin to TID dosing in case related to neuropathy If no signs of PAD can resume compression stockings as she feels this has helped ED precautions of blue/black discoloration of toes discussed  Seasonal allergies Seasonal allergies, continue flonase and cetirizine, suspect eustacian tube dysfunction as cause of ear pain as no signs of infection today, reassurance provided   Instructed to make follow up to discuss T2DM and annual physical as she is due for many HCM items.  Total of 49 minutes spent in direct patient care with patient.   Billey Co, MD [facility]

## 2023-06-27 NOTE — Assessment & Plan Note (Signed)
Ddx includes peripheral neuropathy 2/2 diabetes or B12 def from metformin use, PAD, or venous insufficiency Reassuringly normal pulses and cap refill of feet bilaterally. ABI ordered for tomorrow, instructed patient to not wear compression stockings until we discuss the results of these, note for work provided Will check B12, TSH, CMP, A1c today Discussed increasing gabapentin to TID dosing in case related to neuropathy If no signs of PAD can resume compression stockings as she feels this has helped ED precautions of blue/black discoloration of toes discussed

## 2023-06-27 NOTE — Assessment & Plan Note (Signed)
Seasonal allergies, continue flonase and cetirizine, suspect eustacian tube dysfunction as cause of ear pain as no signs of infection today, reassurance provided

## 2023-06-28 ENCOUNTER — Ambulatory Visit (HOSPITAL_COMMUNITY)
Admission: RE | Admit: 2023-06-28 | Discharge: 2023-06-28 | Disposition: A | Discharge status: Home / Self Care | Payer: Self-pay | Source: Ambulatory Visit | Attending: Family Medicine | Admitting: Family Medicine

## 2023-06-28 ENCOUNTER — Telehealth: Payer: Self-pay | Admitting: Family Medicine

## 2023-06-28 ENCOUNTER — Ambulatory Visit (HOSPITAL_COMMUNITY): Payer: Self-pay

## 2023-06-28 DIAGNOSIS — R748 Abnormal levels of other serum enzymes: Secondary | ICD-10-CM

## 2023-06-28 DIAGNOSIS — M79605 Pain in left leg: Secondary | ICD-10-CM

## 2023-06-28 DIAGNOSIS — M79604 Pain in right leg: Secondary | ICD-10-CM

## 2023-06-28 LAB — COMPREHENSIVE METABOLIC PANEL
ALT: 73 IU/L — ABNORMAL HIGH (ref 0–32)
AST: 40 IU/L (ref 0–40)
Albumin: 4 g/dL (ref 3.8–4.9)
Alkaline Phosphatase: 143 IU/L — ABNORMAL HIGH (ref 44–121)
BUN/Creatinine Ratio: 20 (ref 9–23)
BUN: 12 mg/dL (ref 6–24)
Bilirubin Total: 0.4 mg/dL (ref 0.0–1.2)
CO2: 22 mmol/L (ref 20–29)
Calcium: 9.6 mg/dL (ref 8.7–10.2)
Chloride: 99 mmol/L (ref 96–106)
Creatinine, Ser: 0.6 mg/dL (ref 0.57–1.00)
Globulin, Total: 2.5 g/dL (ref 1.5–4.5)
Glucose: 215 mg/dL — ABNORMAL HIGH (ref 70–99)
Potassium: 4.2 mmol/L (ref 3.5–5.2)
Sodium: 140 mmol/L (ref 134–144)
Total Protein: 6.5 g/dL (ref 6.0–8.5)
eGFR: 105 mL/min/{1.73_m2} (ref >59)

## 2023-06-28 LAB — TSH RFX ON ABNORMAL TO FREE T4: TSH: 0.498 u[IU]/mL (ref 0.450–4.500)

## 2023-06-28 LAB — HEMOGLOBIN A1C
Est. average glucose Bld gHb Est-mCnc: 301 mg/dL
Hgb A1c MFr Bld: 12.1 % — ABNORMAL HIGH (ref 4.8–5.6)

## 2023-06-28 NOTE — Telephone Encounter (Signed)
Called patient with Spanish interpretorx3.  Confirmed that ABI without signs of peripheral vascular disease. Ok for patient to resume wearing compression stockings.  Discussed normal TSH. Discussed elevated A1c and need for appt to discuss diabetes management further. Discussed form renewed for her Trulicity. Also discussed elevated liver enzymes and need to repeat this with other lab work in the next month. Have future ordered this - hepatic function panel, hep c Ab, GGT.  Discussed as I do not have current soon openings that I would like her to see Dr Raymondo Band to help with diabetes management as A1c was 12 and want to get under better control. Scheduled for next Wednesday 07/05/23 with Dr Raymondo Band.   Also scheduled for annual physical with me on Aug 14, 2023 as she is due for HCM items.  Burley Saver MD

## 2023-07-05 ENCOUNTER — Ambulatory Visit (INDEPENDENT_AMBULATORY_CARE_PROVIDER_SITE_OTHER): Payer: Self-pay | Admitting: Pharmacist

## 2023-07-05 ENCOUNTER — Encounter: Payer: Self-pay | Admitting: Pharmacist

## 2023-07-05 DIAGNOSIS — T7840XA Allergy, unspecified, initial encounter: Secondary | ICD-10-CM

## 2023-07-05 DIAGNOSIS — Z862 Personal history of diseases of the blood and blood-forming organs and certain disorders involving the immune mechanism: Secondary | ICD-10-CM

## 2023-07-05 DIAGNOSIS — E669 Obesity, unspecified: Secondary | ICD-10-CM

## 2023-07-05 DIAGNOSIS — E785 Hyperlipidemia, unspecified: Secondary | ICD-10-CM

## 2023-07-05 DIAGNOSIS — E1169 Type 2 diabetes mellitus with other specified complication: Secondary | ICD-10-CM

## 2023-07-05 MED ORDER — EZETIMIBE 10 MG PO TABS: 10.0000 mg | ORAL_TABLET | Freq: Every day | ORAL | 2 refills | Status: DC

## 2023-07-05 MED ORDER — FLUTICASONE PROPIONATE 50 MCG/ACT NA SUSP: 2.0000 | Freq: Every day | NASAL | 6 refills | Status: AC

## 2023-07-05 NOTE — Progress Notes (Signed)
S:     Chief Complaint  Patient presents with   Medication Management    Diabetes   57 y.o. female who presents for diabetes evaluation, education, and management. Patient arrives in good spirits and presents without any assistance. Patient speaks Spanish and entire visit was conducted with assistance of video translator Vladimir Crofts [phone].  Patient was referred and last seen by Primary Care Provider, Dr. Miquel Dunn, on 06/27/23.   PMH is significant for seasonal allergies and T2DM. At last visit with Dr.Pray, increased gabapentin to TID dosing for neuropathy.  Patient reports Diabetes was diagnosed 19 years ago.   Current diabetes medications include: Trulicity (dulaglutide) 3 mg once a week, Diabeta (glyburide) 1.25 mg 2 tablets once daily (patient reports running out of supply 4 months ago), metformin 1000 mg twice daily Current hypertension medications include: lisinopril 20 mg once daily Current hyperlipidemia medications include: Zetia (ezetimibe) 10 mg once daily  Patient reports adherence to taking all medications as prescribed besides aspirin. Reports taking aspirin around 3 times a week.  Do you feel that your medications are working for you? Yes. Have you been experiencing any side effects to the medications prescribed? Yes, patient reports cholesterol medication ezetimibe is causing constipation (likely due to the Trulicity). Do you have any problems obtaining medications due to transportation or finances? Uninsured.  Patient denies hypoglycemic events.  Reported home fasting blood sugars: ranges between 130 and 180 mg/dL, denies readings higher than 200  Reported 2 hour post-meal/random blood sugars: reports forgetting to check after eating  Patient denies nocturia (nighttime urination).  Patient reports neuropathy (nerve pain) mostly in the legs. Patient reports visual changes. Reports seeing black spots in her vision.  Patient reports self foot exams.   Patient  reported dietary habits: Eats 2-3 meals/day Breakfast: eggs with tortillas/toast Lunch: meat, rice, beans (eats a lot of rice as part of culture) Dinner: not a heavy meal, sandwich or tea with toast  Drinks: mostly water and tea  Patient-reported exercise habits: walks a lot and goes to the gym   O:   Review of Systems  All other systems reviewed and are negative.   Physical Exam Constitutional:      Appearance: Normal appearance.  Neurological:     Mental Status: She is alert.  Psychiatric:        Mood and Affect: Mood normal.        Behavior: Behavior normal.        Thought Content: Thought content normal.        Judgment: Judgment normal.     Lab Results  Component Value Date   HGBA1C 12.1 (H) 06/27/2023   Vitals:   07/05/23 1041  BP: 128/64  Pulse: 70  SpO2: 100%    Lipid Panel     Component Value Date/Time   CHOL 172 10/17/2022 1153   TRIG 252 (H) 10/17/2022 1153   HDL 45 10/17/2022 1153   CHOLHDL 3.8 10/17/2022 1153   CHOLHDL 3.2 10/14/2016 0835   VLDL 33 (H) 10/14/2016 0835   LDLCALC 85 10/17/2022 1153   LDLDIRECT 108 (H) 03/12/2014 1002    Clinical Atherosclerotic Cardiovascular Disease (ASCVD): No  The 10-year ASCVD risk score (Arnett DK, et al., 2019) is: 5.8%   Values used to calculate the score:     Age: 8 years     Sex: Female     Is Non-Hispanic African American: No     Diabetic: Yes     Tobacco smoker: No  Systolic Blood Pressure: 128 mmHg     Is BP treated: Yes     HDL Cholesterol: 45 mg/dL     Total Cholesterol: 172 mg/dL    A/P: Diabetes longstanding currently uncontrolled with latest A1c of 11% (01/2023), however seems improved based on home fasting BG readings of 130-180 mg/dL. Patient is able to verbalize appropriate hypoglycemia management plan. Medication adherence appears good.  - Increased Trulicity (dulaglutide) from 3 mg to 4.5 mg once weekly.  - Discontinued Diabeta (glyburide) 1.25 mg once daily. - Continued  metformin 1000 mg twice daily. - Instructed patient to check post prandial glucose at least 3 times a week and bring readings to the next visit. - Consider initiating SGLT2 at next visit. -Patient educated on purpose, proper use, and potential adverse effects of increased dose of Trulicity (nausea). -Extensively discussed pathophysiology of diabetes, recommended lifestyle interventions, dietary effects on blood sugar control.  -Counseled on s/sx of and management of hypoglycemia.    ASCVD risk - primary prevention in patient with diabetes. Last LDL as of 10/17/22 is 85 not at goal of <70 mg/dL.  - Continued ezetimibe 10 mg once daily.   - Emphasized importance of taking aspirin 81 mg once daily.  Hypertension longstanding currently controlled with office reading of 128/64 mm Hg. Goal is < 130/80 mmHg. - Continued lisinopril 20 mg once daily.  - Refilled Zetia (ezetimibe) and Flonase (fluticasone) as per patient's request. Collaborating with Siri Cole CPhT for refill of Trulicity (dulaglutide).  Deferred refill of ferrous sulfate for follow-up of PCP (constipation is concerning at this time).   Written patient instructions provided. Patient verbalized understanding of treatment plan.  Total time in face to face counseling 57 minutes.    Follow-up:  Pharmacist visit on 07/19/23 with Dr. Raymondo Band. PCP clinic visit on 08/14/23 with Dr.Pray Patient seen with Andee Poles, PharmD Candidate.

## 2023-07-05 NOTE — Patient Instructions (Addendum)
  Fue agradable verte hoy!  Su objetivo de Production assistant, radio es 80-130 antes de comer y [place] de 180 despus de comer.  Cambios de medicacin: Aumente Trulicity (dulaglutida) a 4,5 mg una vez por semana.  Suspenda Diabeta (glibenclamida) y sulfato ferroso.  Hemos enviado nuevas recetas de Zetia (ezetimiba) y Flonase (fluticasona).  Contine con todos los dems medicamentos de la [place].   Controle los niveles de Banker en casa despus del almuerzo y la cena al menos 3 veces por semana y lleve un registro (glucmetro o una hoja de papel) para llevarlo a su prxima visita el mircoles 18 de septiembre a las 10:00 a. m.  Contine con el buen trabajo con dieta y ejercicio. Trate de llevar una dieta rica en verduras, frutas y carnes magras (pollo, pavo, pescado). Trate de limitar el consumo de sal comiendo verduras frescas o congeladas (en lugar de enlatadas), enjuague las verduras enlatadas antes de cocinarlas y no agregue sal adicional a las comidas.  It was nice to see you today!  Your goal blood sugar is 80-130 before eating and less than 180 after eating.  Medication Changes: Increase Trulicity (dulaglutide) to 4.5 mg once weekly.  Discontinue Diabeta (glyburide) and ferrous sulfate.  We have sent new prescriptions for the Zetia (ezetimibe) and Flonase (fluticasone).  Continue all other medication the same.   Monitor blood sugars at home after lunch and dinner at least 3 times a week and keep a log (glucometer or piece of paper) to bring with you to your next visit on Wednesday September 18 10:00 a.m.  Keep up the good work with diet and exercise. Aim for a diet full of vegetables, fruit and lean meats (chicken, [place], fish). Try to limit salt intake by eating fresh or frozen vegetables (instead of canned), rinse canned vegetables prior to cooking and do not add any additional salt to meals.

## 2023-07-05 NOTE — Assessment & Plan Note (Signed)
Diabetes longstanding currently uncontrolled with latest A1c of 11% (01/2023), however seems improved based on home fasting BG readings of 130-180 mg/dL. Patient is able to verbalize appropriate hypoglycemia management plan. Medication adherence appears good.  - Increased Trulicity (dulaglutide) from 3 mg to 4.5 mg once weekly.  - Discontinued Diabeta (glyburide) 1.25 mg once daily. - Continued metformin 1000 mg twice daily. - Instructed patient to check post prandial glucose at least 3 times a week and bring readings to the next visit. - Consider initiating SGLT2 at next visit. -Patient educated on purpose, proper use, and potential adverse effects of increased dose of Trulicity (nausea). -Extensively discussed pathophysiology of diabetes, recommended lifestyle interventions, dietary effects on blood sugar control.  -Counseled on s/sx of and management of hypoglycemia.

## 2023-07-06 NOTE — Progress Notes (Signed)
Reviewed and agree with Dr Koval's plan.   

## 2023-07-07 ENCOUNTER — Telehealth: Payer: Self-pay

## 2023-07-07 NOTE — Telephone Encounter (Signed)
-----   Message from Madelon Lips sent at 07/05/2023  8:41 PM EDT ----- Durward Mallard,  Please note dose increase Trulicity.

## 2023-07-07 NOTE — Telephone Encounter (Signed)
Patient due for re-enrollment  Pt shouldve received application in the mail. Enrollment ended 06/29/23. Will mail another application to patients home.   If patient runs out of medication, pharmacy at [facility] has medication available in [facility] stock (no charge meds). Should be able to fill medication there while processing re-enrollment.

## 2023-07-10 ENCOUNTER — Other Ambulatory Visit: Payer: Self-pay

## 2023-07-10 MED ORDER — TRULICITY 4.5 MG/0.5ML ~~LOC~~ SOAJ
4.5000 mg | SUBCUTANEOUS | 1 refills | Status: DC
  Filled 2023-07-10: qty 2, 28d supply, fill #0

## 2023-07-10 NOTE — Telephone Encounter (Signed)
Higher dose provided for coverage during re-enrollment to [facility] cares

## 2023-07-10 NOTE — Telephone Encounter (Signed)
Reviewed and agree with Dr Koval's plan.   

## 2023-07-11 ENCOUNTER — Other Ambulatory Visit: Payer: Self-pay

## 2023-07-12 ENCOUNTER — Other Ambulatory Visit: Payer: Self-pay

## 2023-07-19 ENCOUNTER — Ambulatory Visit: Payer: Self-pay | Admitting: Pharmacist

## 2023-08-01 ENCOUNTER — Encounter: Payer: Self-pay | Admitting: Student

## 2023-08-01 ENCOUNTER — Ambulatory Visit (INDEPENDENT_AMBULATORY_CARE_PROVIDER_SITE_OTHER): Payer: Self-pay | Admitting: Student

## 2023-08-01 VITALS — BP 133/77 | HR 68 | Wt 219.0 lb

## 2023-08-01 DIAGNOSIS — E1169 Type 2 diabetes mellitus with other specified complication: Secondary | ICD-10-CM

## 2023-08-01 DIAGNOSIS — E669 Obesity, unspecified: Secondary | ICD-10-CM

## 2023-08-01 DIAGNOSIS — R1012 Left upper quadrant pain: Secondary | ICD-10-CM

## 2023-08-01 MED ORDER — PANTOPRAZOLE SODIUM 40 MG PO TBEC: 40.0000 mg | DELAYED_RELEASE_TABLET | Freq: Every day | ORAL | 0 refills | Status: DC

## 2023-08-01 NOTE — Progress Notes (Signed)
    SUBJECTIVE:   CHIEF COMPLAINT / HPI:   Abdominal Pain:  -Left sided ongoing for about a month  -Trulicity dosage increase about a month ago  -No vomiting, a little bit of nausea  -Eating well and using the bathroom normally  -No association with PO intake  -No longer taking pepcid, last taken a couple of years ago  -No hematochezia or hematemesis - Abdominal pain is infrequent, waxes and wanes, not present today -Was added to my clinic from Dr. Macky Lower clinic as the patient is currently taking Trulicity  PERTINENT  PMH / PSH:  Hypertension GERD Type 2 diabetes on Trulicity Abnormal uterine bleeding history History of iron deficiency anemia Vitamin D deficiency   OBJECTIVE:   BP 133/77 (BP Location: Right Arm, Patient Position: Sitting, Cuff Size: Normal)   Pulse 68   Wt 219 lb (99.3 kg)   LMP  (LMP Unknown)   SpO2 98%   BMI 36.00 kg/m   General: Alert and oriented in no apparent distress Heart: Regular rate and rhythm with no murmurs appreciated Lungs: CTA bilaterally, no wheezing Abdomen: Bowel sounds present, no abdominal pain, nondistended, soft, no rebound tenderness, no guaridng  Skin: Warm and dry    ASSESSMENT/PLAN:   Assessment & Plan Left upper quadrant abdominal pain Left upper and lower quadrant abdominal pain, not present today during our visit.  Unremarkable abdominal exam with nonacute abdomen.  Given that the patient had onset of the pain after increasing Trulicity, will hold off on Trulicity for now.  Continue with abdominal labs including CMP, CBC, lipase.  Will have her follow-up in 1 week to see how she is feeling.  Additionally ordered H. pylori testing, cannot be done during this visit but will be done tomorrow.  Will order her pantoprazole after completing H. pylori testing for GERD as it may be contributing.  Close follow-up in 1 week scheduled with access to care prior to leaving.  If patient is still having pain and has normal lab work,  would consider imaging study.    Spanish interpreter via IPAD  Alfredo Martinez, MD [facility]

## 2023-08-01 NOTE — Progress Notes (Signed)
    S:     Chief Complaint  Patient presents with   Medication Management    Diabetes - Follow-Up   57 y.o. female who presents for diabetes evaluation, education, and management. Patient arrives in good spirits and presents without any assistance. Patient speaks Spanish and entire visit was conducted with assistance of video translator Maralyn Sago [phone]   Patient was referred and last seen by Primary Care Provider, Dr. Miquel Dunn, on 06/27/23. Marland Kitchen   PMH is significant for seasonal allergies and Diabetes.  At last visit, Trulicity (dulaglutide) dose was increased from 3 to 4.5 mg weekly AND glyburide was discontinued. Patient reports intermittent left sided abdominal pain which she reports has worsened since the dose increase.   Patient reports Diabetes was diagnosed 19 years ago.   Patient reports adherence to taking all medications as prescribed. She was excited about her 6 lb weight loss since last visit.    O:   Review of Systems  Gastrointestinal:  Positive for abdominal pain (left sided intermittent).  Musculoskeletal:  Positive for back pain (right sided intermittent lumbar spine).    Physical Exam  7 day average blood glucose: 150s-low 200s over the last two weeks.   Lab Results  Component Value Date   HGBA1C 12.1 (H) 06/27/2023   Vitals:   08/01/23 0939 08/01/23 0940  BP: (!) 141/75 133/77  Pulse: 68   SpO2: 98%     Lipid Panel     Component Value Date/Time   CHOL 172 10/17/2022 1153   TRIG 252 (H) 10/17/2022 1153   HDL 45 10/17/2022 1153   CHOLHDL 3.8 10/17/2022 1153   CHOLHDL 3.2 10/14/2016 0835   VLDL 33 (H) 10/14/2016 0835   LDLCALC 85 10/17/2022 1153   LDLDIRECT 108 (H) 03/12/2014 1002    A/P: Diabetes longstanding currently slightly improved with fasting blood sugar readings in the 100s. Patient is able to verbalize appropriate hypoglycemia management plan. Medication adherence appears good. Abdominal pain with increased frequency since dose increase of  trulicity (dulaglutide) deferred for evaluaiton by Dr. Jena Gauss.  - HOLD Trulicity (dulaglutide) 4.5 mg once weekly until GI /abdominal pain work-up completed.  - Continued metformin 1000 mg twice daily. - Consider initiating SGLT2 at next visit.   Written patient instructions provided. Patient verbalized understanding of treatment plan.  Total time in face to face counseling 28 minutes.    Follow-up:  Pharmacist PRN PCP clinic visit in 09/01/2023  Patient seen with Caprice Beaver, PharmD Candidate and Shona Simpson, PharmD Candidate.

## 2023-08-01 NOTE — Patient Instructions (Addendum)
  Fue un placer verte hoy!  Su objetivo de Production assistant, radio es 80-130 antes de comer y [place] de 180 despus de comer.  Sus niveles de Production assistant, radio continan mejorando con los esfuerzos de su dieta y ejercicio (andar en bicicleta y caminar).  Cambios de medicacin: Contine con todos los dems medicamentos de la misma [place].   Controle los niveles de Banker en casa y lleve un registro (glucmetro o una hoja de papel) para llevarlo a su prxima visita.  Contine con el buen trabajo con dieta y ejercicio. Trate de llevar una dieta rica en verduras, frutas y carnes magras (pollo, pavo, pescado). Trate de limitar el consumo de sal comiendo verduras frescas o congeladas (en lugar de enlatadas), enjuague las verduras enlatadas antes de cocinarlas y no agregue sal adicional a las comidas.    It was nice to see you today!  Your goal blood sugar is 80-130 before eating and less than 180 after eating.  Your blood sugars continue to improve with your efforts of your diet and exercising (bike and walking).  Medication Changes: Continue all other medication the same.   Monitor blood sugars at home and keep a log (glucometer or piece of paper) to bring with you to your next visit.  Keep up the good work with diet and exercise. Aim for a diet full of vegetables, fruit and lean meats (chicken, [place], fish). Try to limit salt intake by eating fresh or frozen vegetables (instead of canned), rinse canned vegetables prior to cooking and do not add any additional salt to meals.     We will be getting some labs today. I want you to come in in one week to reassess. I will update with lab results

## 2023-08-01 NOTE — Assessment & Plan Note (Signed)
Diabetes longstanding currently slightly improved with fasting blood sugar readings in the 100s. Patient is able to verbalize appropriate hypoglycemia management plan. Medication adherence appears good. Abdominal pain with increased frequency since dose increase of trulicity (dulaglutide) deferred for evaluaiton by Dr. Jena Gauss.  - HOLD Trulicity (dulaglutide) 4.5 mg once weekly until GI /abdominal pain work-up completed.  - Continued metformin 1000 mg twice daily. - Consider initiating SGLT2 at next visit.

## 2023-08-02 ENCOUNTER — Encounter: Payer: Self-pay | Admitting: Student

## 2023-08-02 LAB — COMPREHENSIVE METABOLIC PANEL
ALT: 74 [IU]/L — ABNORMAL HIGH (ref 0–32)
AST: 41 [IU]/L — ABNORMAL HIGH (ref 0–40)
Albumin: 3.9 g/dL (ref 3.8–4.9)
Alkaline Phosphatase: 121 [IU]/L (ref 44–121)
BUN/Creatinine Ratio: 27 — ABNORMAL HIGH (ref 9–23)
BUN: 13 mg/dL (ref 6–24)
Bilirubin Total: 0.4 mg/dL (ref 0.0–1.2)
CO2: 23 mmol/L (ref 20–29)
Calcium: 9.3 mg/dL (ref 8.7–10.2)
Chloride: 103 mmol/L (ref 96–106)
Creatinine, Ser: 0.49 mg/dL — ABNORMAL LOW (ref 0.57–1.00)
Globulin, Total: 2.3 g/dL (ref 1.5–4.5)
Glucose: 182 mg/dL — ABNORMAL HIGH (ref 70–99)
Potassium: 4.1 mmol/L (ref 3.5–5.2)
Sodium: 140 mmol/L (ref 134–144)
Total Protein: 6.2 g/dL (ref 6.0–8.5)
eGFR: 111 mL/min/{1.73_m2} (ref >59)

## 2023-08-02 LAB — CBC WITH DIFFERENTIAL/PLATELET
Basophils Absolute: 0.1 10*3/uL (ref 0.0–0.2)
Basos: 1 %
EOS (ABSOLUTE): 0.1 10*3/uL (ref 0.0–0.4)
Eos: 1 %
Hematocrit: 43 % (ref 34.0–46.6)
Hemoglobin: 13.8 g/dL (ref 11.1–15.9)
Immature Grans (Abs): 0 10*3/uL (ref 0.0–0.1)
Immature Granulocytes: 0 %
Lymphocytes Absolute: 1.4 10*3/uL (ref 0.7–3.1)
Lymphs: 23 %
MCH: 28.3 pg (ref 26.6–33.0)
MCHC: 32.1 g/dL (ref 31.5–35.7)
MCV: 88 fL (ref 79–97)
Monocytes Absolute: 0.3 10*3/uL (ref 0.1–0.9)
Monocytes: 6 %
Neutrophils Absolute: 4.2 10*3/uL (ref 1.4–7.0)
Neutrophils: 69 %
Platelets: 155 10*3/uL (ref 150–450)
RBC: 4.88 x10E6/uL (ref 3.77–5.28)
RDW: 13.5 % (ref 11.7–15.4)
WBC: 6.1 10*3/uL (ref 3.4–10.8)

## 2023-08-02 LAB — LIPASE: Lipase: 60 U/L (ref 14–72)

## 2023-08-08 ENCOUNTER — Ambulatory Visit (INDEPENDENT_AMBULATORY_CARE_PROVIDER_SITE_OTHER): Payer: Self-pay | Admitting: Family Medicine

## 2023-08-08 VITALS — BP 138/72 | HR 62 | Ht 65.0 in | Wt 221.0 lb

## 2023-08-08 DIAGNOSIS — R1012 Left upper quadrant pain: Secondary | ICD-10-CM

## 2023-08-08 NOTE — Patient Instructions (Addendum)
If your results from today are abnormal, I will give you a call.  Otherwise I will send a letter in the mail  Please follow-up at your scheduled appointment on the 14th  Si sus resultados de hoy son anormales, Engineer, structural.  De lo contrario, enviar una carta por correo .   Por favor, haga un seguimiento en su cita programada para el da 14

## 2023-08-08 NOTE — Progress Notes (Signed)
    SUBJECTIVE:   CHIEF COMPLAINT / HPI:   Patient seen by Dr. Raymondo Band 10/1, at that time held Trulicity due to abdominal pain continued on metformin 1000 mg twice daily Consider SGLT2 Also advised to do H. pylori testing  Today, reports improvement in abdominal pain (LUQ) Pain is more so a discomfort, 3/10 After eating, pain usually worsens  She has not taken protonix in anticipation of h pylori testing No changes to bowel/bladder function  PERTINENT  PMH / PSH: Hypertension, diabetes  OBJECTIVE:   BP 138/72   Pulse 62   Ht 5\' 5"  (1.651 m)   Wt 221 lb (100.2 kg)   LMP  (LMP Unknown)   BMI 36.78 kg/m   General: NAD, pleasant, able to participate in exam Cardiac: RRR, no murmurs auscultated Respiratory: CTAB, normal WOB Abdomen: soft, nondistended, mild tenderness to palpation in the left upper quadrant Extremities: warm and well perfused, no edema or cyanosis Skin: warm and dry, no rashes noted Neuro: alert, no obvious focal deficits, speech normal Psych: Normal affect and mood  ASSESSMENT/PLAN:   Assessment & Plan Left upper quadrant abdominal pain Proceed with H. pylori testing today, follow-up results and initiate treatment as appropriate.  Reassuringly her pain is improving.  Patient has scheduled follow-up with PCP on the 14th, at that visit may consider restarting Trulicity at lower dose and or initiating further workup of her abdominal pain if persisting or worsening.   Vonna Drafts, MD [facility]

## 2023-08-10 ENCOUNTER — Encounter: Payer: Self-pay | Admitting: Student

## 2023-08-10 LAB — H. PYLORI BREATH TEST: H pylori Breath Test: NEGATIVE

## 2023-08-14 ENCOUNTER — Encounter: Payer: Self-pay | Admitting: Family Medicine

## 2023-08-14 ENCOUNTER — Ambulatory Visit (INDEPENDENT_AMBULATORY_CARE_PROVIDER_SITE_OTHER): Payer: Self-pay | Admitting: Family Medicine

## 2023-08-14 VITALS — BP 127/73 | HR 56 | Ht 64.0 in | Wt 224.4 lb

## 2023-08-14 DIAGNOSIS — R7401 Elevation of levels of liver transaminase levels: Secondary | ICD-10-CM

## 2023-08-14 DIAGNOSIS — R1013 Epigastric pain: Secondary | ICD-10-CM

## 2023-08-14 DIAGNOSIS — E1169 Type 2 diabetes mellitus with other specified complication: Secondary | ICD-10-CM

## 2023-08-14 DIAGNOSIS — E669 Obesity, unspecified: Secondary | ICD-10-CM

## 2023-08-14 DIAGNOSIS — Z7985 Long-term (current) use of injectable non-insulin antidiabetic drugs: Secondary | ICD-10-CM

## 2023-08-14 MED ORDER — MELATONIN 3 MG PO CAPS: 3.0000 mg | ORAL_CAPSULE | Freq: Every day | ORAL | 2 refills | Status: DC

## 2023-08-14 MED ORDER — TRULICITY 3 MG/0.5ML ~~LOC~~ SOAJ: 3.0000 mg | SUBCUTANEOUS | 3 refills | Status: DC

## 2023-08-14 NOTE — Patient Instructions (Addendum)
Fue maravilloso verte hoy.  Traiga TODOS sus medicamentos a cada visita.   Hoy hablamos de:  La vacuna contra la gripe y la neumona est disponible en [facility]: [address] [place], [place], [place] [address], ya que son ms baratas aqu. Phone: [phone]   Debe realizarse Neomia Dear mamografa: [place] de [place] [email or web address]  Tambin le corresponde una colonoscopia. Por favor haga un seguimiento para un examen de bienestar.  Reiniciaremos su Trulicity con 3 mg semanales; avseme si tiene dolor abdominal intenso o vmitos al reiniciarlo.   Hoy estamos [employer] de laboratorio de su hgado.   Karl Pock por elegir [facility].   Llame al [phone] si tiene alguna pregunta sobre la cita de [place].  Llegue al menos 15 minutos antes de sus citas programadas.   Si le hicieron [employer] de [employer], Transport planner un mensaje de MyChart o una carta si los [place] son normales. De lo contrario, te llamar.   Si recibi una derivacin, lo llamarn para programar una cita. Llmenos si no recibe respuesta en las prximas 2 semanas.   Si necesita resurtidos adicionales antes de su prxima cita, llame primero a su farmacia.  It was wonderful to see you today.  Please bring ALL of your medications with you to every visit.   Today we talked about:  Flu and Pneumonia shot is available at [facility]: [address] [place], [place], [place] [address] as they are cheapest here.   You are due for a mammogram: Mammography   [facility] [email or web address]  You are also due for a colonoscopy. Please make a follow up for a wellness exam.  We will restart your Trulicity at 3 mg weekly, please let me know if you have severe abdominal pain or vomiting with restarting it.    We are checking lab work for your liver today.   Thank you for choosing [facility].   Please call [phone] with any questions about today's appointment.  Please arrive at least 15 minutes prior to your scheduled appointments.   If you had blood work today, I will send you a MyChart message or a letter if results are normal. Otherwise, I will give you a call.   If you had a referral placed, they will call you to set up an appointment. Please give [place] a call if you don't hear back in the next 2 weeks.   If you need additional refills before your next appointment, please call your pharmacy first.   Burley Saver, MD  Family Medicine

## 2023-08-14 NOTE — Assessment & Plan Note (Addendum)
Restart trulicity at lower dose 3 mg weekly, monitor for side effects She has tried SGLT2 with side effects before,  could consider addition next if tolerating Trulicity Discussed signs/symptoms of pancreatitis to watch for but previously tolerating Trulicity for 2 years

## 2023-08-14 NOTE — Progress Notes (Signed)
    SUBJECTIVE:   CHIEF COMPLAINT / HPI:    T2DM- needs urine albumin cr testing. Needs eye exam. Was having LUQ and epigastric abd pain about two hours after eating. Was worse after going up to Trulicity 4.5mg  weekly, which she took for three weeks. Was not present at the 3 mg weekly dose. H pylori negative, has not yet started PPI. Lipase negative. Pain does not happen every day and is less severe and better since stopping. No pain currently. NO nausea or vomiting when not taking Trulicity. Would like to go back to lower dose of trulicity. Has tried SGLT2 in the past and felt she had side effects. NO urinary symptoms, burning with urination, diarrhea, blood in stools.   Elevated LFTs- remained one month later. She is ok with other blood work today. NO RUQ pain after eating her pain is usually epigastric or LLQ. She has not yet started the PPI.  Annual physical- she notes she will reschedule. Colonoscopy Mammogram - given website for financial assistance Flu/COVID vaccine - recommended to get at [facility] as she has orange card.   PERTINENT  PMH / PSH: T2DM, HLD, peripheral neuropathy,   OBJECTIVE:   BP 127/73   Pulse (!) 56   Ht 5\' 4"  (1.626 m)   Wt 224 lb 6.4 oz (101.8 kg)   LMP  (LMP Unknown)   SpO2 98%   BMI 38.52 kg/m   General: A&O, NAD HEENT: No sign of trauma, EOM grossly intact Cardiac: RRR, no m/r/g Respiratory: CTAB, normal WOB, no w/c/r GI: Soft, NTTP, non-distended , Murphy's sign negative, no guarding or rebound Extremities: NTTP, no peripheral edema. Neuro: Normal gait, moves all four extremities appropriately. Psych: Appropriate mood and affect   ASSESSMENT/PLAN:   Assessment & Plan Type 2 diabetes mellitus with obesity (HCC) Restart trulicity at lower dose 3 mg weekly, monitor for side effects She has tried SGLT2 with side effects before,  could consider addition next if tolerating Trulicity Discussed signs/symptoms of pancreatitis to watch for but previously  tolerating Trulicity for 2 years  Transaminitis Repeat LFT with GGT, hepatitis panel today If remain elevated will do RUQ sono to also assess gallbladder Epigastric abdominal pain Worsened with Trulicity 4.5 mg dose, was not present at 3 mg dosage Lipase negative, benign abd exam today, with shared decision making decided to restart Trulicity 3 mg dosing weekly, and monitor for symptoms Discussed if severe abdominal pain, nausea, vomiting, diarrhea to stop this but suspect this was related to increased Trulicity dosage with negative lipase Reasonable to start PPI to see if helps as her H pylori was negative Return precautions discussed     Billey Co, MD [facility]

## 2023-08-18 ENCOUNTER — Other Ambulatory Visit: Payer: Self-pay | Admitting: Family Medicine

## 2023-08-18 DIAGNOSIS — R748 Abnormal levels of other serum enzymes: Secondary | ICD-10-CM

## 2023-08-20 LAB — VITAMIN D 25 HYDROXY (VIT D DEFICIENCY, FRACTURES): Vit D, 25-Hydroxy: 16.4 ng/mL — ABNORMAL LOW (ref 30.0–100.0)

## 2023-08-20 LAB — SPECIMEN STATUS REPORT

## 2023-08-22 ENCOUNTER — Telehealth: Payer: Self-pay

## 2023-08-22 NOTE — Telephone Encounter (Signed)
Patient (with daughter) LVM on nurse line in regards to Trulicity.   She reports PCP sent in refills, however it went to [facility]. She reports she gets her supply from [facility]   I do see where patient was in the process of re enrollment for program.   Will forward to pharmacy to check the status of this.   Patient can not afford prescription at retail pharmacies.

## 2023-08-23 ENCOUNTER — Telehealth: Payer: Self-pay | Admitting: Family Medicine

## 2023-08-23 DIAGNOSIS — R7401 Elevation of levels of liver transaminase levels: Secondary | ICD-10-CM

## 2023-08-23 NOTE — Telephone Encounter (Signed)
Called patient with Spanish interpreter.  - Vit D insufficiency, recommend vitamin D 1000 international units daily - discussed mildly elevated ALT and elevated alk phos, but GGT negative so likely related to Vit D - discussed RUQ sono - discussed have been elevated several times in past so reasonable to do the [place], she is in agreement and requests morning appt Monday-Thursday, discussed needs to be fasting for 4 hours prior to [place]  Also discussed needs 3 mg Trulicity refilled, message sent to [place] with pharmacy team to work on this.   RUQ [place] ordered, forwarded to green team- please call and schedule [place] and then call patient with date & time, she prefers mornings Monday-Thursday.  Burley Saver MD

## 2023-08-24 ENCOUNTER — Other Ambulatory Visit: Payer: Self-pay | Admitting: Family Medicine

## 2023-08-24 DIAGNOSIS — E1169 Type 2 diabetes mellitus with other specified complication: Secondary | ICD-10-CM

## 2023-08-24 MED ORDER — TRULICITY 3 MG/0.5ML ~~LOC~~ SOAJ
3.0000 mg | SUBCUTANEOUS | 3 refills | Status: DC
  Filled 2023-08-24: qty 0.5, 7d supply, fill #0

## 2023-08-24 NOTE — Telephone Encounter (Signed)
Called patient with [facility] ID: [phone]. Informed patient of her upcoming [place] appointment 10/31 at 10am Informed her to arrive 15 minutes early.  Drusilla Kanner, CMA

## 2023-08-24 NOTE — Telephone Encounter (Signed)
Spoke with Dr Miquel Dunn.  Pt assistance has expired. Application has been mailed twice.  If needed, medication can be sent to [facility] pharmacy and filled under their DOH medications (free meds for uninsured).

## 2023-08-25 ENCOUNTER — Other Ambulatory Visit: Payer: Self-pay

## 2023-08-25 ENCOUNTER — Telehealth: Payer: Self-pay

## 2023-08-25 ENCOUNTER — Other Ambulatory Visit: Payer: Self-pay | Admitting: Family Medicine

## 2023-08-25 DIAGNOSIS — E1169 Type 2 diabetes mellitus with other specified complication: Secondary | ICD-10-CM

## 2023-08-25 MED ORDER — TRULICITY 3 MG/0.5ML ~~LOC~~ SOAJ
3.0000 mg | SUBCUTANEOUS | 3 refills | Status: DC
  Filled 2023-08-25 – 2023-09-22 (×2): qty 2, 28d supply, fill #0
  Filled 2023-11-03: qty 2, 28d supply, fill #1
  Filled 2024-01-04: qty 2, 28d supply, fill #2
  Filled 2024-02-06 (×2): qty 2, 28d supply, fill #3

## 2023-08-25 NOTE — Telephone Encounter (Signed)
-----   Message from Billey Co sent at 08/24/2023  8:04 PM EDT ----- Regarding: RE: Trulicity 3 mg restart Thanks Durward Mallard!  Green team, can you call her and let her know that Durward Mallard has sent her renewal application and verify her address if she wants [place] to send it again.  And then let her know I sent the Trulicity to [facility] so she can go get it there.  Thanks, Dr Miquel Dunn ----- Message ----- From: Otho Najjar, CPhT Sent: 08/23/2023  12:30 PM EDT To: Billey Co, MD Subject: RE: Trulicity 3 mg restart                     Hey!  I mailed her the renewal application twice but she never returned it :(  They've been sending her trulicity to [facility] pharmacy. It can be filled for free there using their DOH ([facility]) drugs- Free meds for uninsured patients. You can send it there with a note to pharmacy saying "DOH"! ----- Message ----- From: Billey Co, MD Sent: 08/23/2023   9:04 AM EDT To: Otho Najjar, CPhT Subject: Trulicity 3 mg restart                         Hi Camille,  I wanted to check on this patient's Trulicity- she wanted to go back down to 3 mg weekly as she had side effects on higher dose, is there anything else I need to do for this?  Thanks, [employer]

## 2023-08-25 NOTE — Telephone Encounter (Signed)
Called patient via [employer] ID: [phone] to ask patient to verify her current address and to also inform her about her medication that was sent in to [facility].  Drusilla Kanner, CMA

## 2023-08-28 ENCOUNTER — Other Ambulatory Visit: Payer: Self-pay | Admitting: Student

## 2023-08-29 LAB — ACUTE HEP PANEL AND HEP B SURFACE AB

## 2023-08-29 LAB — HEPATIC FUNCTION PANEL
ALT: 54 [IU]/L — ABNORMAL HIGH (ref 0–32)
AST: 24 [IU]/L (ref 0–40)
Albumin: 3.8 g/dL (ref 3.8–4.9)
Alkaline Phosphatase: 127 [IU]/L — ABNORMAL HIGH (ref 44–121)
Bilirubin Total: <0.2 mg/dL (ref 0.0–1.2)
Bilirubin, Direct: <0.1 mg/dL (ref 0.00–0.40)
Total Protein: 6 g/dL (ref 6.0–8.5)

## 2023-08-29 LAB — MICROALBUMIN / CREATININE URINE RATIO
Creatinine, Urine: 87.9 mg/dL
Microalb/Creat Ratio: 7 mg/g{creat} (ref 0–29)
Microalbumin, Urine: 6.4 ug/mL

## 2023-08-29 LAB — GAMMA GT: GGT: 12 [IU]/L (ref 0–60)

## 2023-08-31 ENCOUNTER — Ambulatory Visit (HOSPITAL_COMMUNITY)
Admission: RE | Admit: 2023-08-31 | Discharge: 2023-08-31 | Disposition: A | Discharge status: Home / Self Care | Payer: Self-pay | Source: Ambulatory Visit | Attending: Family Medicine | Admitting: Family Medicine

## 2023-08-31 DIAGNOSIS — R7401 Elevation of levels of liver transaminase levels: Secondary | ICD-10-CM

## 2023-09-04 ENCOUNTER — Ambulatory Visit: Payer: Self-pay | Admitting: Family Medicine

## 2023-09-05 ENCOUNTER — Other Ambulatory Visit: Payer: Self-pay

## 2023-09-12 NOTE — Patient Instructions (Incomplete)
Mammography   [facility] [email or web address]   Pap Testing  [place] [facility]  [email or web address]

## 2023-09-12 NOTE — Progress Notes (Unsigned)
    SUBJECTIVE:   CHIEF COMPLAINT / HPI:   Pt is a 57 y.o. yo female who presents for a well woman exam.  Menstrual Hx: psotmenopausal, Dec 2022 last menses, no vaginal bleeding since Obstetrical Hx: [id number] - Pregnancy complications: - Cesarian sections: 3 Sexual Hx: Sexually active, one partners. - H/o STIs: no ,declines testing Diet: discussed Exercise: discussed Tobacco use: never Alcohol use: denies Other illicit drug use: dnies IPV: Pt feels same at home and in current relationships, no past hx of emotional/physical/sexual abuse.  Pap smear: Last one 09/19/2021, results normal Colonoscopy: never had, DUE Mammogram: Last one 01/2020, normal, DUE DEXA scan: never had, FRAX score 17% major osteoporotic fracture and 0.9 % hip fracture (if not family parent fractured hip), thus not due yet, treating vit D deficiency as below and recommend daily calcium Skin exam: no concerns or rashes Desires flu and COVID vaccines- discussed can get at [facility]  Family Hx: - Breast cancer: none - Endometrial cancer: none - Cervical cancer: none - Ovarian Cancer: none - Colon Cancer: none  Elevated liver enzymes-  reviewed recent US and lab results. Discussed fatty liver and weight loss as main treatment. Will repeat hepatitis panel today as the patients lab was incomplete last time.  OBJECTIVE:   BP 124/70   Pulse 73   Temp 98.1 F (36.7 C)   Wt 222 lb 6.4 oz (100.9 kg)   LMP  (LMP Unknown)   SpO2 95%   BMI 38.17 kg/m   General: A&O, NAD HEENT: No sign of trauma, EOM grossly intact Cardiac: RRR, no m/r/g Respiratory: CTAB, normal WOB, no w/c/r GI: Soft, NTTP, non-distended  Extremities: NTTP, no peripheral edema. Neuro: Normal gait, moves all four extremities appropriately. Psych: Appropriate mood and affect   ASSESSMENT/PLAN:   Assessment & Plan Screen for colon cancer Referral to GI placed, no blood in stool or symptoms, has [employer] card so will see who is in  network Previously has had iron deficiency anemia so discussed why colonoscopy is preferred to FIT testing, recent CBC wnl Encounter for well woman exam Flu and COVID booster given today Elevated liver enzymes Reviewed recent lab work and repeated Hepatitis panel today negative, but non  immune to hep B, recommend vaccination at [facility] Most likely diagnosis is fatty liver disease, Discussed weight loss as main treatment for fatty liver disease Recommend 1 month follow up - consider repeat ferritin although previously has been low due to iron def anemia Vitamin D deficiency Elevated alk phos with normal GGT and low vit D making vit D deficiency as likely cause Recommend 1000 units daily, script sent to pharmacy  Encounter for screening mammogram for breast cancer Due for mammogram, given website and number for [facility] for mammogram, no breast masses or nipple discharge  Given Address to [facility] for flu, COVID, hep B vaccines.   Billey Co, MD [facility]

## 2023-09-13 ENCOUNTER — Encounter: Payer: Self-pay | Admitting: Family Medicine

## 2023-09-13 ENCOUNTER — Ambulatory Visit (INDEPENDENT_AMBULATORY_CARE_PROVIDER_SITE_OTHER): Payer: Self-pay | Admitting: Family Medicine

## 2023-09-13 VITALS — BP 124/70 | HR 73 | Temp 98.1°F | Wt 222.4 lb

## 2023-09-13 DIAGNOSIS — S0990XA Unspecified injury of head, initial encounter: Secondary | ICD-10-CM

## 2023-09-13 DIAGNOSIS — R748 Abnormal levels of other serum enzymes: Secondary | ICD-10-CM

## 2023-09-13 DIAGNOSIS — Z01419 Encounter for gynecological examination (general) (routine) without abnormal findings: Secondary | ICD-10-CM

## 2023-09-13 DIAGNOSIS — E559 Vitamin D deficiency, unspecified: Secondary | ICD-10-CM

## 2023-09-13 DIAGNOSIS — Z1231 Encounter for screening mammogram for malignant neoplasm of breast: Secondary | ICD-10-CM

## 2023-09-13 DIAGNOSIS — Z1211 Encounter for screening for malignant neoplasm of colon: Secondary | ICD-10-CM

## 2023-09-13 MED ORDER — VITAMIN D 25 MCG (1000 UNIT) PO TABS: 1000.0000 [IU] | ORAL_TABLET | Freq: Every day | ORAL | 1 refills | Status: DC

## 2023-09-14 ENCOUNTER — Other Ambulatory Visit: Payer: Self-pay | Admitting: Family Medicine

## 2023-09-14 ENCOUNTER — Encounter: Payer: Self-pay | Admitting: Family Medicine

## 2023-09-14 DIAGNOSIS — R748 Abnormal levels of other serum enzymes: Secondary | ICD-10-CM

## 2023-09-14 LAB — ACUTE HEP PANEL AND HEP B SURFACE AB
Hep A IgM: NEGATIVE
Hep B C IgM: NEGATIVE
Hep C Virus Ab: NONREACTIVE
Hepatitis B Surf Ab Quant: <3.5 m[IU]/mL — ABNORMAL LOW
Hepatitis B Surface Ag: NEGATIVE

## 2023-09-14 NOTE — Assessment & Plan Note (Signed)
Elevated alk phos with normal GGT and low vit D making vit D deficiency as likely cause Recommend 1000 units daily, script sent to pharmacy

## 2023-09-20 LAB — SPECIMEN STATUS REPORT

## 2023-09-22 ENCOUNTER — Other Ambulatory Visit: Payer: Self-pay

## 2023-09-22 MED ORDER — INFLUENZA VIRUS VACC SPLIT PF (FLUZONE) 0.5 ML IM SUSY
0.5000 mL | PREFILLED_SYRINGE | Freq: Once | INTRAMUSCULAR | 0 refills | Status: AC
  Filled 2023-09-22: qty 0.5, 1d supply, fill #0

## 2023-09-23 LAB — ACUTE HEP PANEL AND HEP B SURFACE AB

## 2023-09-25 ENCOUNTER — Other Ambulatory Visit: Payer: Self-pay

## 2023-09-26 ENCOUNTER — Other Ambulatory Visit: Payer: Self-pay | Admitting: Family Medicine

## 2023-09-26 DIAGNOSIS — E1169 Type 2 diabetes mellitus with other specified complication: Secondary | ICD-10-CM

## 2023-09-26 NOTE — Telephone Encounter (Signed)
Per Dr. Berneta Levins last note, she is to follow up in 1 month. Nursing please call to schedule. Medication refilled.  Terisa Starr, MD  Family Medicine Teaching Service

## 2023-09-27 NOTE — Telephone Encounter (Signed)
Called patient using the [employer] ID: [id number] to inform her about her medication refill and to inform her that Dr. Miquel Dunn would like to see her in one month.  Patient was fine with the date 10/20/2023 at 9:50am.  Drusilla Kanner, CMA

## 2023-10-19 NOTE — Progress Notes (Deleted)
    SUBJECTIVE:   CHIEF COMPLAINT / HPI:   T2DM- tried to restart Trulicity 3mg  weekly, was she able to pick up?  PERTINENT  PMH / PSH: ***  OBJECTIVE:   LMP  (LMP Unknown)   General: A&O, NAD HEENT: No sign of trauma, EOM grossly intact Cardiac: RRR, no m/r/g Respiratory: CTAB, normal WOB, no w/c/r GI: Soft, NTTP, non-distended  Extremities: NTTP, no peripheral edema. Neuro: Normal gait, moves all four extremities appropriately. Psych: Appropriate mood and affect   ASSESSMENT/PLAN:   Assessment & Plan      Billey Co, MD [facility]

## 2023-10-20 ENCOUNTER — Ambulatory Visit: Payer: Self-pay | Admitting: Family Medicine

## 2023-10-30 ENCOUNTER — Other Ambulatory Visit: Payer: Self-pay

## 2023-10-30 DIAGNOSIS — E1169 Type 2 diabetes mellitus with other specified complication: Secondary | ICD-10-CM

## 2023-10-30 MED ORDER — EZETIMIBE 10 MG PO TABS: 10.0000 mg | ORAL_TABLET | Freq: Every day | ORAL | 0 refills | Status: DC

## 2023-11-03 ENCOUNTER — Other Ambulatory Visit: Payer: Self-pay

## 2023-11-28 ENCOUNTER — Other Ambulatory Visit: Payer: Self-pay | Admitting: Family Medicine

## 2023-11-28 DIAGNOSIS — E1169 Type 2 diabetes mellitus with other specified complication: Secondary | ICD-10-CM

## 2023-12-28 ENCOUNTER — Encounter: Payer: Self-pay | Admitting: Family Medicine

## 2023-12-29 ENCOUNTER — Telehealth: Payer: Self-pay | Admitting: Family Medicine

## 2023-12-29 DIAGNOSIS — I152 Hypertension secondary to endocrine disorders: Secondary | ICD-10-CM

## 2023-12-29 DIAGNOSIS — E1169 Type 2 diabetes mellitus with other specified complication: Secondary | ICD-10-CM

## 2023-12-30 MED ORDER — LISINOPRIL 20 MG PO TABS: 20.0000 mg | ORAL_TABLET | Freq: Every day | ORAL | 0 refills | Status: DC

## 2023-12-30 MED ORDER — GABAPENTIN 300 MG PO CAPS: 300.0000 mg | ORAL_CAPSULE | Freq: Three times a day (TID) | ORAL | 0 refills | Status: DC

## 2023-12-30 MED ORDER — METFORMIN HCL 1000 MG PO TABS: 1000.0000 mg | ORAL_TABLET | Freq: Two times a day (BID) | ORAL | 0 refills | Status: DC

## 2023-12-30 NOTE — Telephone Encounter (Signed)
 Covering for Dr. Quentin Mulling in refills  Please have patient schedule follow up with any physician in our office for her diabetes  Thanks Latrelle Dodrill, MD

## 2024-01-04 ENCOUNTER — Encounter: Payer: Self-pay | Admitting: Family Medicine

## 2024-01-04 ENCOUNTER — Ambulatory Visit (INDEPENDENT_AMBULATORY_CARE_PROVIDER_SITE_OTHER): Payer: Self-pay | Admitting: Family Medicine

## 2024-01-04 ENCOUNTER — Other Ambulatory Visit: Payer: Self-pay

## 2024-01-04 VITALS — BP 144/76 | HR 64 | Ht 64.0 in | Wt 224.2 lb

## 2024-01-04 DIAGNOSIS — I10 Essential (primary) hypertension: Secondary | ICD-10-CM

## 2024-01-04 DIAGNOSIS — E669 Obesity, unspecified: Secondary | ICD-10-CM

## 2024-01-04 DIAGNOSIS — H9202 Otalgia, left ear: Secondary | ICD-10-CM

## 2024-01-04 DIAGNOSIS — Z7985 Long-term (current) use of injectable non-insulin antidiabetic drugs: Secondary | ICD-10-CM

## 2024-01-04 LAB — POCT GLYCOSYLATED HEMOGLOBIN (HGB A1C): HbA1c, POC (controlled diabetic range): 11.2 % — AB (ref 0.0–7.0)

## 2024-01-04 MED ORDER — INSULIN GLARGINE 100 UNIT/ML SOLOSTAR PEN
10.0000 [IU] | PEN_INJECTOR | Freq: Every day | SUBCUTANEOUS | 11 refills | Status: DC
  Filled 2024-01-04: qty 3, 30d supply, fill #0
  Filled 2024-02-09 (×2): qty 3, 30d supply, fill #1
  Filled 2024-03-07 (×2): qty 3, 30d supply, fill #2
  Filled 2024-04-05 (×2): qty 3, 30d supply, fill #3
  Filled 2024-06-03 (×2): qty 3, 30d supply, fill #4
  Filled 2024-08-15 (×2): qty 3, 30d supply, fill #5
  Filled 2024-09-05 – 2024-11-04 (×2): qty 3, 30d supply, fill #6

## 2024-01-04 MED ORDER — INSULIN PEN NEEDLE 31G X 8 MM MISC
1.0000 | Freq: Every day | 3 refills | Status: AC | PRN
  Filled 2024-01-04: qty 100, 100d supply, fill #0
  Filled 2024-04-05: qty 100, 100d supply, fill #1
  Filled 2024-11-04: qty 100, 100d supply, fill #2

## 2024-01-04 NOTE — Progress Notes (Signed)
 Asked by Dr. Sherrilee Gilles to evaluate patient for possible enrollment in CGM study - Liberate.   Patient educated on purpose, proper use and potential benefits of CGM.   Following instruction patient verbalized she was uninterested in use of CGM a this time.  Patient was offered to follow-up and she is scheduled to see me 3/11 in pharmacist clinic.   Plan to review home CBG readings and adjust insulin restarted today by Dr. Sherrilee Gilles at that visit.

## 2024-01-04 NOTE — Patient Instructions (Addendum)
 Me alegro de verte hoy. Gracias por venir.  Lo que hablamos hoy:  1) Tu diabetes est mejorando, pero sigue alta. Tu A1c hoy es 11,2. Nuestro objetivo es que sea menor de 7. Ests haciendo un buen [place], pero creo que necesitaremos empezar a administrarte insulina para ayudarte con tus niveles de International aid/development worker. Si tu diabetes est bien controlada en el futuro, podemos dejar de administrarte insulina. - Todas las [place], controla tu nivel de azcar en sangre antes de comer o beber cualquier cosa Administrator. - Comienza a tomar insulina Lantus 10 unidades al da por la [place]. - Si tu nivel de azcar por la maana es menor de 70, no te administres insulina y Armed forces logistics/support/administrative officer. - Te pedir que veas al Dr. Raymondo Band para ayudarte a optimizar tu rgimen de diabetes.  2) Tu presin arterial estaba un poco alta hoy. Vuelve en las prximas semanas para que te la vuelvan a Chief Operating Officer.  3) Tenas un poco de cerumen en el odo izquierdo, pero no pareca infectado. Vuelve si el dolor empeora.     Good to see you today - Thank you for coming in  Things we discussed today:  1) Your diabetes is improving but still high. Your A1c today is 11.2. Our goal is to get it less than 7. You are doing a good job, but I think we will need to start insulin to help with your sugars. If your diabetes is well controlled in the future, we can stop the insulin. - Every morning, check your blood sugar before you eat or drink anything for the day. - Start taking Lantus insulin 10 units a day in the morning - If your morning sugar is less than 70, then do not give your insulin and let us know. - I will have you see Dr. Raymondo Band to help optimize your diabetes regimen  2) Your blood pressure was a little high today. Come back within the next few weeks to get this rechecked.  3) You had some earwax in your Left ear, but it did not look infected. Please come back if the pain is worsening.

## 2024-01-04 NOTE — Progress Notes (Signed)
    SUBJECTIVE:   CHIEF COMPLAINT / HPI:   VM is a 58yo F w/ hx of T2DM, HTN, GERD that presents for diabetes follow-up  T2DM - Feels like her A1c will be slighly lower today - Reports taking Trulicity 3mg  weekly - Reports sometimes just taking the metformin once a day, rather than twice daily. - Reports having issues with remembering her meds if she gets busy - Has used insulin before for gestational diabetes.  - She is worried about starting insulin  - has a glucometer, does not need refills of lancet or strips - Not regularly checking glucose.   - Mother had diabetes, passed away from sepsis in setting of uncontrolled diabetes.   - Goes for walks daily. Also wanting to work on her diet and feels like this is contributing to her uncontrolled diabetes.  - Feels like her [place] cuisine is high in carbs.   L ear discomfort - reports some burning sensation in her L ear and wanted to get it looked at   OBJECTIVE:   BP (!) 145/78   Pulse 64   Ht 5\' 4"  (1.626 m)   Wt 224 lb 3.2 oz (101.7 kg)   LMP  (LMP Unknown)   SpO2 99%   BMI 38.48 kg/m   General: Alert, pleasant well-appearing woman. NAD. HEENT: NCAT. MMM. L TM pearly, nonerythematous; some cerumen noted in ear canal. CV: RRR, no murmurs. Resp: CTAB, no wheezing or crackles. Normal WOB on RA.  Abm: Soft, nontender, nondistended. BS present. Ext: Moves all ext spontaneously Skin: Warm, well perfused    ASSESSMENT/PLAN:   Assessment & Plan Type 2 diabetes mellitus with obesity (HCC) A1C improving, but still uncontrolled.  Cannot increase Trulicity given GI side effects.  At this point, we will need to start insulin to help control A1c (maxxed on metformin, A1c too high for SGLT). Pt is hesitant to start insulin, potentially due to inconvenience.  Reassured patient that she not have to be on insulin forever, but it will be helpful currently to help control her diabetes. - Start lantus 10u daily.  Counseled on  hypoglycemia precautions. - Check glucometer fasting daily, bring to next visit - f/u in 1 wk w/ Dr. Raymondo Band  - Cont metformin and trulicity - Consider dietician referral at next visit to help with diet management of diabetes Primary hypertension Elevated at today's visit.  Advised to follow-up in next few weeks to have BP rechecked Left ear pain May be due to cerumen irritation.  No signs of infection on exam.  Removed cerumen during ear exam with curette.   Lincoln Brigham, MD [facility]

## 2024-01-05 NOTE — Assessment & Plan Note (Signed)
 A1C improving, but still uncontrolled.  Cannot increase Trulicity given GI side effects.  At this point, we will need to start insulin to help control A1c (maxxed on metformin, A1c too high for SGLT). Pt is hesitant to start insulin, potentially due to inconvenience.  Reassured patient that she not have to be on insulin forever, but it will be helpful currently to help control her diabetes. - Start lantus 10u daily.  Counseled on hypoglycemia precautions. - Check glucometer fasting daily, bring to next visit - f/u in 1 wk w/ Dr. Raymondo Band  - Cont metformin and trulicity - Consider dietician referral at next visit to help with diet management of diabetes

## 2024-01-08 ENCOUNTER — Other Ambulatory Visit: Payer: Self-pay

## 2024-01-09 ENCOUNTER — Ambulatory Visit (INDEPENDENT_AMBULATORY_CARE_PROVIDER_SITE_OTHER): Payer: Self-pay | Admitting: Pharmacist

## 2024-01-09 ENCOUNTER — Encounter: Payer: Self-pay | Admitting: Pharmacist

## 2024-01-09 VITALS — Wt 218.6 lb

## 2024-01-09 DIAGNOSIS — E669 Obesity, unspecified: Secondary | ICD-10-CM

## 2024-01-09 DIAGNOSIS — E1169 Type 2 diabetes mellitus with other specified complication: Secondary | ICD-10-CM

## 2024-01-09 NOTE — Assessment & Plan Note (Signed)
 Diabetes longstanding currently uncontrolled. Patient is able to verbalize appropriate hypoglycemia management plan. Medication adherence appears good, prepared to begin administration of insulin today, -Continued basal insulin Basaglar (insulin glargine) 10 units daily in the morning.  -Continued GLP-1 Trulicity (dulaglutide).  -Continued metformin -Patient counseled on proper administration of Basaglar -Extensively discussed pathophysiology of diabetes, recommended lifestyle interventions, dietary effects on blood sugar control.  -Counseled on s/sx of and management of hypoglycemia.

## 2024-01-09 NOTE — Progress Notes (Signed)
 S:     Chief Complaint  Patient presents with   Medication Management    DM f/u   58 y.o. female who presents for diabetes evaluation, education, and management. Patient arrives in good spirits and presents without any assistance.  Visit was completed with assistance of video interpreter, Christian.   Patient was referred and last seen by Primary Care Provider, Dr. Sherrilee Gilles, on 01/04/24.   PMH is significant for T2DM, HTN, GERD.  At last visit, patient was initiated on Lantus 10 units daily. Patient arrives today for insulin injection training.  Majority of visit was spent in education and administration of first dose.   Family/Social History: mother (passed away from sepsis in setting of uncontrolled diabetes)  Current diabetes medications include: dulaglutide (Trulicity) 3mg ,metformin 1000 mg BID, and planned:  insulin glargine (Basaglar) 10 units daily Current hypertension medications include: lisinopril Current hyperlipidemia medications include: ezetimibe  Patient reports adherence to taking most medications as prescribed. Patient was prescribed insulin on 3/6 but has not been taking it due to confusion on administration.  Patient denies hypoglycemic events.  Reported home average blood sugars: 169 mg/dL. Patient reports taking blood sugars about 2-3 times a day, encouraged to take upwards to 3-4 times a day while first initiating on insulin glargine.  Patient reported dietary habits: Eats 3 meals/day, 2pm largest meal Counseled on eating earlier in the day Counseled on eating 4-5 hrs after previous meal  O:   Review of Systems  All other systems reviewed and are negative.   Physical Exam Vitals reviewed.  Constitutional:      Appearance: Normal appearance.  Pulmonary:     Effort: Pulmonary effort is normal.  Neurological:     Mental Status: She is alert.  Psychiatric:        Mood and Affect: Mood normal.        Behavior: Behavior normal.        Thought Content:  Thought content normal.        Judgment: Judgment normal.    Lab Results  Component Value Date   HGBA1C 11.2 (A) 01/04/2024    Lipid Panel     Component Value Date/Time   CHOL 172 10/17/2022 1153   TRIG 252 (H) 10/17/2022 1153   HDL 45 10/17/2022 1153   CHOLHDL 3.8 10/17/2022 1153   CHOLHDL 3.2 10/14/2016 0835   VLDL 33 (H) 10/14/2016 0835   LDLCALC 85 10/17/2022 1153   LDLDIRECT 108 (H) 03/12/2014 1002    Clinical Atherosclerotic Cardiovascular Disease (ASCVD): No  The 10-year ASCVD risk score (Arnett DK, et al., 2019) is: 8%   Values used to calculate the score:     Age: 89 years     Sex: Female     Is Non-Hispanic African American: No     Diabetic: Yes     Tobacco smoker: No     Systolic Blood Pressure: 144 mmHg     Is BP treated: Yes     HDL Cholesterol: 45 mg/dL     Total Cholesterol: 172 mg/dL   A/P: Diabetes longstanding currently uncontrolled. Patient is able to verbalize appropriate hypoglycemia management plan. Medication adherence appears good, prepared to begin administration of insulin today, -Continued basal insulin Basaglar (insulin glargine) 10 units daily in the morning.  -Continued GLP-1 Trulicity (dulaglutide).  -Continued metformin -Patient counseled on proper administration of Basaglar -Extensively discussed pathophysiology of diabetes, recommended lifestyle interventions, dietary effects on blood sugar control.  -Counseled on s/sx of and management of hypoglycemia.  -  Next A1c anticipated June 2025.   Written patient instructions provided. Patient verbalized understanding of treatment plan.  Total time in face to face counseling 57 minutes.    Follow-up:  Pharmacist Dr. Raymondo Band PCP clinic visit in 01/18/24 Patient seen with Threasa Heads, PharmD Candidate and Mack Guise, PharmD Candidate.

## 2024-01-09 NOTE — Patient Instructions (Addendum)
  Qu bueno verte hoy!  Tu nivel de Production assistant, radio ideal es de 80 a 130 antes de comer y menos de 180 despus de comer.  Cambios en la medicacin: Inyecta 10 unidades de Hospital doctor (insulina glargina) al da.  Contina con el resto de la medicacin de la misma forma.   Controla el nivel de Production assistant, radio en casa y lleva un registro (glucmetro o papel) para llevar contigo a tu prxima visita.  Sigue con el buen trabajo con la dieta y el ejercicio. Intenta llevar una dieta rica en verduras, frutas y carnes magras (pollo, pavo, pescado). Intenta limitar la ingesta de sal comiendo verduras frescas o congeladas (en lugar de enlatadas), enjuaga las verduras enlatadas antes de cocinarlas y no aadas sal adicional a las comidas.  It was nice to see you today!  Your goal blood sugar is 80-130 before eating and less than 180 after eating.  Medication Changes: Inject Basaglar (insulin glargine) 10 units daily.  Continue all other medication the same.   Monitor blood sugars at home and keep a log (glucometer or piece of paper) to bring with you to your next visit.  Keep up the good work with diet and exercise. Aim for a diet full of vegetables, fruit and lean meats (chicken, [place], fish). Try to limit salt intake by eating fresh or frozen vegetables (instead of canned), rinse canned vegetables prior to cooking and do not add any additional salt to meals.

## 2024-01-12 NOTE — Progress Notes (Signed)
 Reviewed and agree with Dr Macky Lower plan.

## 2024-01-18 ENCOUNTER — Ambulatory Visit (INDEPENDENT_AMBULATORY_CARE_PROVIDER_SITE_OTHER): Payer: Self-pay | Admitting: Pharmacist

## 2024-01-18 ENCOUNTER — Encounter: Payer: Self-pay | Admitting: Pharmacist

## 2024-01-18 VITALS — BP 122/69 | HR 71 | Wt 217.8 lb

## 2024-01-18 DIAGNOSIS — Z862 Personal history of diseases of the blood and blood-forming organs and certain disorders involving the immune mechanism: Secondary | ICD-10-CM

## 2024-01-18 DIAGNOSIS — E559 Vitamin D deficiency, unspecified: Secondary | ICD-10-CM

## 2024-01-18 DIAGNOSIS — E1169 Type 2 diabetes mellitus with other specified complication: Secondary | ICD-10-CM

## 2024-01-18 DIAGNOSIS — E669 Obesity, unspecified: Secondary | ICD-10-CM

## 2024-01-18 MED ORDER — FERROUS SULFATE 325 (65 FE) MG PO TABS: 325.0000 mg | ORAL_TABLET | ORAL | 2 refills | Status: AC

## 2024-01-18 MED ORDER — VITAMIN D 25 MCG (1000 UNIT) PO TABS: 1000.0000 [IU] | ORAL_TABLET | Freq: Every day | ORAL | 1 refills | Status: AC

## 2024-01-18 MED ORDER — VITAMIN D (ERGOCALCIFEROL) 1.25 MG (50000 UNIT) PO CAPS: 50000.0000 [IU] | ORAL_CAPSULE | ORAL | 0 refills | Status: AC

## 2024-01-18 NOTE — Patient Instructions (Addendum)
  Qu bueno verte hoy!  Tu nivel ideal de azcar en sangre es de 80 a 130 antes de comer y menos de 180 despus de comer.  Cambios de medicacin:  Contina con todos tus dems medicamentos.  Controla tu azcar en sangre en casa y lleva un registro (glucmetro o papel) para llevar a tu prxima consulta.  Sigue con la buena alimentacin y el ejercicio. Intenta llevar una dieta rica en verduras, frutas y carnes magras (pollo, pavo, pescado). Intenta limitar el consumo de sal comiendo verduras frescas o congeladas (en lugar de enlatadas), enjuaga las verduras enlatadas antes de cocinarlas y no aadas sal a las comidas.   It was nice to see you today!  Your goal blood sugar is 80-130 before eating and less than 180 after eating.  Medication Changes:  Continue all other medication the same.   Monitor blood sugars at home and keep a log (glucometer or piece of paper) to bring with you to your next visit.  Keep up the good work with diet and exercise. Aim for a diet full of vegetables, fruit and lean meats (chicken, [place], fish). Try to limit salt intake by eating fresh or frozen vegetables (instead of canned), rinse canned vegetables prior to cooking and do not add any additional salt to meals.

## 2024-01-18 NOTE — Progress Notes (Signed)
 S:     Chief Complaint  Patient presents with   Medication Management    Diabetes Follow Up   58 y.o. female who presents for diabetes evaluation, education, and management. Patient arrives in good spirits and presents without any assistance. A translator, Bernestine Amass #[id number] was used to complete the visit.  Patient was referred and last seen by me on 01/09/2024.  PMH is significant for HTN, T2DM.  At last visit, patient was started on Latnus (insulin glargine) 10 units daily and instructed on how to use insulin pen.   Family/Social History: Mother passed away from sepsis in the setting of uncontrolled T2DM  Current diabetes medications include: Trulicity (dulaglutide) 3 mg weekly, Lantus (insulin glargine) 10 units daily, metformin 1000 mg bid Current hypertension medications include: lisinopril 20 mg daily Current hyperlipidemia medications include: ezetimibe 10 mg daily  Patient reports adherence to taking all medications as prescribed.   Do you feel that your medications are working for you? yes   Patient denies hypoglycemic events.  Reported home fasting blood sugars: Range 111-140s  Reported 2 hour post-meal/random blood sugars: Range 150-220s.  Glycemic control has improved and all values since this Monday (01/15/24) has been within target range. The patient has taken her blood sugar 3-4 times a day.  O:   Review of Systems  All other systems reviewed and are negative.  Physical Exam Vitals reviewed.  Constitutional:      Appearance: Normal appearance.  Pulmonary:     Effort: Pulmonary effort is normal.  Neurological:     Mental Status: She is alert.  Psychiatric:        Mood and Affect: Mood normal.        Thought Content: Thought content normal.        Judgment: Judgment normal.    Lab Results  Component Value Date   HGBA1C 11.2 (A) 01/04/2024   Vitals:   01/18/24 0943  BP: 122/69  Pulse: 71  SpO2: 98%    Lipid Panel     Component Value  Date/Time   CHOL 172 10/17/2022 1153   TRIG 252 (H) 10/17/2022 1153   HDL 45 10/17/2022 1153   CHOLHDL 3.8 10/17/2022 1153   CHOLHDL 3.2 10/14/2016 0835   VLDL 33 (H) 10/14/2016 0835   LDLCALC 85 10/17/2022 1153   LDLDIRECT 108 (H) 03/12/2014 1002    Clinical Atherosclerotic Cardiovascular Disease (ASCVD): No  The 10-year ASCVD risk score (Arnett DK, et al., 2019) is: 5.8%   Values used to calculate the score:     Age: 16 years     Sex: Female     Is Non-Hispanic African American: No     Diabetic: Yes     Tobacco smoker: No     Systolic Blood Pressure: 122 mmHg     Is BP treated: Yes     HDL Cholesterol: 45 mg/dL     Total Cholesterol: 172 mg/dL    A/P: Diabetes longstanding control is improved since last visit. Patient is able to verbalize appropriate hypoglycemia management plan. Medication adherence appears adherent.  -Continued basal insulin Lantus (insulin glargine) 10 units daily in the morning.  -Continued GLP-1 Trulicity (dulaglutide) 3 mg weekly -Continued metformin 1000 mg bid.  -Extensively discussed pathophysiology of diabetes, recommended lifestyle interventions, dietary effects on blood sugar control.  -Counseled on s/sx of and management of hypoglycemia.  -Next A1c anticipated 04/05/2024.   ASCVD risk - primary prevention in patient with diabetes. Last LDL is 85 not at  goal of <70 mg/dL. ASCVD risk factors include HTN, T2DM and 10-year ASCVD risk score of 5.8%. moderate intensity statin indicated.  -Reported statin intolerance -Continued ezetimibe 10 mg daily  Hypertension longstanding currently controlled. Blood pressure goal of <130/80 mmHg. Medication adherence reports adherence.  -Continued lisinopril 20 mg daily  History of Vitamin D deficiency 08/2023 -  appears to have taking 50,000 units for four weeks followed 1000 units daily.   Patient reports adherence with daily Vitamin D.  Refilled daily 1000 Units once daily. Plan recheck of vitamin D in this  patient with Diabetes at upcoming PCP visit.   Iron deficiency anemia related to blood loss.  Chronic with normal Hgb 10./2024. Continue iron replacement at this time, 325mg  iron sulfate every other day.  Refill provided.    Written patient instructions provided. Patient verbalized understanding of treatment plan.  Total time in face to face counseling 17 minutes.    Follow-up:  PCP clinic visit in 03/04/2024 Pharmacist visit PRN in the future.  Patient seen with Mack Guise, PharmD Candidate.

## 2024-01-18 NOTE — Assessment & Plan Note (Signed)
 Diabetes longstanding control is improved since last visit. Patient is able to verbalize appropriate hypoglycemia management plan. Medication adherence appears adherent.  -Continued basal insulin Lantus (insulin glargine) 10 units daily in the morning.  -Continued GLP-1 Trulicity (dulaglutide) 3 mg weekly -Continued metformin 1000 mg bid.  -Extensively discussed pathophysiology of diabetes, recommended lifestyle interventions, dietary effects on blood sugar control.  -Counseled on s/sx of and management of hypoglycemia.  -Next A1c anticipated 04/05/2024.

## 2024-01-18 NOTE — Progress Notes (Signed)
 Reviewed and agree with Dr Macky Lower plan.

## 2024-01-25 ENCOUNTER — Other Ambulatory Visit: Payer: Self-pay | Admitting: Obstetrics and Gynecology

## 2024-01-25 DIAGNOSIS — Z1231 Encounter for screening mammogram for malignant neoplasm of breast: Secondary | ICD-10-CM

## 2024-02-06 ENCOUNTER — Other Ambulatory Visit: Payer: Self-pay

## 2024-02-08 ENCOUNTER — Other Ambulatory Visit: Payer: Self-pay

## 2024-02-09 ENCOUNTER — Other Ambulatory Visit: Payer: Self-pay

## 2024-02-27 ENCOUNTER — Ambulatory Visit (INDEPENDENT_AMBULATORY_CARE_PROVIDER_SITE_OTHER): Payer: Self-pay | Admitting: Student

## 2024-02-27 VITALS — BP 129/78 | HR 73 | Ht 64.0 in | Wt 224.0 lb

## 2024-02-27 DIAGNOSIS — M25562 Pain in left knee: Secondary | ICD-10-CM

## 2024-02-27 MED ORDER — NAPROXEN 500 MG PO TABS: 500.0000 mg | ORAL_TABLET | Freq: Two times a day (BID) | ORAL | 0 refills | Status: AC

## 2024-02-27 NOTE — Assessment & Plan Note (Signed)
 Chronic knee pain with difficulty ambulating. Previous vascular ultrasound negative for thrombosis per patient. Differential includes osteoarthritis, no injury or trauma to indicate fracture. Length of time of pain warrants xray  - Order knee X-ray to assess - Prescribe naproxen twice daily with food for 5 days, then as needed- no heart history, GIB history, normal cr last check - Refer to sports medicine specialist for further evaluation and potential injections. - Provide work excuse for last week and yesterday.

## 2024-02-27 NOTE — Patient Instructions (Addendum)
 It was great to see you today! Thank you for choosing [facility] for your primary care.  Today we addressed: We will refer to sports medicine  We will order the Xrays - [address] [facility]  Naproxen twice a day with food x 5 days then as needed  If you haven't already, sign up for My Chart to have easy access to your labs results, and communication with your primary care physician.   Please arrive 15 minutes before your appointment to ensure smooth check in process.  We appreciate your efforts in making this happen.  Thank you for allowing me to participate in your care, Ernestina Headland, MD 02/27/2024, 10:21 AM PGY-3, [facility]   Fue un placer verte hoy! Gracias por elegir [facility] para tu atencin primaria.  Hoy abordamos: 1. Te derivaremos a medicina deportiva. 2. Solicitaremos radiografas - [address] [employer]. 3. Naproxeno dos veces al da con alimentos durante 5 das y luego segn sea necesario.  Si an no lo has hecho, regstrate en My Chart para acceder fcilmente a tus resultados de laboratorio y comunicarte con tu mdico de cabecera.  Por favor, llega 15 minutos antes de tu cita para asegurar un registro sin problemas. Agradecemos tu esfuerzo.  Gracias por permitirme participar en tu atencin mdica. Ernestina Headland, MD 29/01/2024, 10:13 a. m. PGY-3, [facility]

## 2024-02-27 NOTE — Progress Notes (Signed)
    SUBJECTIVE:   CHIEF COMPLAINT / HPI:   Left Knee Pain:  - The patient presents with knee pain and difficulty walking.  Knee pain has been present for several months, with a pulling sensation in the back and pain in the front. The pain has worsened over the past two weeks, affecting mobility and daily activities, and causing missed work. An ultrasound showed no blood clots or circulation issues. The knee sometimes 'pops' when walking.  Pain is managed with gabapentin  and Advil , taken three tablets every eight hours as needed. There is a sensation of pressure in the back of the knee, and they cannot fully extend the leg when stepping.  They are on Trulicity  and insulin  for diabetes management.  PERTINENT  PMH / PSH: Reviewed   OBJECTIVE:   BP 129/78   Pulse 73   Ht 5\' 4"  (1.626 m)   Wt 224 lb (101.6 kg)   LMP  (LMP Unknown)   SpO2 98%   BMI 38.45 kg/m   General: Alert and oriented in no apparent distress Heart: Regular rate and rhythm with no murmurs appreciated Lungs: CTA bilaterally, no wheezing Abdomen: Bowel sounds present, no abdominal pain Skin: Warm and dry Extremities: No lower extremity edema Left Knee: limited extension, full flexion with mild crepitus, no ligamentous laxity, no swelling, pain in lateral joint line radiating posteriorly, good patellar tracking   ASSESSMENT/PLAN:   Assessment & Plan Acute pain of left knee Chronic knee pain with difficulty ambulating. Previous vascular ultrasound negative for thrombosis per patient. Differential includes osteoarthritis, no injury or trauma to indicate fracture. Length of time of pain warrants xray  - Order knee X-ray to assess - Prescribe naproxen twice daily with food for 5 days, then as needed- no heart history, GIB history, normal cr last check - Refer to sports medicine specialist for further evaluation and potential injections. - Provide work excuse for last week and yesterday.     Ernestina Headland,  MD [facility]

## 2024-02-28 ENCOUNTER — Ambulatory Visit
Admission: RE | Admit: 2024-02-28 | Discharge: 2024-02-28 | Disposition: A | Discharge status: Home / Self Care | Source: Ambulatory Visit | Attending: Family Medicine | Admitting: Family Medicine

## 2024-02-28 DIAGNOSIS — M25562 Pain in left knee: Secondary | ICD-10-CM

## 2024-03-03 ENCOUNTER — Encounter: Payer: Self-pay | Admitting: Student

## 2024-03-04 ENCOUNTER — Encounter: Payer: Self-pay | Admitting: Family Medicine

## 2024-03-04 ENCOUNTER — Ambulatory Visit (INDEPENDENT_AMBULATORY_CARE_PROVIDER_SITE_OTHER): Payer: Self-pay | Admitting: Family Medicine

## 2024-03-04 VITALS — BP 135/81 | HR 65 | Ht 64.0 in | Wt 221.4 lb

## 2024-03-04 DIAGNOSIS — E1169 Type 2 diabetes mellitus with other specified complication: Secondary | ICD-10-CM

## 2024-03-04 DIAGNOSIS — E559 Vitamin D deficiency, unspecified: Secondary | ICD-10-CM

## 2024-03-04 DIAGNOSIS — I10 Essential (primary) hypertension: Secondary | ICD-10-CM

## 2024-03-04 DIAGNOSIS — E669 Obesity, unspecified: Secondary | ICD-10-CM

## 2024-03-04 NOTE — Progress Notes (Signed)
    SUBJECTIVE:   CHIEF COMPLAINT / HPI:   T2DM - lantus  10 U, Trulicity  3mg  weekly, metformin  1000mg  BID. Not yet due for A1c. Checking blood sugars- at first her FPG were 110s and PP were 130s-140s. Last three weeks has had increased stress and helping her cousin and working extra and her FPG has been 130s and PP 140-160s. NO side effects from insulin . Did not take Trulicity  last week either as she ran out of time.  Vit D deficiency- recheck today. Finished the 50K international units weekly and wanting a recheck. Does feel tired.  HTN- taking her home medications, no side effects.   PERTINENT  PMH / PSH: T2DM, HTN, vit D deficiency  OBJECTIVE:   BP 135/81   Pulse 65   Ht 5\' 4"  (1.626 m)   Wt 221 lb 6.4 oz (100.4 kg)   LMP  (LMP Unknown)   SpO2 98%   BMI 38.00 kg/m   General: A&O, NAD HEENT: No sign of trauma, EOM grossly intact Cardiac: RRR, no m/r/g Respiratory: CTAB, normal WOB, no w/c/r GI: Soft, NTTP, non-distended  Extremities: NTTP, no peripheral edema. Neuro: Normal gait, moves all four extremities appropriately. Psych: Appropriate mood and affect   ASSESSMENT/PLAN:   Assessment & Plan Vitamin D  deficiency Recheck Vit D level today Type 2 diabetes mellitus with obesity (HCC) Due for A1c next month, based on BG levels suspect will be greatly improved, discussed compliance with Trulicity  as I suspect this is contributing to last weeks higher BG readings, when taking it BG was at goal so no changes to Lantus  at this time Primary hypertension At goal, Creatinine checked in last year     Charmel Cooter, MD [facility]

## 2024-03-04 NOTE — Patient Instructions (Addendum)
 It was wonderful to see you today.  Please bring ALL of your medications with you to every visit.   Today we talked about:  For your sleep, lets try melatonin 3 to 5 mg at night.   We will check your vitamin D  level today.  For your knee- we will get you an appointment with sports medicine.  Please ask the front to help you request a record of your knee X-ray.   Thank you for choosing [facility].   Please call [phone] with any questions about today's appointment.  Please arrive at least 15 minutes prior to your scheduled appointments.   If you had blood work today, I will send you a MyChart message or a letter if results are normal. Otherwise, I will give you a call.   If you had a referral placed, they will call you to set up an appointment. Please give us  a call if you don't hear back in the next 2 weeks.   If you need additional refills before your next appointment, please call your pharmacy first.   Avanell Bob, MD  Family Medicine

## 2024-03-04 NOTE — Assessment & Plan Note (Signed)
 Due for A1c next month, based on BG levels suspect will be greatly improved, discussed compliance with Trulicity  as I suspect this is contributing to last weeks higher BG readings, when taking it BG was at goal so no changes to Lantus  at this time

## 2024-03-04 NOTE — Assessment & Plan Note (Signed)
Recheck Vit D level today. 

## 2024-03-05 ENCOUNTER — Telehealth: Payer: Self-pay | Admitting: Family Medicine

## 2024-03-05 DIAGNOSIS — E559 Vitamin D deficiency, unspecified: Secondary | ICD-10-CM

## 2024-03-05 LAB — VITAMIN D 25 HYDROXY (VIT D DEFICIENCY, FRACTURES): Vit D, 25-Hydroxy: 19.2 ng/mL — ABNORMAL LOW (ref 30.0–100.0)

## 2024-03-05 NOTE — Telephone Encounter (Signed)
 Called patient with Spanish interpreter. Left HIPAA compliant VM to call back.  When patient calls back:  - Please tell her Vit D level 19.2, improved but not yet back to normal, would recommend taking 2000 international units of vitamin D  daily and we can recheck in a few months.  Avanell Bob MD

## 2024-03-07 ENCOUNTER — Other Ambulatory Visit: Payer: Self-pay

## 2024-03-08 ENCOUNTER — Other Ambulatory Visit: Payer: Self-pay

## 2024-03-26 ENCOUNTER — Other Ambulatory Visit: Payer: Self-pay | Admitting: Family Medicine

## 2024-03-26 DIAGNOSIS — E1169 Type 2 diabetes mellitus with other specified complication: Secondary | ICD-10-CM

## 2024-03-27 ENCOUNTER — Ambulatory Visit (INDEPENDENT_AMBULATORY_CARE_PROVIDER_SITE_OTHER): Payer: Self-pay | Admitting: Family Medicine

## 2024-03-27 ENCOUNTER — Other Ambulatory Visit: Payer: Self-pay | Admitting: Family Medicine

## 2024-03-27 ENCOUNTER — Encounter: Payer: Self-pay | Admitting: Family Medicine

## 2024-03-27 ENCOUNTER — Other Ambulatory Visit: Payer: Self-pay

## 2024-03-27 VITALS — BP 136/69 | Ht 64.0 in | Wt 228.0 lb

## 2024-03-27 DIAGNOSIS — M1712 Unilateral primary osteoarthritis, left knee: Secondary | ICD-10-CM | POA: Insufficient documentation

## 2024-03-27 DIAGNOSIS — T7840XA Allergy, unspecified, initial encounter: Secondary | ICD-10-CM

## 2024-03-27 DIAGNOSIS — M7122 Synovial cyst of popliteal space [Baker], left knee: Secondary | ICD-10-CM | POA: Insufficient documentation

## 2024-03-27 DIAGNOSIS — M25562 Pain in left knee: Secondary | ICD-10-CM

## 2024-03-27 DIAGNOSIS — E1169 Type 2 diabetes mellitus with other specified complication: Secondary | ICD-10-CM

## 2024-03-27 DIAGNOSIS — G8929 Other chronic pain: Secondary | ICD-10-CM

## 2024-03-27 DIAGNOSIS — E1159 Type 2 diabetes mellitus with other circulatory complications: Secondary | ICD-10-CM

## 2024-03-27 MED ORDER — METHYLPREDNISOLONE ACETATE 40 MG/ML IJ SUSP
40.0000 mg | Freq: Once | INTRAMUSCULAR | Status: AC
  Administered 2024-03-27: 40 mg via INTRA_ARTICULAR

## 2024-03-27 NOTE — Assessment & Plan Note (Addendum)
-   We discussed the patient's treatment options.  After joint decision-making she decided to proceed with an injection as below.  Hopefully this will give her some lasting relief. - She will start formal physical therapy and follow-up in 4 weeks.  Procedure: Corticosteroid injection of the Left knee Indications: pain 2/2 osteoarthritis Patient: Meredith Maynard 07-Jul-1966 Procedure date: 03/27/2024  Risks, benefits, and alternatives were discussed with the patient.  Verbal and written, informed consent obtained.  Time-out conducted.  Noted no overlying erythema, induration, or other signs of local infection.  Skin prepped in a sterile fashion using ethyl alcohol.  Local anesthesia: Topical Ethyl chloride  Needle: 22 ga 1/12" Injection: 4/2 mixture of Lidocaine  1% w/out epi and Kenalog  40 mg/mL With sterile technique the joint space was injected using the anterolateral approach.   Bandage was placed.   The procedure was completed without difficulty. Patient tolerated well and no immediate complications.  The patient reported improved pain suggesting accurate placement of the medication.  Advised to monitor for and call if fevers/chills, erythema, warmth, induration, drainage, or persistent bleeding.  Post procedural instructions were discussed and printed material given.

## 2024-03-27 NOTE — Assessment & Plan Note (Signed)
-   The patient's Baker's cyst is likely the cause of her difficulty with knee flexion. - Hopefully the injection will help resorption of the excess fluid. - In the meantime she can use a compression sleeve with ice, and physical therapy. - If she continues to have pain and decreased range of motion in 4 weeks we can aspirate the Baker's cyst but I did explain that these frequently recur.

## 2024-03-27 NOTE — Progress Notes (Signed)
 Meredith  Maynard - 58 y.o. female MRN [id number]  Date of birth: July 04, 1966  PCP: Charmel Cooter, MD  Subjective:  No chief complaint on file.  Left knee stiffness  HPI: Past Medical, Surgical, Social, and Family History Reviewed & Updated per EMR.   Patient is a 58 y.o. female here for restricted flexion in her left knee with difficulty walking for the last 2 months. She has had stiffness and discomfort for two years now but only had acute worsening of the stiffness. She denies any specific injuries but has a very active job doing cleaning. The discomfort is better at rest and worsened with walking. She denies any swelling, redness, warmth, numbness, or weakness.   Pertinent PMHx: T2DM  Past Surgical History:  Procedure Laterality Date   Abdominal U/S  2010   Other than fatty liver, normal   CESAREAN SECTION  2000,2004,2005   x3   Exercise Treadmill Test  2010   Normal, poor exercise tolerance   INCISIONAL HERNIA REPAIR N/A 01/04/2016   Procedure: RECURRENT INCISIONAL HERNIA;  Surgeon: Boyce Byes, MD;  Location: [facility] OR;  Service: General;  Laterality: N/A;   INSERTION OF MESH N/A 01/04/2016   Procedure: INSERTION OF MESH;  Surgeon: Boyce Byes, MD;  Location: [facility] OR;  Service: General;  Laterality: N/A;  PHASIX MESH   LAPAROTOMY N/A 01/04/2016   Procedure: EXPLORATORY LAPAROTOMY AND LYSIS OF ADHESIONS ;  Surgeon: Boyce Byes, MD;  Location: [facility] OR;  Service: General;  Laterality: N/A;   SMALL INTESTINE SURGERY  2007   Dr. Mammie Sears   TUBAL LIGATION     UMBILICAL HERNIA REPAIR  2007   Dr. Mammie Sears    Allergies  Allergen Reactions   Latex Rash        Objective:  Physical Exam: VS: BP:136/69  HR: bpm  TEMP: ( )  RESP:   HT:5\' 4"  (162.6 cm)   WT:228 lb (103.4 kg)  BMI:39.12  Gen: Well developed, NAD, speaks clearly, comfortable in exam room Respiratory: Normal work of breathing on room air, no respiratory distress Skin: No rashes, abrasions, or ecchymosis MSK:   Left Knee: Inspection: mild soft tissue edema ROM: 0-95 degrees, passive flexion to 110 elicits some discomfort and tightness is present Strength: 5/5  Palpation w/ no warmth, no effusion present, no posterior tenderness but fullness present on the medial aspect no joint line tenderness to palpation  no tenderness over Gerdy's tubercle, pes bursa, or tibial tuberosity no patellar tenderness, translation NT No condyle tenderness. Special Tests: Mcmurray's w/ popping but no pain Collateral ligaments: lachman's, varus and valgus stress tests negative Neuro: NVID  Ultrasound of the Left knee No effusion in the suprapatellar pouch Patellar and quadriceps tendons are normal Lateral meniscus is intact with Narrowing and osteophytes along the lateral joint line as well as some extrusion and mild hypoechoic change within the meniscus Medial meniscus is in tact w/ hyperechoic change within the fibers, joint line is narrowed with spurring  Posteriorly the vessels visualized and patent.  Hypoechoic fluid collection present medially at the posterior knee with a hyperechoic change in the middle  Summary: Old degenerative tear of medial meniscus and possible partial tear of the lateral aspect. Baker's cyst with free floating soft tissue  Ultrasound and interpretation by Dr. Dannis Dy and Dr. Londell River     Assessment & Plan:   Osteoarthritis of left knee - We discussed the patient's treatment options.  After joint decision-making she decided to proceed with an injection as below.  Hopefully this  will give her some lasting relief. - She will start formal physical therapy and follow-up in 4 weeks.  Procedure: Corticosteroid injection of the Left knee Indications: pain 2/2 osteoarthritis Patient: Meredith  Maynard [birth year] Procedure date: 03/27/2024  Risks, benefits, and alternatives were discussed with the patient.  Verbal and written, informed consent obtained.  Time-out conducted.   Noted no overlying erythema, induration, or other signs of local infection.  Skin prepped in a sterile fashion using ethyl alcohol.  Local anesthesia: Topical Ethyl chloride  Needle: 22 ga 1/12" Injection: 4/2 mixture of Lidocaine  1% w/out epi and Kenalog  40 mg/mL With sterile technique the joint space was injected using the anterolateral approach.   Bandage was placed.   The procedure was completed without difficulty. Patient tolerated well and no immediate complications.  The patient reported improved pain suggesting accurate placement of the medication.  Advised to monitor for and call if fevers/chills, erythema, warmth, induration, drainage, or persistent bleeding.  Post procedural instructions were discussed and printed material given.    Baker's cyst of knee, left - The patient's Baker's cyst is likely the cause of her difficulty with knee flexion. - Hopefully the injection will help resorption of the excess fluid. - In the meantime she can use a compression sleeve with ice, and physical therapy. - If she continues to have pain and decreased range of motion in 4 weeks we can aspirate the Baker's cyst but I did explain that these frequently recur.    Berneda Bridges MD [facility] Fellow

## 2024-03-28 ENCOUNTER — Ambulatory Visit
Admission: RE | Admit: 2024-03-28 | Discharge: 2024-03-28 | Disposition: A | Discharge status: Home / Self Care | Payer: Self-pay | Source: Ambulatory Visit | Attending: Obstetrics and Gynecology | Admitting: Obstetrics and Gynecology

## 2024-03-28 ENCOUNTER — Ambulatory Visit: Payer: Self-pay | Admitting: *Deleted

## 2024-03-28 VITALS — BP 129/73 | Wt 224.0 lb

## 2024-03-28 DIAGNOSIS — Z1231 Encounter for screening mammogram for malignant neoplasm of breast: Secondary | ICD-10-CM

## 2024-03-28 DIAGNOSIS — Z1239 Encounter for other screening for malignant neoplasm of breast: Secondary | ICD-10-CM

## 2024-03-28 DIAGNOSIS — Z1211 Encounter for screening for malignant neoplasm of colon: Secondary | ICD-10-CM

## 2024-03-28 NOTE — Patient Instructions (Signed)

## 2024-03-28 NOTE — Progress Notes (Signed)
 Ms. Valborg Friar is a 58 y.o. female who presents to [facility] clinic today with no complaints.    Pap Smear: Pap smear not completed today. Last Pap smear was 09/29/2021 at [facility] clinic and was normal with negative HPV. Per patient has no history of an abnormal Pap smear. Last Pap smear result is available in Epic.   Physical exam: Breasts Breasts symmetrical. No skin abnormalities bilateral breasts. No nipple retraction bilateral breasts. No nipple discharge bilateral breasts. No lymphadenopathy. No lumps palpated bilateral breasts. No complaints of pain or tenderness on exam.      MS DIGITAL SCREENING TOMO BILATERAL Result Date: 02/10/2020 CLINICAL DATA:  Screening. EXAM: DIGITAL SCREENING BILATERAL MAMMOGRAM WITH TOMO AND CAD COMPARISON:  Previous exam(s). ACR Breast Density Category b: There are scattered areas of fibroglandular density. FINDINGS: There are no findings suspicious for malignancy. Images were processed with CAD. IMPRESSION: No mammographic evidence of malignancy. A result letter of this screening mammogram will be mailed directly to the patient. RECOMMENDATION: Screening mammogram in one year. (Code:SM-B-01Y) BI-RADS CATEGORY  1: Negative. Electronically Signed   By: Serena  Chacko M.D.   On: 02/10/2020 16:03    Pelvic/Bimanual Pap is not indicated today per BCCCP guidelines.   Smoking History: Patient has never smoked.   Patient Navigation: Patient education provided. Access to services provided for patient through [facility] program. Spanish interpreter Barbette Boom from [facility] provided.   Colorectal Cancer Screening: Per patient has never had colonoscopy completed. FIT Test given to patient to complete. No complaints today.    Breast and Cervical Cancer Risk Assessment: Patient does not have family history of breast cancer, known genetic mutations, or radiation treatment to the chest before age 78. Patient does not have history of cervical  dysplasia, immunocompromised, or DES exposure in-utero.  Risk Scores as of Encounter on 03/28/2024     Gregary Lean           5-year 1.05%   Lifetime 7.06%            Last calculated by Silas, Ansyi K, CMA on 03/28/2024 at 12:51 PM        A: BCCCP exam without pap smear No complaints.  P: Referred patient to the [facility] for a screening mammogram on mobile unit. Appointment scheduled Thursday, Mar 28, 2024 at 1330.  Stefan Edge, RN 03/28/2024 12:58 PM

## 2024-03-28 NOTE — Patient Instructions (Signed)
 Explained breast self awareness with Meredith  Maynard. Patient did not need a Pap smear today due to last Pap smear and HPV typing was 09/29/2021. Let her know BCCCP will cover Pap smears and HPV typing every 5 years unless has a history of abnormal Pap smears. Referred patient to the [facility] for a screening mammogram on mobile unit. Appointment scheduled Thursday, Mar 28, 2024 at 1330. Patient aware of appointment and will be there. Let patient know the Breast Center will follow up with her within the next couple weeks with results of their mammogram by letter or phone. Kennie  Maynard verbalized understanding.  Chaelyn Bunyan, Dela Favor, RN 12:58 PM

## 2024-04-04 ENCOUNTER — Other Ambulatory Visit: Payer: Self-pay

## 2024-04-04 ENCOUNTER — Other Ambulatory Visit: Payer: Self-pay | Admitting: Family Medicine

## 2024-04-04 DIAGNOSIS — E1169 Type 2 diabetes mellitus with other specified complication: Secondary | ICD-10-CM

## 2024-04-04 MED ORDER — TRULICITY 3 MG/0.5ML ~~LOC~~ SOAJ
3.0000 mg | SUBCUTANEOUS | 3 refills | Status: DC
  Filled 2024-04-04: qty 2, 28d supply, fill #0
  Filled 2024-06-03 (×2): qty 2, 28d supply, fill #1
  Filled 2024-07-22 (×2): qty 2, 28d supply, fill #2
  Filled 2024-09-17 (×2): qty 2, 28d supply, fill #3

## 2024-04-05 ENCOUNTER — Other Ambulatory Visit: Payer: Self-pay

## 2024-04-11 ENCOUNTER — Ambulatory Visit: Payer: Self-pay | Admitting: Physical Therapy

## 2024-04-11 ENCOUNTER — Encounter: Payer: Self-pay | Admitting: Physical Therapy

## 2024-04-11 ENCOUNTER — Other Ambulatory Visit: Payer: Self-pay

## 2024-04-11 ENCOUNTER — Ambulatory Visit: Payer: Self-pay | Attending: Family Medicine | Admitting: Physical Therapy

## 2024-04-11 DIAGNOSIS — M6281 Muscle weakness (generalized): Secondary | ICD-10-CM | POA: Insufficient documentation

## 2024-04-11 DIAGNOSIS — M25562 Pain in left knee: Secondary | ICD-10-CM | POA: Insufficient documentation

## 2024-04-11 DIAGNOSIS — M1712 Unilateral primary osteoarthritis, left knee: Secondary | ICD-10-CM | POA: Insufficient documentation

## 2024-04-11 NOTE — Therapy (Signed)
 OUTPATIENT PHYSICAL THERAPY LOWER EXTREMITY EVALUATION  Patient Name: Meredith Maynard MRN: [id number] DOB:02/17/1966, 58 y.o., female Today's Date: 04/11/2024   PT End of Session - 04/11/24 1047     Visit Number 1    Number of Visits --   1-2x/week   Date for PT Re-Evaluation 06/06/24    Authorization Type Self pay    PT Start Time 0808    PT Stop Time 0845    PT Time Calculation (min) 37 min          Past Medical History:  Diagnosis Date   Anxiety    ANXIETY 12/28/2006   Qualifier: Diagnosis of  By: Eleni Griffin     BPPV (benign paroxysmal positional vertigo) 02/02/2016   Fatigue 04/24/2013   Fatty liver    Gastritis    GERD (gastroesophageal reflux disease)    HLD (hyperlipidemia)    Hypertension    Iron deficiency anemia due to chronic blood loss 12/28/2006   Qualifier: Diagnosis of  By: Eleni Griffin     Irregular menstrual cycle 09/28/2011   Leg length discrepancy 09/27/2011   Right leg shorter patient has lived now in right shoe.    Lightheadedness 09/24/2018   Lipoma 12/08/2011   Seems to be soft in nature not bony aspect likely lipoma right shoulder    Meniere syndrome    Menorrhagia with regular cycle 05/25/2017   Multiple joint pain 04/11/2012   Onychomycosis 02/20/2015   Panic attacks    Pes planus 09/27/2011   Arch bandages given bilaterally on September 27, 2011    Plantar fasciitis of right foot 07/31/2012   Recurrent incisional hernia with incarceration 01/22/2016   Sciatica of right side 04/18/2011   Secondary diabetes mellitus with hypercholesterolemia (HCC) 03/20/2009   Qualifier: Diagnosis of  By: Mariam Shingles  MD, Jody Mura     Skin inflammation 08/13/2018   Somatic complaints, multiple    T2DM (type 2 diabetes mellitus) (HCC)    Trigger finger, acquired 04/25/2013   Unspecified hearing loss 12/31/2008   Qualifier: Diagnosis of  By: Iverson Market RN, Almira Armour     Varicose vein of leg 08/31/2011   Past Surgical History:  Procedure Laterality Date    Abdominal U/S  2010   Other than fatty liver, normal   CESAREAN SECTION  2000,2004,2005   x3   Exercise Treadmill Test  2010   Normal, poor exercise tolerance   INCISIONAL HERNIA REPAIR N/A 01/04/2016   Procedure: RECURRENT INCISIONAL HERNIA;  Surgeon: Boyce Byes, MD;  Location: [facility] OR;  Service: General;  Laterality: N/A;   INSERTION OF MESH N/A 01/04/2016   Procedure: INSERTION OF MESH;  Surgeon: Boyce Byes, MD;  Location: [facility] OR;  Service: General;  Laterality: N/A;  PHASIX MESH   LAPAROTOMY N/A 01/04/2016   Procedure: EXPLORATORY LAPAROTOMY AND LYSIS OF ADHESIONS ;  Surgeon: Boyce Byes, MD;  Location: [facility] OR;  Service: General;  Laterality: N/A;   SMALL INTESTINE SURGERY  2007   Dr. Mammie Sears   TUBAL LIGATION     UMBILICAL HERNIA REPAIR  2007   Dr. Mammie Sears   Patient Active Problem List   Diagnosis Date Noted   Osteoarthritis of left knee 03/27/2024   Baker's cyst of knee, left 03/27/2024   Bilateral leg pain 06/27/2023   Seasonal allergies 06/27/2023   Concussion 01/30/2023   Head injury 01/07/2023   Vitamin D  deficiency 12/02/2022   Finger injury, initial encounter 12/02/2022   Yeast infection 05/16/2022   History of sciatica 04/18/2022   Abnormal serum  thyroid stimulating hormone (TSH) level 04/18/2022   Statin intolerance 09/28/2021   Abnormal uterine bleeding 09/20/2021   Hot flashes due to menopause 08/12/2021   Knee pain, left 05/21/2021   Perimenopause 05/21/2021   History of incisional hernia repair 08/11/2020   Right-sided low back pain with right-sided sciatica 06/05/2020   Right knee pain 10/12/2019   Osteoarthritis of spine with radiculopathy, cervical region 03/06/2018   BPPV (benign paroxysmal positional vertigo) 02/02/2016   Recurrent ventral incisional hernia with obstruction 01/04/2016   Callus of foot 07/07/2009   GERD 04/08/2009   Type 2 diabetes mellitus with obesity (HCC) 01/17/2007   DIZZINESS, CHRONIC 01/17/2007   Morbid obesity (HCC) 12/28/2006    Iron deficiency anemia due to chronic blood loss 12/28/2006   Hypertension associated with diabetes (HCC) 12/28/2006    PCP: Charmel Cooter, MD  REFERRING PROVIDER: Claybon Cuna, MD  THERAPY DIAG:  Left knee pain, unspecified chronicity - Plan: PT plan of care cert/re-cert  Muscle weakness - Plan: PT plan of care cert/re-cert  REFERRING DIAG: Primary osteoarthritis of left knee [M17.12]   Rationale for Evaluation and Treatment:  Rehabilitation  SUBJECTIVE:  PERTINENT PAST HISTORY:  Baker's cyst, DM II, cervical radiculopathy, R sided sciatica, htn        PRECAUTIONS: None  WEIGHT BEARING RESTRICTIONS No  FALLS:  Has patient fallen in last 6 months? No, Number of falls: 0  MOI/History of condition:  Onset date: several years  SUBJECTIVE STATEMENT  Meredith  Maynard is a 58 y.o. female who presents to clinic with chief complaint of L knee discomfort and tightness what prevents her from bending her knee.  She has a bakers cyst.  Recent injection was helpful in improving her knee flexion.  She feels that her knee hyper ext at times and has difficulty controlling this.  No specific inciting event.  In person interpreter used throughout  Pain:  Are you having pain? No  Occupation: Conservation officer, historic buildings Device: na  Hand Dominance: na  Patient Goals/Specific Activities: improve gait and comfort   OBJECTIVE:   DIAGNOSTIC FINDINGS:  Limited US  03/27/2024: Old degenerative tear of medial meniscus and possible partial tear of the lateral aspect. Baker's cyst with free floating soft tissue   GENERAL OBSERVATION/GAIT: Antalgic gait with reduced L knee flexion in gait  PALPATION: TTP posterior L knee  MUSCLE LENGTH: Hamstrings: Right significant restriction; Left significant restriction  LE MMT:  MMT Right (Eval) Left (Eval)  Hip flexion (L2, L3)    Knee extension (L3) 4 4  Knee flexion 4 3+*  Hip abduction    Hip extension    Hip  external rotation    Hip internal rotation    Hip adduction    Ankle dorsiflexion (L4)    Ankle plantarflexion (S1)    Ankle inversion    Ankle eversion    Great Toe ext (L5)    Grossly     (Blank rows = not tested, score listed is out of 5 possible points.  N = WNL, D = diminished, C = clear for gross weakness with myotome testing, * = concordant pain with testing)  LE ROM:  ROM Right (Eval) Left (Eval)  Hip flexion    Hip extension    Hip abduction    Hip adduction    Hip internal rotation    Hip external rotation    Knee extension Lacking 5 Lacking 10  Knee flexion 110 90  Ankle dorsiflexion    Ankle plantarflexion  Ankle inversion    Ankle eversion     (Blank rows = not tested, N = WNL, * = concordant pain with testing)  Functional Tests  Eval                                                             PATIENT SURVEYS:  LEFS: 26/80   TODAY'S TREATMENT:  Therapeutic Exercise: Creating, reviewing, and completing below HEP   PATIENT EDUCATION (/HM):  POC, diagnosis, prognosis, HEP, and outcome measures.  Pt educated via explanation, demonstration, and handout (HEP).  Pt confirms understanding verbally.   HOME EXERCISE PROGRAM: Access Code: [email or web address] URL: [email or web address] Date: 04/11/2024 Prepared by: Lesleigh Rash  Exercises - Long Sitting Quad Set with Towel Roll Under Heel  - 2 x daily - 7 x weekly - 1-2 sets - 10 reps - 5 seconds hold - Supine Heel Slide with Strap  - 2 x daily - 7 x weekly - 3 sets - 10 reps - Seated Hamstring Stretch  - 2 x daily - 7 x weekly - 1 sets - 3 reps - 45 hold  Treatment priorities   Eval                                                  ASSESSMENT:  CLINICAL IMPRESSION: Ashantae  is a 59 y.o. female who presents to clinic with signs and sxs consistent with L knee stiffness.  Baker's cyst confirmed on US  with old medial meniscus tear.  Objective limited d/t late arrival and  longer subjective d/t translation.  She will benefit from skilled therapy to address knee ROM and and improve ability to complete work tasks.  OBJECTIVE IMPAIRMENTS: Pain, knee flexion and ext ROM,   ACTIVITY LIMITATIONS: transfers (sitting to standing), squatting, work  PERSONAL FACTORS: See medical history and pertinent history   REHAB POTENTIAL: Good  CLINICAL DECISION MAKING: Evolving/moderate complexity  EVALUATION COMPLEXITY: Moderate   GOALS:   SHORT TERM GOALS: Target date: 05/09/2024   Antonietta  will be >75% HEP compliant to improve carryover between sessions and facilitate independent management of condition  Evaluation: ongoing Goal status: INITIAL   LONG TERM GOALS: Target date: 06/06/2024   Kadisha  will achieve 110 degrees knee flexion to improve ability to complete transfers, squat, and navigate steps  Evaluation/Baseline: 90 degrees Goal status: INITIAL    2.  Neva  will show a >/= 18 pt improvement in LEFS score (MCID is ~11% or 9 pts) as a proxy for functional improvement   Evaluation/Baseline: 26 pts Goal status: INITIAL   3.  Denesha  will be able to work and transfer from sitting to standing, not limited by knee stiffness  Evaluation/Baseline: limited Goal status: INITIAL   4.  Doryce  will improve the following MMTs to >/= 4+/5 to show improvement in strength:  knee flexion and ext (L)   Evaluation/Baseline: see chart in note Goal status: INITIAL   5.  Eevie  will report confidence in self management of condition at time of discharge with advanced HEP  Evaluation/Baseline: unable to self manage Goal status: INITIAL    PLAN: PT FREQUENCY: 1-2x/week  PT DURATION: 8 weeks  PLANNED INTERVENTIONS:  16109- PT Re-evaluation, 97110-Therapeutic exercises, 97530- Therapeutic activity, [id number]- Neuromuscular re-education, 97535- Self Care, [id number]- Manual therapy, [id number]- Gait training, [id number]- Aquatic Therapy, [phone]- Electrical stimulation  (manual), [id number]- Vasopneumatic device, [id number]- Traction (mechanical), [id number]- Ionotophoresis 4mg /ml Dexamethasone, Taping, Dry Needling, Joint manipulation, and Spinal manipulation.   Alexanderia Gorby PT, DPT 04/11/2024, 11:57 AM

## 2024-04-15 ENCOUNTER — Other Ambulatory Visit: Payer: Self-pay

## 2024-04-24 ENCOUNTER — Ambulatory Visit: Payer: Self-pay | Admitting: Family Medicine

## 2024-04-25 ENCOUNTER — Encounter: Payer: Self-pay | Admitting: Physical Therapy

## 2024-04-25 ENCOUNTER — Ambulatory Visit: Payer: Self-pay | Admitting: Physical Therapy

## 2024-04-25 DIAGNOSIS — M25562 Pain in left knee: Secondary | ICD-10-CM

## 2024-04-25 DIAGNOSIS — M6281 Muscle weakness (generalized): Secondary | ICD-10-CM

## 2024-04-25 NOTE — Therapy (Signed)
 OUTPATIENT PHYSICAL THERAPY DAILY NOTE  Patient Name: Meredith Maynard  Mortera-Contreras MRN: [id number] DOB:11-May-1966, 58 y.o., female Today's Date: 04/25/2024   PT End of Session - 04/25/24 1230     Visit Number 2    Number of Visits --   1-2x/week   Date for PT Re-Evaluation 06/06/24    Authorization Type Self pay    PT Start Time 1230    PT Stop Time 1310    PT Time Calculation (min) 40 min          Past Medical History:  Diagnosis Date   Anxiety    ANXIETY 12/28/2006   Qualifier: Diagnosis of  By: Damien Folks     BPPV (benign paroxysmal positional vertigo) 02/02/2016   Fatigue 04/24/2013   Fatty liver    Gastritis    GERD (gastroesophageal reflux disease)    HLD (hyperlipidemia)    Hypertension    Iron deficiency anemia due to chronic blood loss 12/28/2006   Qualifier: Diagnosis of  By: Damien Folks     Irregular menstrual cycle 09/28/2011   Leg length discrepancy 09/27/2011   Right leg shorter patient has lived now in right shoe.    Lightheadedness 09/24/2018   Lipoma 12/08/2011   Seems to be soft in nature not bony aspect likely lipoma right shoulder    Meniere syndrome    Menorrhagia with regular cycle 05/25/2017   Multiple joint pain 04/11/2012   Onychomycosis 02/20/2015   Panic attacks    Pes planus 09/27/2011   Arch bandages given bilaterally on September 27, 2011    Plantar fasciitis of right foot 07/31/2012   Recurrent incisional hernia with incarceration 01/22/2016   Sciatica of right side 04/18/2011   Secondary diabetes mellitus with hypercholesterolemia (HCC) 03/20/2009   Qualifier: Diagnosis of  By: Rilla  MD, Anton     Skin inflammation 08/13/2018   Somatic complaints, multiple    T2DM (type 2 diabetes mellitus) (HCC)    Trigger finger, acquired 04/25/2013   Unspecified hearing loss 12/31/2008   Qualifier: Diagnosis of  By: Elza RN, Donzell     Varicose vein of leg 08/31/2011   Past Surgical History:  Procedure Laterality Date   Abdominal U/S  2010    Other than fatty liver, normal   CESAREAN SECTION  2000,2004,2005   x3   Exercise Treadmill Test  2010   Normal, poor exercise tolerance   INCISIONAL HERNIA REPAIR N/A 01/04/2016   Procedure: RECURRENT INCISIONAL HERNIA;  Surgeon: Elon Pacini, MD;  Location: [facility];  Service: General;  Laterality: N/A;   INSERTION OF MESH N/A 01/04/2016   Procedure: INSERTION OF MESH;  Surgeon: Elon Pacini, MD;  Location: [facility];  Service: General;  Laterality: N/A;  PHASIX MESH   LAPAROTOMY N/A 01/04/2016   Procedure: EXPLORATORY LAPAROTOMY AND LYSIS OF ADHESIONS ;  Surgeon: Elon Pacini, MD;  Location: [facility];  Service: General;  Laterality: N/A;   SMALL INTESTINE SURGERY  2007   Dr. Kimble   TUBAL LIGATION     UMBILICAL HERNIA REPAIR  2007   Dr. Kimble   Patient Active Problem List   Diagnosis Date Noted   Osteoarthritis of left knee 03/27/2024   Baker's cyst of knee, left 03/27/2024   Bilateral leg pain 06/27/2023   Seasonal allergies 06/27/2023   Concussion 01/30/2023   Head injury 01/07/2023   Vitamin D  deficiency 12/02/2022   Finger injury, initial encounter 12/02/2022   Yeast infection 05/16/2022   History of sciatica 04/18/2022   Abnormal serum thyroid  stimulating hormone (TSH) level 04/18/2022   Statin intolerance 09/28/2021   Abnormal uterine bleeding 09/20/2021   Hot flashes due to menopause 08/12/2021   Knee pain, left 05/21/2021   Perimenopause 05/21/2021   History of incisional hernia repair 08/11/2020   Right-sided low back pain with right-sided sciatica 06/05/2020   Right knee pain 10/12/2019   Osteoarthritis of spine with radiculopathy, cervical region 03/06/2018   BPPV (benign paroxysmal positional vertigo) 02/02/2016   Recurrent ventral incisional hernia with obstruction 01/04/2016   Callus of foot 07/07/2009   GERD 04/08/2009   Type 2 diabetes mellitus with obesity (HCC) 01/17/2007   DIZZINESS, CHRONIC 01/17/2007   Morbid obesity (HCC) 12/28/2006   Iron deficiency  anemia due to chronic blood loss 12/28/2006   Hypertension associated with diabetes (HCC) 12/28/2006    PCP: Donzetta Rollene BRAVO, MD  REFERRING PROVIDER: Donzetta Rollene BRAVO, MD  THERAPY DIAG:  Left knee pain, unspecified chronicity  Muscle weakness  REFERRING DIAG: Primary osteoarthritis of left knee [M17.12]   Rationale for Evaluation and Treatment:  Rehabilitation  SUBJECTIVE:  PERTINENT PAST HISTORY:  Baker's cyst, DM II, cervical radiculopathy, R sided sciatica, htn        PRECAUTIONS: None  WEIGHT BEARING RESTRICTIONS No  FALLS:  Has patient fallen in last 6 months? No, Number of falls: 0  MOI/History of condition:  Onset date: several years  SUBJECTIVE STATEMENT  04/25/2024:  Pt reports that she has a little pain after exercise but overall is feeling better.  EVAL: Cyndra  Mortera-Contreras is a 58 y.o. female who presents to clinic with chief complaint of L knee discomfort and tightness what prevents her from bending her knee.  She has a bakers cyst.  Recent injection was helpful in improving her knee flexion.  She feels that her knee hyper ext at times and has difficulty controlling this.  No specific inciting event.  In person interpreter used throughout  Pain:  Are you having pain? No  Occupation: Conservation officer, historic buildings Device: na  Hand Dominance: na  Patient Goals/Specific Activities: improve gait and comfort   OBJECTIVE:   DIAGNOSTIC FINDINGS:  Limited US  03/27/2024: Old degenerative tear of medial meniscus and possible partial tear of the lateral aspect. Baker's cyst with free floating soft tissue   GENERAL OBSERVATION/GAIT: Antalgic gait with reduced L knee flexion in gait  PALPATION: TTP posterior L knee  MUSCLE LENGTH: Hamstrings: Right significant restriction; Left significant restriction  LE MMT:  MMT Right (Eval) Left (Eval)  Hip flexion (L2, L3)    Knee extension (L3) 4 4  Knee flexion 4 3+*  Hip abduction    Hip extension     Hip external rotation    Hip internal rotation    Hip adduction    Ankle dorsiflexion (L4)    Ankle plantarflexion (S1)    Ankle inversion    Ankle eversion    Great Toe ext (L5)    Grossly     (Blank rows = not tested, score listed is out of 5 possible points.  N = WNL, D = diminished, C = clear for gross weakness with myotome testing, * = concordant pain with testing)  LE ROM:  ROM Right (Eval) Left (Eval) L  Hip flexion     Hip extension     Hip abduction     Hip adduction     Hip internal rotation     Hip external rotation     Knee extension Lacking 5 Lacking 10 5 with slight  OP  Knee flexion 110 90 100 AROM, 105 w/ OP  Ankle dorsiflexion     Ankle plantarflexion     Ankle inversion     Ankle eversion      (Blank rows = not tested, N = WNL, * = concordant pain with testing)  Functional Tests  Eval                                                             PATIENT SURVEYS:  LEFS: 26/80   TODAY'S TREATMENT:  [facility] Adult PT Treatment  04/25/2024:  Therapeutic Exercise: Quad set with towel under calf SLR - 3x5 LTR Supine clam - GTB - 2x10 Bridge - 2x10 Education regarding benefits of exercise, benefits of staying active with OA    HOME EXERCISE PROGRAM: Access Code: [email or web address] URL: [email or web address] Date: 04/11/2024 Prepared by: Helene Gasmen  Exercises - Long Sitting Quad Set with Towel Roll Under Heel  - 2 x daily - 7 x weekly - 1-2 sets - 10 reps - 5 seconds hold - Supine Heel Slide with Strap  - 2 x daily - 7 x weekly - 3 sets - 10 reps - Seated Hamstring Stretch  - 2 x daily - 7 x weekly - 1 sets - 3 reps - 45 hold  Treatment priorities   Eval                                                  ASSESSMENT:  CLINICAL IMPRESSION:  Elysse  tolerated session well with no adverse reaction.  Progressed knee exercises and updated HEP.  Added in hip strengthening.  ROM significantly improved today.  Questions  answered.  EVAL:  Shyia  is a 58 y.o. female who presents to clinic with signs and sxs consistent with L knee stiffness.  Baker's cyst confirmed on US  with old medial meniscus tear.  Objective limited d/t late arrival and longer subjective d/t translation.  She will benefit from skilled therapy to address knee ROM and and improve ability to complete work tasks.  OBJECTIVE IMPAIRMENTS: Pain, knee flexion and ext ROM,   ACTIVITY LIMITATIONS: transfers (sitting to standing), squatting, work  PERSONAL FACTORS: See medical history and pertinent history   REHAB POTENTIAL: Good  CLINICAL DECISION MAKING: Evolving/moderate complexity  EVALUATION COMPLEXITY: Moderate   GOALS:   SHORT TERM GOALS: Target date: 7/10   Kareema  will be >75% HEP compliant to improve carryover between sessions and facilitate independent management of condition  Evaluation: ongoing Goal status: MET   LONG TERM GOALS: Target date: 8/7   Melvin  will achieve 110 degrees knee flexion to improve ability to complete transfers, squat, and navigate steps  Evaluation/Baseline: 90 degrees Goal status: INITIAL    2.  Ginger  will show a >/= 18 pt improvement in LEFS score (MCID is ~11% or 9 pts) as a proxy for functional improvement   Evaluation/Baseline: 26 pts Goal status: INITIAL   3.  Adrieana  will be able to work and transfer from sitting to standing, not limited by knee stiffness  Evaluation/Baseline: limited Goal status: INITIAL   4.  Bruna  will improve the following MMTs to >/= 4+/5 to show improvement  in strength:  knee flexion and ext (L)   Evaluation/Baseline: see chart in note Goal status: INITIAL   5.  Dayjah  will report confidence in self management of condition at time of discharge with advanced HEP  Evaluation/Baseline: unable to self manage Goal status: INITIAL    PLAN: PT FREQUENCY: 1-2x/week  PT DURATION: 8 weeks  PLANNED INTERVENTIONS:  97164- PT Re-evaluation,  97110-Therapeutic exercises, 97530- Therapeutic activity, 97112- Neuromuscular re-education, 97535- Self Care, [id number]- Manual therapy, [id number]- Gait training, [id number]- Aquatic Therapy, [id number]- Electrical stimulation (manual), [id number]- Vasopneumatic device, [id number]- Traction (mechanical), [id number]- Ionotophoresis 4mg /ml Dexamethasone, Taping, Dry Needling, Joint manipulation, and Spinal manipulation.   Gwynn Crossley PT, DPT 04/25/2024, 1:24 PM

## 2024-05-02 ENCOUNTER — Encounter: Payer: Self-pay | Admitting: Physician Assistant

## 2024-05-21 ENCOUNTER — Encounter: Payer: Self-pay | Admitting: Physical Therapy

## 2024-05-21 ENCOUNTER — Ambulatory Visit: Attending: Family Medicine | Admitting: Physical Therapy

## 2024-05-21 DIAGNOSIS — M6281 Muscle weakness (generalized): Secondary | ICD-10-CM | POA: Insufficient documentation

## 2024-05-21 DIAGNOSIS — M25562 Pain in left knee: Secondary | ICD-10-CM | POA: Insufficient documentation

## 2024-05-21 NOTE — Therapy (Signed)
 OUTPATIENT PHYSICAL THERAPY DAILY NOTE  Patient Name: Meredith Maynard MRN: [id number] DOB:1966/07/22, 58 y.o., female Today's Date: 05/21/2024   PT End of Session - 05/21/24 1016     Visit Number 3    Number of Visits --   1-2x/week   Date for PT Re-Evaluation 06/06/24    Authorization Type Self pay    PT Start Time 1015    PT Stop Time 1056    PT Time Calculation (min) 41 min          Past Medical History:  Diagnosis Date   Anxiety    ANXIETY 12/28/2006   Qualifier: Diagnosis of  By: Damien Folks     BPPV (benign paroxysmal positional vertigo) 02/02/2016   Fatigue 04/24/2013   Fatty liver    Gastritis    GERD (gastroesophageal reflux disease)    HLD (hyperlipidemia)    Hypertension    Iron deficiency anemia due to chronic blood loss 12/28/2006   Qualifier: Diagnosis of  By: Damien Folks     Irregular menstrual cycle 09/28/2011   Leg length discrepancy 09/27/2011   Right leg shorter patient has lived now in right shoe.    Lightheadedness 09/24/2018   Lipoma 12/08/2011   Seems to be soft in nature not bony aspect likely lipoma right shoulder    Meniere syndrome    Menorrhagia with regular cycle 05/25/2017   Multiple joint pain 04/11/2012   Onychomycosis 02/20/2015   Panic attacks    Pes planus 09/27/2011   Arch bandages given bilaterally on September 27, 2011    Plantar fasciitis of right foot 07/31/2012   Recurrent incisional hernia with incarceration 01/22/2016   Sciatica of right side 04/18/2011   Secondary diabetes mellitus with hypercholesterolemia (HCC) 03/20/2009   Qualifier: Diagnosis of  By: Rilla  MD, Anton     Skin inflammation 08/13/2018   Somatic complaints, multiple    T2DM (type 2 diabetes mellitus) (HCC)    Trigger finger, acquired 04/25/2013   Unspecified hearing loss 12/31/2008   Qualifier: Diagnosis of  By: Elza RN, Donzell     Varicose vein of leg 08/31/2011   Past Surgical History:  Procedure Laterality Date   Abdominal U/S  2010    Other than fatty liver, normal   CESAREAN SECTION  2000,2004,2005   x3   Exercise Treadmill Test  2010   Normal, poor exercise tolerance   INCISIONAL HERNIA REPAIR N/A 01/04/2016   Procedure: RECURRENT INCISIONAL HERNIA;  Surgeon: Elon Pacini, MD;  Location: [facility] OR;  Service: General;  Laterality: N/A;   INSERTION OF MESH N/A 01/04/2016   Procedure: INSERTION OF MESH;  Surgeon: Elon Pacini, MD;  Location: [facility];  Service: General;  Laterality: N/A;  PHASIX MESH   LAPAROTOMY N/A 01/04/2016   Procedure: EXPLORATORY LAPAROTOMY AND LYSIS OF ADHESIONS ;  Surgeon: Elon Pacini, MD;  Location: [facility] OR;  Service: General;  Laterality: N/A;   SMALL INTESTINE SURGERY  2007   Dr. Kimble   TUBAL LIGATION     UMBILICAL HERNIA REPAIR  2007   Dr. Kimble   Patient Active Problem List   Diagnosis Date Noted   Osteoarthritis of left knee 03/27/2024   Baker's cyst of knee, left 03/27/2024   Bilateral leg pain 06/27/2023   Seasonal allergies 06/27/2023   Concussion 01/30/2023   Head injury 01/07/2023   Vitamin D  deficiency 12/02/2022   Finger injury, initial encounter 12/02/2022   Yeast infection 05/16/2022   History of sciatica 04/18/2022   Abnormal serum thyroid  stimulating hormone (TSH) level 04/18/2022   Statin intolerance 09/28/2021   Abnormal uterine bleeding 09/20/2021   Hot flashes due to menopause 08/12/2021   Knee pain, left 05/21/2021   Perimenopause 05/21/2021   History of incisional hernia repair 08/11/2020   Right-sided low back pain with right-sided sciatica 06/05/2020   Right knee pain 10/12/2019   Osteoarthritis of spine with radiculopathy, cervical region 03/06/2018   BPPV (benign paroxysmal positional vertigo) 02/02/2016   Recurrent ventral incisional hernia with obstruction 01/04/2016   Callus of foot 07/07/2009   GERD 04/08/2009   Type 2 diabetes mellitus with obesity (HCC) 01/17/2007   DIZZINESS, CHRONIC 01/17/2007   Morbid obesity (HCC) 12/28/2006   Iron deficiency  anemia due to chronic blood loss 12/28/2006   Hypertension associated with diabetes (HCC) 12/28/2006    PCP: Donzetta Rollene BRAVO, MD  REFERRING PROVIDER: Donzetta Rollene BRAVO, MD  THERAPY DIAG:  Left knee pain, unspecified chronicity  Muscle weakness  REFERRING DIAG: Primary osteoarthritis of left knee [M17.12]   Rationale for Evaluation and Treatment:  Rehabilitation  SUBJECTIVE:  PERTINENT PAST HISTORY:  Baker's cyst, DM II, cervical radiculopathy, R sided sciatica, htn        PRECAUTIONS: None  WEIGHT BEARING RESTRICTIONS No  FALLS:  Has patient fallen in last 6 months? No, Number of falls: 0  MOI/History of condition:  Onset date: several years  SUBJECTIVE STATEMENT  05/21/2024:  Pt reports that she is doing better overall.  She can now walk for 1-2 hours with some pain.  She reports that the pain can occur in both knees.  The pain can reach a 4/10 after walking for more than 30 min.  EVAL: Meredith  Maynard is a 58 y.o. female who presents to clinic with chief complaint of L knee discomfort and tightness what prevents her from bending her knee.  She has a bakers cyst.  Recent injection was helpful in improving her knee flexion.  She feels that her knee hyper ext at times and has difficulty controlling this.  No specific inciting event.  In person interpreter used throughout  Pain:  Are you having pain? No  Occupation: Conservation officer, historic buildings Device: na  Hand Dominance: na  Patient Goals/Specific Activities: improve gait and comfort   OBJECTIVE:   DIAGNOSTIC FINDINGS:  Limited US  03/27/2024: Old degenerative tear of medial meniscus and possible partial tear of the lateral aspect. Baker's cyst with free floating soft tissue   GENERAL OBSERVATION/GAIT: Antalgic gait with reduced L knee flexion in gait  PALPATION: TTP posterior L knee  MUSCLE LENGTH: Hamstrings: Right significant restriction; Left significant restriction  LE MMT:  MMT  Right (Eval) Left (Eval)  Hip flexion (L2, L3)    Knee extension (L3) 4 4  Knee flexion 4 3+*  Hip abduction    Hip extension    Hip external rotation    Hip internal rotation    Hip adduction    Ankle dorsiflexion (L4)    Ankle plantarflexion (S1)    Ankle inversion    Ankle eversion    Great Toe ext (L5)    Grossly     (Blank rows = not tested, score listed is out of 5 possible points.  N = WNL, D = diminished, C = clear for gross weakness with myotome testing, * = concordant pain with testing)  LE ROM:  ROM Right (Eval) Left (Eval) L  Hip flexion     Hip extension     Hip abduction  Hip adduction     Hip internal rotation     Hip external rotation     Knee extension Lacking 5 Lacking 10 5 with slight OP  Knee flexion 110 90 100 AROM, 105 w/ OP  Ankle dorsiflexion     Ankle plantarflexion     Ankle inversion     Ankle eversion      (Blank rows = not tested, N = WNL, * = concordant pain with testing)  Functional Tests  Eval                                                             PATIENT SURVEYS:  LEFS: 26/80   TODAY'S TREATMENT:  [facility] Adult PT Treatment  05/21/2024:  Therapeutic Exercise: Quad set with towel under calf SLR - 2x10 ea SAQ - 4# - 2x10 ea Staggered bridge - 2x10 LAQ - RTB - 2x10 ea Education regarding benefits of exercise    HOME EXERCISE PROGRAM: Access Code: [email or web address] URL: [email or web address] Date: 05/21/2024 Prepared by: Helene Gasmen  Exercises - Long Sitting Quad Set with Towel Roll Under Heel  - 2 x daily - 7 x weekly - 1-2 sets - 10 reps - 5 seconds hold - Active Straight Leg Raise with Quad Set  - 1 x daily - 7 x weekly - 3 sets - 5-10 reps - Staggered Bridge  - 1 x daily - 7 x weekly - 2 sets - 10 reps - Supine Short Arc Quad  - 1 x daily - 7 x weekly - 2 sets - 10 reps - Seated Knee Extension with Resistance  - 1 x daily - 7 x weekly - 2-3 sets - 10 reps  Treatment  priorities   Eval                                                  ASSESSMENT:  CLINICAL IMPRESSION:  Loray  tolerated session well with no adverse reaction.  Progressed knee exercises and updated HEP.  Able to progress to staggered bridge and OC knee ext.  Will begin squat progression next visit.  EVAL:  Janeal  is a 58 y.o. female who presents to clinic with signs and sxs consistent with L knee stiffness.  Baker's cyst confirmed on US  with old medial meniscus tear.  Objective limited d/t late arrival and longer subjective d/t translation.  She will benefit from skilled therapy to address knee ROM and and improve ability to complete work tasks.  OBJECTIVE IMPAIRMENTS: Pain, knee flexion and ext ROM,   ACTIVITY LIMITATIONS: transfers (sitting to standing), squatting, work  PERSONAL FACTORS: See medical history and pertinent history   REHAB POTENTIAL: Good  CLINICAL DECISION MAKING: Evolving/moderate complexity  EVALUATION COMPLEXITY: Moderate   GOALS:   SHORT TERM GOALS: Target date: 7/10   Zuma  will be >75% HEP compliant to improve carryover between sessions and facilitate independent management of condition  Evaluation: ongoing Goal status: MET   LONG TERM GOALS: Target date: 8/7   Matsuko  will achieve 110 degrees knee flexion to improve ability to complete transfers, squat, and navigate steps  Evaluation/Baseline: 90 degrees Goal status: INITIAL    2.  Cindel  will show a >/= 18 pt improvement in LEFS score (MCID is ~11% or 9 pts) as a proxy for functional improvement   Evaluation/Baseline: 26 pts Goal status: INITIAL   3.  Melana  will be able to work and transfer from sitting to standing, not limited by knee stiffness  Evaluation/Baseline: limited Goal status: INITIAL   4.  Anabell  will improve the following MMTs to >/= 4+/5 to show improvement in strength:  knee flexion and ext (L)   Evaluation/Baseline: see chart in note Goal  status: INITIAL   5.  Keilani  will report confidence in self management of condition at time of discharge with advanced HEP  Evaluation/Baseline: unable to self manage Goal status: INITIAL    PLAN: PT FREQUENCY: 1-2x/week  PT DURATION: 8 weeks  PLANNED INTERVENTIONS:  97164- PT Re-evaluation, 97110-Therapeutic exercises, 97530- Therapeutic activity, 97112- Neuromuscular re-education, 97535- Self Care, 02859- Manual therapy, [id number]- Gait training, [id number]- Aquatic Therapy, [id number]- Electrical stimulation (manual), [id number]- Vasopneumatic device, [id number]- Traction (mechanical), [id number]- Ionotophoresis 4mg /ml Dexamethasone, Taping, Dry Needling, Joint manipulation, and Spinal manipulation.   Ashaunte Standley PT, DPT 05/21/2024, 11:12 AM

## 2024-06-03 ENCOUNTER — Other Ambulatory Visit: Payer: Self-pay

## 2024-06-04 ENCOUNTER — Ambulatory Visit: Admitting: Physical Therapy

## 2024-06-04 ENCOUNTER — Other Ambulatory Visit: Payer: Self-pay

## 2024-06-12 ENCOUNTER — Ambulatory Visit: Attending: Family Medicine | Admitting: Physical Therapy

## 2024-06-12 ENCOUNTER — Encounter: Payer: Self-pay | Admitting: Physical Therapy

## 2024-06-12 ENCOUNTER — Other Ambulatory Visit: Payer: Self-pay

## 2024-06-12 DIAGNOSIS — M6281 Muscle weakness (generalized): Secondary | ICD-10-CM | POA: Insufficient documentation

## 2024-06-12 DIAGNOSIS — M25562 Pain in left knee: Secondary | ICD-10-CM | POA: Insufficient documentation

## 2024-06-12 NOTE — Therapy (Signed)
 OUTPATIENT PHYSICAL THERAPY DAILY NOTE  Patient Name: Meredith  Maynard MRN: [id number] DOB:1966-09-15, 58 y.o., female Today's Date: 06/12/2024   PT End of Session - 06/12/24 0800     Visit Number 4    Number of Visits --   1-2x/week   Date for PT Re-Evaluation 08/07/24    Authorization Type Self pay    PT Start Time 0800    PT Stop Time 0842    PT Time Calculation (min) 42 min          Past Medical History:  Diagnosis Date   Anxiety    ANXIETY 12/28/2006   Qualifier: Diagnosis of  By: Damien Folks     BPPV (benign paroxysmal positional vertigo) 02/02/2016   Fatigue 04/24/2013   Fatty liver    Gastritis    GERD (gastroesophageal reflux disease)    HLD (hyperlipidemia)    Hypertension    Iron deficiency anemia due to chronic blood loss 12/28/2006   Qualifier: Diagnosis of  By: Damien Folks     Irregular menstrual cycle 09/28/2011   Leg length discrepancy 09/27/2011   Right leg shorter patient has lived now in right shoe.    Lightheadedness 09/24/2018   Lipoma 12/08/2011   Seems to be soft in nature not bony aspect likely lipoma right shoulder    Meniere syndrome    Menorrhagia with regular cycle 05/25/2017   Multiple joint pain 04/11/2012   Onychomycosis 02/20/2015   Panic attacks    Pes planus 09/27/2011   Arch bandages given bilaterally on September 27, 2011    Plantar fasciitis of right foot 07/31/2012   Recurrent incisional hernia with incarceration 01/22/2016   Sciatica of right side 04/18/2011   Secondary diabetes mellitus with hypercholesterolemia (HCC) 03/20/2009   Qualifier: Diagnosis of  By: Rilla  MD, Anton     Skin inflammation 08/13/2018   Somatic complaints, multiple    T2DM (type 2 diabetes mellitus) (HCC)    Trigger finger, acquired 04/25/2013   Unspecified hearing loss 12/31/2008   Qualifier: Diagnosis of  By: Elza RN, Donzell     Varicose vein of leg 08/31/2011   Past Surgical History:  Procedure Laterality Date   Abdominal U/S  2010    Other than fatty liver, normal   CESAREAN SECTION  2000,2004,2005   x3   Exercise Treadmill Test  2010   Normal, poor exercise tolerance   INCISIONAL HERNIA REPAIR N/A 01/04/2016   Procedure: RECURRENT INCISIONAL HERNIA;  Surgeon: Elon Pacini, MD;  Location: [facility] OR;  Service: General;  Laterality: N/A;   INSERTION OF MESH N/A 01/04/2016   Procedure: INSERTION OF MESH;  Surgeon: Elon Pacini, MD;  Location: [facility] OR;  Service: General;  Laterality: N/A;  PHASIX MESH   LAPAROTOMY N/A 01/04/2016   Procedure: EXPLORATORY LAPAROTOMY AND LYSIS OF ADHESIONS ;  Surgeon: Elon Pacini, MD;  Location: [facility];  Service: General;  Laterality: N/A;   SMALL INTESTINE SURGERY  2007   Dr. Kimble   TUBAL LIGATION     UMBILICAL HERNIA REPAIR  2007   Dr. Kimble   Patient Active Problem List   Diagnosis Date Noted   Osteoarthritis of left knee 03/27/2024   Baker's cyst of knee, left 03/27/2024   Bilateral leg pain 06/27/2023   Seasonal allergies 06/27/2023   Concussion 01/30/2023   Head injury 01/07/2023   Vitamin D  deficiency 12/02/2022   Finger injury, initial encounter 12/02/2022   Yeast infection 05/16/2022   History of sciatica 04/18/2022   Abnormal serum thyroid  stimulating hormone (TSH) level 04/18/2022   Statin intolerance 09/28/2021   Abnormal uterine bleeding 09/20/2021   Hot flashes due to menopause 08/12/2021   Knee pain, left 05/21/2021   Perimenopause 05/21/2021   History of incisional hernia repair 08/11/2020   Right-sided low back pain with right-sided sciatica 06/05/2020   Right knee pain 10/12/2019   Osteoarthritis of spine with radiculopathy, cervical region 03/06/2018   BPPV (benign paroxysmal positional vertigo) 02/02/2016   Recurrent ventral incisional hernia with obstruction 01/04/2016   Callus of foot 07/07/2009   GERD 04/08/2009   Type 2 diabetes mellitus with obesity (HCC) 01/17/2007   DIZZINESS, CHRONIC 01/17/2007   Morbid obesity (HCC) 12/28/2006   Iron deficiency  anemia due to chronic blood loss 12/28/2006   Hypertension associated with diabetes (HCC) 12/28/2006    PCP: Donzetta Rollene BRAVO, MD  REFERRING PROVIDER: Donzetta Rollene BRAVO, MD  THERAPY DIAG:  Left knee pain, unspecified chronicity - Plan: PT plan of care cert/re-cert  Muscle weakness - Plan: PT plan of care cert/re-cert  REFERRING DIAG: Primary osteoarthritis of left knee [M17.12]   Rationale for Evaluation and Treatment:  Rehabilitation  SUBJECTIVE:  PERTINENT PAST HISTORY:  Baker's cyst, DM II, cervical radiculopathy, R sided sciatica, htn        PRECAUTIONS: None  WEIGHT BEARING RESTRICTIONS No  FALLS:  Has patient fallen in last 6 months? No, Number of falls: 0  MOI/History of condition:  Onset date: several years  SUBJECTIVE STATEMENT  06/12/2024:  Pt reports that her knee continues to improve.  She has been going to the gym, walking, and doing the bike.  EVAL: Emberlie  Maynard is a 58 y.o. female who presents to clinic with chief complaint of L knee discomfort and tightness what prevents her from bending her knee.  She has a bakers cyst.  Recent injection was helpful in improving her knee flexion.  She feels that her knee hyper ext at times and has difficulty controlling this.  No specific inciting event.  In person interpreter used throughout  Pain:  Are you having pain? No  Occupation: Conservation officer, historic buildings Device: na  Hand Dominance: na  Patient Goals/Specific Activities: improve gait and comfort   OBJECTIVE:   DIAGNOSTIC FINDINGS:  Limited US  03/27/2024: Old degenerative tear of medial meniscus and possible partial tear of the lateral aspect. Baker's cyst with free floating soft tissue   GENERAL OBSERVATION/GAIT: Antalgic gait with reduced L knee flexion in gait  PALPATION: TTP posterior L knee  MUSCLE LENGTH: Hamstrings: Right significant restriction; Left significant restriction  LE MMT:  MMT Right (Eval) Left (Eval) R/L 8/13   Hip flexion (L2, L3)     Knee extension (L3) 4 4 4+/4+  Knee flexion 4 3+* 4/4  Hip abduction     Hip extension     Hip external rotation     Hip internal rotation     Hip adduction     Ankle dorsiflexion (L4)     Ankle plantarflexion (S1)     Ankle inversion     Ankle eversion     Great Toe ext (L5)     Grossly      (Blank rows = not tested, score listed is out of 5 possible points.  N = WNL, D = diminished, C = clear for gross weakness with myotome testing, * = concordant pain with testing)  LE ROM:  ROM Right (Eval) Left (Eval) L  Hip flexion     Hip extension  Hip abduction     Hip adduction     Hip internal rotation     Hip external rotation     Knee extension Lacking 5 Lacking 10 5 with slight OP  Knee flexion 110 90 100 AROM, 105 w/ OP  Ankle dorsiflexion     Ankle plantarflexion     Ankle inversion     Ankle eversion      (Blank rows = not tested, N = WNL, * = concordant pain with testing)  Functional Tests  Eval                                                             PATIENT SURVEYS:  LEFS: 26/80   TODAY'S TREATMENT:  [facility] Adult PT Treatment  06/12/2024:  Therapeutic Exercise: Knee ext machine - 5# - 3x10 Knee flexion machine - 20# - 3x10  Therapeutic Activity  collecting information for goals, checking progress, and reviewing with patient STS from slight raised table  Leg press - 25#    HOME EXERCISE PROGRAM: Access Code: [email or web address] URL: [email or web address] Date: 06/12/2024 Prepared by: Helene Gasmen  Exercises - Recumbent Bike  - 1 x daily - 3-4 x weekly - 1 reps - 15-30 min hold - Sit to Stand Without Arm Support  - 1 x daily - 4 x weekly - 2-3 sets - 10 reps - Knee Extension with Weight Machine  - 1 x daily - 2-4 x weekly - 3 sets - 10 reps - Hamstring Curl with Weight Machine  - 1 x daily - 2-4 x weekly - 3 sets - 10 reps - Full Leg Press  - 1 x daily - 2-4 x weekly - 3 sets - 10  reps  Treatment priorities   Eval                                                  ASSESSMENT:  CLINICAL IMPRESSION:  Shantae  tolerated session well with no adverse reaction.  Pt has made significant progress on all checked goals and has returned to gym with aerobic exercise with no issue.  Introduced isolated knee and hip machines that she will begin completing at gym.  EVAL:  Shiana  is a 58 y.o. female who presents to clinic with signs and sxs consistent with L knee stiffness.  Baker's cyst confirmed on US  with old medial meniscus tear.  Objective limited d/t late arrival and longer subjective d/t translation.  She will benefit from skilled therapy to address knee ROM and and improve ability to complete work tasks.  OBJECTIVE IMPAIRMENTS: Pain, knee flexion and ext ROM,   ACTIVITY LIMITATIONS: transfers (sitting to standing), squatting, work  PERSONAL FACTORS: See medical history and pertinent history   REHAB POTENTIAL: Good  CLINICAL DECISION MAKING: Evolving/moderate complexity  EVALUATION COMPLEXITY: Moderate   GOALS:   SHORT TERM GOALS: Target date: 7/10   Brynlyn  will be >75% HEP compliant to improve carryover between sessions and facilitate independent management of condition  Evaluation: ongoing Goal status: MET   LONG TERM GOALS: Target date: extended to 08/07/2024    Alisson  will achieve 110 degrees knee flexion to improve ability to complete  transfers, squat, and navigate steps  Evaluation/Baseline: 90 degrees 8/13: 110 Goal status: MET    2.  Diahann  will show a >/= 18 pt improvement in LEFS score (MCID is ~11% or 9 pts) as a proxy for functional improvement   Evaluation/Baseline: 26 pts Goal status: Ongoing   3.  Kaelea  will be able to work and transfer from sitting to standing, not limited by knee stiffness  Evaluation/Baseline: limited 8/13: Improving Goal status: Ongoing   4.  Arvie  will improve the following MMTs to  >/= 4+/5 to show improvement in strength:  knee flexion and ext (L)   Evaluation/Baseline: see chart in note Goal status: INITIAL   5.  Reggie  will report confidence in self management of condition at time of discharge with advanced HEP  Evaluation/Baseline: unable to self manage Goal status: INITIAL    PLAN: PT FREQUENCY: 1-2x/week  PT DURATION: 8 weeks  PLANNED INTERVENTIONS:  97164- PT Re-evaluation, 97110-Therapeutic exercises, 97530- Therapeutic activity, 97112- Neuromuscular re-education, 97535- Self Care, 02859- Manual therapy, [id number]- Gait training, [id number]- Aquatic Therapy, [id number]- Electrical stimulation (manual), [id number]- Vasopneumatic device, [id number]- Traction (mechanical), [id number]- Ionotophoresis 4mg /ml Dexamethasone, Taping, Dry Needling, Joint manipulation, and Spinal manipulation.   Ebonee Stober PT, DPT 06/12/2024, 8:55 AM

## 2024-06-18 ENCOUNTER — Encounter: Admitting: Physical Therapy

## 2024-07-03 ENCOUNTER — Ambulatory Visit: Attending: Family Medicine | Admitting: Physical Therapy

## 2024-07-03 ENCOUNTER — Encounter: Payer: Self-pay | Admitting: Physical Therapy

## 2024-07-03 DIAGNOSIS — M6281 Muscle weakness (generalized): Secondary | ICD-10-CM | POA: Insufficient documentation

## 2024-07-03 DIAGNOSIS — M25562 Pain in left knee: Secondary | ICD-10-CM | POA: Insufficient documentation

## 2024-07-03 NOTE — Therapy (Signed)
 OUTPATIENT PHYSICAL THERAPY DAILY NOTE  Patient Name: Meredith Maynard MRN: [id number] DOB:[birth year], 58 y.o., female Today's Date: 07/03/2024   PT End of Session - 07/03/24 1019     Visit Number 5    Number of Visits --   1-2x/week   Date for PT Re-Evaluation 08/07/24    Authorization Type Self pay    PT Start Time 1017    PT Stop Time 1057    PT Time Calculation (min) 40 min          Past Medical History:  Diagnosis Date   Anxiety    ANXIETY 12/28/2006   Qualifier: Diagnosis of  By: Damien Folks     BPPV (benign paroxysmal positional vertigo) 02/02/2016   Fatigue 04/24/2013   Fatty liver    Gastritis    GERD (gastroesophageal reflux disease)    HLD (hyperlipidemia)    Hypertension    Iron deficiency anemia due to chronic blood loss 12/28/2006   Qualifier: Diagnosis of  By: Damien Folks     Irregular menstrual cycle 09/28/2011   Leg length discrepancy 09/27/2011   Right leg shorter patient has lived now in right shoe.    Lightheadedness 09/24/2018   Lipoma 12/08/2011   Seems to be soft in nature not bony aspect likely lipoma right shoulder    Meniere syndrome    Menorrhagia with regular cycle 05/25/2017   Multiple joint pain 04/11/2012   Onychomycosis 02/20/2015   Panic attacks    Pes planus 09/27/2011   Arch bandages given bilaterally on September 27, 2011    Plantar fasciitis of right foot 07/31/2012   Recurrent incisional hernia with incarceration 01/22/2016   Sciatica of right side 04/18/2011   Secondary diabetes mellitus with hypercholesterolemia (HCC) 03/20/2009   Qualifier: Diagnosis of  By: Rilla  MD, Anton     Skin inflammation 08/13/2018   Somatic complaints, multiple    T2DM (type 2 diabetes mellitus) (HCC)    Trigger finger, acquired 04/25/2013   Unspecified hearing loss 12/31/2008   Qualifier: Diagnosis of  By: Elza RN, Donzell     Varicose vein of leg 08/31/2011   Past Surgical History:  Procedure Laterality Date   Abdominal U/S  2010    Other than fatty liver, normal   CESAREAN SECTION  2000,2004,2005   x3   Exercise Treadmill Test  2010   Normal, poor exercise tolerance   INCISIONAL HERNIA REPAIR N/A 01/04/2016   Procedure: RECURRENT INCISIONAL HERNIA;  Surgeon: Elon Pacini, MD;  Location: [facility] OR;  Service: General;  Laterality: N/A;   INSERTION OF MESH N/A 01/04/2016   Procedure: INSERTION OF MESH;  Surgeon: Elon Pacini, MD;  Location: [facility] OR;  Service: General;  Laterality: N/A;  PHASIX MESH   LAPAROTOMY N/A 01/04/2016   Procedure: EXPLORATORY LAPAROTOMY AND LYSIS OF ADHESIONS ;  Surgeon: Elon Pacini, MD;  Location: [facility] OR;  Service: General;  Laterality: N/A;   SMALL INTESTINE SURGERY  2007   Dr. Kimble   TUBAL LIGATION     UMBILICAL HERNIA REPAIR  2007   Dr. Kimble   Patient Active Problem List   Diagnosis Date Noted   Osteoarthritis of left knee 03/27/2024   Baker's cyst of knee, left 03/27/2024   Bilateral leg pain 06/27/2023   Seasonal allergies 06/27/2023   Concussion 01/30/2023   Head injury 01/07/2023   Vitamin D  deficiency 12/02/2022   Finger injury, initial encounter 12/02/2022   Yeast infection 05/16/2022   History of sciatica 04/18/2022   Abnormal serum thyroid  stimulating hormone (TSH) level 04/18/2022   Statin intolerance 09/28/2021   Abnormal uterine bleeding 09/20/2021   Hot flashes due to menopause 08/12/2021   Knee pain, left 05/21/2021   Perimenopause 05/21/2021   History of incisional hernia repair 08/11/2020   Right-sided low back pain with right-sided sciatica 06/05/2020   Right knee pain 10/12/2019   Osteoarthritis of spine with radiculopathy, cervical region 03/06/2018   BPPV (benign paroxysmal positional vertigo) 02/02/2016   Recurrent ventral incisional hernia with obstruction 01/04/2016   Callus of foot 07/07/2009   GERD 04/08/2009   Type 2 diabetes mellitus with obesity (HCC) 01/17/2007   DIZZINESS, CHRONIC 01/17/2007   Morbid obesity (HCC) 12/28/2006   Iron deficiency  anemia due to chronic blood loss 12/28/2006   Hypertension associated with diabetes (HCC) 12/28/2006    PCP: Donzetta Rollene BRAVO, MD  REFERRING PROVIDER: Donzetta Rollene BRAVO, MD  THERAPY DIAG:  Left knee pain, unspecified chronicity  Muscle weakness  REFERRING DIAG: Primary osteoarthritis of left knee [M17.12]   Rationale for Evaluation and Treatment:  Rehabilitation  SUBJECTIVE:  PERTINENT PAST HISTORY:  Baker's cyst, DM II, cervical radiculopathy, R sided sciatica, htn        PRECAUTIONS: None  WEIGHT BEARING RESTRICTIONS No  FALLS:  Has patient fallen in last 6 months? No, Number of falls: 0  MOI/History of condition:  Onset date: several years  SUBJECTIVE STATEMENT  07/03/2024:  Pt reports that she is back to work.  She has had a few instances of pain but this was transient.  She has some difficulty with R rotation vs L and feels this may be hip related.  EVAL: Meredith  Maynard is a 58 y.o. female who presents to clinic with chief complaint of L knee discomfort and tightness what prevents her from bending her knee.  She has a bakers cyst.  Recent injection was helpful in improving her knee flexion.  She feels that her knee hyper ext at times and has difficulty controlling this.  No specific inciting event.  In person interpreter used throughout  Pain:  Are you having pain? No  Occupation: Conservation officer, historic buildings Device: na  Hand Dominance: na  Patient Goals/Specific Activities: improve gait and comfort   OBJECTIVE:   DIAGNOSTIC FINDINGS:  Limited US  03/27/2024: Old degenerative tear of medial meniscus and possible partial tear of the lateral aspect. Baker's cyst with free floating soft tissue   GENERAL OBSERVATION/GAIT: Antalgic gait with reduced L knee flexion in gait  PALPATION: TTP posterior L knee  MUSCLE LENGTH: Hamstrings: Right significant restriction; Left significant restriction  LE MMT:  MMT Right (Eval) Left (Eval) R/L 8/13   Hip flexion (L2, L3)     Knee extension (L3) 4 4 4+/4+  Knee flexion 4 3+* 4/4  Hip abduction     Hip extension     Hip external rotation     Hip internal rotation     Hip adduction     Ankle dorsiflexion (L4)     Ankle plantarflexion (S1)     Ankle inversion     Ankle eversion     Great Toe ext (L5)     Grossly      (Blank rows = not tested, score listed is out of 5 possible points.  N = WNL, D = diminished, C = clear for gross weakness with myotome testing, * = concordant pain with testing)  LE ROM:  ROM Right (Eval) Left (Eval) L  Hip flexion     Hip extension  Hip abduction     Hip adduction     Hip internal rotation     Hip external rotation     Knee extension Lacking 5 Lacking 10 5 with slight OP  Knee flexion 110 90 100 AROM, 105 w/ OP  Ankle dorsiflexion     Ankle plantarflexion     Ankle inversion     Ankle eversion      (Blank rows = not tested, N = WNL, * = concordant pain with testing)  Functional Tests  Eval                                                             PATIENT SURVEYS:  LEFS: 26/80   TODAY'S TREATMENT:  [facility] Adult PT Treatment  07/03/2024:  Therapeutic Exercise: LTR Fig 4 stretch Fig 4 with rotation Supine clam isometrics Seated fig 4 stretch Education regarding stretching and mobility, updating HEP, discussion on continuing strength and walking program    HOME EXERCISE PROGRAM: Access Code: [email or web address] URL: [email or web address] Date: 07/03/2024 Prepared by: Helene Gasmen  Exercises - Recumbent Bike  - 1 x daily - 3-4 x weekly - 1 reps - 15-30 min hold - Sit to Stand Without Arm Support  - 1 x daily - 4 x weekly - 2-3 sets - 10 reps - Knee Extension with Weight Machine  - 1 x daily - 2-4 x weekly - 3 sets - 10 reps - Hamstring Curl with Weight Machine  - 1 x daily - 2-4 x weekly - 3 sets - 10 reps - Full Leg Press  - 1 x daily - 2-4 x weekly - 3 sets - 10 reps - Supine Figure 4 Piriformis  Stretch  - 1 x daily - 7 x weekly - 1 sets - 3 reps - 30'' hold - Supine Lower Trunk Rotation  - 1 x daily - 7 x weekly - 1 sets - 20 reps - 3 hold - Hooklying Isometric Clamshell  - 1 x daily - 7 x weekly - 2 sets - 10 reps  Treatment priorities   Eval                                                  ASSESSMENT:  CLINICAL IMPRESSION:  Tylynn  tolerated session well with no adverse reaction.  Pt continues to do well with strengthening program.  Attending gym.  Back to work with a few instances of L knee pain but generally doing well.  She is concerned about her hip and low back mobility so provided some stretches to improve R rotation and R hip ER.  EVAL:  Niaja  is a 58 y.o. female who presents to clinic with signs and sxs consistent with L knee stiffness.  Baker's cyst confirmed on US  with old medial meniscus tear.  Objective limited d/t late arrival and longer subjective d/t translation.  She will benefit from skilled therapy to address knee ROM and and improve ability to complete work tasks.  OBJECTIVE IMPAIRMENTS: Pain, knee flexion and ext ROM,   ACTIVITY LIMITATIONS: transfers (sitting to standing), squatting, work  PERSONAL FACTORS: See medical history and pertinent history   REHAB  POTENTIAL: Good  CLINICAL DECISION MAKING: Evolving/moderate complexity  EVALUATION COMPLEXITY: Moderate   GOALS:   SHORT TERM GOALS: Target date: 7/10   Dejuana  will be >75% HEP compliant to improve carryover between sessions and facilitate independent management of condition  Evaluation: ongoing Goal status: MET   LONG TERM GOALS: Target date: extended to 08/07/2024    Marvelyn  will achieve 110 degrees knee flexion to improve ability to complete transfers, squat, and navigate steps  Evaluation/Baseline: 90 degrees 8/13: 110 Goal status: MET    2.  Dorianna  will show a >/= 18 pt improvement in LEFS score (MCID is ~11% or 9 pts) as a proxy for functional improvement    Evaluation/Baseline: 26 pts Goal status: Ongoing   3.  Janye  will be able to work and transfer from sitting to standing, not limited by knee stiffness  Evaluation/Baseline: limited 8/13: Improving Goal status: Ongoing   4.  Hayzel  will improve the following MMTs to >/= 4+/5 to show improvement in strength:  knee flexion and ext (L)   Evaluation/Baseline: see chart in note Goal status: INITIAL   5.  Lateshia  will report confidence in self management of condition at time of discharge with advanced HEP  Evaluation/Baseline: unable to self manage Goal status: INITIAL    PLAN: PT FREQUENCY: 1-2x/week  PT DURATION: 8 weeks  PLANNED INTERVENTIONS:  97164- PT Re-evaluation, 97110-Therapeutic exercises, 97530- Therapeutic activity, 97112- Neuromuscular re-education, 97535- Self Care, 02859- Manual therapy, [id number]- Gait training, [id number]- Aquatic Therapy, [id number]- Electrical stimulation (manual), [id number]- Vasopneumatic device, [id number]- Traction (mechanical), [id number]- Ionotophoresis 4mg /ml Dexamethasone, Taping, Dry Needling, Joint manipulation, and Spinal manipulation.   Kipling Graser PT, DPT 07/03/2024, 11:18 AM

## 2024-07-22 ENCOUNTER — Other Ambulatory Visit: Payer: Self-pay

## 2024-07-29 ENCOUNTER — Other Ambulatory Visit: Payer: Self-pay

## 2024-07-31 ENCOUNTER — Other Ambulatory Visit: Payer: Self-pay

## 2024-07-31 ENCOUNTER — Ambulatory Visit: Payer: Self-pay | Attending: Family Medicine | Admitting: Physical Therapy

## 2024-08-15 ENCOUNTER — Other Ambulatory Visit: Payer: Self-pay

## 2024-08-19 ENCOUNTER — Other Ambulatory Visit: Payer: Self-pay

## 2024-08-24 ENCOUNTER — Other Ambulatory Visit: Payer: Self-pay | Admitting: Family Medicine

## 2024-09-05 ENCOUNTER — Other Ambulatory Visit: Payer: Self-pay

## 2024-09-17 ENCOUNTER — Other Ambulatory Visit: Payer: Self-pay

## 2024-09-20 ENCOUNTER — Other Ambulatory Visit: Payer: Self-pay

## 2024-11-04 ENCOUNTER — Other Ambulatory Visit: Payer: Self-pay

## 2024-11-04 DIAGNOSIS — E1165 Type 2 diabetes mellitus with hyperglycemia: Secondary | ICD-10-CM

## 2024-11-04 DIAGNOSIS — E119 Type 2 diabetes mellitus without complications: Secondary | ICD-10-CM

## 2024-11-04 MED ORDER — TRULICITY 3 MG/0.5ML ~~LOC~~ SOAJ
3.0000 mg | SUBCUTANEOUS | 3 refills | Status: AC
  Filled 2024-11-04: qty 2, 28d supply, fill #0

## 2024-11-07 ENCOUNTER — Other Ambulatory Visit: Payer: Self-pay

## 2024-11-19 ENCOUNTER — Ambulatory Visit: Payer: Self-pay | Admitting: Student

## 2024-11-19 ENCOUNTER — Ambulatory Visit
Admission: RE | Admit: 2024-11-19 | Discharge: 2024-11-19 | Disposition: A | Source: Ambulatory Visit | Attending: Family Medicine | Admitting: Family Medicine

## 2024-11-19 ENCOUNTER — Encounter: Payer: Self-pay | Admitting: Student

## 2024-11-19 VITALS — BP 145/62 | HR 70 | Ht 64.0 in | Wt 225.6 lb

## 2024-11-19 DIAGNOSIS — E1169 Type 2 diabetes mellitus with other specified complication: Secondary | ICD-10-CM

## 2024-11-19 DIAGNOSIS — M25561 Pain in right knee: Secondary | ICD-10-CM

## 2024-11-19 DIAGNOSIS — Z794 Long term (current) use of insulin: Secondary | ICD-10-CM

## 2024-11-19 DIAGNOSIS — E119 Type 2 diabetes mellitus without complications: Secondary | ICD-10-CM

## 2024-11-19 DIAGNOSIS — E669 Obesity, unspecified: Secondary | ICD-10-CM

## 2024-11-19 LAB — POCT GLYCOSYLATED HEMOGLOBIN (HGB A1C): HbA1c, POC (controlled diabetic range): 11.2 % — AB (ref 0.0–7.0)

## 2024-11-19 MED ORDER — INSULIN GLARGINE 100 UNIT/ML SOLOSTAR PEN: 15.0000 [IU] | PEN_INJECTOR | Freq: Every day | SUBCUTANEOUS | 11 refills | Status: DC

## 2024-11-19 NOTE — Patient Instructions (Addendum)
 It was great to see you today!   I have ordered a knee xray you can get this across [address] at the following address:  [facility] [address]  [phone] You do not need an appointment.    He solicitado una radiografa de rodilla que puede [employer] Engineer, en la siguiente direccin: [facility] [address]  [phone] No necesita cita previa.  You have a follow up appointment for your diabetes this Friday with our pharmacist.   Tiene una cita de seguimiento para su diabetes este viernes con nuestro farmacutico.   Future Appointments  Date Time Provider Department Center  11/22/2024  9:30 AM Koval, Peter G, RPH-CPP [facility]    Please arrive 15 minutes before your appointment to ensure smooth check in process.  If you are more than 15 minutes late, you may be asked to reschedule.   Please bring a list of your medications with you to all appointments.   Please call the clinic at [phone] if your symptoms worsen or you have any concerns.  Thank you for allowing me to participate in your care, Dr. Damien Pinal [facility]    For your pain you can try several over the counter topical medications. Topical medications are a great option to treat pain as you can place the medication right where you are hurting and they have less side effects.   Voltaren Gel/Diclofenac  Gel: this is an anti-inflammatory cream (same ingredient as Motrin /Ibuprofen /Aleve ) can be applied up to 4 times per day.     Lidocaine  patch: can be applied for 12 hours and after needs a 12 hour break to continue to be effective. Works by using numbing medication to help with pain relief. Can be found at any pharmacy over the counter or on [email or web address]    Capsaicin cream: made from the same ingredient that makes hot peppers spicy, this works by numbing up the area of pain. Please wash your hands and do not touch your eyes after applying the cream  as active ingredient can hurt your eyes.

## 2024-11-19 NOTE — Progress Notes (Signed)
" ° ° °  SUBJECTIVE:   CHIEF COMPLAINT / HPI:   Meredith  Maynard is a 59 y.o. female presenting for right knee pain.  iPad interpreter, Kyra present for encounter.   She has had right leg pain for two weeks after a work injury where she lost her balance pulling a large garbage can, fell, and struck her right leg on a work cart. She notes pain, swelling, and a palpable bump at the site. She uses topical ointment and Advil  with partial relief. Pain caused her to miss work on Thursday and Friday, and she is worried about returning.  PERTINENT  PMH / PSH: reviewed and updated.  OBJECTIVE:   BP (!) 145/62   Pulse 70   Ht 5' 4 (1.626 m)   Wt 225 lb 9.6 oz (102.3 kg)   LMP  (LMP Unknown)   SpO2 98%   BMI 38.72 kg/m   Well-appearing, no acute distress Cardio: Regular rate, regular rhythm, no murmurs on exam. Pulm: Clear, no wheezing, no crackles. No increased work of breathing Abdominal: bowel sounds present, soft, non-tender, non-distended  MSK:  Some swelling on the medial knee  Tenderness to palpation over the medial joint line  Small effusion palpated  Normal anterior and posterior drawer test  Patella palpated with some discomfort, does not appear to have abnormal mobility.    ASSESSMENT/PLAN:   Assessment & Plan Type 2 diabetes mellitus with other specified complication, with long-term current use of insulin  (HCC) Type 2 diabetes mellitus in patient with obesity (HCC) Poor glycemic control with A1c of 11.2%. 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 - Increased insulin  to 15 units daily. - Referred to clinical pharmacist for diabetes management. - Ordered BMP and lipid - Urine microalbumin ordered today Acute pain of right knee Pain and swelling in right knee post-fall. Ambulation suggests no fracture, but imaging required to confirm. - Ordered x-ray of the right knee. - Discussed topical anti-inflammatory medication. - Provided work note for today,  advised return to work on Wednesday if able. - will not be appropriate for steroid shot until A1c is better controlled.      Damien Pinal, DO [facility]  "

## 2024-11-19 NOTE — Assessment & Plan Note (Signed)
 Pain and swelling in right knee post-fall. Ambulation suggests no fracture, but imaging required to confirm. - Ordered x-ray of the right knee. - Discussed topical anti-inflammatory medication. - Provided work note for today, advised return to work on Wednesday if able. - will not be appropriate for steroid shot until A1c is better controlled.

## 2024-11-21 ENCOUNTER — Ambulatory Visit (HOSPITAL_COMMUNITY): Payer: Self-pay | Admitting: Student

## 2024-11-21 LAB — LIPID PANEL
Chol/HDL Ratio: 4.3 ratio (ref 0.0–4.4)
Cholesterol, Total: 218 mg/dL — ABNORMAL HIGH (ref 100–199)
HDL: 51 mg/dL
LDL Chol Calc (NIH): 136 mg/dL — ABNORMAL HIGH (ref 0–99)
Triglycerides: 175 mg/dL — ABNORMAL HIGH (ref 0–149)
VLDL Cholesterol Cal: 31 mg/dL (ref 5–40)

## 2024-11-21 LAB — BASIC METABOLIC PANEL WITH GFR
BUN/Creatinine Ratio: 21 (ref 9–23)
BUN: 12 mg/dL (ref 6–24)
CO2: 24 mmol/L (ref 20–29)
Calcium: 9.8 mg/dL (ref 8.7–10.2)
Chloride: 102 mmol/L (ref 96–106)
Creatinine, Ser: 0.56 mg/dL — ABNORMAL LOW (ref 0.57–1.00)
Glucose: 200 mg/dL — ABNORMAL HIGH (ref 70–99)
Potassium: 4.4 mmol/L (ref 3.5–5.2)
Sodium: 142 mmol/L (ref 134–144)
eGFR: 106 mL/min/1.73

## 2024-11-21 LAB — MICROALBUMIN / CREATININE URINE RATIO
Creatinine, Urine: 86.7 mg/dL
Microalb/Creat Ratio: 4 mg/g{creat} (ref 0–29)
Microalbumin, Urine: 3.9 ug/mL

## 2024-11-22 ENCOUNTER — Encounter: Payer: Self-pay | Admitting: Pharmacist

## 2024-11-22 ENCOUNTER — Ambulatory Visit: Payer: Self-pay | Admitting: Pharmacist

## 2024-11-22 ENCOUNTER — Other Ambulatory Visit: Payer: Self-pay

## 2024-11-22 VITALS — BP 135/75 | HR 73 | Wt 224.4 lb

## 2024-11-22 DIAGNOSIS — E669 Obesity, unspecified: Secondary | ICD-10-CM

## 2024-11-22 DIAGNOSIS — E119 Type 2 diabetes mellitus without complications: Secondary | ICD-10-CM

## 2024-11-22 MED ORDER — INSULIN GLARGINE 100 UNIT/ML SOLOSTAR PEN
18.0000 [IU] | PEN_INJECTOR | Freq: Every day | SUBCUTANEOUS | 11 refills | Status: AC
  Filled 2024-11-22: qty 6, 33d supply, fill #0

## 2024-11-22 NOTE — Progress Notes (Signed)
 "   S:     Chief Complaint  Patient presents with   Medication Management    Diabetes follow-up    59 y.o. female who presents for diabetes evaluation, education, and management. Patient arrives in good spirits and presents without any assistance. Patient is accompanied by translator, Koren.   Patient was last seen by Primary Care Provider, Dr. Cleotilde, on 11/19/2024. At last visit, Lantus  (insulin  glargine) was increased to 15 units daily.   PMH is significant for T2DM, hypertension, GERD, hyperlipidemia. Patient reports Diabetes was diagnosed in 2008.   Current diabetes medications include: Trulicity  (dulaglutide ) 3.5 mg weekly, metformin  1000 mg BID, Lantus  (insulin  glargine) 15 units daily  Current hypertension medications include: lisinopril  20 mg daily  Current hyperlipidemia medications include: ezetimibe  10 mg daily   Patient reports adherence to taking all medications as prescribed.   Reports Trulicity  (dulaglutide ) has not curbed her appetite. Feels hungry at night when she gets home from work and feels anxious which is contributing to her eating more. Reports the anxiety has started about 6 months ago. Reports it is difficult for her to fall asleep, states she only sleeps ~2 hours per night. States she feels there is a lot of stress in her life right now.   Insurance coverage: Generic commercial - reports she is applying to [employer] for Trulicity  (dulaglutide ) coverage   Patient-reported exercise habits: walks a lot at her job from 10am-6pm   O:  Review of Systems  Psychiatric/Behavioral:  The patient is nervous/anxious.   All other systems reviewed and are negative.  Physical Exam Vitals reviewed.  Constitutional:      Appearance: Normal appearance.  Pulmonary:     Effort: Pulmonary effort is normal.  Neurological:     Mental Status: She is alert.  Psychiatric:        Mood and Affect: Mood normal.        Behavior: Behavior normal.        Thought Content: Thought  content normal.        Judgment: Judgment normal.    Lab Results  Component Value Date   HGBA1C 11.2 (A) 11/19/2024   Vitals:   11/22/24 1004 11/22/24 1006  BP: (!) 141/74 135/75  Pulse: 73   SpO2: 96%    Lipid Panel     Component Value Date/Time   CHOL 218 (H) 11/19/2024 1038   TRIG 175 (H) 11/19/2024 1038   HDL 51 11/19/2024 1038   CHOLHDL 4.3 11/19/2024 1038   CHOLHDL 3.2 10/14/2016 0835   VLDL 33 (H) 10/14/2016 0835   LDLCALC 136 (H) 11/19/2024 1038   LDLDIRECT 108 (H) 03/12/2014 1002   Clinical Atherosclerotic Cardiovascular Disease (ASCVD): No  The 10-year ASCVD risk score (Arnett DK, et al., 2019) is: 8.7%   Values used to calculate the score:     Age: 67 years     Clinically relevant sex: Female     Is Non-Hispanic African American: No     Diabetic: Yes     Tobacco smoker: No     Systolic Blood Pressure: 135 mmHg     Is BP treated: Yes     HDL Cholesterol: 51 mg/dL     Total Cholesterol: 218 mg/dL  Lab Results  Component Value Date   CHOL 218 (H) 11/19/2024   HDL 51 11/19/2024   LDLCALC 136 (H) 11/19/2024   LDLDIRECT 108 (H) 03/12/2014   TRIG 175 (H) 11/19/2024   CHOLHDL 4.3 11/19/2024   Lab Results  Component Value Date   CREATININE 0.56 (L) 11/19/2024   BUN 12 11/19/2024   NA 142 11/19/2024   K 4.4 11/19/2024   CL 102 11/19/2024   CO2 24 11/19/2024   Medications Reviewed Today     Reviewed by Danuel Felicetti G, RPH-CPP (Pharmacist) on 11/22/24 at 1102  Med List Status: <None>   Medication Order Taking? Sig Documenting Provider Last Dose Status Informant  aspirin  EC 81 MG tablet [id number] Yes Take 81 mg by mouth daily. [provider]  Active            Med Note MAUDIE, MAUDE MATSU   Tue Jan 09, 2024  9:49 AM) Taking once a day  Blood Glucose Monitoring Suppl (AGAMATRIX PRESTO) w/Device KIT [id number] Yes 1 Device by Does not apply route daily. Francesco Alan BROCKS, MD  Active   cetirizine  (ZYRTEC ) 10 MG tablet [id number] Yes Take 1 tablet by  mouth once daily Pray, Margaret E, MD  Active   cholecalciferol  (VITAMIN D3) 25 MCG (1000 UNIT) tablet [id number]  Take 1 tablet (1,000 Units total) by mouth daily.  Patient not taking: Reported on 11/22/2024   McDiarmid, Krystal BIRCH, MD  Active   Dulaglutide  (TRULICITY ) 3 MG/0.5ML EMMANUEL [id number] Yes Inject 3 mg as directed once a week. Orie Milda CROME, MD  Active    Patient not taking:   Discontinued 08/14/23 0921   ezetimibe  (ZETIA ) 10 MG tablet [id number] Yes Take 1 tablet by mouth once daily McIntyre, Brittany J, MD  Active   famotidine  (PEPCID ) 20 MG tablet [id number] Yes Take 1 tablet (20 mg total) by mouth 2 (two) times daily.  Patient taking differently: Take 20 mg by mouth daily as needed.   Harl Jayson CROME, MD  Active            Med Note MAUDIE, MAUDE MATSU Schaumann Jun 13, 2019  8:52 AM) Only taking prn  ferrous sulfate  325 (65 FE) MG tablet [id number] Yes Take 1 tablet (325 mg total) by mouth every other day. McDiarmid, Krystal BIRCH, MD  Active   fluticasone  (FLONASE ) 50 MCG/ACT nasal spray [id number] Yes Place 2 sprays into both nostrils daily. McDiarmid, Krystal BIRCH, MD  Active   gabapentin  (NEURONTIN ) 300 MG capsule [id number] Yes TAKE 1 CAPSULE BY MOUTH THREE TIMES DAILY Donzetta Rollene BRAVO, MD  Active   GLUCOSAMINE HCL PO [id number] Yes Take 1 tablet by mouth daily. [provider]  Active            Med Note MAUDIE MAUDE MATSU Stevan Jul 05, 2023 10:22 AM) Takes twice daily.  glucose blood test strip [id number] Yes Use as instructed Rumball, Alison M, DO  Active   insulin  glargine (LANTUS ) 100 UNIT/ML Solostar Pen [id number]  Inject 18 Units into the skin daily. McDiarmid, Krystal BIRCH, MD  Active   Insulin  Pen Needle 31G X 8 MM MISC [id number] Yes Use as directed Elicia Hamlet, MD  Active   lisinopril  (ZESTRIL ) 20 MG tablet [id number] Yes Take 1 tablet by mouth once daily Pray, Margaret E, MD  Active   meclizine  (ANTIVERT ) 12.5 MG tablet [id number] Yes Take 1 tablet (12.5 mg total) by mouth 3 (three) times daily  as needed for dizziness. Donzetta Rollene BRAVO, MD  Active            Med Note MAUDIE, MAUDE MATSU   Fri Nov 22, 2024 10:00 AM)    metFORMIN  (GLUCOPHAGE ) 1000 MG tablet [id number] Yes TAKE 1 TABLET BY MOUTH TWICE  DAILY WITH  A  MEAL Pray, Rollene BRAVO, MD  Active   Multiple Vitamins-Minerals (MULTIVITAMIN ADULT PO) [id number] Yes Take 1 tablet by mouth daily. [provider]  Active Self  naproxen  (NAPROSYN ) 500 MG tablet [id number]  Take 1 tablet (500 mg total) by mouth 2 (two) times daily with a meal. X 5 days then PRN  Patient not taking: Reported on 03/28/2024   Bryan Bianchi, MD  Active   pantoprazole  (PROTONIX ) 40 MG tablet [id number] Yes TAKE 1 TABLET BY MOUTH ONCE DAILY. ONLY START AFTER H. PYLORI TESTING IS COMPLETED.  Patient taking differently: Take 40 mg by mouth daily as needed.   Donzetta Rollene BRAVO, MD  Active   Vitamin D , Ergocalciferol , (DRISDOL ) 1.25 MG (50000 UNIT) CAPS capsule [id number] Yes Take 1 capsule (50,000 Units total) by mouth every 7 (seven) days. McDiarmid, Krystal BIRCH, MD  Active            A/P: Diabetes longstanding currently uncontrolled, recent A1c 11.2. Patient is able to verbalize appropriate hypoglycemia management plan. Medication adherence appears good.  - Increased dose of basal insulin  Lantus  (insulin  glargine)  from 15 units to 18 units daily in the morning.  - Continued GLP-1 Trulicity  (dulaglutide ) 3.5 mg weekly. Consider switching to Ozempic (semaglutide) at next visit once patient is out of Trulicity  (dulaglutide ).  - Continued metformin  1000 mg BID.  - Patient educated on purpose, proper use, and potential adverse effects.  - Extensively discussed pathophysiology of diabetes, recommended lifestyle interventions, dietary effects on glucose control.  - Counseled on s/sx of and management of hypoglycemia.   ASCVD risk - Primary prevention in patient with diabetes. Last LDL 136 mg/dl is not at goal of <29 mg/dL. High intensity statin indicated.  - Continued  ezetimibe  10 mg daily - Investigate statin intolerance further at next visit   Hypertension longstanding uncontrolled at visit today. Blood pressure goal of <130/80 mmHg. Medication adherence good. Blood pressure control is likely suboptimal due to anxiety. - Continued lisinopril  20 mg daily.  Set up visit with PCP in ~1 week for further anxiety management.    Written patient instructions provided. Patient verbalized understanding of treatment plan.  Total time in face to face counseling 38 minutes.    Follow-up:  Pharmacist visit 12/16/2024 PCP clinic visit 12/02/2024 Patient seen with Sabra Schuller, PharmD Candidate - PY2 student and Megan McGill, PharmD Candidate - PY4 student.  "

## 2024-11-22 NOTE — Patient Instructions (Addendum)
¡  Fue un placer verte hoy!  Tu objetivo de glucosa es de 80-130 antes de comer y menos de 180 despus de comer.  Cambios en la medicacin: Aumentar la dosis de Lantus  (insulina glargina) de 15 unidades diarias a 18 unidades diarias.  Contina tomando todos los dems medicamentos como de [place].  Sigue con tus buenos hbitos de alimentacin y ejercicio. Procura llevar una dieta rica en verduras, frutas y carnes magras (pollo, pavo, pescado). Intenta limitar el consumo de sal consumiendo verduras frescas o congeladas (en lugar de enlatadas), enjuaga las verduras enlatadas antes de cocinarlas y no aadas sal a las comidas.  Controla tu glucosa en casa y lleva un registro (en un glucmetro o en una hoja de papel) para traerlo a tu prxima cita.  Por favor, trae todos tus medicamentos a las citas en la clnica. Por favor, llega 10-15 minutos antes de la hora programada para tu cita.    It was nice to see you today!  Your goal glucose value is 80-130 before eating and less than 180 after eating.  Medication Changes: Increase Lantus  (insulin  glargine) from 15 units daily to 18 units daily.   Continue all other medication the same.  Keep up the good work with diet and exercise. Aim for a diet full of vegetables, fruit and lean meats (chicken, turkey, fish). Try to limit salt intake by eating fresh or frozen vegetables (instead of canned), rinse canned vegetables prior to cooking and do not add any additional salt to meals.   Monitor your glucose at home and keep a log (glucometer or piece of paper) to bring with you to your next visit.  Please bring all medications to your clinic visits.  Please arrive 10-15 minutes prior to your scheduled visit time.

## 2024-11-22 NOTE — Assessment & Plan Note (Signed)
 Diabetes longstanding currently uncontrolled, recent A1c 11.2. Patient is able to verbalize appropriate hypoglycemia management plan. Medication adherence appears good.  - Increased dose of basal insulin  Lantus  (insulin  glargine)  from 15 units to 18 units daily in the morning.  - Continued GLP-1 Trulicity  (dulaglutide ) 3.5 mg weekly. Consider switching to Ozempic (semaglutide) at next visit once patient is out of Trulicity  (dulaglutide ).  - Continued metformin  1000 mg BID.  - Patient educated on purpose, proper use, and potential adverse effects.  - Extensively discussed pathophysiology of diabetes, recommended lifestyle interventions, dietary effects on glucose control.  - Counseled on s/sx of and management of hypoglycemia.

## 2024-11-26 NOTE — Progress Notes (Signed)
 Reviewed and agree with Dr Rennis plan.

## 2024-12-02 ENCOUNTER — Ambulatory Visit: Admitting: Family Medicine

## 2024-12-16 ENCOUNTER — Ambulatory Visit: Admitting: Pharmacist
